# Patient Record
Sex: Male | Born: 1945
Health system: Southern US, Community
[De-identification: ages and names within clinical notes are randomized; demographics above are authoritative.]

## PROBLEM LIST (undated history)

## (undated) DIAGNOSIS — I739 Peripheral vascular disease, unspecified: Secondary | ICD-10-CM

## (undated) DIAGNOSIS — J189 Pneumonia, unspecified organism: Secondary | ICD-10-CM

## (undated) DIAGNOSIS — N400 Enlarged prostate without lower urinary tract symptoms: Secondary | ICD-10-CM

## (undated) DIAGNOSIS — R911 Solitary pulmonary nodule: Secondary | ICD-10-CM

## (undated) DIAGNOSIS — I714 Abdominal aortic aneurysm, without rupture, unspecified: Secondary | ICD-10-CM

## (undated) DIAGNOSIS — Z8719 Personal history of other diseases of the digestive system: Secondary | ICD-10-CM

## (undated) DIAGNOSIS — R51 Headache: Secondary | ICD-10-CM

## (undated) DIAGNOSIS — K219 Gastro-esophageal reflux disease without esophagitis: Secondary | ICD-10-CM

## (undated) DIAGNOSIS — Z972 Presence of dental prosthetic device (complete) (partial): Secondary | ICD-10-CM

## (undated) DIAGNOSIS — G458 Other transient cerebral ischemic attacks and related syndromes: Secondary | ICD-10-CM

## (undated) DIAGNOSIS — C801 Malignant (primary) neoplasm, unspecified: Secondary | ICD-10-CM

## (undated) DIAGNOSIS — F419 Anxiety disorder, unspecified: Secondary | ICD-10-CM

## (undated) DIAGNOSIS — J439 Emphysema, unspecified: Secondary | ICD-10-CM

## (undated) DIAGNOSIS — Z8669 Personal history of other diseases of the nervous system and sense organs: Secondary | ICD-10-CM

## (undated) DIAGNOSIS — H269 Unspecified cataract: Secondary | ICD-10-CM

## (undated) DIAGNOSIS — M549 Dorsalgia, unspecified: Secondary | ICD-10-CM

## (undated) DIAGNOSIS — M109 Gout, unspecified: Secondary | ICD-10-CM

## (undated) DIAGNOSIS — R011 Cardiac murmur, unspecified: Secondary | ICD-10-CM

## (undated) DIAGNOSIS — R590 Localized enlarged lymph nodes: Secondary | ICD-10-CM

## (undated) DIAGNOSIS — J309 Allergic rhinitis, unspecified: Secondary | ICD-10-CM

## (undated) DIAGNOSIS — R519 Headache, unspecified: Secondary | ICD-10-CM

## (undated) DIAGNOSIS — I10 Essential (primary) hypertension: Secondary | ICD-10-CM

## (undated) DIAGNOSIS — J302 Other seasonal allergic rhinitis: Secondary | ICD-10-CM

## (undated) DIAGNOSIS — I2510 Atherosclerotic heart disease of native coronary artery without angina pectoris: Secondary | ICD-10-CM

## (undated) DIAGNOSIS — E785 Hyperlipidemia, unspecified: Secondary | ICD-10-CM

## (undated) HISTORY — PX: ILIAC ARTERY STENT: SHX1786

## (undated) HISTORY — DX: Allergic rhinitis, unspecified: J30.9

## (undated) HISTORY — DX: Atherosclerotic heart disease of native coronary artery without angina pectoris: I25.10

## (undated) HISTORY — PX: MULTIPLE TOOTH EXTRACTIONS: SHX2053

## (undated) HISTORY — DX: Personal history of other diseases of the nervous system and sense organs: Z86.69

## (undated) HISTORY — DX: Hyperlipidemia, unspecified: E78.5

## (undated) HISTORY — DX: Gout, unspecified: M10.9

## (undated) HISTORY — DX: Anxiety disorder, unspecified: F41.9

## (undated) HISTORY — DX: Benign prostatic hyperplasia without lower urinary tract symptoms: N40.0

## (undated) HISTORY — DX: Peripheral vascular disease, unspecified: I73.9

## (undated) HISTORY — DX: Other seasonal allergic rhinitis: J30.2

## (undated) HISTORY — PX: CARDIAC CATHETERIZATION: SHX172

## (undated) HISTORY — DX: Essential (primary) hypertension: I10

## (undated) HISTORY — DX: Abdominal aortic aneurysm, without rupture: I71.4

## (undated) HISTORY — PX: COLONOSCOPY W/ BIOPSIES AND POLYPECTOMY: SHX1376

## (undated) HISTORY — DX: Abdominal aortic aneurysm, without rupture, unspecified: I71.40

## (undated) HISTORY — DX: Dorsalgia, unspecified: M54.9

---

## 2010-10-06 LAB — LIPID PANEL
Cholesterol: 218 mg/dL — AB (ref 0–200)
HDL: 6 mg/dL — AB (ref 35–70)
LDL Cholesterol: 156 mg/dL
LDl/HDL Ratio: 5.7
Triglycerides: 119 mg/dL (ref 40–160)

## 2010-10-17 ENCOUNTER — Other Ambulatory Visit: Payer: Self-pay | Admitting: Internal Medicine

## 2010-10-17 DIAGNOSIS — R0989 Other specified symptoms and signs involving the circulatory and respiratory systems: Secondary | ICD-10-CM

## 2010-10-24 ENCOUNTER — Ambulatory Visit
Admission: RE | Admit: 2010-10-24 | Discharge: 2010-10-24 | Disposition: A | Discharge status: Home / Self Care | Payer: Medicare Other | Source: Ambulatory Visit | Attending: Internal Medicine | Admitting: Internal Medicine

## 2010-10-24 DIAGNOSIS — R0989 Other specified symptoms and signs involving the circulatory and respiratory systems: Secondary | ICD-10-CM

## 2011-02-02 ENCOUNTER — Ambulatory Visit (INDEPENDENT_AMBULATORY_CARE_PROVIDER_SITE_OTHER): Payer: Medicare Other

## 2011-02-02 DIAGNOSIS — I10 Essential (primary) hypertension: Secondary | ICD-10-CM

## 2011-02-02 DIAGNOSIS — K141 Geographic tongue: Secondary | ICD-10-CM

## 2011-02-03 LAB — BASIC METABOLIC PANEL: Sodium: 130 mmol/L — AB (ref 137–147)

## 2011-04-04 ENCOUNTER — Ambulatory Visit (INDEPENDENT_AMBULATORY_CARE_PROVIDER_SITE_OTHER): Payer: Medicare Other | Admitting: Family Medicine

## 2011-04-04 DIAGNOSIS — E871 Hypo-osmolality and hyponatremia: Secondary | ICD-10-CM

## 2011-04-04 DIAGNOSIS — R319 Hematuria, unspecified: Secondary | ICD-10-CM

## 2011-04-04 DIAGNOSIS — I10 Essential (primary) hypertension: Secondary | ICD-10-CM | POA: Diagnosis not present

## 2011-04-04 DIAGNOSIS — R350 Frequency of micturition: Secondary | ICD-10-CM

## 2011-04-04 LAB — POCT URINALYSIS DIPSTICK
Ketones, UA: NEGATIVE
Leukocytes, UA: NEGATIVE
Nitrite, UA: NEGATIVE
Protein, UA: NEGATIVE
Urobilinogen, UA: 1
pH, UA: 7.5

## 2011-04-04 MED ORDER — CHLORTHALIDONE 25 MG PO TABS: 25.0000 mg | ORAL_TABLET | Freq: Every day | ORAL | Status: DC

## 2011-04-04 MED ORDER — METOPROLOL TARTRATE 25 MG PO TABS: 25.0000 mg | ORAL_TABLET | Freq: Two times a day (BID) | ORAL | Status: DC

## 2011-04-04 NOTE — Progress Notes (Signed)
  Subjective:    Patient ID: Seth [name], male    DOB: Jun 26, 1945, 66 y.o.   MRN: [id number]  HPI Seth [name] is a 66 y.o. male Hx HTN - last OV 171/100, 178/98 on recheck. Prior on chlorthalidone 25mg  QD, added metoprolol 25mg  bid. Plan to recheck in 2-4 weeks. On last dose of meds today.  No missed doses.  Outside bp's 170-190's, then 140's/80's at times. 6-8 mugs coffee per day. Drinks 8-10 glasses water per day. Felt better with new medicine, increased coffee intake since feeling better.  No chest pain,   Hyponatremia - Na 130 on 02/02/11.  Episodic migraine headaches with excess cheese intake.  No changes recently.  Review of Systems  Respiratory: Negative for chest tightness and shortness of breath.   Cardiovascular: Negative for chest pain, palpitations and leg swelling.  Genitourinary: Positive for frequency and difficulty urinating. Negative for dysuria, urgency, hematuria, flank pain, decreased urine volume, penile pain and testicular pain.       Frequency for 1-2 weeks, but drinking more liquid and caffeine.  Trouble starting urine stream for few weeks - only at times.  Neurological: Negative for syncope, speech difficulty, weakness and light-headedness.       Objective:   Physical Exam  Constitutional: He is oriented to person, place, and time. He appears well-developed and well-nourished.  HENT:  Head: Normocephalic and atraumatic.  Cardiovascular: Normal rate, regular rhythm, normal heart sounds and intact distal pulses.   No murmur heard.      No bruit   Pulmonary/Chest: Effort normal and breath sounds normal.  Abdominal: Soft. Bowel sounds are normal. He exhibits no distension. There is no tenderness.  Neurological: He is alert and oriented to person, place, and time.  Skin: Skin is warm and dry.  Psychiatric: He has a normal mood and affect. His behavior is normal. Thought content normal.       Results for orders placed in visit on 04/04/11  POCT URINALYSIS DIPSTICK        Component Value Range   Color, UA yellow     Clarity, UA clear     Glucose, UA neg     Bilirubin, UA neg     Ketones, UA neg     Spec Grav, UA 1.010     Blood, UA trace-intact     pH, UA 7.5     Protein, UA neg     Urobilinogen, UA 1.0     Nitrite, UA neg     Leukocytes, UA Negative      Assessment & Plan:  Seth [name] is a 66 y.o. male 1. HTN (hypertension)  Basic metabolic panel  2. Urinary frequency  PSA    Suspect uncontrolled HTN in part due to excessive caffeine intake with up to 8 mugs of coffee per day, as when on lower amounts of coffee, BP in 130-140 systolics. Continue same dose meds for now, cut back coffee to no more than 2 cups per day, and continue to record home BP readings.Recheck in next 1 month.  Urinary frequency - likely in part due to caffeine as well.  However, trace blood on U/A.  Check PSA, and plan on follow up at next OV.  Hyponatremia - recheck BMP today.

## 2011-04-04 NOTE — Patient Instructions (Signed)
Cut back caffeine to no more than 2 cups per day.  Keep a record of your blood pressures out of office.  Your lab tests, including the prostate and electrolyte tests should be back in the next 10 days. Return to the clinic or go to the nearest emergency room if any of your symptoms worsen or new symptoms occur.

## 2011-04-05 LAB — BASIC METABOLIC PANEL
BUN: 6 mg/dL (ref 6–23)
Calcium: 9.8 mg/dL (ref 8.4–10.5)
Creat: 0.96 mg/dL (ref 0.50–1.35)
Glucose, Bld: 99 mg/dL (ref 70–99)
Potassium: 3.7 mEq/L (ref 3.5–5.3)

## 2011-04-26 ENCOUNTER — Encounter: Payer: Self-pay | Admitting: *Deleted

## 2011-04-26 DIAGNOSIS — J302 Other seasonal allergic rhinitis: Secondary | ICD-10-CM

## 2011-04-26 DIAGNOSIS — I10 Essential (primary) hypertension: Secondary | ICD-10-CM

## 2011-04-26 DIAGNOSIS — Z8669 Personal history of other diseases of the nervous system and sense organs: Secondary | ICD-10-CM

## 2011-04-27 ENCOUNTER — Encounter: Payer: Self-pay | Admitting: Family Medicine

## 2011-04-27 ENCOUNTER — Ambulatory Visit (INDEPENDENT_AMBULATORY_CARE_PROVIDER_SITE_OTHER): Payer: Medicare Other | Admitting: Family Medicine

## 2011-04-27 VITALS — BP 162/95 | HR 85 | Temp 96.6°F | Resp 16 | Ht 72.0 in | Wt 181.0 lb

## 2011-04-27 DIAGNOSIS — R350 Frequency of micturition: Secondary | ICD-10-CM | POA: Diagnosis not present

## 2011-04-27 DIAGNOSIS — E871 Hypo-osmolality and hyponatremia: Secondary | ICD-10-CM

## 2011-04-27 DIAGNOSIS — I10 Essential (primary) hypertension: Secondary | ICD-10-CM | POA: Diagnosis not present

## 2011-04-27 DIAGNOSIS — R319 Hematuria, unspecified: Secondary | ICD-10-CM

## 2011-04-27 MED ORDER — METOPROLOL TARTRATE 50 MG PO TABS: 50.0000 mg | ORAL_TABLET | Freq: Two times a day (BID) | ORAL | Status: DC

## 2011-04-27 NOTE — Patient Instructions (Signed)
Change metoprolol to 50 mg twice per day.  Recheck in 3 weeks.  If any increased dizziness,, return to clinic right away.

## 2011-04-27 NOTE — Progress Notes (Signed)
  Subjective:    Patient ID: Seth [name], male    DOB: 01-05-1946, 66 y.o.   MRN: [id number]  HPI  Hx HTN - 2 prior OV 171/100, 178/98 on recheck. Prior on chlorthalidone 25mg  QD, added metoprolol 25mg  bid. Outside bp's 170-190's, then 140's/80's at times.Prior  6-8 mugs coffee per day. Drinks 8-10 glasses water per day. Felt better with new medicine, increased coffee intake since feeling better. Last OV suspected uncontrolled HTN in part due to excessive caffeine intake with up to 8 mugs of coffee per day, as when on lower amounts of coffee, BP in 130-140 systolics. Continued same dose meds, and to cut back coffee to no more than 2 cups per day, and continue to record home BP readings.  Since last OV - BP's: outside numbers: 180-190/100.  Drinking 3-4 cups coffee per day now, and down to 15cigs form 2ppd smoking.  Urinary frequency - likely in part due to caffeine as well.  However, trace blood on U/A.  Check PSA, and plan on follow up at next OV.  Hyponatremia - Na 130 on 02/02/11.  Na 133 on 04/04/11.   No new symptoms.  Review of Systems  Respiratory: Negative for cough, chest tightness and shortness of breath.   Cardiovascular: Negative for chest pain and palpitations.  Genitourinary: Positive for frequency.       Less frequency than last ov.  Uop 9 times per day, but less than prior.    Neurological: Negative for dizziness and light-headedness.       One episode of dizziness with getting up quick - no increase.        Objective:   Physical Exam  Constitutional: He is oriented to person, place, and time. He appears well-developed and well-nourished.  HENT:  Head: Normocephalic and atraumatic.  Neck: No JVD present. No thyromegaly present.  Cardiovascular: Normal rate, regular rhythm, normal heart sounds and intact distal pulses.   No murmur heard. Pulmonary/Chest: Effort normal and breath sounds normal. He has no wheezes. He has no rales.  Neurological: He is alert and oriented to  person, place, and time.  Skin: Skin is warm and dry.  Psychiatric: He has a normal mood and affect.          Assessment & Plan:  Seth [name] is a 66 y.o. male 1. HTN (hypertension)  Basic metabolic panel  2. Hyponatremia  Basic metabolic panel  3. Urinary frequency  Basic metabolic panel, PSA  4. Hematuria  PSA   HTN - still uncontrolled.  Increase metoprolol to 50 mg BID - orthostatic precautions reviewed. Recheck BMP.  Urinary frequency - will check PSA with BMP today.  On diuretic for HTN, may be contributor to frequency..  Less frequent than last ov, with less caffeine intake. Recheck next few weeks, with U/a to decide further workup.  Hyponatremia - improved last ov.  Recheck BMP as above, and U/a in few weeks.  Trace hematuria - prior ov, no new urinary symptoms. Check psa, repeat urine next ov in few weeks.

## 2011-04-28 LAB — BASIC METABOLIC PANEL
BUN: 11 mg/dL (ref 6–23)
CO2: 29 mEq/L (ref 19–32)
Calcium: 9.6 mg/dL (ref 8.4–10.5)
Chloride: 96 mEq/L (ref 96–112)
Creat: 0.95 mg/dL (ref 0.50–1.35)

## 2011-05-04 ENCOUNTER — Other Ambulatory Visit: Payer: Self-pay | Admitting: Family Medicine

## 2011-05-18 ENCOUNTER — Encounter: Payer: Self-pay | Admitting: Family Medicine

## 2011-05-18 ENCOUNTER — Ambulatory Visit (INDEPENDENT_AMBULATORY_CARE_PROVIDER_SITE_OTHER): Payer: Medicare Other | Admitting: Family Medicine

## 2011-05-18 DIAGNOSIS — R350 Frequency of micturition: Secondary | ICD-10-CM

## 2011-05-18 DIAGNOSIS — R319 Hematuria, unspecified: Secondary | ICD-10-CM | POA: Diagnosis not present

## 2011-05-18 DIAGNOSIS — I10 Essential (primary) hypertension: Secondary | ICD-10-CM

## 2011-05-18 LAB — POCT UA - MICROSCOPIC ONLY
Casts, Ur, LPF, POC: NEGATIVE
Crystals, Ur, HPF, POC: NEGATIVE
Mucus, UA: NEGATIVE
Yeast, UA: NEGATIVE

## 2011-05-18 LAB — POCT URINALYSIS DIPSTICK
Ketones, UA: NEGATIVE
Protein, UA: NEGATIVE
Spec Grav, UA: 1.015
pH, UA: 7.5

## 2011-05-18 NOTE — Progress Notes (Signed)
Subjective:    Patient ID: Seth [name], male    DOB: [birth year], 66 y.o.   MRN: [id number]  HPI Seth [name] is a 66 y.o. male   HTN - still uncontrolled at last ov.  No recent outside BP's, but feels better,  Increased metoprolol to 50 mg BID at last.  No new problems with side effects with this med.  Had sinus congestion once and dizzy, but not since.  BMP wnl on 3/22 - no further hyponatremia.  Urinary frequency - PSA 2.93 at 04/27/11.  UOP 9-12 times per day.  3-4 coffee cups per day, prior increased use.. On diuretic for HTN.  LNo change in frequency since last OV.  Trace hematuria on prior U/a.  Last UTI 10 years ago.   Review of Systems  Constitutional: Negative for fatigue and unexpected weight change.  Eyes: Negative for visual disturbance.  Respiratory: Negative for cough, chest tightness and shortness of breath.   Cardiovascular: Negative for chest pain, palpitations and leg swelling.  Gastrointestinal: Negative for abdominal pain and blood in stool.  Genitourinary: Positive for frequency. Negative for hematuria and difficulty urinating.  Neurological: Negative for dizziness, light-headedness and headaches.       Objective:   Physical Exam  Constitutional: He is oriented to person, place, and time. He appears well-developed and well-nourished.  HENT:  Head: Normocephalic and atraumatic.  Eyes: EOM are normal. Pupils are equal, round, and reactive to light.  Neck: No JVD present. Carotid bruit is not present.  Cardiovascular: Normal rate, regular rhythm and normal heart sounds.   No murmur heard. Pulmonary/Chest: Effort normal and breath sounds normal. He has no rales.  Abdominal: Soft. There is no tenderness.  Musculoskeletal: He exhibits no edema.  Neurological: He is alert and oriented to person, place, and time.  Skin: Skin is warm and dry.  Psychiatric: He has a normal mood and affect.   Results for orders placed in visit on 05/18/11  POCT URINALYSIS DIPSTICK   Component Value Range   Color, UA yellow     Clarity, UA clear     Glucose, UA neg     Bilirubin, UA neg     Ketones, UA neg     Spec Grav, UA 1.015     Blood, UA trace     pH, UA 7.5     Protein, UA neg     Urobilinogen, UA 0.2     Nitrite, UA neg     Leukocytes, UA Negative    POCT UA - MICROSCOPIC ONLY      Component Value Range   WBC, Ur, HPF, POC neg     RBC, urine, microscopic 0-3     Bacteria, U Microscopic neg     Mucus, UA neg     Epithelial cells, urine per micros 0-1     Crystals, Ur, HPF, POC neg     Casts, Ur, LPF, POC neg     Yeast, UA neg            Assessment & Plan:  Seth [name] is a 66 y.o. male 1. Hematuria  POCT urinalysis dipstick, POCT UA - Microscopic Only  2. Urinary frequency  POCT urinalysis dipstick, POCT UA - Microscopic Only  3. HTN (hypertension)     HTN - improved, but still high here - check outside BP's and bring record to ov in next 3-4 weeks.  Continue same doses for now.  Urinary frequency with trace hematuria, but no apparent infection.  PSA  in normal range - suspect component of caffeine and diuretic with frequency.  Persistent trace hematuria - refer to urology - no new meds for now.  Recheck 3-4 weeks, sooner if any worsening of above.

## 2011-05-18 NOTE — Patient Instructions (Signed)
We are referring you to a urologist to evaluate the frequency and trace blood in your urine.  Return to the clinic or go to the nearest emergency room if any of your symptoms worsen or new symptoms occur.  Check your blood pressures outside of office and return with a record of these in the next 1 month. Continue same doses of meds for now.

## 2011-06-06 ENCOUNTER — Other Ambulatory Visit: Payer: Self-pay | Admitting: Physician Assistant

## 2011-06-07 DIAGNOSIS — R3129 Other microscopic hematuria: Secondary | ICD-10-CM | POA: Diagnosis not present

## 2011-06-07 DIAGNOSIS — R350 Frequency of micturition: Secondary | ICD-10-CM | POA: Diagnosis not present

## 2011-06-14 DIAGNOSIS — R3129 Other microscopic hematuria: Secondary | ICD-10-CM | POA: Diagnosis not present

## 2011-06-14 DIAGNOSIS — N289 Disorder of kidney and ureter, unspecified: Secondary | ICD-10-CM | POA: Diagnosis not present

## 2011-06-14 DIAGNOSIS — I714 Abdominal aortic aneurysm, without rupture: Secondary | ICD-10-CM | POA: Diagnosis not present

## 2011-06-18 DIAGNOSIS — R3129 Other microscopic hematuria: Secondary | ICD-10-CM | POA: Diagnosis not present

## 2011-06-22 ENCOUNTER — Encounter: Payer: Self-pay | Admitting: Family Medicine

## 2011-06-22 ENCOUNTER — Ambulatory Visit (INDEPENDENT_AMBULATORY_CARE_PROVIDER_SITE_OTHER): Payer: Medicare Other | Admitting: Family Medicine

## 2011-06-22 VITALS — BP 160/90 | HR 80 | Temp 97.8°F | Resp 16 | Ht 72.0 in | Wt 184.4 lb

## 2011-06-22 DIAGNOSIS — I10 Essential (primary) hypertension: Secondary | ICD-10-CM

## 2011-06-22 DIAGNOSIS — D509 Iron deficiency anemia, unspecified: Secondary | ICD-10-CM | POA: Diagnosis not present

## 2011-06-22 DIAGNOSIS — F17200 Nicotine dependence, unspecified, uncomplicated: Secondary | ICD-10-CM

## 2011-06-22 DIAGNOSIS — Z720 Tobacco use: Secondary | ICD-10-CM

## 2011-06-22 DIAGNOSIS — R350 Frequency of micturition: Secondary | ICD-10-CM

## 2011-06-22 MED ORDER — LISINOPRIL 5 MG PO TABS: 5.0000 mg | ORAL_TABLET | Freq: Every day | ORAL | Status: DC

## 2011-06-22 MED ORDER — CHLORTHALIDONE 25 MG PO TABS: 25.0000 mg | ORAL_TABLET | Freq: Every day | ORAL | Status: DC

## 2011-06-22 MED ORDER — METOPROLOL TARTRATE 50 MG PO TABS: 50.0000 mg | ORAL_TABLET | Freq: Two times a day (BID) | ORAL | Status: DC

## 2011-06-22 NOTE — Patient Instructions (Signed)
Start lisinopril 1 per day.  Keep record of home blood pressures.  Recheck in 6 weeks.

## 2011-06-22 NOTE — Progress Notes (Signed)
  Subjective:    Patient ID: Seth [name], male    DOB: 1945/10/25, 66 y.o.   MRN: [id number]  HPI Seth [name] is a 66 y.o. male  HTN - outside BP's 150's over 80's.  One time was 125/80.  Usually in 150's.    Hematuria with urinary frequency - referred to urology - told that the kidneys and bladder were fine, but told had aneurysm, but not concerning.  No new meds or follow up needed.   Records pending, but had ultrasound of abdomen in 9/12:  IMPRESSION:  1. Borderline/mild mid abdominal aortic dilatation/aneurysm.  2. Otherwise, normal abdominal ultrasound for age.  Is cutting back on cigarettes.     Review of Systems  Constitutional: Negative for fatigue and unexpected weight change.  Eyes: Negative for visual disturbance.  Respiratory: Negative for cough, chest tightness and shortness of breath.   Cardiovascular: Negative for chest pain, palpitations and leg swelling.  Gastrointestinal: Negative for abdominal pain and blood in stool.  Neurological: Negative for dizziness, light-headedness and headaches.  Psychiatric/Behavioral:       Hard to relax at times.  Used to do yoga.  Denies depression/SI.         Objective:   Physical Exam  Constitutional: He is oriented to person, place, and time. He appears well-developed and well-nourished.  HENT:  Head: Normocephalic and atraumatic.  Eyes: EOM are normal. Pupils are equal, round, and reactive to light.  Neck: No JVD present. Carotid bruit is not present.  Cardiovascular: Normal rate, regular rhythm and normal heart sounds.   No murmur heard. Pulmonary/Chest: Effort normal and breath sounds normal. He has no rales.  Abdominal: Normal appearance. He exhibits pulsatile midline mass. There is no tenderness. There is no CVA tenderness.       Able to palpate abdominal pulse but no lateral deviation.  Musculoskeletal: He exhibits no edema.  Neurological: He is alert and oriented to person, place, and time.  Skin: Skin is warm and dry.    Psychiatric: He has a normal mood and affect.       Assessment & Plan:  Seth [name] is a 66 y.o. male HTN - uncontrolled with tobacco abuse and recent diagnosis of abdominal aneurysm, prior mild.  Will obtain records to decide if further workup/imaging needed.  Reinforced need for strict BP control and cessation of tobacco.  Continue metoprolol 50mg  BID, chlorthalidone 25mg  QD, add lisinopril 5mg  QD - may need higher dose. If new cough - call or rtc.  SED.  orthostatic precautions.  Anxiety/stress.  Stress mgt and relaxation techniques.discussed.  Can restart yoga, can read book "Anxiety for Dummies".  Discuss in further detail at next ov.  Urinary frequency - likely med related per urology.  No new workup needed at this point.  Tobacco abuse - continue to cut back.  If needs help with cessation, consider Chantix.  REcheck next 6 weeks.  Sooner if any new se's or new sx's.  Understanding expressed.

## 2011-08-03 ENCOUNTER — Encounter: Payer: Self-pay | Admitting: Family Medicine

## 2011-08-03 ENCOUNTER — Ambulatory Visit (INDEPENDENT_AMBULATORY_CARE_PROVIDER_SITE_OTHER): Payer: Medicare Other | Admitting: Family Medicine

## 2011-08-03 VITALS — BP 154/83 | HR 87 | Temp 96.4°F | Resp 16 | Ht 72.0 in | Wt 185.2 lb

## 2011-08-03 DIAGNOSIS — F411 Generalized anxiety disorder: Secondary | ICD-10-CM

## 2011-08-03 DIAGNOSIS — I10 Essential (primary) hypertension: Secondary | ICD-10-CM | POA: Diagnosis not present

## 2011-08-03 DIAGNOSIS — F419 Anxiety disorder, unspecified: Secondary | ICD-10-CM

## 2011-08-03 LAB — BASIC METABOLIC PANEL
BUN: 11 mg/dL (ref 6–23)
Chloride: 90 mEq/L — ABNORMAL LOW (ref 96–112)
Potassium: 3.9 mEq/L (ref 3.5–5.3)
Sodium: 128 mEq/L — ABNORMAL LOW (ref 135–145)

## 2011-08-03 MED ORDER — LISINOPRIL 10 MG PO TABS: 10.0000 mg | ORAL_TABLET | Freq: Every day | ORAL | Status: DC

## 2011-08-03 MED ORDER — METOPROLOL TARTRATE 50 MG PO TABS: 50.0000 mg | ORAL_TABLET | Freq: Two times a day (BID) | ORAL | Status: DC

## 2011-08-03 MED ORDER — CHLORTHALIDONE 25 MG PO TABS: 25.0000 mg | ORAL_TABLET | Freq: Every day | ORAL | Status: DC

## 2011-08-03 NOTE — Progress Notes (Signed)
Subjective:    Patient ID: Seth [name], male    DOB: 09/21/1945, 66 y.o.   MRN: [id number]  HPI Seth [name] is a 66 y.o. male See prior ov's - 06/22/11 Continued metoprolol 50mg  BID, chlorthalidone 25mg  QD, added lisinopril 5mg  QD - Outside blood pressures -141-153/80-86. Feels better.  No cough or other new side effects of meds.  Dx with possible small aortic aneurysm at urology - but records not available at present.   Stress/anxiety - see last ov.  Trying breathing and yoga techniques - seems to be helping some.  Has not picked up Anxiety for Dummies book. Denies depression/SI  Review of Systems  Constitutional: Negative for fatigue and unexpected weight change.  Eyes: Negative for visual disturbance.  Respiratory: Negative for cough, chest tightness and shortness of breath.   Cardiovascular: Negative for chest pain, palpitations and leg swelling.  Gastrointestinal: Negative for abdominal pain and blood in stool.  Neurological: Positive for dizziness. Negative for light-headedness and headaches.       "pre-dizziness - thinks may be sinuses"  Psychiatric/Behavioral: Positive for disturbed wake/sleep cycle. Negative for suicidal ideas. The patient is nervous/anxious.        Sleeps 6 hours per night. Usual amount for him. Sometimes more if tired.  Trouble staying asleep at times, not anxious or nervous - just awake.          Objective:   Physical Exam  Constitutional: He is oriented to person, place, and time. He appears well-developed and well-nourished.  HENT:  Head: Normocephalic and atraumatic.  Eyes: EOM are normal. Pupils are equal, round, and reactive to light.  Neck: No JVD present. Carotid bruit is not present.  Cardiovascular: Normal rate, regular rhythm and normal heart sounds.   No murmur heard. Pulmonary/Chest: Effort normal and breath sounds normal. He has no rales.  Abdominal: Soft. Normal appearance. He exhibits no abdominal bruit, no pulsatile midline mass and no mass.  There is no tenderness. There is no CVA tenderness.  Musculoskeletal: He exhibits no edema.  Neurological: He is alert and oriented to person, place, and time.  Skin: Skin is warm and dry.  Psychiatric: He has a normal mood and affect.     Abdominal Ultrasound report - 10/17/10: excerpt on aneurysm:  Abdominal aorta: Atherosclerotic irregularity within. Mild  aneurysmal dilatation of the mid abdominal aorta. This measures  maximally 3.0 cm on image 70 longitudinal. 3.2 cm transverse on  image 9. Iliacs appear grossly normal. The distal abdominal aorta  is normal in caliber, at 2.1 cm.       Assessment & Plan:   Seth [name] is a 66 y.o. male  HTN - improving control, but not yet at goal. Increase lisinopril to 10mg  qd.  Cont other meds at current doses and check home bp's.   Abdominal aortic aneurysm - small, asymptomatic.  No indication for repair at this size (less than 5.5cm), but will plant to recheck ultrasound in 3 months to watch for expansion.  If greater than 10mm per year, would then consider repair.  Will need to quit smoking as primary intervention, and for cardiovascular risk reduction - plan to start asa qd, as well as possible statin as LDL elevated last year - due for another lipid panel.  This can be at next office visit in next 6 weeks. As far as exercise - light to moderate is ok, but will need to avoid any heavy lifting causing valsalva and subsequent rise in pressure.  Anxiety - stable.  Declined other meds at this point,  Has Klonopin prn if needed.  No recent use needed.  Will look into book Anxiety for Dummies. Exercise recommended.- low intensity for now - walking, or in pool  (hx of back problems).

## 2011-08-03 NOTE — Patient Instructions (Addendum)
Increase lisinopril to 10mg  each day (take 2 of 5mg  pills until out, then fill new 10mg  prescription).  If dizziness increases - return to previous dose and return to clinic. Low intensity exercise only until we determine if further imaging or follow up necessary for the aneurysm.  Return to the clinic or go to the nearest emergency room if any of your symptoms worsen or new symptoms occur. Can try over the counter claritin for allergies if needed.  No decongestants.  If anxiety is worse - return to clinic.

## 2011-08-20 ENCOUNTER — Other Ambulatory Visit: Payer: Self-pay | Admitting: Family Medicine

## 2011-08-20 ENCOUNTER — Other Ambulatory Visit: Payer: Medicare Other

## 2011-08-20 DIAGNOSIS — I10 Essential (primary) hypertension: Secondary | ICD-10-CM

## 2011-08-20 LAB — BASIC METABOLIC PANEL
BUN: 11 mg/dL (ref 6–23)
CO2: 26 mEq/L (ref 19–32)
Glucose, Bld: 96 mg/dL (ref 70–99)
Potassium: 4.1 mEq/L (ref 3.5–5.3)
Sodium: 130 mEq/L — ABNORMAL LOW (ref 135–145)

## 2011-09-14 ENCOUNTER — Encounter: Payer: Self-pay | Admitting: Family Medicine

## 2011-09-14 ENCOUNTER — Ambulatory Visit (INDEPENDENT_AMBULATORY_CARE_PROVIDER_SITE_OTHER): Payer: Medicare Other | Admitting: Family Medicine

## 2011-09-14 VITALS — BP 138/74 | HR 74 | Temp 97.8°F | Resp 16 | Ht 72.0 in | Wt 188.0 lb

## 2011-09-14 DIAGNOSIS — E871 Hypo-osmolality and hyponatremia: Secondary | ICD-10-CM | POA: Diagnosis not present

## 2011-09-14 DIAGNOSIS — I10 Essential (primary) hypertension: Secondary | ICD-10-CM

## 2011-09-14 DIAGNOSIS — I739 Peripheral vascular disease, unspecified: Secondary | ICD-10-CM

## 2011-09-14 DIAGNOSIS — G629 Polyneuropathy, unspecified: Secondary | ICD-10-CM

## 2011-09-14 DIAGNOSIS — M79669 Pain in unspecified lower leg: Secondary | ICD-10-CM

## 2011-09-14 DIAGNOSIS — F411 Generalized anxiety disorder: Secondary | ICD-10-CM | POA: Diagnosis not present

## 2011-09-14 DIAGNOSIS — F419 Anxiety disorder, unspecified: Secondary | ICD-10-CM

## 2011-09-14 LAB — BASIC METABOLIC PANEL
BUN: 14 mg/dL (ref 6–23)
CO2: 27 mEq/L (ref 19–32)
Calcium: 9.9 mg/dL (ref 8.4–10.5)
Creat: 1.1 mg/dL (ref 0.50–1.35)
Glucose, Bld: 97 mg/dL (ref 70–99)
Sodium: 131 mEq/L — ABNORMAL LOW (ref 135–145)

## 2011-09-14 NOTE — Patient Instructions (Signed)
Keep a record of your blood pressures outside of the office and bring them to the next office visit. Your should receive a call or letter about your lab results within the next week to 10 days.   Recheck in next few weeks.  Return to the clinic or go to the nearest emergency room if any of your symptoms worsen or new symptoms occur.

## 2011-09-14 NOTE — Progress Notes (Signed)
Subjective:    Patient ID: Seth [name], male    DOB: 25-May-1945, 66 y.o.   MRN: [id number]  HPI Seth [name] is a 66 y.o. male HTN - improving control, but not yet at goal at last ov. Increased lisinopril to 10mg  qd at 08/03/11 ov.  Continued  other meds at current doses and check home bp's. Sodium 130, creat 0.99 on last labs 08/20/11. Here for follow up. Has not checked outside blood pressures. Occasional feeling of "predizziness" - feels like may become dizzy, but no actual chest pain.  2 episodes past 6 weeks. Sometimes fatigue after dinner at times. Only 3-4 cups coffee each day.  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  Seen by neurosurgeon in [place], in 1996?Marland Kitchen  No recent eval. Hx of congenital back problem? Seen by urgent care in [place].   -  Feeling more tightness in calves with walking distances. There for years, but worsening over the years.  Cutting back on smoking.    Abdominal aortic aneurysm  -Abdominal Ultrasound report - 10/17/10: excerpt on aneurysm:  Abdominal aorta: Atherosclerotic irregularity within. Mild aneurysmal dilatation of the mid abdominal aorta. This measures maximally 3.0 cm on image 70 longitudinal. 3.2 cm transverse on  image 9. Iliacs appear grossly normal. The distal abdominal aorta is normal in caliber, at 2.1 cm.   see last ov. - small, asymptomatic.  No indication for repair at this size (less than 5.5cm), but will plant to recheck ultrasound in 3 months to watch for expansion.  If greater than 10mm per year, would then consider repair.  Will need to quit smoking as primary intervention, and for cardiovascular risk reduction - plan to start asa qd, as well as possible statin as LDL elevated last year - due for another lipid panel.  As far as exercise - light to moderate is ok, but will need to avoid any heavy lifting causing valsalva and subsequent rise in pressure.  Anxiety -  Has Klonopin prn if needed.  No recent use needed.  recommended book  Anxiety for Dummies. Downloaded it, but has not read it. Exercise recommended.- low intensity for now - walking, or in pool  (hx of back problems). Not bad past 6 weeks.  Not having to use klonopin.  Overall feels pretty good.    Review of Systems  Constitutional: Negative for fatigue (sleepy at night at times. ) and unexpected weight change.  Eyes: Negative for visual disturbance.  Respiratory: Negative for cough, chest tightness and shortness of breath.   Cardiovascular: Negative for chest pain, palpitations and leg swelling.  Gastrointestinal: Negative for abdominal pain and blood in stool.  Musculoskeletal: Positive for myalgias (calves - see hpi.).  Neurological: Negative for dizziness (see hpi. ), syncope, light-headedness and headaches.       Objective:   Physical Exam  Constitutional: He is oriented to person, place, and time. He appears well-developed and well-nourished.  HENT:  Head: Normocephalic and atraumatic.  Eyes: EOM are normal. Pupils are equal, round, and reactive to light.  Neck: No JVD present. Carotid bruit is not present.  Cardiovascular: Normal rate, regular rhythm and normal heart sounds.   No murmur heard. Pulmonary/Chest: Effort normal and breath sounds normal. He has no rales.  Abdominal: He exhibits pulsatile midline mass (palpable aortic pulse, but no lateral deviation. ).  Musculoskeletal: He exhibits no edema.  Neurological: He is alert and oriented to person, place, and time.  Skin: Skin is warm and dry.  Psychiatric: He has a normal mood and affect.        Assessment & Plan:  Seth [name] is a 66 y.o. male  Calf pain with walking, ? claudication vs underlying spinal stenosis? Need to obtain old records as not evaluated here. Strongly encourage tobacco cessation - and applauded on efforts cutting back, as this may be contributing if claudication.  Recheck at ov in few weeks to discuss prior workup/tx and possible abi's as progressive symptoms.    HTN  - improved control, but "pre dizziness".  Check outside BP's, especially during these times, and may need to adjust meds.  Orthostatic precautions. Plan on repeat ultrasound in next 1-2 months.   Hyponatremia - possibly med related - recheck today, and if persists, may need to adjust type of antihypertensive to see if this is the cause.   Hx small abdominal aortic aneurysm.  Asymptomatic.  Precautions again reviewed.  Anxiety - stable. No new changes.

## 2011-09-18 ENCOUNTER — Telehealth: Payer: Self-pay | Admitting: *Deleted

## 2011-09-18 NOTE — Telephone Encounter (Signed)
Called at 1155 spoke to patient, advised him Dr Channing Mutters does not have any records of his office visit. Patient will check to see if he can locate any information of seeing Dr Channing Mutters, but he said he saw this provider located in [place] in 1996 or 1997.

## 2011-10-05 ENCOUNTER — Ambulatory Visit: Payer: Medicare Other

## 2011-10-05 ENCOUNTER — Encounter: Payer: Self-pay | Admitting: Family Medicine

## 2011-10-05 ENCOUNTER — Ambulatory Visit (INDEPENDENT_AMBULATORY_CARE_PROVIDER_SITE_OTHER): Payer: Medicare Other | Admitting: Family Medicine

## 2011-10-05 VITALS — BP 130/80 | HR 88 | Temp 97.7°F | Resp 16 | Ht 72.0 in | Wt 188.0 lb

## 2011-10-05 DIAGNOSIS — R209 Unspecified disturbances of skin sensation: Secondary | ICD-10-CM | POA: Diagnosis not present

## 2011-10-05 DIAGNOSIS — I10 Essential (primary) hypertension: Secondary | ICD-10-CM

## 2011-10-05 DIAGNOSIS — M4317 Spondylolisthesis, lumbosacral region: Secondary | ICD-10-CM

## 2011-10-05 DIAGNOSIS — E870 Hyperosmolality and hypernatremia: Secondary | ICD-10-CM

## 2011-10-05 DIAGNOSIS — M545 Low back pain: Secondary | ICD-10-CM

## 2011-10-05 DIAGNOSIS — I739 Peripheral vascular disease, unspecified: Secondary | ICD-10-CM | POA: Diagnosis not present

## 2011-10-05 DIAGNOSIS — R202 Paresthesia of skin: Secondary | ICD-10-CM

## 2011-10-05 DIAGNOSIS — F17200 Nicotine dependence, unspecified, uncomplicated: Secondary | ICD-10-CM

## 2011-10-05 MED ORDER — METOPROLOL TARTRATE 50 MG PO TABS: ORAL_TABLET | ORAL | Status: DC

## 2011-10-05 NOTE — Progress Notes (Signed)
Subjective:    Patient ID: Seth [name], male    DOB: 12-26-1945, 66 y.o.   MRN: [id number]  HPI Seth [name] is a 66 y.o. male  Last ov 09/14/11.  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  Seen by neurosurgeon in [place], in 1996?Marland Kitchen  No recent eval. Hx of congenital back problem? Seen by urgent care in [place] in 1990's. .  At last ov  -  feeling more tightness in calves with walking distances. There for years, but worsening over the years.  Cutting back on smoking. Possible claudication vs underlying spinal stenosis discussed at last ov. Prior eval in [place], but unable to obtain records. Discussed continued cutting back on smoking for possible claudication component. Occasional soreness in back, not interfering with normal activity, but notes pain and numbness in calves both sides after walking 1000 feet.    HTN - improved control - see prior ov's but persistent hyponatremia.  possibly med related - recheck on 09/14/11 - 130 to 131.   Plan on repeat ultrasound in next 1 month for AAA - small abdominal aortic aneurysm. Asymptomatic.  Home blood pressure readings on his machine much higher than here.  195/94, 170/72. Only occasional "pre dizzy spell" - but no true dizziness, No loc.  Has not checked bp at those times. Did have an illness with diarrhea few days ago  -  Feels ok now, no further diarrhea.   Smoking 13-15 cigarettes per day.  No bowel or bladder incontinence, no saddle anesthesia, no lower extremity weakness, except as above.     Review of Systems  Constitutional: Negative for fatigue and unexpected weight change.  Eyes: Negative for visual disturbance.  Respiratory: Negative for cough, chest tightness and shortness of breath.   Cardiovascular: Negative for chest pain, palpitations and leg swelling.  Gastrointestinal: Positive for diarrhea (see hpi.). Negative for abdominal pain and blood in stool.  Neurological: Negative for dizziness, light-headedness and  headaches.       Objective:   Physical Exam  Constitutional: He is oriented to person, place, and time.  Neurological: He is alert and oriented to person, place, and time.  Skin:         [facility] reading (PRIMARY) by  Dr. Neva Seat: LS spine 2 view.  Spondylolisthesis of L5 on S1 with posterior collapse or narrowing of L5. DDD.   Official reading: IMPRESSION: No acute osseous abnormality. Grade II spondylolisthesis of L5 on S1 approximating 16 mm, due to bilateral L5 pars defects as noted on prior CT. Severe degenerative disc disease at L5-S1 and mild degenerative disc disease at L4-5.     Assessment & Plan:  Seth [name] is a 65 y.o. male  HTN - suspect machine is giving falsely elevated readings - bring machine to next office visit.  Change in meds as below.   Hyponatremia - trial off chlorthalidone. Increase metoprolol to 1 and 1/2 pills twice per day.  Orthostatic precautions discussed - if this occurs - check BP and return to prior dose of 50mg  BID. Recheck levels in approx 1 month.  If Na still low, consider ACE-I as cause.   Calf pain, numbness - claudication from PVD vs neuronal impingement/spinal stenosis at LS spine, with underlying listhesis on LS xray. May need MRI for further eval of possible spinal stenosis and ortho eval.   Chronic areas of decreased sensation in lower legs, but increase in activity related symptoms in calf. Decreased hair distribution suggestive of peripheral vascular dz.  Will checlk ABI's and discuss further at next OV.   Plan on recheck in 1 month.  Hx of AAA - can schedule repeat ultrasound then.

## 2011-10-05 NOTE — Patient Instructions (Signed)
Stop chlorthalidone.  Increase metoprolol 50mg  to 1 and 1/2 pills twice per day.  Watch for any lightheadedness or dizziness with this change.  Bring your blood pressure machine to the next office visit in 1 month.  We will call you to schedule the study of the arteries, and can talk more about the numbness and calf pain at next office visit, but continue to decrease smoking as this will likely help.   Return to the clinic or go to the nearest emergency room if any of your symptoms worsen or new symptoms occur.

## 2011-10-12 ENCOUNTER — Ambulatory Visit: Payer: Medicare Other | Admitting: Family Medicine

## 2011-10-23 ENCOUNTER — Telehealth: Payer: Self-pay

## 2011-10-23 ENCOUNTER — Ambulatory Visit (HOSPITAL_COMMUNITY)
Admission: RE | Admit: 2011-10-23 | Discharge: 2011-10-23 | Disposition: A | Discharge status: Home / Self Care | Payer: Medicare Other | Source: Ambulatory Visit | Attending: Family Medicine | Admitting: Family Medicine

## 2011-10-23 DIAGNOSIS — I743 Embolism and thrombosis of arteries of the lower extremities: Secondary | ICD-10-CM | POA: Insufficient documentation

## 2011-10-23 DIAGNOSIS — I739 Peripheral vascular disease, unspecified: Secondary | ICD-10-CM | POA: Diagnosis not present

## 2011-10-23 DIAGNOSIS — I10 Essential (primary) hypertension: Secondary | ICD-10-CM | POA: Insufficient documentation

## 2011-10-23 DIAGNOSIS — F17200 Nicotine dependence, unspecified, uncomplicated: Secondary | ICD-10-CM | POA: Insufficient documentation

## 2011-10-23 DIAGNOSIS — R252 Cramp and spasm: Secondary | ICD-10-CM | POA: Insufficient documentation

## 2011-10-23 NOTE — Progress Notes (Signed)
*  PRELIMINARY RESULTS* Vascular Ultrasound Lower extremity arterial duplex and ABI have been completed.    Right= Monophasic common femoral artery, suggestive of Aortoiliac disease. Occluded femoral artery with recanalized flow seen at the level of distal popliteal artery and dampened monophasic flow distally. Left= Monophasic common femoral artery, suggestive of Aortoiliac disease. Occluded femoral artery with recanalized flow seen at the level of distal femoral artery and dampened monophasic flow distally.   Left brachial pressure is dramatically lower than the right brachial pressure. Consistent with proximal left stenosis, possibly left subclavian.   RIGHT    LEFT    PRESSURE WAVEFORM  PRESSURE WAVEFORM  BRACHIAL 188 Tri  BRACHIAL 130 Mono  DP   DP    AT 72 Damp mono AT 70 Damp mono  PT 73 Damp mono PT 60 Damp mono  PER   PER    GREAT TOE  NA GREAT TOE  NA    RIGHT LEFT  ABI 0.39 0.37     Farrel Demark, RDMS, RVT  10/23/2011, 3:28 PM

## 2011-10-23 NOTE — Telephone Encounter (Signed)
Seth [name] from [facility] called to report bilateral ABI results were "severe for arterial disease". She asked whether Dr Neva Seat wants to do a Duplex US to check level/location of disease. Since Dr Neva Seat is not in office, checked w/Heather who OK'd order for this test w/results to Dr Neva Seat. Sent order in to [facility] and called her back to verify she received.

## 2011-11-02 ENCOUNTER — Encounter: Payer: Self-pay | Admitting: Family Medicine

## 2011-11-02 ENCOUNTER — Ambulatory Visit (INDEPENDENT_AMBULATORY_CARE_PROVIDER_SITE_OTHER): Payer: Medicare Other | Admitting: Family Medicine

## 2011-11-02 VITALS — BP 142/75 | HR 84 | Temp 98.0°F | Resp 16 | Ht 72.0 in | Wt 184.0 lb

## 2011-11-02 DIAGNOSIS — E871 Hypo-osmolality and hyponatremia: Secondary | ICD-10-CM

## 2011-11-02 DIAGNOSIS — I739 Peripheral vascular disease, unspecified: Secondary | ICD-10-CM

## 2011-11-02 DIAGNOSIS — I10 Essential (primary) hypertension: Secondary | ICD-10-CM | POA: Diagnosis not present

## 2011-11-02 DIAGNOSIS — M79609 Pain in unspecified limb: Secondary | ICD-10-CM | POA: Diagnosis not present

## 2011-11-02 DIAGNOSIS — I714 Abdominal aortic aneurysm, without rupture: Secondary | ICD-10-CM

## 2011-11-02 NOTE — Progress Notes (Signed)
Subjective:    Patient ID: Seth [name], male    DOB: 1945/05/05, 66 y.o.   MRN: [id number]  HPI Seth [name] is a 66 y.o. male  HTN - suspected machine was giving falsely elevated readings.  Has new machine, but did not bring to office visit.  Blood pressure lower in L arm when had vascular study. (130 vs 188).  Feels better off chlorthalidone. No low blood pressures. Home BP 150 -180 in R arm at home.  Checked in r arm today in office.  Now on metoprolol 50mg  1 and 1/2 BID. No new side effects. Feels less lethargic.   Hyponatremia - as above.   Calf pain, numbness - claudication from PVD vs neuronal impingement/spinal stenosis at LS spine, with underlying listhesis on LS xray. Lower extremity arterial duplex and ABI have been completed: Right= Monophasic common femoral artery, suggestive of Aortoiliac disease. Occluded femoral artery with recanalized flow seen at the level of distal popliteal artery and dampened monophasic flow distally. Left= Monophasic common femoral artery, suggestive of Aortoiliac disease. Occluded femoral artery with recanalized flow seen at the level of distal femoral artery and dampened monophasic flow distally. Left brachial pressure is dramatically lower than the right brachial pressure. Consistent with proximal left stenosis, possibly left subclavian. Continues to cut back on tobacco.  12- 13 cigarettes per day.    Hx of AAA - can schedule repeat ultrasound at this point. 3.0 by 3.2cm in 9/12. No new abd pain.   Review of Systems  Constitutional: Negative for fatigue and unexpected weight change.  Eyes: Negative for visual disturbance.  Respiratory: Positive for cough (slight with sinus drainage.  ). Negative for chest tightness and shortness of breath.   Cardiovascular: Negative for chest pain, palpitations and leg swelling.  Gastrointestinal: Negative for abdominal pain and blood in stool.  Neurological: Negative for dizziness, light-headedness and headaches.      Objective:   Physical Exam  Constitutional: He is oriented to person, place, and time. He appears well-developed and well-nourished.  HENT:  Head: Normocephalic and atraumatic.  Eyes: EOM are normal. Pupils are equal, round, and reactive to light.  Neck: No JVD present. Carotid bruit is not present.  Cardiovascular: Normal rate, regular rhythm and normal heart sounds.   No murmur heard. Pulmonary/Chest: Effort normal and breath sounds normal. He has no rales.  Abdominal: Soft. Normal appearance. He exhibits no pulsatile midline mass.  Musculoskeletal: He exhibits no edema.  Neurological: He is alert and oriented to person, place, and time.  Skin: Skin is warm and dry.  Psychiatric: He has a normal mood and affect.       Assessment & Plan:  Seth [name] is a 65 y.o. male 1. HTN (hypertension)  Basic metabolic panel  2. Hyponatremia  Basic metabolic panel  3. Peripheral vascular disease  Ambulatory referral to Vascular Surgery, [place] Art/Ven Flow Abd Pelv Doppler  4. Claudication  Ambulatory referral to Vascular Surgery  5. Abdominal aortic aneurysm  [place] Art/Ven Flow Abd Pelv Doppler    PVD - as above.  Needs to see vascular surgery in [place] due to insurance reasons. Will refer to vascular surgeon.   Hyponatremia - Rechecking BMP If Na still low, consider ACE-I as cause. Feels better with coming off chlorthalidone  HTN - borderline - encouraged to bring meter to next ov. Cont  Current meds.  Abdominal aortic aneurysm - scheduling repeat ultrasound. If greater than 10mm increase in size per year, or greater than 5.5 cm would  then consider repair.  Continuing to work on tobacco cessation.  Exercise precautions discussed prior.     Patient Instructions  We will call you to schedule the abdominal ultrasound and referral to vascular surgeon.  Recheck with your blood pressure meter in the next 4-6 weeks.  Your should receive a call or letter about your lab results within the next  week to 10 days.

## 2011-11-02 NOTE — Patient Instructions (Signed)
We will call you to schedule the abdominal ultrasound and referral to vascular surgeon.  Recheck with your blood pressure meter in the next 4-6 weeks.  Your should receive a call or letter about your lab results within the next week to 10 days.

## 2011-11-03 LAB — BASIC METABOLIC PANEL
CO2: 22 mEq/L (ref 19–32)
Chloride: 102 mEq/L (ref 96–112)
Creat: 1.01 mg/dL (ref 0.50–1.35)
Potassium: 4.2 mEq/L (ref 3.5–5.3)

## 2011-11-05 ENCOUNTER — Telehealth: Payer: Self-pay | Admitting: Radiology

## 2011-11-05 DIAGNOSIS — I714 Abdominal aortic aneurysm, without rupture: Secondary | ICD-10-CM

## 2011-11-05 NOTE — Telephone Encounter (Signed)
Corrected order to aorta US per referral dept/

## 2011-11-08 ENCOUNTER — Ambulatory Visit
Admission: RE | Admit: 2011-11-08 | Discharge: 2011-11-08 | Disposition: A | Discharge status: Home / Self Care | Payer: Medicare Other | Source: Ambulatory Visit | Attending: Family Medicine | Admitting: Family Medicine

## 2011-11-08 DIAGNOSIS — I714 Abdominal aortic aneurysm, without rupture: Secondary | ICD-10-CM

## 2011-11-26 DIAGNOSIS — I70209 Unspecified atherosclerosis of native arteries of extremities, unspecified extremity: Secondary | ICD-10-CM | POA: Diagnosis not present

## 2011-11-26 DIAGNOSIS — I714 Abdominal aortic aneurysm, without rupture: Secondary | ICD-10-CM | POA: Diagnosis not present

## 2011-11-26 DIAGNOSIS — I10 Essential (primary) hypertension: Secondary | ICD-10-CM | POA: Diagnosis not present

## 2011-11-26 DIAGNOSIS — R0989 Other specified symptoms and signs involving the circulatory and respiratory systems: Secondary | ICD-10-CM | POA: Diagnosis not present

## 2011-11-26 DIAGNOSIS — F17200 Nicotine dependence, unspecified, uncomplicated: Secondary | ICD-10-CM | POA: Diagnosis not present

## 2011-12-10 ENCOUNTER — Ambulatory Visit (INDEPENDENT_AMBULATORY_CARE_PROVIDER_SITE_OTHER): Payer: Medicare Other | Admitting: Family Medicine

## 2011-12-10 ENCOUNTER — Encounter: Payer: Self-pay | Admitting: Family Medicine

## 2011-12-10 VITALS — BP 130/80 | HR 70 | Temp 98.0°F | Resp 16 | Ht 72.0 in | Wt 182.5 lb

## 2011-12-10 DIAGNOSIS — Z720 Tobacco use: Secondary | ICD-10-CM

## 2011-12-10 DIAGNOSIS — F17200 Nicotine dependence, unspecified, uncomplicated: Secondary | ICD-10-CM | POA: Diagnosis not present

## 2011-12-10 DIAGNOSIS — I10 Essential (primary) hypertension: Secondary | ICD-10-CM | POA: Diagnosis not present

## 2011-12-10 DIAGNOSIS — I714 Abdominal aortic aneurysm, without rupture: Secondary | ICD-10-CM

## 2011-12-10 DIAGNOSIS — Z8249 Family history of ischemic heart disease and other diseases of the circulatory system: Secondary | ICD-10-CM

## 2011-12-10 DIAGNOSIS — I739 Peripheral vascular disease, unspecified: Secondary | ICD-10-CM | POA: Diagnosis not present

## 2011-12-10 NOTE — Patient Instructions (Signed)
We will refer you to a heart doctor to discuss stress testing or other possible evaluation given your peripheral vascular disease and brother with bypass grafting. If you have any chest pain go to an emergency room or call 911.  Return to the clinic or go to the nearest emergency room if any of your symptoms worsen or new symptoms occur.  Look up info on Chantix and smoking cessation here:To look up more info on your condition, go to the website [email or web address], then on patient resources - select UPTODATE. Under patient resources, select tobacco cessation.  If you would like to start this medicine - I can call this in for you.   Your should receive a call or letter about your lab results within the next week to 10 days. Follow up with me in the next 6 weeks.   Follow up with your vascular surgeon as planned. Continue your medicines as previously prescribed.

## 2011-12-10 NOTE — Progress Notes (Signed)
Subjective:    Patient ID: Seth [name], male    DOB: 1945-04-02, 66 y.o.   MRN: [id number]  HPI Seth [name] is a 66 y.o. male See last ov - 9/27.  Here for follow up.  PVD - referred to vascular surgeon in [facility]. Dr. Sondra Come.  OV few weeks ago. Planning on repeating some tests in December.  Thinking will need surgery.  May be stents or other surgery.   Hyponatremia - resolved. Feels better.  Results for orders placed in visit on 11/02/11  BASIC METABOLIC PANEL      Component Value Range   Sodium 136  135 - 145 mEq/L   Potassium 4.2  3.5 - 5.3 mEq/L   Chloride 102  96 - 112 mEq/L   CO2 22  19 - 32 mEq/L   Glucose, Bld 94  70 - 99 mg/dL   BUN 8  6 - 23 mg/dL   Creat 4.40  1.02 - 7.25 mg/dL   Calcium 9.4  8.4 - 36.6 mg/dL   HTN - borderline prior. No recent outside BP's.  No new side effects with meds.   Abdominal aortic aneurysm --per reviewed recommendations -  If greater than 10mm increase in size per year, or greater than 5.5 cm would then consider repair. Also plan to continue to work on tobacco cessation.  Exercise precautions discussed prior.  9/13 ultrasound: Comparison: CT of the abdomen and pelvis dated 06/14/2011 at  [facility] and prior abdominal ultrasound dated 10/24/2010.  Abdominal Aorta: Fusiform infrarenal abdominal aortic aneurysm  again identified. Morphology is similar to the prior CT with mural  thrombus and atherosclerotic plaque again identified.  Maximum AP diameter: 3.8 - 3.9 cm.  Maximum TRV diameter: 3.5 - 3.6 cm.  IMPRESSION:  Infrarenal abdominal aortic aneurysm with similar dimensions to the  prior CT in May.  Discussed continue monitoring q year.  Smoking 13-16 cigarettes per day. Wants to stop smoking.  Thinking about electronic cigarettes   Younger brother with CABG recently.     Review of Systems  Constitutional: Negative for fatigue and unexpected weight change.  Eyes: Negative for visual disturbance.  Respiratory: Negative for  cough, chest tightness and shortness of breath.   Cardiovascular: Negative for chest pain, palpitations and leg swelling.  Gastrointestinal: Negative for abdominal pain and blood in stool.  Neurological: Negative for dizziness, light-headedness and headaches.       Objective:   Physical Exam  Vitals reviewed. Constitutional: He is oriented to person, place, and time. He appears well-developed and well-nourished.  HENT:  Head: Normocephalic and atraumatic.  Eyes: EOM are normal. Pupils are equal, round, and reactive to light.  Neck: No JVD present. Carotid bruit is not present.  Cardiovascular: Normal rate, regular rhythm and normal heart sounds.   No murmur heard. Pulmonary/Chest: Effort normal and breath sounds normal. He has no rales.  Abdominal: He exhibits pulsatile midline mass (palpable abd aorta, but no lateral deviation. ). There is no tenderness.  Musculoskeletal: He exhibits no edema.  Neurological: He is alert and oriented to person, place, and time.  Skin: Skin is warm and dry.  Psychiatric: He has a normal mood and affect. His behavior is normal.      Assessment & Plan:  Seth [name] is a 66 y.o. male 1. HTN (hypertension)  Lipid panel, Comprehensive metabolic panel, Ambulatory referral to Cardiology  2. AAA (abdominal aortic aneurysm)  Lipid panel, Ambulatory referral to Cardiology  3. PVD (peripheral vascular disease)  Lipid panel,  Comprehensive metabolic panel, Ambulatory referral to Cardiology  4. Tobacco abuse  Comprehensive metabolic panel, Ambulatory referral to Cardiology  5. FH: CAD (coronary artery disease)  Lipid panel, Comprehensive metabolic panel, Ambulatory referral to Cardiology    Tobacco abuse - discussed combined risks including PVD, HTN, and risks with aneurysm.   Discussed Chantix.  Uptodate link given. Not ready to start this yet, but can call me if he would like to start this and we can call in rx.  Mood and psychiatric precautions discussed.    Abdominal aortic aneurysm- discussed overall stability, but again importance of tobacco cessation. Continue to monitor in next 1 year.  This was apparently discussed with his vascular surgeon at last office visit.   HTN - stable.  No change in meds for now  PVD - follow up with surgeon planned. New hx of younger brother with CABG. Will refer to cardiology for eval for stress testing. Lipids pending for further risk stratification.   Recheck in 6 weeks, rtc precautions.

## 2011-12-11 LAB — LIPID PANEL: HDL: 36 mg/dL — ABNORMAL LOW (ref >39)

## 2011-12-11 LAB — COMPREHENSIVE METABOLIC PANEL
CO2: 27 mEq/L (ref 19–32)
Calcium: 9.5 mg/dL (ref 8.4–10.5)
Glucose, Bld: 93 mg/dL (ref 70–99)
Potassium: 4.6 mEq/L (ref 3.5–5.3)
Sodium: 135 mEq/L (ref 135–145)
Total Bilirubin: 0.4 mg/dL (ref 0.3–1.2)
Total Protein: 7.5 g/dL (ref 6.0–8.3)

## 2011-12-11 NOTE — Progress Notes (Signed)
Six week f-up appt made with Dr. Neva Seat for 12/16. Seth [name]

## 2011-12-14 ENCOUNTER — Other Ambulatory Visit: Payer: Self-pay | Admitting: Family Medicine

## 2011-12-14 DIAGNOSIS — E785 Hyperlipidemia, unspecified: Secondary | ICD-10-CM

## 2011-12-14 MED ORDER — ROSUVASTATIN CALCIUM 10 MG PO TABS: 10.0000 mg | ORAL_TABLET | Freq: Every day | ORAL | Status: DC

## 2012-01-10 DIAGNOSIS — M79609 Pain in unspecified limb: Secondary | ICD-10-CM | POA: Diagnosis not present

## 2012-01-10 DIAGNOSIS — I714 Abdominal aortic aneurysm, without rupture: Secondary | ICD-10-CM | POA: Diagnosis not present

## 2012-01-10 DIAGNOSIS — R0989 Other specified symptoms and signs involving the circulatory and respiratory systems: Secondary | ICD-10-CM | POA: Diagnosis not present

## 2012-01-10 DIAGNOSIS — I70299 Other atherosclerosis of native arteries of extremities, unspecified extremity: Secondary | ICD-10-CM | POA: Diagnosis not present

## 2012-01-10 DIAGNOSIS — I70209 Unspecified atherosclerosis of native arteries of extremities, unspecified extremity: Secondary | ICD-10-CM | POA: Diagnosis not present

## 2012-01-10 DIAGNOSIS — I6529 Occlusion and stenosis of unspecified carotid artery: Secondary | ICD-10-CM | POA: Diagnosis not present

## 2012-01-21 ENCOUNTER — Encounter: Payer: Self-pay | Admitting: Family Medicine

## 2012-01-21 ENCOUNTER — Ambulatory Visit (INDEPENDENT_AMBULATORY_CARE_PROVIDER_SITE_OTHER): Payer: Medicare Other | Admitting: Family Medicine

## 2012-01-21 VITALS — BP 128/88 | HR 68 | Temp 98.2°F | Resp 16 | Ht 72.0 in | Wt 183.0 lb

## 2012-01-21 DIAGNOSIS — Z720 Tobacco use: Secondary | ICD-10-CM

## 2012-01-21 DIAGNOSIS — Z8249 Family history of ischemic heart disease and other diseases of the circulatory system: Secondary | ICD-10-CM

## 2012-01-21 DIAGNOSIS — F17200 Nicotine dependence, unspecified, uncomplicated: Secondary | ICD-10-CM

## 2012-01-21 DIAGNOSIS — I10 Essential (primary) hypertension: Secondary | ICD-10-CM

## 2012-01-21 DIAGNOSIS — E785 Hyperlipidemia, unspecified: Secondary | ICD-10-CM

## 2012-01-21 DIAGNOSIS — I739 Peripheral vascular disease, unspecified: Secondary | ICD-10-CM

## 2012-01-21 MED ORDER — METOPROLOL TARTRATE 50 MG PO TABS: ORAL_TABLET | ORAL | Status: DC

## 2012-01-21 MED ORDER — LISINOPRIL 10 MG PO TABS: 10.0000 mg | ORAL_TABLET | Freq: Every day | ORAL | Status: DC

## 2012-01-21 MED ORDER — ROSUVASTATIN CALCIUM 10 MG PO TABS: 10.0000 mg | ORAL_TABLET | Freq: Every day | ORAL | Status: DC

## 2012-01-21 NOTE — Progress Notes (Signed)
Subjective:    Patient ID: Seth [name], male    DOB: 06-29-1945, 66 y.o.   MRN: [id number]  HPI Seth [name] is a 66 y.o. male Hx of AAA, PVD, tobacco abuse, HTN.  Here for follow up.   Tobacco abuse - see last ov.  discussed combined risks including PVD, HTN, and risks with aneurysm.   Discussed Chantix.  Uptodate link given. Not ready for cessation at that OV.   Abdominal aortic aneurysm- overall stable, plan on routine follow up, and tob abuse discussed prior.  HTN - stable.  No change in meds for now  PVD -  New hx of younger brother with CABG at last office visit. Planned on referral to cardiology for eval for stress testing - pt to call and let us know which provider his brother saw.  Lipids last office visit.   S/p visit with vascular surgeon. No significant carotid stenosis. 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 Planning on stents January 13th for the pelvic arteries in office.  May still need to have bypass in the legs, but pelvic arteries intial concern.   Results for orders placed in visit on 12/10/11  LIPID PANEL      Component Value Range   Cholesterol 218 (*) 0 - 200 mg/dL   Triglycerides 119  <147 mg/dL   HDL 36 (*) >82 mg/dL   Total CHOL/HDL Ratio 6.1     VLDL 21  0 - 40 mg/dL   LDL Cholesterol 956 (*) 0 - 99 mg/dL  COMPREHENSIVE METABOLIC PANEL      Component Value Range   Sodium 135  135 - 145 mEq/L   Potassium 4.6  3.5 - 5.3 mEq/L   Chloride 101  96 - 112 mEq/L   CO2 27  19 - 32 mEq/L   Glucose, Bld 93  70 - 99 mg/dL   BUN 8  6 - 23 mg/dL   Creat 2.13  0.86 - 5.78 mg/dL   Total Bilirubin 0.4  0.3 - 1.2 mg/dL   Alkaline Phosphatase 108  39 - 117 U/L   AST 13  0 - 37 U/L   ALT 9  0 - 53 U/L   Total Protein 7.5  6.0 - 8.3 g/dL   Albumin 4.5  3.5 - 5.2 g/dL   Calcium 9.5  8.4 - 46.9 mg/dL   On Crestor for past month- taking at night, no new side effects. Tolerating this without difficulty.    Review of Systems  Constitutional: Negative for  fatigue and unexpected weight change.  Eyes: Negative for visual disturbance.  Respiratory: Negative for cough, chest tightness and shortness of breath.   Cardiovascular: Negative for chest pain, palpitations and leg swelling.  Gastrointestinal: Negative for abdominal pain and blood in stool.  Neurological: Negative for dizziness, light-headedness and headaches.       Objective:   Physical Exam  Vitals reviewed. Constitutional: He is oriented to person, place, and time. He appears well-developed and well-nourished.  HENT:  Head: Normocephalic and atraumatic.  Eyes: EOM are normal. Pupils are equal, round, and reactive to light.  Neck: No JVD present. Carotid bruit is not present.  Cardiovascular: Normal rate, regular rhythm and normal heart sounds.   No murmur heard. Pulmonary/Chest: Effort normal and breath sounds normal. He has no rales.  Abdominal: He exhibits no mass. There is no tenderness.  Musculoskeletal: He exhibits no edema.  Neurological: He is alert and oriented to person, place, and time.  Skin: Skin  is warm and dry.  Psychiatric: He has a normal mood and affect. His behavior is normal.        Assessment & Plan:  [name] is a 66 y.o. male 1. HTN (hypertension)  lisinopril (PRINIVIL,ZESTRIL) 10 MG tablet, metoprolol (LOPRESSOR) 50 MG tablet  2. Hyperlipidemia  Comprehensive metabolic panel, Lipid panel, rosuvastatin (CRESTOR) 10 MG tablet  3. PVD (peripheral vascular disease)  Comprehensive metabolic panel, Lipid panel  4. Family history of cardiac disorder    5. Tobacco abuse     PVD and FH of CAD/CABG in brother- had not called in yet with name of cardiologist where brother seen, but will do this in next few days.  Referral status is pending.   HTN- recheck BP on left is ok.  Likely component of PVD. No changes in meds for now.    Tobacco abuse - has cut back to 10-12 cigarettes each day.  Encouraged on continued efforts to decrease.   Hyperlipidemia -  tolerating statin.  Lab only orders in next 2-3 weeks. meds refilled.   Recheck in 3 months.  Patient Instructions  Call our office and advise [place] about the name of your cardiologist you would like to see.  Continue same meds for now, but come into the office for lab visit only after January 1st. Your should receive a call or letter about your lab results within the next week to 10 days after drawn.  Continue to cut back on cigarettes and let me know if you would like a chantix prescription to help with this.

## 2012-01-21 NOTE — Patient Instructions (Signed)
Call our office and advise [place] about the name of your cardiologist you would like to see.  Continue same meds for now, but come into the office for lab visit only after January 1st. Your should receive a call or letter about your lab results within the next week to 10 days after drawn.  Continue to cut back on cigarettes and let me know if you would like a chantix prescription to help with this.

## 2012-02-13 DIAGNOSIS — I10 Essential (primary) hypertension: Secondary | ICD-10-CM | POA: Diagnosis not present

## 2012-02-13 DIAGNOSIS — I70209 Unspecified atherosclerosis of native arteries of extremities, unspecified extremity: Secondary | ICD-10-CM | POA: Diagnosis not present

## 2012-02-18 DIAGNOSIS — I70219 Atherosclerosis of native arteries of extremities with intermittent claudication, unspecified extremity: Secondary | ICD-10-CM | POA: Diagnosis not present

## 2012-02-20 DIAGNOSIS — Z0389 Encounter for observation for other suspected diseases and conditions ruled out: Secondary | ICD-10-CM | POA: Diagnosis not present

## 2012-02-20 DIAGNOSIS — I739 Peripheral vascular disease, unspecified: Secondary | ICD-10-CM | POA: Diagnosis not present

## 2012-02-20 DIAGNOSIS — I714 Abdominal aortic aneurysm, without rupture: Secondary | ICD-10-CM | POA: Diagnosis not present

## 2012-02-20 DIAGNOSIS — F17200 Nicotine dependence, unspecified, uncomplicated: Secondary | ICD-10-CM | POA: Diagnosis not present

## 2012-02-20 DIAGNOSIS — Z8249 Family history of ischemic heart disease and other diseases of the circulatory system: Secondary | ICD-10-CM | POA: Diagnosis not present

## 2012-02-20 DIAGNOSIS — E782 Mixed hyperlipidemia: Secondary | ICD-10-CM | POA: Diagnosis not present

## 2012-03-06 DIAGNOSIS — R0602 Shortness of breath: Secondary | ICD-10-CM | POA: Diagnosis not present

## 2012-03-06 DIAGNOSIS — F17200 Nicotine dependence, unspecified, uncomplicated: Secondary | ICD-10-CM | POA: Diagnosis not present

## 2012-03-06 DIAGNOSIS — I714 Abdominal aortic aneurysm, without rupture: Secondary | ICD-10-CM | POA: Diagnosis not present

## 2012-03-06 DIAGNOSIS — Z8249 Family history of ischemic heart disease and other diseases of the circulatory system: Secondary | ICD-10-CM | POA: Diagnosis not present

## 2012-03-06 DIAGNOSIS — E782 Mixed hyperlipidemia: Secondary | ICD-10-CM | POA: Diagnosis not present

## 2012-03-06 DIAGNOSIS — Z0389 Encounter for observation for other suspected diseases and conditions ruled out: Secondary | ICD-10-CM | POA: Diagnosis not present

## 2012-03-06 DIAGNOSIS — M79609 Pain in unspecified limb: Secondary | ICD-10-CM | POA: Diagnosis not present

## 2012-03-06 DIAGNOSIS — I739 Peripheral vascular disease, unspecified: Secondary | ICD-10-CM | POA: Diagnosis not present

## 2012-03-08 ENCOUNTER — Ambulatory Visit (INDEPENDENT_AMBULATORY_CARE_PROVIDER_SITE_OTHER): Payer: Medicare Other | Admitting: Family Medicine

## 2012-03-08 VITALS — BP 158/98 | HR 70 | Temp 97.9°F | Resp 20 | Ht 72.5 in | Wt 179.6 lb

## 2012-03-08 DIAGNOSIS — I10 Essential (primary) hypertension: Secondary | ICD-10-CM | POA: Diagnosis not present

## 2012-03-08 DIAGNOSIS — L509 Urticaria, unspecified: Secondary | ICD-10-CM | POA: Diagnosis not present

## 2012-03-08 NOTE — Patient Instructions (Signed)
Take Zyrtec one daily Take Zantac 75 milligrams one twice daily  If rash gets worse please return, or go to the emergency room if abruptly worse  Find out the name of the agent that was given to you at the scan.  Hives Hives are itchy, red, swollen areas of the skin. They can vary in size and location on your body. Hives can come and go for hours or several days (acute hives) or for several weeks (chronic hives). Hives do not spread from person to person (noncontagious). They may get worse with scratching, exercise, and emotional stress. CAUSES   Allergic reaction to food, additives, or drugs.  Infections, including the common cold.  Illness, such as vasculitis, lupus, or thyroid disease.  Exposure to sunlight, heat, or cold.  Exercise.  Stress.  Contact with chemicals. SYMPTOMS   Red or white swollen patches on the skin. The patches may change size, shape, and location quickly and repeatedly.  Itching.  Swelling of the hands, feet, and face. This may occur if hives develop deeper in the skin. DIAGNOSIS  Your caregiver can usually tell what is wrong by performing a physical exam. Skin or blood tests may also be done to determine the cause of your hives. In some cases, the cause cannot be determined. TREATMENT  Mild cases usually get better with medicines such as antihistamines. Severe cases may require an emergency epinephrine injection. If the cause of your hives is known, treatment includes avoiding that trigger.  HOME CARE INSTRUCTIONS   Avoid causes that trigger your hives.  Take antihistamines as directed by your caregiver to reduce the severity of your hives. Non-sedating or low-sedating antihistamines are usually recommended. Do not drive while taking an antihistamine.  Take any other medicines prescribed for itching as directed by your caregiver.  Wear loose-fitting clothing.  Keep all follow-up appointments as directed by your caregiver. SEEK MEDICAL CARE IF:     You have persistent or severe itching that is not relieved with medicine.  You have painful or swollen joints. SEEK IMMEDIATE MEDICAL CARE IF:   You have a fever.  Your tongue or lips are swollen.  You have trouble breathing or swallowing.  You feel tightness in the throat or chest.  You have abdominal pain. These problems may be the first sign of a life-threatening allergic reaction. Call your local emergency services (911 in [place] MAKE SURE YOU:   Understand these instructions.  Will watch your condition.  Will get help right away if you are not doing well or get worse. Document Released: 01/22/2005 Document Revised: 07/24/2011 Document Reviewed: 04/17/2011 [facility] Patient Information 2013 [place], [place].

## 2012-03-08 NOTE — Progress Notes (Signed)
Subjective: Patient is here with history of having had a nuclear medicine arterial scan done in [place] on Thursday. He was given a injected contrast dye. On Friday he noticed a rash splotchy on his trunk. It had both itching and hurting. Did not take anything else or change any medications.  Objective: He says his blood pressure was fine at the doctor's office a few days ago. I did note that he was a little high today.  He has a splotchy erythematous rash on his trunk, primarily on his upper chest a couple placed on his back.  There are no other lesions. Chest is clear to auscultation. Neck supple without nodes.  Assessment: Urticaria Hypertension Probable contrast dye allergy  Plan: He needs to find out what was given him for the scan to document it.  Treat with antihistamines. He has a problem with taking steroids can breaks out in a rash. Therefore I did not attempt to steroids on him.

## 2012-03-12 DIAGNOSIS — E782 Mixed hyperlipidemia: Secondary | ICD-10-CM | POA: Diagnosis not present

## 2012-03-12 DIAGNOSIS — R9439 Abnormal result of other cardiovascular function study: Secondary | ICD-10-CM | POA: Diagnosis not present

## 2012-03-12 DIAGNOSIS — Z8249 Family history of ischemic heart disease and other diseases of the circulatory system: Secondary | ICD-10-CM | POA: Diagnosis not present

## 2012-03-12 DIAGNOSIS — I714 Abdominal aortic aneurysm, without rupture: Secondary | ICD-10-CM | POA: Diagnosis not present

## 2012-03-12 DIAGNOSIS — I739 Peripheral vascular disease, unspecified: Secondary | ICD-10-CM | POA: Diagnosis not present

## 2012-03-12 DIAGNOSIS — F17200 Nicotine dependence, unspecified, uncomplicated: Secondary | ICD-10-CM | POA: Diagnosis not present

## 2012-03-13 ENCOUNTER — Ambulatory Visit (INDEPENDENT_AMBULATORY_CARE_PROVIDER_SITE_OTHER): Payer: Medicare Other | Admitting: Emergency Medicine

## 2012-03-13 VITALS — BP 170/98 | HR 102 | Temp 100.3°F | Resp 20 | Ht 74.0 in | Wt 183.0 lb

## 2012-03-13 DIAGNOSIS — J111 Influenza due to unidentified influenza virus with other respiratory manifestations: Secondary | ICD-10-CM

## 2012-03-13 MED ORDER — HYDROCOD POLST-CHLORPHEN POLST 10-8 MG/5ML PO LQCR: 5.0000 mL | Freq: Two times a day (BID) | ORAL | Status: DC | PRN

## 2012-03-13 MED ORDER — OSELTAMIVIR PHOSPHATE 75 MG PO CAPS: 75.0000 mg | ORAL_CAPSULE | Freq: Two times a day (BID) | ORAL | Status: DC

## 2012-03-13 NOTE — Patient Instructions (Addendum)

## 2012-03-13 NOTE — Progress Notes (Signed)
[facility] [address], [place] [address] [phone] [phone]  Date:  03/13/2012   Name:  Seth [name]   DOB:  February 12, 1945   MRN:  [id number]  PCP:  Provider Not In System    Chief Complaint: Cough, sneezing, Fever and Chills   History of Present Illness:  Seth [name] is a 67 y.o. very pleasant male patient who presents with the following:  Ill with sudden onset yesterday of a cough that is rather severe and persistent and nonproductive. Has fatigue, malaise, myalgias and fever and chills.  Has mucoid nasal drainage and post nasal drip.  No wheezing or shortness of breath.  So ill that he cannot smoke.  No nausea or vomiting. No stool change or rash.  No improvement with OTC medication.  Wife ill at home.  Patient Active Problem List  Diagnosis  . HTN (hypertension)  . History of sciatica  . Essential hypertension, benign  . Seasonal allergies  . Abdominal aortic aneurysm    Past Medical History  Diagnosis Date  . History of sciatica   . Essential hypertension, benign   . Seasonal allergies   . Back pain     No past surgical history on file.  History  Substance Use Topics  . Smoking status: Current Every Day Smoker -- 0.5 packs/day for 50 years    Types: Cigarettes  . Smokeless tobacco: Not on file  . Alcohol Use: Yes     Comment: sometimes mixed drinks or beers    Family History  Problem Relation Age of Onset  . Hypertension Mother   . Gout Mother     Allergies  Allergen Reactions  . Biaxin (Clarithromycin) Shortness Of Breath  . Clarithromycin Shortness Of Breath  . Cortisone Other (See Comments)    Turns red from breast up  . Iodine Hives    Dye from nuclear medicine test  . Prednisone     Medication list has been reviewed and updated.  Current Outpatient Prescriptions on File Prior to Visit  Medication Sig Dispense Refill  . aspirin EC 81 MG tablet Take 81 mg by mouth daily.      . clopidogrel (PLAVIX) 75 MG tablet Take 75 mg by  mouth daily.      Marland Kitchen lisinopril (PRINIVIL,ZESTRIL) 10 MG tablet Take 1 tablet (10 mg total) by mouth daily.  90 tablet  1  . metoprolol (LOPRESSOR) 50 MG tablet Take 1 and 1/2 tablets by mouth twice per day.  180 tablet  1  . rosuvastatin (CRESTOR) 10 MG tablet Take 1 tablet (10 mg total) by mouth daily.  30 tablet  1    Review of Systems:  As per HPI, otherwise negative.    Physical Examination: Filed Vitals:   03/13/12 1751  BP: 170/98  Pulse: 102  Temp: 100.3 F (37.9 C)  Resp: 20   Filed Vitals:   03/13/12 1751  Height: 6\' 2"  (1.88 m)  Weight: 183 lb (83.008 kg)   Body mass index is 23.50 kg/(m^2). Ideal Body Weight: Weight in (lb) to have BMI = 25: 194.3   GEN: WDWN, NAD, Non-toxic, A & O x 3 HEENT: Atraumatic, Normocephalic. Neck supple. No masses, No LAD. Ears and Nose: No external deformity. CV: RRR, No M/G/R. No JVD. No thrill. No extra heart sounds. PULM: CTA B, no wheezes, crackles, rhonchi. No retractions. No resp. distress. No accessory muscle use. ABD: S, NT, ND, +BS. No rebound. No HSM. EXTR: No c/c/e NEURO Normal gait.  PSYCH: Normally interactive. Conversant. Not depressed or anxious appearing.  Calm demeanor.    Assessment and Plan: Influenza tussionex tamiflu   Carmelina Dane, MD

## 2012-03-17 DIAGNOSIS — I739 Peripheral vascular disease, unspecified: Secondary | ICD-10-CM | POA: Diagnosis not present

## 2012-03-17 DIAGNOSIS — I714 Abdominal aortic aneurysm, without rupture: Secondary | ICD-10-CM | POA: Diagnosis not present

## 2012-03-17 DIAGNOSIS — I70209 Unspecified atherosclerosis of native arteries of extremities, unspecified extremity: Secondary | ICD-10-CM | POA: Diagnosis not present

## 2012-03-24 ENCOUNTER — Ambulatory Visit (INDEPENDENT_AMBULATORY_CARE_PROVIDER_SITE_OTHER): Payer: Medicare Other | Admitting: Family Medicine

## 2012-03-24 ENCOUNTER — Encounter: Payer: Self-pay | Admitting: Family Medicine

## 2012-03-24 VITALS — BP 132/80 | HR 98 | Temp 97.9°F | Resp 16 | Ht 72.0 in | Wt 166.8 lb

## 2012-03-24 DIAGNOSIS — I10 Essential (primary) hypertension: Secondary | ICD-10-CM

## 2012-03-24 DIAGNOSIS — R9439 Abnormal result of other cardiovascular function study: Secondary | ICD-10-CM

## 2012-03-24 DIAGNOSIS — Z720 Tobacco use: Secondary | ICD-10-CM

## 2012-03-24 DIAGNOSIS — R05 Cough: Secondary | ICD-10-CM

## 2012-03-24 DIAGNOSIS — I739 Peripheral vascular disease, unspecified: Secondary | ICD-10-CM

## 2012-03-24 DIAGNOSIS — F17200 Nicotine dependence, unspecified, uncomplicated: Secondary | ICD-10-CM

## 2012-03-24 DIAGNOSIS — E785 Hyperlipidemia, unspecified: Secondary | ICD-10-CM

## 2012-03-24 DIAGNOSIS — J09X2 Influenza due to identified novel influenza A virus with other respiratory manifestations: Secondary | ICD-10-CM

## 2012-03-24 MED ORDER — HYDROCOD POLST-CHLORPHEN POLST 10-8 MG/5ML PO LQCR: 5.0000 mL | Freq: Two times a day (BID) | ORAL | Status: DC | PRN

## 2012-03-24 MED ORDER — ROSUVASTATIN CALCIUM 10 MG PO TABS: 10.0000 mg | ORAL_TABLET | Freq: Every day | ORAL | Status: DC

## 2012-03-24 MED ORDER — BENZONATATE 100 MG PO CAPS: 100.0000 mg | ORAL_CAPSULE | Freq: Three times a day (TID) | ORAL | Status: DC | PRN

## 2012-03-24 MED ORDER — METOPROLOL TARTRATE 50 MG PO TABS: ORAL_TABLET | ORAL | Status: DC

## 2012-03-24 MED ORDER — LISINOPRIL 10 MG PO TABS: 10.0000 mg | ORAL_TABLET | Freq: Every day | ORAL | Status: DC

## 2012-03-24 NOTE — Progress Notes (Signed)
Subjective:    Patient ID: Seth [name], male    DOB: 09-10-1945, 67 y.o.   MRN: [id number]  HPI Seth [name] is a 67 y.o. male multiple concerns today.   Recently seen 03/13/12 with influenza. Had not taken flu vaccine. Treated with tamiflu - finished. Felt bad or about 8 days.  Feeling a little better past few days. Less appetite. No recent fever. Occasional cough attacks still few times per day. Taking tussionex at night. Helps with sleep. No meds for cough during the day.   HTN - did see Dr Dione Housekeeper with [facility], had a stress test last month - abnormal stress test - discussed in office with cardiologist, and planning on heart cath in next month. FH of CABG in younger brother. Taking meds without difficulty, taking plavix and asa.   Hyperlipidemia.  Results for orders placed in visit on 12/10/11  LIPID PANEL      Result Value Range   Cholesterol 218 (*) 0 - 200 mg/dL   Triglycerides 409  <811 mg/dL   HDL 36 (*) >91 mg/dL   Total CHOL/HDL Ratio 6.1     VLDL 21  0 - 40 mg/dL   LDL Cholesterol 478 (*) 0 - 99 mg/dL  COMPREHENSIVE METABOLIC PANEL      Result Value Range   Sodium 135  135 - 145 mEq/L   Potassium 4.6  3.5 - 5.3 mEq/L   Chloride 101  96 - 112 mEq/L   CO2 27  19 - 32 mEq/L   Glucose, Bld 93  70 - 99 mg/dL   BUN 8  6 - 23 mg/dL   Creat 2.95  6.21 - 3.08 mg/dL   Total Bilirubin 0.4  0.3 - 1.2 mg/dL   Alkaline Phosphatase 108  39 - 117 U/L   AST 13  0 - 37 U/L   ALT 9  0 - 53 U/L   Total Protein 7.5  6.0 - 8.3 g/dL   Albumin 4.5  3.5 - 5.2 g/dL   Calcium 9.5  8.4 - 65.7 mg/dL   Started on Crestor 10mg  in November.  Unable to get fasting labs due to mixup, but plans on coming in this week.  Not fasting in office.   PVD - see prior visit. S/p visit with vascular surgeon. but pelvic arteries 90% blocked. Stents January 13th for the pelvic arteries in office. Had follow up last week with PA - had doppler - told had better blood flow. Plans on follow up in 6  months.  tobacco abuse - cut back with recent illness - 5- 6 cigs per day. Plans on quitting on birthday.  04/25/12. Plans to try without meds.    Review of Systems  Constitutional: Negative for fever, chills, fatigue and unexpected weight change.  Eyes: Negative for visual disturbance.  Respiratory: Positive for cough. Negative for chest tightness and shortness of breath.   Cardiovascular: Negative for chest pain, palpitations and leg swelling.  Gastrointestinal: Negative for abdominal pain and blood in stool.  Neurological: Negative for dizziness, light-headedness and headaches.        Objective:   Physical Exam  Vitals reviewed. Constitutional: He is oriented to person, place, and time. He appears well-developed and well-nourished.  HENT:  Head: Normocephalic and atraumatic.  Eyes: EOM are normal. Pupils are equal, round, and reactive to light.  Neck: No JVD present. Carotid bruit is not present.  Cardiovascular: Normal rate, regular rhythm and normal heart sounds.   No murmur heard. Pulmonary/Chest:  Effort normal and breath sounds normal. No respiratory distress. He has no wheezes. He has no rales.  Musculoskeletal: He exhibits no edema.  Neurological: He is alert and oriented to person, place, and time.  Skin: Skin is warm and dry.  Psychiatric: He has a normal mood and affect. His behavior is normal.      Assessment & Plan:  Seth [name] is a 67 y.o. male HTN (hypertension) - stable. Plan: lisinopril (PRINIVIL,ZESTRIL) 10 MG tablet, metoprolol (LOPRESSOR) 50 MG tablet refilled. Follow up for cath with cardiology as planned. Rtc/er cp precautions.   Hyperlipidemia - Plan: rosuvastatin (CRESTOR) 10 MG tablet  Influenza due to identified novel influenza A virus with other respiratory manifestations  Cough -  Post influenza. Plan: benzonatate (TESSALON) 100 MG capsule rx or otc mucinex prn,  chlorpheniramine-HYDROcodone (TUSSIONEX PENNKINETIC ER) 10-8 MG/5ML LQCR - at night  only if needed, and taper off as able. rtc if any increased cough or fever noted.   PVD (peripheral vascular disease). Stable, improved after stenting.  Cont plavix/asa as instructed by vascular surgeon.  Abnormal stress test - as above - planning on cardiac cath.   Tobacco abuse - improved with less smoking recently and plan of quit date.  Discussed chantix, but declined at present.   Meds ordered this encounter  Medications  . lisinopril (PRINIVIL,ZESTRIL) 10 MG tablet    Sig: Take 1 tablet (10 mg total) by mouth daily.    Dispense:  90 tablet    Refill:  1  . metoprolol (LOPRESSOR) 50 MG tablet    Sig: Take 1 and 1/2 tablets by mouth twice per day.    Dispense:  180 tablet    Refill:  1  . rosuvastatin (CRESTOR) 10 MG tablet    Sig: Take 1 tablet (10 mg total) by mouth daily.    Dispense:  30 tablet    Refill:  1  . benzonatate (TESSALON) 100 MG capsule    Sig: Take 1 capsule (100 mg total) by mouth 3 (three) times daily as needed for cough.    Dispense:  20 capsule    Refill:  0  . chlorpheniramine-HYDROcodone (TUSSIONEX PENNKINETIC ER) 10-8 MG/5ML LQCR    Sig: Take 5 mLs by mouth every 12 (twelve) hours as needed (cough).    Dispense:  60 mL    Refill:  0   Patient Instructions  Tessalon during day if needed, OR Mucinex or Mucinex DM if needed for cough - these are over the counter.  If needed can take the cough syrup at night. Return to the clinic or go to the nearest emergency room if any of your symptoms worsen or new symptoms occur. Come in for fasting labs next few days. Your should receive a call or letter about your lab results within the next week to 10 days.  Recheck in 2 months.

## 2012-03-24 NOTE — Patient Instructions (Addendum)
Tessalon during day if needed, OR Mucinex or Mucinex DM if needed for cough - these are over the counter.  If needed can take the cough syrup at night. Return to the clinic or go to the nearest emergency room if any of your symptoms worsen or new symptoms occur. Come in for fasting labs next few days. Your should receive a call or letter about your lab results within the next week to 10 days.  Recheck in 2 months.

## 2012-04-07 ENCOUNTER — Other Ambulatory Visit: Payer: Self-pay | Admitting: Cardiology

## 2012-04-08 NOTE — H&P (Signed)
03/12/2012 Office Visit: Seth Schultz, MD      Reason for Appointment  1. MS/stress test f/u  History of Present Illness  General:  67 year old here at the request of Dr. Neva Seat to evaluate hypertension, abdominal aortic aneurysm, PVD. He is seen in vascular surgeon in [facility], Dr. Sondra Come. Creatinine 1.01 maximum diameter of abdominal aortic aneurysm 3.9 cm, infrarenal. Younger brother had bypass surgery. Tobacco use noted. He is here today to discuss possible stress testing or other possible evaluation given the peripheral vascular disease that he has an brother with bypass grafting. Dr. Sondra Come recently placed two stents in ?iliacs?. ?patient states that they were placed in pelvic artery.  In December brother had bypass surgery. Rare palpitations. Denies CP or SOB with exertion. Years ago hurt, chest pain down to left hand, severe, thought he was going to have to go in to ER.  He underwent nuclear stress test which demonstrated transient ischemic dilatation of 1.54. This may be representative of triple-vessel disease as his brother had. Today he is here for discussion of cardiac catheterization..   Current Medications  Lisinopril 10 MG Tablet 1 tablet Once a day  Crestor 10 MG Tablet 1 tablet Once a day  Metoprolol Tartrate 50 MG Tablet 1 AND 1/2 TABS Twice a day  Plavix 75 MG Tablet 1 tablet Once a day  Aspirin 81 MG Tablet Chewable 1 tablet Once a day  Medication List reviewed and reconciled with the patient    Family History  Brother 1: Coronary artery disease/CABG   Social History  General:  History of smoking  cigarettes: Current smoker Frequency: 1/2 PPD no Alcohol.  no Recreational drug use.  Occupation: Educational psychologist.   Allergies  BIACTIN: HARD TO BREATHE  Cortisone: RASH  lexiscan: rash on upper torso  Review of Systems  No bleeding, no syncope, no strokelike symptoms.  Vital Signs  Wt 184, Wt change 3 lb, Ht 71, BMI 25.66, Pulse sitting 72, BP sitting 114/90  L arm, Repeat BP 162/90 R arm.  Examination  General Examination: GENERAL APPEARANCE alert, oriented, NAD, pleasant.  SKIN: normal, no rash.  HEENT: normal.  HEAD: [place]/AT.  EYES: EOMI, Conjunctiva clear.  NECK: supple, FROM, without evidence of thyromegaly, adenopathy, or bruits, no jugular venous distention (JVD).  LUNGS: clear to auscultation bilaterally, no wheezes, rhonchi, rales, regular breathing rate and effort.  HEART: regular rate and rhythm, no S3, S4, murmur or rub, point of maximul impulse (PMI) normal, I do not appreciate any bruits over the subclavian region.  ABDOMEN: soft, non-tender/non-distended, bowel sounds present, no masses palpated, no bruit.  EXTREMITIES: no clubbing, no edema, diminished pulses bilaterally, DP.  NEUROLOGIC EXAM: non-focal exam, alert and oriented x 3.  PERIPHERAL PULSES: normal (2+) bilaterally.  LYMPH NODES: no cervical adenopathy.  PSYCH affect normal.  Exam above was completed during previous encounter.   Assessments  1. Other nonspecific abnormal cardiovascular system function study - 794.39 (Primary)  2. AAA - 441.4  3. Tobacco dependence - 305.1  4. Hypercholesterolemia, Mixed - 272.2  5. Family history of ischemic heart disease - V17.3  6. Peripheral Vascular Disease - 443.9  Treatment  1. Other nonspecific abnormal cardiovascular system function study  With his transient ischemic dilatation, strong family history, peripheral vascular disease, I recommend that we proceed with heart catheterization via the radial artery approach. After discussion with him he states that his insurance company, [employer], does not allow him to have procedures at [facility]. I  strongly recommend cardiac catheterization for there is a chance that he has triple-vessel disease. If he is unable to undergo procedure at our health system, I will be happy to set records elsewhere so that he is able to be taken care of.     Follow Up  post cath

## 2012-04-10 ENCOUNTER — Inpatient Hospital Stay (HOSPITAL_BASED_OUTPATIENT_CLINIC_OR_DEPARTMENT_OTHER)
Admission: RE | Admit: 2012-04-10 | Discharge: 2012-04-10 | Disposition: A | Discharge status: Home / Self Care | Payer: Medicare Other | Source: Ambulatory Visit | Attending: Cardiology | Admitting: Cardiology

## 2012-04-10 ENCOUNTER — Encounter (HOSPITAL_BASED_OUTPATIENT_CLINIC_OR_DEPARTMENT_OTHER)
Admission: RE | Disposition: A | Discharge status: Home / Self Care | Payer: Self-pay | Source: Ambulatory Visit | Attending: Cardiology

## 2012-04-10 DIAGNOSIS — I10 Essential (primary) hypertension: Secondary | ICD-10-CM

## 2012-04-10 DIAGNOSIS — F17200 Nicotine dependence, unspecified, uncomplicated: Secondary | ICD-10-CM | POA: Diagnosis not present

## 2012-04-10 DIAGNOSIS — I714 Abdominal aortic aneurysm, without rupture, unspecified: Secondary | ICD-10-CM

## 2012-04-10 DIAGNOSIS — I2510 Atherosclerotic heart disease of native coronary artery without angina pectoris: Secondary | ICD-10-CM | POA: Insufficient documentation

## 2012-04-10 DIAGNOSIS — I739 Peripheral vascular disease, unspecified: Secondary | ICD-10-CM | POA: Insufficient documentation

## 2012-04-10 DIAGNOSIS — R9439 Abnormal result of other cardiovascular function study: Secondary | ICD-10-CM

## 2012-04-10 DIAGNOSIS — Z8249 Family history of ischemic heart disease and other diseases of the circulatory system: Secondary | ICD-10-CM | POA: Insufficient documentation

## 2012-04-10 SURGERY — JV LEFT HEART CATHETERIZATION WITH CORONARY ANGIOGRAM

## 2012-04-10 MED ORDER — ONDANSETRON HCL 4 MG/2ML IJ SOLN: 4.0000 mg | Freq: Four times a day (QID) | INTRAMUSCULAR | Status: DC | PRN

## 2012-04-10 MED ORDER — ASPIRIN 81 MG PO CHEW
324.0000 mg | CHEWABLE_TABLET | ORAL | Status: AC
  Administered 2012-04-10: 324 mg via ORAL

## 2012-04-10 MED ORDER — ACETAMINOPHEN 325 MG PO TABS: 650.0000 mg | ORAL_TABLET | ORAL | Status: DC | PRN

## 2012-04-10 MED ORDER — DIAZEPAM 5 MG PO TABS
5.0000 mg | ORAL_TABLET | ORAL | Status: AC
  Administered 2012-04-10: 5 mg via ORAL

## 2012-04-10 MED ORDER — SODIUM CHLORIDE 0.9 % IV SOLN: 250.0000 mL | INTRAVENOUS | Status: DC | PRN

## 2012-04-10 MED ORDER — SODIUM CHLORIDE 0.9 % IJ SOLN: 3.0000 mL | Freq: Two times a day (BID) | INTRAMUSCULAR | Status: DC

## 2012-04-10 MED ORDER — SODIUM CHLORIDE 0.9 % IJ SOLN: 3.0000 mL | INTRAMUSCULAR | Status: DC | PRN

## 2012-04-10 MED ORDER — SODIUM CHLORIDE 0.9 % IV SOLN: INTRAVENOUS | Status: DC

## 2012-04-10 MED ORDER — SODIUM CHLORIDE 0.9 % IV SOLN: 1.0000 mL/kg/h | INTRAVENOUS | Status: AC

## 2012-04-10 NOTE — Interval H&P Note (Signed)
History and Physical Interval Note:  04/10/2012 9:55 AM  Seth [name]  has presented today for surgery, with the diagnosis of cp  The various methods of treatment have been discussed with the patient and family. After consideration of risks, benefits and other options for treatment, the patient has consented to  Procedure(s): JV LEFT HEART CATHETERIZATION WITH CORONARY ANGIOGRAM (N/A) as a surgical intervention .  The patient's history has been reviewed, patient examined, no change in status, stable for surgery.  I have reviewed the patient's chart and labs.  Questions were answered to the patient's satisfaction.     SKAINS, MARK

## 2012-04-10 NOTE — OR Nursing (Signed)
Dr Skains at bedside to discuss results and treatment plan with pt and family 

## 2012-04-10 NOTE — CV Procedure (Signed)
CARDIAC CATHETERIZATION  PROCEDURE:  Left heart catheterization with selective coronary angiography, left ventriculogram via the radial artery approach.  INDICATIONS:  67 year  old male with abnormal nuclear stress test showing transient ischemic dilatation strong family history of coronary artery disease, prior abdominal aortic aneurysm, tobacco use.  The risks, benefits, and details of the procedure were explained to the patient, including possibilities of stroke, heart attack, death, renal impairment, arterial damage, bleeding.  The patient verbalized understanding and wanted to proceed.  Informed written consent was obtained.  PROCEDURE TECHNIQUE:  Allen's test was performed pre-and post procedure and was normal. The right radial artery site was prepped and draped in a sterile fashion. One percent lidocaine was used for local anesthesia. Using the modified Seldinger technique a 5 French hydrophilic sheath was inserted into the radial artery without difficulty. 3 mg of verapamil was administered via the sheath. A Judkins right #4 catheter with the guidance of a Versicore wire was placed in the right coronary cusp and selectively cannulated the right coronary artery. After traversing the aortic arch, 4000 units of heparin IV was administered. A Judkins left #3.5 catheter was used to selectively cannulate the left main artery. Multiple views with hand injection of Omnipaque were obtained. Catheter a pigtail catheter was used to cross into the left ventricle, hemodynamics were obtained, and a left ventriculogram was performed in the RAO position with power injection. Following the procedure, sheath was removed, patient was hemodynamically stable, hemostasis was maintained with a Terumo T band.   CONTRAST:  Total of 75 ml.    FLOUROSCOPY TIME: 2.1 min.  COMPLICATIONS:  None.    HEMODYNAMICS:  Aortic pressure was 127/62 mmHg; LV systolic pressure was 127 mmHg; LVEDP 15 mmHg.  There was no gradient  between the left ventricle and aorta.    ANGIOGRAPHIC DATA:    Left main: Patent, gives rise to LAD and circumflex artery no calcification.  Left anterior descending (LAD): Mild diffuse disease with one significant diagonal branch. Mid LAD portion just after first diagonal branch exhibits 40% stenosis, eccentric. Best seen on straight cranial view.  Circumflex artery (CIRC): 2 significant obtuse marginal branches. Mild luminal irregularities. Small distal caliber vessels.  Right coronary artery (RCA): Moderate diffuse disease, dominant vessel giving rise to the posterior descending artery. Proximal portion of RCA has 30-40% diffuse irregularities. The distal portion of this vessel is relatively small in caliber. There is no flow-limiting coronary disease present.  LEFT VENTRICULOGRAM:  Left ventricular angiogram was done in the 30 RAO projection and revealed normal left ventricular wall motion and systolic function with an estimated ejection fraction of 65 %.   IMPRESSIONS:  Minor to moderate diffuse coronary irregularities but no flow-limiting CAD present. Normal left ventricular systolic function.  LVEDP 15 mmHg.  Ejection fraction 65 %.  RECOMMENDATION:  Reassuring, continue with medical management.

## 2012-04-10 NOTE — OR Nursing (Signed)
Discharge instructions reviewed and signed, pt stated understanding, ambulated in hall without difficulty, site level 0, transported to wife's car via wheelchair 

## 2012-04-10 NOTE — OR Nursing (Signed)
Meal served 

## 2012-04-10 NOTE — H&P (View-Only) (Signed)
03/12/2012 Office Visit: Seth Skains, MD      Reason for Appointment  1. MS/stress test f/u  History of Present Illness  General:  67-year-old here at the request of Dr. Greene to evaluate hypertension, abdominal aortic aneurysm, PVD. He is seen in vascular surgeon in [place], Dr. Cruz. Creatinine 1.01 maximum diameter of abdominal aortic aneurysm 3.9 cm, infrarenal. Younger brother had bypass surgery. Tobacco use noted. He is here today to discuss possible stress testing or other possible evaluation given the peripheral vascular disease that he has an brother with bypass grafting. Dr. Cruz recently placed two stents in ?iliacs?. ?patient states that they were placed in pelvic artery.  In December brother had bypass surgery. Rare palpitations. Denies CP or SOB with exertion. Years ago hurt, chest pain down to left hand, severe, thought he was going to have to go in to ER.  He underwent nuclear stress test which demonstrated transient ischemic dilatation of 1.54. This may be representative of triple-vessel disease as his brother had. Today he is here for discussion of cardiac catheterization..   Current Medications  Lisinopril 10 MG Tablet 1 tablet Once a day  Crestor 10 MG Tablet 1 tablet Once a day  Metoprolol Tartrate 50 MG Tablet 1 AND 1/2 TABS Twice a day  Plavix 75 MG Tablet 1 tablet Once a day  Aspirin 81 MG Tablet Chewable 1 tablet Once a day  Medication List reviewed and reconciled with the patient    Family History  Brother 1: Coronary artery disease/CABG   Social History  General:  History of smoking  cigarettes: Current smoker Frequency: 1/2 PPD no Alcohol.  no Recreational drug use.  Occupation: Mental heath coordinator.   Allergies  BIACTIN: HARD TO BREATHE  Cortisone: RASH  lexiscan: rash on upper torso  Review of Systems  No bleeding, no syncope, no strokelike symptoms.  Vital Signs  Wt 184, Wt change 3 lb, Ht 71, BMI 25.66, Pulse sitting 72, BP sitting 114/90  L arm, Repeat BP 162/90 R arm.  Examination  General Examination: GENERAL APPEARANCE alert, oriented, NAD, pleasant.  SKIN: normal, no rash.  HEENT: normal.  HEAD: [place]/AT.  EYES: EOMI, Conjunctiva clear.  NECK: supple, FROM, without evidence of thyromegaly, adenopathy, or bruits, no jugular venous distention (JVD).  LUNGS: clear to auscultation bilaterally, no wheezes, rhonchi, rales, regular breathing rate and effort.  HEART: regular rate and rhythm, no S3, S4, murmur or rub, point of maximul impulse (PMI) normal, I do not appreciate any bruits over the subclavian region.  ABDOMEN: soft, non-tender/non-distended, bowel sounds present, no masses palpated, no bruit.  EXTREMITIES: no clubbing, no edema, diminished pulses bilaterally, DP.  NEUROLOGIC EXAM: non-focal exam, alert and oriented x 3.  PERIPHERAL PULSES: normal (2+) bilaterally.  LYMPH NODES: no cervical adenopathy.  PSYCH affect normal.  Exam above was completed during previous encounter.   Assessments  1. Other nonspecific abnormal cardiovascular system function study - 794.39 (Primary)  2. AAA - 441.4  3. Tobacco dependence - 305.1  4. Hypercholesterolemia, Mixed - 272.2  5. Family history of ischemic heart disease - V17.3  6. Peripheral Vascular Disease - 443.9  Treatment  1. Other nonspecific abnormal cardiovascular system function study  With his transient ischemic dilatation, strong family history, peripheral vascular disease, I recommend that we proceed with heart catheterization via the radial artery approach. After discussion with him he states that his insurance company, [employer], does not allow him to have procedures at [facility]. I   strongly recommend cardiac catheterization for there is a chance that he has triple-vessel disease. If he is unable to undergo procedure at our health system, I will be happy to set records elsewhere so that he is able to be taken care of.     Follow Up  post cath     

## 2012-04-17 DIAGNOSIS — I714 Abdominal aortic aneurysm, without rupture: Secondary | ICD-10-CM | POA: Diagnosis not present

## 2012-04-17 DIAGNOSIS — I2510 Atherosclerotic heart disease of native coronary artery without angina pectoris: Secondary | ICD-10-CM | POA: Diagnosis not present

## 2012-05-26 ENCOUNTER — Encounter: Payer: Self-pay | Admitting: Family Medicine

## 2012-05-26 ENCOUNTER — Ambulatory Visit (INDEPENDENT_AMBULATORY_CARE_PROVIDER_SITE_OTHER): Payer: Medicare Other | Admitting: Family Medicine

## 2012-05-26 ENCOUNTER — Encounter: Payer: Self-pay | Admitting: *Deleted

## 2012-05-26 VITALS — BP 118/76 | HR 85 | Temp 98.1°F | Resp 16 | Ht 72.0 in | Wt 174.0 lb

## 2012-05-26 DIAGNOSIS — I10 Essential (primary) hypertension: Secondary | ICD-10-CM | POA: Diagnosis not present

## 2012-05-26 DIAGNOSIS — F17200 Nicotine dependence, unspecified, uncomplicated: Secondary | ICD-10-CM

## 2012-05-26 DIAGNOSIS — Z8679 Personal history of other diseases of the circulatory system: Secondary | ICD-10-CM

## 2012-05-26 DIAGNOSIS — I739 Peripheral vascular disease, unspecified: Secondary | ICD-10-CM

## 2012-05-26 DIAGNOSIS — I714 Abdominal aortic aneurysm, without rupture: Secondary | ICD-10-CM

## 2012-05-26 DIAGNOSIS — E785 Hyperlipidemia, unspecified: Secondary | ICD-10-CM

## 2012-05-26 DIAGNOSIS — L500 Allergic urticaria: Secondary | ICD-10-CM

## 2012-05-26 DIAGNOSIS — Z720 Tobacco use: Secondary | ICD-10-CM

## 2012-05-26 MED ORDER — ROSUVASTATIN CALCIUM 10 MG PO TABS: 10.0000 mg | ORAL_TABLET | Freq: Every day | ORAL | Status: DC

## 2012-05-26 NOTE — Progress Notes (Signed)
Subjective:    Patient ID: Seth [name], male    DOB: [birth year], 67 y.o.   MRN: [id number]  HPI Seth [name] is a 67 y.o. male Last ov 03/24/12.   HTN - no new side effects with meds. Outside bp's: 176/92, 142/84.  Was anxious at time of heart cath - high readings then as well. No recent chest pains.   Hives last week - noticed after eating pizza.  Noticed hives and red rash on front of chest 30 minutes later. Itching on face and scalp, but no rash and no other rash. No trouble breathing or swallowing difficulty.  Improved some today after using zyrtec and zantac past few days. No dyspnea or trouble swallowing. Urticaria in February possibly with contrast dye with nuclear med scan. No reaction with contrast used for heart cath last month.  No known food allergy.   Hyperlipidemia -on Crestor 10 MG tablet - taking every night. No new myalgias. Forgot to get bloodwork done after last ov.  Not fasting today.   Results for orders placed in visit on 12/10/11  LIPID PANEL      Result Value Range   Cholesterol 218 (*) 0 - 200 mg/dL   Triglycerides 409  <811 mg/dL   HDL 36 (*) >91 mg/dL   Total CHOL/HDL Ratio 6.1     VLDL 21  0 - 40 mg/dL   LDL Cholesterol 478 (*) 0 - 99 mg/dL  COMPREHENSIVE METABOLIC PANEL      Result Value Range   Sodium 135  135 - 145 mEq/L   Potassium 4.6  3.5 - 5.3 mEq/L   Chloride 101  96 - 112 mEq/L   CO2 27  19 - 32 mEq/L   Glucose, Bld 93  70 - 99 mg/dL   BUN 8  6 - 23 mg/dL   Creat 2.95  6.21 - 3.08 mg/dL   Total Bilirubin 0.4  0.3 - 1.2 mg/dL   Alkaline Phosphatase 108  39 - 117 U/L   AST 13  0 - 37 U/L   ALT 9  0 - 53 U/L   Total Protein 7.5  6.0 - 8.3 g/dL   Albumin 4.5  3.5 - 5.2 g/dL   Calcium 9.5  8.4 - 65.7 mg/dL   PVD (peripheral vascular disease). Stable, improved after pelvic artery stenting.  Still on plavix.  Has been off aspirin, but plans to restart. Follow up in August. Hx of AAA.    Abnormal stress test , s/p cath - no significant obstructive  CAD, medical mgt recommended: 04/10/12:IMPRESSIONS:  1. Minor to moderate diffuse coronary irregularities but no flow-limiting CAD present. 2. Normal left ventricular systolic function. LVEDP 15 mmHg. Ejection fraction 65 %. RECOMMENDATION: Reassuring, continue with medical management.   Tobacco abuse - down to 4 cigarettes per day.   Pnd, slight cough with allergies., taking zyrtec. Used nasal spray in past - irritated nasal passsage - declined rx today.    Review of Systems  Constitutional: Negative for fatigue and unexpected weight change.  HENT: Positive for postnasal drip. Negative for trouble swallowing.   Eyes: Negative for visual disturbance.  Respiratory: Positive for cough (min with pnd). Negative for chest tightness, shortness of breath, wheezing and stridor.   Cardiovascular: Negative for chest pain, palpitations and leg swelling.  Gastrointestinal: Negative for abdominal pain and blood in stool.  Skin: Positive for rash.  Neurological: Negative for dizziness, light-headedness and headaches.       Objective:   Physical Exam  Vitals reviewed. Constitutional: He is oriented to person, place, and time. He appears well-developed and well-nourished.  HENT:  Head: Normocephalic and atraumatic.  Eyes: EOM are normal. Pupils are equal, round, and reactive to light.  Neck: No JVD present. Carotid bruit is not present.  Cardiovascular: Normal rate, regular rhythm and normal heart sounds.   No murmur heard. Pulmonary/Chest: Effort normal and breath sounds normal. No respiratory distress. He has no wheezes. He has no rales.  Musculoskeletal: He exhibits no edema.  Neurological: He is alert and oriented to person, place, and time.  Skin: Skin is warm and dry. No rash noted.  Psychiatric: He has a normal mood and affect. His behavior is normal.       Assessment & Plan:   Seth [name] is a 67 y.o. male Hyperlipidemia - Plan: rosuvastatin (CRESTOR) 10 MG tablet refilled. Plan on  labs as below.   HTN (hypertension)  controlled today in office.  Suspect anxiety component on outside BP's. No change in regimen today.   Allergic urticaria - Plan: Ambulatory referral to Allergy. Improving, continue zyrtec and zantac for next few days then zyrtec qd. Rtc/er precautions if sx's recur.   Tobacco abuse - commended on continued attempts at cessation. Continue to cut back.   PVD (peripheral vascular disease) - continue plavix, asa as recommended, and follow up as scheduled.  Can discuss follow up of AAA then as well.   Hx of abdominal aortic aneurysm -slight increase last September, but under 10mm per year and under size cutoff for operative intervention. Follow up with vascular surgeon.   Meds ordered this encounter  Medications  .       .       . rosuvastatin (CRESTOR) 10 MG tablet    Sig: Take 1 tablet (10 mg total) by mouth daily.    Dispense:  30 tablet    Refill:  1    . Patient Instructions  Continue zyrtec each day. We will refer you to an allergist. If hives return - recheck or go to an emergency room. No change in meds, fasting bloodwork tomorrow morning. Your should receive a call or letter about your lab results within the next week to 10 days. Continue to decrease your cigarette use. Keep your follow up with vascular doctor including for monitoring the abdominal aneurysm.      Addendum - inadvertantly let patient leave prior to obtaining lab work. Will place lab only order for next few weeks for CMP, lipid panel.  Phone call placed - left message on VM of this information.

## 2012-05-26 NOTE — Patient Instructions (Addendum)
Continue zyrtec each day. We will refer you to an allergist. If hives return - recheck or go to an emergency room. No change in meds, fasting bloodwork tomorrow morning. Your should receive a call or letter about your lab results within the next week to 10 days. Continue to decrease your cigarette use. Keep your follow up with vascular doctor including for monitoring the abdominal aneurysm.

## 2012-05-27 ENCOUNTER — Telehealth: Payer: Self-pay | Admitting: Family Medicine

## 2012-05-27 ENCOUNTER — Other Ambulatory Visit (INDEPENDENT_AMBULATORY_CARE_PROVIDER_SITE_OTHER): Payer: Medicare Other

## 2012-05-27 DIAGNOSIS — I714 Abdominal aortic aneurysm, without rupture: Secondary | ICD-10-CM

## 2012-05-27 DIAGNOSIS — E785 Hyperlipidemia, unspecified: Secondary | ICD-10-CM

## 2012-05-27 DIAGNOSIS — I739 Peripheral vascular disease, unspecified: Secondary | ICD-10-CM | POA: Diagnosis not present

## 2012-05-27 DIAGNOSIS — I10 Essential (primary) hypertension: Secondary | ICD-10-CM

## 2012-05-27 LAB — LIPID PANEL
Cholesterol: 152 mg/dL (ref 0–200)
Total CHOL/HDL Ratio: 3.8 Ratio

## 2012-05-27 LAB — COMPREHENSIVE METABOLIC PANEL
ALT: <8 U/L (ref 0–53)
AST: 10 U/L (ref 0–37)
Albumin: 4.1 g/dL (ref 3.5–5.2)
CO2: 27 mEq/L (ref 19–32)
Calcium: 9.3 mg/dL (ref 8.4–10.5)
Chloride: 104 mEq/L (ref 96–112)
Creat: 1.16 mg/dL (ref 0.50–1.35)
Potassium: 4.6 mEq/L (ref 3.5–5.3)

## 2012-05-27 NOTE — Telephone Encounter (Signed)
Left message and apologized for not obtaining labs at OV yesterday. Inadvertantly did not check lipids or CMP.  Order in system for lab only visit for CMP, lipids - fasting. Left this info on his VM, but if we can try to reach him in person and verify he received the message that would be appreciated. Thanks. -JG

## 2012-05-27 NOTE — Telephone Encounter (Signed)
Called patient to advise. He did come in this morning for the labs, he was fasting. Advised him we will call him with the results. To you FYI

## 2012-06-09 DIAGNOSIS — J309 Allergic rhinitis, unspecified: Secondary | ICD-10-CM | POA: Diagnosis not present

## 2012-06-09 DIAGNOSIS — L509 Urticaria, unspecified: Secondary | ICD-10-CM | POA: Diagnosis not present

## 2012-06-26 ENCOUNTER — Encounter: Payer: Self-pay | Admitting: Family Medicine

## 2012-08-10 ENCOUNTER — Other Ambulatory Visit: Payer: Self-pay | Admitting: Family Medicine

## 2012-08-11 ENCOUNTER — Other Ambulatory Visit: Payer: Self-pay | Admitting: Family Medicine

## 2012-08-25 ENCOUNTER — Ambulatory Visit (INDEPENDENT_AMBULATORY_CARE_PROVIDER_SITE_OTHER): Payer: Medicare Other | Admitting: Family Medicine

## 2012-08-25 ENCOUNTER — Encounter: Payer: Self-pay | Admitting: Family Medicine

## 2012-08-25 VITALS — BP 158/80 | HR 68 | Temp 98.5°F | Resp 16 | Ht 72.0 in | Wt 186.0 lb

## 2012-08-25 DIAGNOSIS — I10 Essential (primary) hypertension: Secondary | ICD-10-CM

## 2012-08-25 DIAGNOSIS — J301 Allergic rhinitis due to pollen: Secondary | ICD-10-CM | POA: Diagnosis not present

## 2012-08-25 DIAGNOSIS — E785 Hyperlipidemia, unspecified: Secondary | ICD-10-CM

## 2012-08-25 DIAGNOSIS — I70209 Unspecified atherosclerosis of native arteries of extremities, unspecified extremity: Secondary | ICD-10-CM

## 2012-08-25 DIAGNOSIS — E782 Mixed hyperlipidemia: Secondary | ICD-10-CM | POA: Diagnosis not present

## 2012-08-25 DIAGNOSIS — F17200 Nicotine dependence, unspecified, uncomplicated: Secondary | ICD-10-CM | POA: Diagnosis not present

## 2012-08-25 DIAGNOSIS — J309 Allergic rhinitis, unspecified: Secondary | ICD-10-CM

## 2012-08-25 DIAGNOSIS — Z720 Tobacco use: Secondary | ICD-10-CM

## 2012-08-25 MED ORDER — METOPROLOL TARTRATE 50 MG PO TABS: ORAL_TABLET | ORAL | Status: DC

## 2012-08-25 MED ORDER — ROSUVASTATIN CALCIUM 10 MG PO TABS: ORAL_TABLET | ORAL | Status: DC

## 2012-08-25 MED ORDER — LISINOPRIL 10 MG PO TABS: 10.0000 mg | ORAL_TABLET | Freq: Every day | ORAL | Status: DC

## 2012-08-25 NOTE — Progress Notes (Signed)
Subjective:    Patient ID: Seth [name], male    DOB: 1945/05/26, 67 y.o.   MRN: [id number]  HPI Seth [name] is a 67 y.o. male  Hyperlipidemia - takes Crestor 10 MG tablet QD. Recent labs below. Still taking once per day. No new side effects, no new myalgias.  Results for orders placed in visit on 05/27/12  COMPREHENSIVE METABOLIC PANEL      Result Value Range   Sodium 137  135 - 145 mEq/L   Potassium 4.6  3.5 - 5.3 mEq/L   Chloride 104  96 - 112 mEq/L   CO2 27  19 - 32 mEq/L   Glucose, Bld 110 (*) 70 - 99 mg/dL   BUN 15  6 - 23 mg/dL   Creat 0.86  5.78 - 4.69 mg/dL   Total Bilirubin 0.4  0.3 - 1.2 mg/dL   Alkaline Phosphatase 88  39 - 117 U/L   AST 10  0 - 37 U/L   ALT <8  0 - 53 U/L   Total Protein 6.9  6.0 - 8.3 g/dL   Albumin 4.1  3.5 - 5.2 g/dL   Calcium 9.3  8.4 - 62.9 mg/dL  LIPID PANEL      Result Value Range   Cholesterol 152  0 - 200 mg/dL   Triglycerides 93  <528 mg/dL   HDL 40  >41 mg/dL   Total CHOL/HDL Ratio 3.8     VLDL 19  0 - 40 mg/dL   LDL Cholesterol 93  0 - 99 mg/dL     HTN (hypertension)  controlled last ov.  Suspected some anxiety component on outside BP's. Hx abnormal stress test , s/p cath March 2014 - no significant obstructive CAD, medical mgt recommended by cardiology.  Minor to moderate diffuse coronary irregularities, but no flow-limiting CAD present, EF 65%.  Outside bp's a few times.  Usually lower at other offices, and in 120's/ 60's.   Ambulatory referral to Allergy last ov for allergic urticaria. No known cause on allergy testing, but treated allergies with Zyrtec and zantac. Could have been dye used in stress test? Still with constant drainage down nose. Recommended saline nasal spray.  Reddened face with steroid nasal sprays, had been recommended to see ENT for other options. Wants to try saline solution first.   Tobacco abuse - cutting back last ov to 4 cigarettes per day, now about 10 cigarettes per day due to stress - not back at levels  smoked prior. No other meds - does not want to take anything.   PVD (peripheral vascular disease) - followed by vascular surgeon. . S/p pelvic artery stenting.  Still on plavix, occasional ASA. appt next month with vascular surgeon.   Hx of abdominal aortic aneurysm -slight increase last September, but under 10mm per year and under size cutoff for operative intervention. Follow up with vascular surgeon scheduled in 1 month.  Plans to discuss repeat scan of AAA at that ov.   Review of Systems  Constitutional: Negative for fatigue and unexpected weight change.  Eyes: Negative for visual disturbance.  Respiratory: Negative for cough, chest tightness and shortness of breath.   Cardiovascular: Negative for chest pain, palpitations and leg swelling.  Gastrointestinal: Negative for abdominal pain and blood in stool.  Neurological: Positive for dizziness (rare- thinks from sinuses, does not appear to be from medicine. only 1 time every few months. ). Negative for light-headedness and headaches.       Objective:   Physical Exam  Vitals reviewed. Constitutional: He is oriented to person, place, and time. He appears well-developed and well-nourished.  HENT:  Head: Normocephalic and atraumatic.  Eyes: EOM are normal. Pupils are equal, round, and reactive to light.  Neck: No JVD present. Carotid bruit is not present.  Cardiovascular: Normal rate, regular rhythm and normal heart sounds.   No murmur heard. Pulmonary/Chest: Effort normal and breath sounds normal. He has no rales.  Musculoskeletal: He exhibits no edema.  Neurological: He is alert and oriented to person, place, and time.  Skin: Skin is warm and dry.  Psychiatric: He has a normal mood and affect.          Assessment & Plan:  [name] is a 67 y.o. male HTN (hypertension) - stable on outside readings. Continue same doses of: lisinopril (PRINIVIL,ZESTRIL) 10 MG tablet, metoprolol (LOPRESSOR) 50 MG tablet. Plan on labs at next ov  in 3 months.   Other and unspecified hyperlipidemia - controlled prior. Continue Crestor.   Atherosclerotic peripheral vascular disease - continue follow up with vascular surgery as scheduled. Due for repeat scan of AAA in September.  - they can order, or if needed, can call me to order. Asx.    Allergic rhinitis - trial of saline ns as intolerance/allergy with flushing of face with steroid ns in past. Cont zyrtec/zantac as rx by allergist, and discussed ENT eval - declined currently.   Tobacco abuse - has increased use with stress but still would like to quit, but without Rx meds.   Phone number for smoking cessation clinic given.   Meds ordered this encounter  Medications  . lisinopril (PRINIVIL,ZESTRIL) 10 MG tablet    Sig: Take 1 tablet (10 mg total) by mouth daily.    Dispense:  90 tablet    Refill:  1  . metoprolol (LOPRESSOR) 50 MG tablet    Sig: Take 1 and 1/2 tablets by mouth twice per day.    Dispense:  180 tablet    Refill:  1  . rosuvastatin (CRESTOR) 10 MG tablet    Sig: TAKE 1 TABLET (10 MG TOTAL) BY MOUTH DAILY.    Dispense:  90 tablet    Refill:  1   Patient Instructions  [place] offers smoking cessation clinics. Registration is required. To register call [phone] or register online at [email or web address]. Discuss repeat scan of abdominal aneurysm with your vascular surgeon.  Continue other medicines at same doses, add saline nasal spray, and let me know if you would like to be referred to ENT.  Return to the clinic or go to the nearest emergency room if any of your symptoms worsen or new symptoms occur.

## 2012-08-25 NOTE — Patient Instructions (Addendum)
[place] offers smoking cessation clinics. Registration is required. To register call [phone] or register online at [email or web address]. Discuss repeat scan of abdominal aneurysm with your vascular surgeon.  Continue other medicines at same doses, add saline nasal spray, and let me know if you would like to be referred to ENT.  Return to the clinic or go to the nearest emergency room if any of your symptoms worsen or new symptoms occur.

## 2012-09-15 DIAGNOSIS — I739 Peripheral vascular disease, unspecified: Secondary | ICD-10-CM | POA: Diagnosis not present

## 2012-09-15 DIAGNOSIS — I658 Occlusion and stenosis of other precerebral arteries: Secondary | ICD-10-CM | POA: Diagnosis not present

## 2012-09-15 DIAGNOSIS — I714 Abdominal aortic aneurysm, without rupture: Secondary | ICD-10-CM | POA: Diagnosis not present

## 2012-09-27 DIAGNOSIS — J309 Allergic rhinitis, unspecified: Secondary | ICD-10-CM | POA: Diagnosis not present

## 2012-09-27 DIAGNOSIS — H65199 Other acute nonsuppurative otitis media, unspecified ear: Secondary | ICD-10-CM | POA: Diagnosis not present

## 2012-10-16 DIAGNOSIS — Z8249 Family history of ischemic heart disease and other diseases of the circulatory system: Secondary | ICD-10-CM | POA: Diagnosis not present

## 2012-10-16 DIAGNOSIS — E782 Mixed hyperlipidemia: Secondary | ICD-10-CM | POA: Diagnosis not present

## 2012-10-16 DIAGNOSIS — I714 Abdominal aortic aneurysm, without rupture: Secondary | ICD-10-CM | POA: Diagnosis not present

## 2012-10-16 DIAGNOSIS — F17200 Nicotine dependence, unspecified, uncomplicated: Secondary | ICD-10-CM | POA: Diagnosis not present

## 2012-10-16 DIAGNOSIS — I739 Peripheral vascular disease, unspecified: Secondary | ICD-10-CM | POA: Diagnosis not present

## 2012-10-16 DIAGNOSIS — I259 Chronic ischemic heart disease, unspecified: Secondary | ICD-10-CM | POA: Diagnosis not present

## 2012-10-17 ENCOUNTER — Encounter: Payer: Self-pay | Admitting: Radiology

## 2012-10-17 DIAGNOSIS — F17200 Nicotine dependence, unspecified, uncomplicated: Secondary | ICD-10-CM

## 2012-10-17 DIAGNOSIS — I739 Peripheral vascular disease, unspecified: Secondary | ICD-10-CM

## 2012-10-17 DIAGNOSIS — I7025 Atherosclerosis of native arteries of other extremities with ulceration: Secondary | ICD-10-CM | POA: Insufficient documentation

## 2012-10-17 DIAGNOSIS — R0989 Other specified symptoms and signs involving the circulatory and respiratory systems: Secondary | ICD-10-CM

## 2012-10-20 DIAGNOSIS — I6529 Occlusion and stenosis of unspecified carotid artery: Secondary | ICD-10-CM | POA: Diagnosis not present

## 2012-10-20 DIAGNOSIS — I70209 Unspecified atherosclerosis of native arteries of extremities, unspecified extremity: Secondary | ICD-10-CM | POA: Diagnosis not present

## 2012-10-20 DIAGNOSIS — I714 Abdominal aortic aneurysm, without rupture: Secondary | ICD-10-CM | POA: Diagnosis not present

## 2012-10-22 ENCOUNTER — Encounter: Payer: Self-pay | Admitting: Radiology

## 2012-10-22 DIAGNOSIS — R0989 Other specified symptoms and signs involving the circulatory and respiratory systems: Secondary | ICD-10-CM

## 2012-10-22 DIAGNOSIS — I739 Peripheral vascular disease, unspecified: Secondary | ICD-10-CM

## 2012-10-22 DIAGNOSIS — I714 Abdominal aortic aneurysm, without rupture: Secondary | ICD-10-CM

## 2012-12-01 ENCOUNTER — Ambulatory Visit (INDEPENDENT_AMBULATORY_CARE_PROVIDER_SITE_OTHER): Payer: Medicare Other | Admitting: Family Medicine

## 2012-12-01 ENCOUNTER — Encounter: Payer: Self-pay | Admitting: Family Medicine

## 2012-12-01 VITALS — BP 154/85 | HR 66 | Temp 97.3°F | Resp 16 | Ht 73.0 in | Wt 190.0 lb

## 2012-12-01 DIAGNOSIS — I739 Peripheral vascular disease, unspecified: Secondary | ICD-10-CM | POA: Diagnosis not present

## 2012-12-01 DIAGNOSIS — J301 Allergic rhinitis due to pollen: Secondary | ICD-10-CM

## 2012-12-01 DIAGNOSIS — J309 Allergic rhinitis, unspecified: Secondary | ICD-10-CM

## 2012-12-01 DIAGNOSIS — I714 Abdominal aortic aneurysm, without rupture: Secondary | ICD-10-CM

## 2012-12-01 DIAGNOSIS — I10 Essential (primary) hypertension: Secondary | ICD-10-CM | POA: Diagnosis not present

## 2012-12-01 DIAGNOSIS — Z720 Tobacco use: Secondary | ICD-10-CM

## 2012-12-01 DIAGNOSIS — F17200 Nicotine dependence, unspecified, uncomplicated: Secondary | ICD-10-CM

## 2012-12-01 DIAGNOSIS — E785 Hyperlipidemia, unspecified: Secondary | ICD-10-CM

## 2012-12-01 MED ORDER — IPRATROPIUM BROMIDE 0.06 % NA SOLN: 2.0000 | Freq: Four times a day (QID) | NASAL | Status: DC

## 2012-12-01 MED ORDER — LISINOPRIL 5 MG PO TABS: 5.0000 mg | ORAL_TABLET | Freq: Every day | ORAL | Status: DC

## 2012-12-01 NOTE — Patient Instructions (Addendum)
Take additional 5mg  lisinopril each day. Keep a record of your blood pressures outside of the office and bring them to the next office visit - also call with a summary in a few weeks on this new dose.  Return to the clinic or go to the nearest emergency room if any of your symptoms worsen or new symptoms occur. Work on smoking cessation as discussed.  You can try the new nasal spray for allergies, and continue Zyrtec, but if this does not help your symptoms, may need to see ENT.

## 2012-12-01 NOTE — Progress Notes (Signed)
Subjective:    Patient ID: Seth [name], male    DOB: 06/12/1945, 67 y.o.   MRN: [id number]  HPI Seth [name] is a 67 y.o. male Last ov 08/25/12  Hyperlipidemia - takes Crestor 10 MG tablet QD. Most recent lipid panel in April as below. No new side effects, no new myalgias.   Results for orders placed in visit on 05/27/12  COMPREHENSIVE METABOLIC PANEL      Result Value Range   Sodium 137  135 - 145 mEq/L   Potassium 4.6  3.5 - 5.3 mEq/L   Chloride 104  96 - 112 mEq/L   CO2 27  19 - 32 mEq/L   Glucose, Bld 110 (*) 70 - 99 mg/dL   BUN 15  6 - 23 mg/dL   Creat 4.09  8.11 - 9.14 mg/dL   Total Bilirubin 0.4  0.3 - 1.2 mg/dL   Alkaline Phosphatase 88  39 - 117 U/L   AST 10  0 - 37 U/L   ALT <8  0 - 53 U/L   Total Protein 6.9  6.0 - 8.3 g/dL   Albumin 4.1  3.5 - 5.2 g/dL   Calcium 9.3  8.4 - 78.2 mg/dL  LIPID PANEL      Result Value Range   Cholesterol 152  0 - 200 mg/dL   Triglycerides 93  <956 mg/dL   HDL 40  >21 mg/dL   Total CHOL/HDL Ratio 3.8     VLDL 19  0 - 40 mg/dL   LDL Cholesterol 93  0 - 99 mg/dL   HTN (hypertension)  Controlled prior. Outside BP's prior 120/60's.  Recently: 140-150/ 78-85 at home and doctor's offices. Lopressor 75mg  BID, lisinopril 64m qd. Sedation noted after evening meds. No daytime somnolence.    Hx abnormal stress test , s/p cath March 2014 - no significant obstructive CAD, medical mgt recommended by cardiology.  Minor to moderate diffuse coronary irregularities, but no flow-limiting CAD present, EF 65%.  Cards follow up (Dr. Burgess Amor) since last ov - no new changes, plans on recheck in a year.   Tobacco abuse - prior given resources for cessation as did not want to try meds. Still smoking 10-14 per day. Did not call cessation resources, feels like stress brings on smoking.   PVD (peripheral vascular disease) - followed by vascular surgeon. . S/p pelvic artery stenting.  Still on plavix, occasional ASA.  Had carotid dopplers about a month ago at vasc.  Surgeon.  No news on lower extremity - told was slightly increased, but plans to watch this as well - about every 6 months?   Hx of abdominal aortic aneurysm -slight increase September 2013, but under 10mm per year and under size cutoff for operative intervention. Follow up with vascular surgeon about a month ago - told was not significantly increased from 3.9 to 4.1 - plans on repeat ultrasound scan in 6 months.   Runny nose - has seen allergist.    Tried non steroid nasal spray in past - unknown name as prescribed by [facility].    Patient Active Problem List   Diagnosis Date Noted  . AAA (abdominal aortic aneurysm) without rupture 10/22/2012  . Nicotine addiction 10/17/2012  . Atherosclerosis of native arteries of the extremities with ulceration(440.23) 10/17/2012  . Carotid bruit 10/17/2012  . Other nonspecific abnormal cardiovascular system function study 04/10/2012  . History of sciatica   . Essential hypertension, benign   . Seasonal allergies   . HTN (hypertension) 04/04/2011  .  Abdominal aortic aneurysm 11/03/2010   Past Medical History  Diagnosis Date  . History of sciatica   . Essential hypertension, benign   . Seasonal allergies   . Back pain   . Hyperlipidemia    No past surgical history on file. Allergies  Allergen Reactions  . Biaxin [Clarithromycin] Shortness Of Breath  . Clarithromycin Shortness Of Breath  . Iodinated Diagnostic Agents Hives  . Cortisone Other (See Comments)    Turns red from breast up  . Iodine Hives    Dye from nuclear medicine test  . Prednisone    Prior to Admission medications   Medication Sig Start Date End Date Taking? Authorizing Provider  aspirin EC 81 MG tablet Take 81 mg by mouth daily.   Yes Historical Provider, MD  cetirizine (ZYRTEC) 10 MG tablet Take 10 mg by mouth daily.   Yes Historical Provider, MD  clopidogrel (PLAVIX) 75 MG tablet Take 75 mg by mouth daily.   Yes Historical Provider, MD  lisinopril  (PRINIVIL,ZESTRIL) 10 MG tablet Take 1 tablet (10 mg total) by mouth daily. 08/25/12 08/25/13 Yes Shade Flood, MD  metoprolol (LOPRESSOR) 50 MG tablet Take 1 and 1/2 tablets by mouth twice per day. 08/25/12  Yes Shade Flood, MD  ranitidine (ZANTAC) 75 MG tablet Take 75 mg by mouth 2 (two) times daily.   Yes Historical Provider, MD  rosuvastatin (CRESTOR) 10 MG tablet TAKE 1 TABLET (10 MG TOTAL) BY MOUTH DAILY. 08/25/12  Yes Shade Flood, MD  benzonatate (TESSALON) 100 MG capsule Take 1 capsule (100 mg total) by mouth 3 (three) times daily as needed for cough. 03/24/12   Shade Flood, MD  chlorpheniramine-HYDROcodone [facility] PENNKINETIC ER) 10-8 MG/5ML LQCR Take 5 mLs by mouth every 12 (twelve) hours as needed (cough). 03/24/12   Shade Flood, MD  oseltamivir (TAMIFLU) 75 MG capsule Take 1 capsule (75 mg total) by mouth 2 (two) times daily. 03/13/12   Phillips Odor, MD   History   Social History  . Marital Status: Married    Spouse Name: N/A    Number of Children: N/A  . Years of Education: N/A   Occupational History  . Not on file.   Social History Main Topics  . Smoking status: Current Every Day Smoker -- 0.50 packs/day for 50 years    Types: Cigarettes  . Smokeless tobacco: Not on file  . Alcohol Use: Yes     Comment: sometimes mixed drinks or beers  . Drug Use: Not on file  . Sexual Activity: Not on file   Other Topics Concern  . Not on file   Social History Narrative  . No narrative on file       Review of Systems  Constitutional: Negative for fatigue and unexpected weight change.  Eyes: Negative for visual disturbance.  Respiratory: Negative for cough, chest tightness and shortness of breath.   Cardiovascular: Negative for chest pain, palpitations and leg swelling.  Gastrointestinal: Negative for abdominal pain and blood in stool.  Neurological: Negative for dizziness, light-headedness and headaches.       Objective:   Physical Exam  Vitals  reviewed. Constitutional: He is oriented to person, place, and time. He appears well-developed and well-nourished.  HENT:  Head: Normocephalic and atraumatic.  Right Ear: External ear normal.  Left Ear: External ear normal.  Nose: Mucosal edema present.  Mouth/Throat: Oropharynx is clear and moist. No oropharyngeal exudate.  Eyes: EOM are normal. Pupils are equal, round, and reactive to  light.  Neck: No JVD present. Carotid bruit is not present.  Cardiovascular: Normal rate, regular rhythm and normal heart sounds.   No murmur heard. Pulmonary/Chest: Effort normal and breath sounds normal. He has no rales.  Abdominal: Normal appearance. He exhibits no pulsatile midline mass. There is no tenderness.  Musculoskeletal: He exhibits no edema.  Lymphadenopathy:    He has no cervical adenopathy.  Neurological: He is alert and oriented to person, place, and time.  Skin: Skin is warm and dry.  Psychiatric: He has a normal mood and affect.        Assessment & Plan:   Seth [name] is a 67 y.o. male HTN (hypertension) - Plan: lisinopril (PRINIVIL,ZESTRIL) 5 MG tablet added to regimen as borderline high here, but also elevated out of office and goal of tight blood control with vascular disease and AAA. Recheck in few weeks.   Other and unspecified hyperlipidemia - Plan: Comprehensive metabolic panel, Lipid panel ordered. Continue Crestor.   AAA (abdominal aortic aneurysm) without rupture - stable by report with plan of continued periodic monitoring.   PVD (peripheral vascular disease) - stable by report - cont follow up with vascular surgeon, and control of HTN, hyperlipidemia.  Tobacco abuse - cessation again recommended and discussed, especially in light of above problems.  Allergic rhinitis - Plan: ipratropium (ATROVENT) 0.06 % nasal spray trial as unable to take steroid nasal sprays and not controlled with Zyrtec. If unable to provide relief, may need ENT eval.    Meds ordered this  encounter  Medications  . lisinopril (PRINIVIL,ZESTRIL) 5 MG tablet    Sig: Take 1 tablet (5 mg total) by mouth daily.    Dispense:  90 tablet    Refill:  3  . ipratropium (ATROVENT) 0.06 % nasal spray    Sig: Place 2 sprays into the nose 4 (four) times daily.    Dispense:  15 mL    Refill:  1   Patient Instructions  Take additional 5mg  lisinopril each day. Keep a record of your blood pressures outside of the office and bring them to the next office visit - also call with a summary in a few weeks on this new dose.  Return to the clinic or go to the nearest emergency room if any of your symptoms worsen or new symptoms occur. Work on smoking cessation as discussed.  You can try the new nasal spray for allergies, and continue Zyrtec, but if this does not help your symptoms, may need to see ENT.

## 2012-12-22 DIAGNOSIS — L509 Urticaria, unspecified: Secondary | ICD-10-CM | POA: Diagnosis not present

## 2012-12-22 DIAGNOSIS — J309 Allergic rhinitis, unspecified: Secondary | ICD-10-CM | POA: Diagnosis not present

## 2013-02-03 ENCOUNTER — Other Ambulatory Visit: Payer: Self-pay | Admitting: Family Medicine

## 2013-03-02 ENCOUNTER — Encounter: Payer: Self-pay | Admitting: Family Medicine

## 2013-03-02 ENCOUNTER — Ambulatory Visit (INDEPENDENT_AMBULATORY_CARE_PROVIDER_SITE_OTHER): Payer: Medicare Other | Admitting: Family Medicine

## 2013-03-02 VITALS — BP 162/80 | HR 71 | Temp 98.0°F | Resp 16 | Ht 73.0 in | Wt 192.2 lb

## 2013-03-02 DIAGNOSIS — E785 Hyperlipidemia, unspecified: Secondary | ICD-10-CM

## 2013-03-02 DIAGNOSIS — I739 Peripheral vascular disease, unspecified: Secondary | ICD-10-CM | POA: Diagnosis not present

## 2013-03-02 DIAGNOSIS — I10 Essential (primary) hypertension: Secondary | ICD-10-CM

## 2013-03-02 DIAGNOSIS — I25810 Atherosclerosis of coronary artery bypass graft(s) without angina pectoris: Secondary | ICD-10-CM

## 2013-03-02 DIAGNOSIS — J309 Allergic rhinitis, unspecified: Secondary | ICD-10-CM

## 2013-03-02 DIAGNOSIS — I714 Abdominal aortic aneurysm, without rupture, unspecified: Secondary | ICD-10-CM | POA: Diagnosis not present

## 2013-03-02 DIAGNOSIS — F17200 Nicotine dependence, unspecified, uncomplicated: Secondary | ICD-10-CM

## 2013-03-02 DIAGNOSIS — Z720 Tobacco use: Secondary | ICD-10-CM

## 2013-03-02 DIAGNOSIS — J329 Chronic sinusitis, unspecified: Secondary | ICD-10-CM

## 2013-03-02 MED ORDER — IPRATROPIUM BROMIDE 0.06 % NA SOLN: 2.0000 | Freq: Two times a day (BID) | NASAL | Status: DC

## 2013-03-02 MED ORDER — ROSUVASTATIN CALCIUM 10 MG PO TABS: ORAL_TABLET | ORAL | Status: DC

## 2013-03-02 MED ORDER — METOPROLOL TARTRATE 50 MG PO TABS: ORAL_TABLET | ORAL | Status: DC

## 2013-03-02 NOTE — Progress Notes (Signed)
Subjective:    Patient ID: Seth [name], male    DOB: 1945-07-09, 68 y.o.   MRN: [id number]  HPI Seth [name] is a 68 y.o. male  Here for follow up. Last ov 12/01/12.   HTN -  lisinopril 5 MG tablet added to regimen as borderline high here last ov, but also elevated out of office and goal of tight blood control with vascular disease and AAA. Plan to recheck in few weeks.  Lopressor taking total 75mg  BID, lisinopril total 15mg  qd. Rare home BP's: 135-142/70's, at similar readings at other doctor's offices. Home BP this am - 138/78. No chest pains. Feels like urinating more at times with additional 5mg  lisinopril.   CAD, Hyperlipidemia- Continued Crestor last ov. Hx abnormal stress test , s/p cath March 2014 - no significant obstructive CAD, medical mgt recommended by cardiology.  Minor to moderate diffuse coronary irregularities, but no flow-limiting CAD present, EF 65%.  Dr. Carolyne Littles - cardiologist, plans on recheck in a year. Not fasting today.   Lab Results  Component Value Date   CHOL 152 05/27/2012   HDL 40 05/27/2012   LDLCALC 93 05/27/2012   TRIG 93 05/27/2012   CHOLHDL 3.8 05/27/2012   AAA (abdominal aortic aneurysm) without rupture, hx of PVD, stable by report prior (told was not significantly increased from 3.9 to 4.1) - plans on repeat ultrasound scan in 6 mo with plan of continued periodic monitoring, and PVD followed by vascular surgeon,  S/p pelvic artery stenting.  Still on plavix, occasional ASA.  Scheduled for repeat scan on 03/16/13, with vascular follow up 3 days later. Dr. Octaviano Batty.   Tobacco abuse - cessation discussed prior, especially in light of above problems. Smoking 10 cigarettes per day, but plans on electronic cigarettes. Smoked less when used as a trial this past weekend. Did not meet with smoking cessation clinic d/t scheduling conflict.  Allergic rhinitis - (ATROVENT) 0.06 % nasal spray trial last ov as unable to take steroid nasal sprays and not controlled with Zyrtec.  Made him feel weird - for about an hour after using the spray. Had been using 2 sprays, 4 times per day. Started having more soreness in throat/raw feeling. Cut back to twice per day - less sore now. Pressure/pain in sinuses worse with 4 times per day., less with 2 times per day, no discolored nasal d/c, pnd and drainage constantly.  Has seen allergist - still taking zyrtec 10mg  qd, zantac 75mg  BID.  Allergist also recommended ENT eval. No f/c.    Patient Active Problem List   Diagnosis Date Noted  . AAA (abdominal aortic aneurysm) without rupture 10/22/2012  . Nicotine addiction 10/17/2012  . Atherosclerosis of native arteries of the extremities with ulceration(440.23) 10/17/2012  . Carotid bruit 10/17/2012  . Other nonspecific abnormal cardiovascular system function study 04/10/2012  . History of sciatica   . Essential hypertension, benign   . Seasonal allergies   . HTN (hypertension) 04/04/2011  . Abdominal aortic aneurysm 11/03/2010   Past Medical History  Diagnosis Date  . History of sciatica   . Essential hypertension, benign   . Seasonal allergies   . Back pain   . Hyperlipidemia    No past surgical history on file. Allergies  Allergen Reactions  . Biaxin [Clarithromycin] Shortness Of Breath  . Clarithromycin Shortness Of Breath  . Iodinated Diagnostic Agents Hives  . Cortisone Other (See Comments)    Turns red from breast up  . Iodine Hives    Dye from  nuclear medicine test  . Prednisone    Prior to Admission medications   Medication Sig Start Date End Date Taking? Authorizing Provider  aspirin EC 81 MG tablet Take 81 mg by mouth daily.   Yes Historical Provider, MD  cetirizine (ZYRTEC) 10 MG tablet Take 10 mg by mouth daily.   Yes Historical Provider, MD  clopidogrel (PLAVIX) 75 MG tablet Take 75 mg by mouth daily.   Yes Historical Provider, MD  ipratropium (ATROVENT) 0.06 % nasal spray Place 2 sprays into the nose 4 (four) times daily. 12/01/12  Yes [name], MD  lisinopril (PRINIVIL,ZESTRIL) 10 MG tablet Take 1 tablet (10 mg total) by mouth daily. 08/25/12 08/25/13 Yes [name], MD  lisinopril (PRINIVIL,ZESTRIL) 5 MG tablet Take 1 tablet (5 mg total) by mouth daily. 12/01/12  Yes [name], MD  metoprolol (LOPRESSOR) 50 MG tablet Take 1 1/2 tablets by mouth twice daily. PATIENT NEEDS OFFICE VISIT FOR ADDITIONAL REFILLS 02/04/13  Yes [name], MD  ranitidine (ZANTAC) 75 MG tablet Take 75 mg by mouth 2 (two) times daily.   Yes Historical Provider, MD  rosuvastatin (CRESTOR) 10 MG tablet TAKE 1 TABLET (10 MG TOTAL) BY MOUTH DAILY. 08/25/12  Yes [name], MD  benzonatate (TESSALON) 100 MG capsule Take 1 capsule (100 mg total) by mouth 3 (three) times daily as needed for cough. 03/24/12   [name], MD  chlorpheniramine-HYDROcodone [facility] PENNKINETIC ER) 10-8 MG/5ML LQCR Take 5 mLs by mouth every 12 (twelve) hours as needed (cough). 03/24/12   [name], MD  oseltamivir (TAMIFLU) 75 MG capsule Take 1 capsule (75 mg total) by mouth 2 (two) times daily. 03/13/12   Ellison Carwin, MD   History   Social History  . Marital Status: Married    Spouse Name: N/A    Number of Children: N/A  . Years of Education: N/A   Occupational History  . Not on file.   Social History Main Topics  . Smoking status: Current Every Day Smoker -- 0.50 packs/day for 50 years    Types: Cigarettes  . Smokeless tobacco: Not on file  . Alcohol Use: Yes     Comment: sometimes mixed drinks or beers  . Drug Use: Not on file  . Sexual Activity: Not on file   Other Topics Concern  . Not on file   Social History Narrative  . No narrative on file    Review of Systems  Constitutional: Negative for fatigue and unexpected weight change.  HENT: Positive for congestion, postnasal drip and sinus pressure.   Eyes: Negative for visual disturbance.  Respiratory: Negative for cough, chest tightness and shortness of breath.     Cardiovascular: Negative for chest pain, palpitations and leg swelling.  Gastrointestinal: Negative for abdominal pain and blood in stool.  Neurological: Negative for dizziness, weakness, light-headedness and headaches.       Objective:   Physical Exam  Vitals reviewed. Constitutional: He is oriented to person, place, and time. He appears well-developed and well-nourished.  HENT:  Head: Normocephalic and atraumatic.  Nose: Mucosal edema present. Right sinus exhibits no maxillary sinus tenderness and no frontal sinus tenderness. Left sinus exhibits no maxillary sinus tenderness and no frontal sinus tenderness.  Eyes: EOM are normal. Pupils are equal, round, and reactive to light.  Neck: No JVD present. Carotid bruit is not present.  Cardiovascular: Normal rate, regular rhythm and normal heart sounds.   No murmur heard. Pulmonary/Chest: Effort normal  and breath sounds normal. He has no rales.  Abdominal: He exhibits no pulsatile midline mass.  Musculoskeletal: He exhibits no edema.  Neurological: He is alert and oriented to person, place, and time.  Skin: Skin is warm and dry.  Psychiatric: He has a normal mood and affect. His behavior is normal.   Filed Vitals:   03/02/13 1340  BP: 162/80  Pulse: 71  Temp: 98 F (36.7 C)  TempSrc: Oral  Resp: 16  Height: 6\' 1"  (1.854 m)  Weight: 192 lb 3.2 oz (87.181 kg)  SpO2: 99%       Assessment & Plan:   Seth [name] is a 68 y.o. male HTN (hypertension) - Plan: Lipid panel, COMPLETE METABOLIC PANEL WITH GFR, metoprolol (LOPRESSOR) 50 MG tablet. Elevated here, but may be component of white coat htn. Check home readings and if remains elevated, call to discuss changes in regimen.   AAA (abdominal aortic aneurysm) without rupture, Peripheral vascular disease - Plan: Lipid panel, COMPLETE METABOLIC PANEL WITH GFR, rosuvastatin (CRESTOR) 10 MG tablet.  Up with vascular surgery as planned.   CAD (coronary artery disease) of artery bypass  graft - Plan: Lipid panel, COMPLETE METABOLIC PANEL WITH GFR, rosuvastatin (CRESTOR) 10 MG tablet. Asx. Follow up with cardiologist as planned.   Tobacco abuse - again strongly encouraged contineud attempts at cessation, especially with PVD, AAA, HTN, CAD. Can discuss e-cigs with vascualr and cardiologist, as this is likely better than cigarettes, but still nicotine.   Allergic rhinitis,  Sinusitis, chronic - Plan: Ambulatory referral to ENT for tx options. Can continue BID dosing of atrovent NS for now.   Other and unspecified hyperlipidemia - Plan: rosuvastatin (CRESTOR) 10 MG tablet refilled. Fasting lab visit only order placed.   Plan on CPE in next 6 months to discuss other health maintenance.     Meds ordered this encounter  Medications  . ipratropium (ATROVENT) 0.06 % nasal spray    Sig: Place 2 sprays into the nose 2 (two) times daily.    Dispense:  15 mL    Refill:  1  . metoprolol (LOPRESSOR) 50 MG tablet    Sig: Take 1 1/2 tablets by mouth twice daily.    Dispense:  145 tablet    Refill:  1  . rosuvastatin (CRESTOR) 10 MG tablet    Sig: TAKE 1 TABLET (10 MG TOTAL) BY MOUTH DAILY.    Dispense:  90 tablet    Refill:  1   Patient Instructions  Keep a record of your blood pressures outside of the office and bring them to the next office visit with your vascular surgeon and me. If remaining over 140/90 at home, will need to change medicines again. Return to the clinic or go to the nearest emergency room if any of your symptoms worsen or new symptoms occur.  Fasting lab visit in next week.   Continue to cut back on cigarettes. Can discuss the electronic cigarettes with cardiologist and vascular surgeon, but would continue to work to no nicotine.   Recheck in next 6 months, sooner if needed.

## 2013-03-02 NOTE — Patient Instructions (Addendum)
Keep a record of your blood pressures outside of the office and bring them to the next office visit with your vascular surgeon and me. If remaining over 140/90 at home, will need to change medicines again. Return to the clinic or go to the nearest emergency room if any of your symptoms worsen or new symptoms occur.  Fasting lab visit in next week.   Continue to cut back on cigarettes. Can discuss the electronic cigarettes with cardiologist and vascular surgeon, but would continue to work to no nicotine.   Recheck in next 6 months, sooner if needed.

## 2013-03-05 DIAGNOSIS — J320 Chronic maxillary sinusitis: Secondary | ICD-10-CM | POA: Diagnosis not present

## 2013-03-05 DIAGNOSIS — J309 Allergic rhinitis, unspecified: Secondary | ICD-10-CM | POA: Diagnosis not present

## 2013-03-16 DIAGNOSIS — I739 Peripheral vascular disease, unspecified: Secondary | ICD-10-CM | POA: Diagnosis not present

## 2013-03-16 DIAGNOSIS — I70299 Other atherosclerosis of native arteries of extremities, unspecified extremity: Secondary | ICD-10-CM | POA: Diagnosis not present

## 2013-03-16 DIAGNOSIS — I658 Occlusion and stenosis of other precerebral arteries: Secondary | ICD-10-CM | POA: Diagnosis not present

## 2013-03-19 DIAGNOSIS — I714 Abdominal aortic aneurysm, without rupture, unspecified: Secondary | ICD-10-CM | POA: Diagnosis not present

## 2013-03-19 DIAGNOSIS — I6529 Occlusion and stenosis of unspecified carotid artery: Secondary | ICD-10-CM | POA: Diagnosis not present

## 2013-03-19 DIAGNOSIS — I70209 Unspecified atherosclerosis of native arteries of extremities, unspecified extremity: Secondary | ICD-10-CM | POA: Diagnosis not present

## 2013-03-19 DIAGNOSIS — L608 Other nail disorders: Secondary | ICD-10-CM | POA: Diagnosis not present

## 2013-03-27 DIAGNOSIS — J329 Chronic sinusitis, unspecified: Secondary | ICD-10-CM | POA: Diagnosis not present

## 2013-03-27 DIAGNOSIS — J310 Chronic rhinitis: Secondary | ICD-10-CM | POA: Diagnosis not present

## 2013-05-18 ENCOUNTER — Other Ambulatory Visit: Payer: Self-pay | Admitting: Family Medicine

## 2013-05-18 NOTE — Telephone Encounter (Signed)
Refilled.  Follow up as planned

## 2013-05-18 NOTE — Telephone Encounter (Signed)
Ok to refill until appt in August?

## 2013-05-20 ENCOUNTER — Telehealth: Payer: Self-pay

## 2013-05-20 NOTE — Telephone Encounter (Signed)
Patient states he received a call today and the phone stopped ringing before he could answer. Please return call and advise. Thank you.

## 2013-06-05 DIAGNOSIS — M25579 Pain in unspecified ankle and joints of unspecified foot: Secondary | ICD-10-CM | POA: Diagnosis not present

## 2013-06-12 ENCOUNTER — Other Ambulatory Visit: Payer: Self-pay | Admitting: Family Medicine

## 2013-09-07 ENCOUNTER — Encounter: Payer: Self-pay | Admitting: Family Medicine

## 2013-09-07 ENCOUNTER — Ambulatory Visit (INDEPENDENT_AMBULATORY_CARE_PROVIDER_SITE_OTHER): Payer: Medicare Other | Admitting: Family Medicine

## 2013-09-07 VITALS — BP 140/80 | HR 61 | Temp 97.8°F | Resp 16 | Ht 72.5 in | Wt 193.6 lb

## 2013-09-07 DIAGNOSIS — F17200 Nicotine dependence, unspecified, uncomplicated: Secondary | ICD-10-CM | POA: Diagnosis not present

## 2013-09-07 DIAGNOSIS — I714 Abdominal aortic aneurysm, without rupture, unspecified: Secondary | ICD-10-CM

## 2013-09-07 DIAGNOSIS — I25810 Atherosclerosis of coronary artery bypass graft(s) without angina pectoris: Secondary | ICD-10-CM | POA: Diagnosis not present

## 2013-09-07 DIAGNOSIS — E785 Hyperlipidemia, unspecified: Secondary | ICD-10-CM

## 2013-09-07 DIAGNOSIS — Z125 Encounter for screening for malignant neoplasm of prostate: Secondary | ICD-10-CM

## 2013-09-07 DIAGNOSIS — Z720 Tobacco use: Secondary | ICD-10-CM

## 2013-09-07 DIAGNOSIS — I10 Essential (primary) hypertension: Secondary | ICD-10-CM

## 2013-09-07 DIAGNOSIS — Z0000 Encounter for general adult medical examination without abnormal findings: Secondary | ICD-10-CM

## 2013-09-07 DIAGNOSIS — Z1211 Encounter for screening for malignant neoplasm of colon: Secondary | ICD-10-CM | POA: Diagnosis not present

## 2013-09-07 DIAGNOSIS — R350 Frequency of micturition: Secondary | ICD-10-CM | POA: Diagnosis not present

## 2013-09-07 DIAGNOSIS — H539 Unspecified visual disturbance: Secondary | ICD-10-CM

## 2013-09-07 DIAGNOSIS — I739 Peripheral vascular disease, unspecified: Secondary | ICD-10-CM

## 2013-09-07 LAB — POCT UA - MICROSCOPIC ONLY
CASTS, UR, LPF, POC: NEGATIVE
CRYSTALS, UR, HPF, POC: NEGATIVE
Epithelial cells, urine per micros: NEGATIVE
Mucus, UA: NEGATIVE
WBC, Ur, HPF, POC: NEGATIVE
YEAST UA: NEGATIVE

## 2013-09-07 LAB — POCT URINALYSIS DIPSTICK
Bilirubin, UA: NEGATIVE
Blood, UA: NEGATIVE
Glucose, UA: NEGATIVE
Ketones, UA: NEGATIVE
LEUKOCYTES UA: NEGATIVE
Nitrite, UA: NEGATIVE
PROTEIN UA: NEGATIVE
Spec Grav, UA: 1.01
UROBILINOGEN UA: 0.2
pH, UA: 7

## 2013-09-07 MED ORDER — LISINOPRIL 5 MG PO TABS
5.0000 mg | ORAL_TABLET | Freq: Every day | ORAL | Status: DC
Start: 2013-09-07 — End: 2014-03-15

## 2013-09-07 MED ORDER — LISINOPRIL 10 MG PO TABS: ORAL_TABLET | ORAL | Status: DC

## 2013-09-07 MED ORDER — ROSUVASTATIN CALCIUM 10 MG PO TABS: ORAL_TABLET | ORAL | Status: DC

## 2013-09-07 NOTE — Progress Notes (Addendum)
This chart was scribed for [name], MD by Einar Pheasant, ED Scribe. This patient was seen in room 25 and the patient's care was started at 2:47 PM.  Subjective:    Patient ID: Seth [name], male    DOB: 1946-01-22, 68 y.o.   MRN: [id number]  Chief Complaint  Patient presents with  . Annual Exam    medication refills - LISINOPRIL 5 MG AND  LISINOPRIL 10 MG, CRESTOR    HPI Seth [name] is a 68 y.o. male  Pt is here for a Complete physical exam . He is also requesting a refill of his Lisinopril 5 mg and Lisinopril 10 mg, Crestor.   Last seen in January he has a history of HTN, CAD, hyperlipidemia, AAA.  Health maintenance:  Screening for falls and depression: were completed without positive findings Colonoscopy: pt does not recall last date of colonoscopy. He denies any personal of family history of colon cancer. Will give pt a referral to local specialist. Pt advised and he agrees with the proposed treatment plan. Immunizations: pt refuses to get a pneumonia vaccine at the time. He states that following immunizations he has adverse effects, especially with the flu vaccine. Pt was given more information on the vaccine. He also wants to hold off on the Shingles vaccination as well. Prostate cancer screening: last screening of in March 2013 and PSA was 2.93. Pt was advised to get a screening today.   **Seth [name] reports increased urinary frequency which he states has been about the same as his past reports. He denies any hematuria.  Advanced directives: he and his wife have a will but not sure if this is a living will that discusses directives. He is full code but would not want prolonged ventilator support if no reasonable chance of recovery. Dental: pt states that he does not need to see one because he has all false teeth. Vision: pt has not seen a eye specialist recently, he states that it has been about 20 years since his last appointment. He has noticed that he has been wearing  his glasses more than he used to. Seth [name] will be given a referral to a  Local specialist.  Pt agrees with the course of treatment.  CAD, AAA: he is followed by DR. Skains. Cardiac cath march without any obstructive CAD and EF 65 percent.  in regards to his AAA size of 3.9-4.1 on prior ultrasound most recently, with plan of repeat scan February. He takes Plavix and occasional Asprin. He is followed by Dr. Maryjean Morn for PVD and his is status post pelvic artery stenting.   **Pt states that he has been doing some genealogical research and he discovered that there is a family history of heart disease on both sides of his family.  Pt states that his cardiologist wants him to see him once a year and Dr. Maryjean Morn wants him to be seen every 6 months by him. Seth [name] states that the last time he was seen by his vein specialist all test came back normal and he was told that his blood flow was good.   HTN: Pt states that he has been keeping up with his BP. He states that his blood pressures have been in the range of 130-140, with the exception of a reading of 175 when he had a Gout flare up. Pt has been religiously taking his medication and he denies any medicinal side effects.   Tobacco abuse: was tapering down use with plan of  electronic cigarette but had not met with the smoking cessation clinic at that time. Currently pt states that he is still smoking 10-15 cigarettes a day. He states that he has not been able to follow up with the smoking cessation clinic. Pt will be given the clinic's contact at the end of this visit. He states that he talked to his cardiologist about using electronic cigarettes and he was told that they were better than the cigarettes but did not make a recommendation.   Hyperlipidimia: most recent labs in our system from April 2014 which were normal. He takes Crestor qd followed by cardiologist as above. Pt is fasting today.   Sinusitis: Last visit pt was referred to ENT. He was told to use a  salt water nasal spray. Pt states that the specialist found an infection the last time he was seen and was given a 6 month follow up.   PCP: [name], MD  Patient Active Problem List   Diagnosis Date Noted  . AAA (abdominal aortic aneurysm) without rupture 10/22/2012  . Nicotine addiction 10/17/2012  . Atherosclerosis of native arteries of the extremities with ulceration(440.23) 10/17/2012  . Carotid bruit 10/17/2012  . Other nonspecific abnormal cardiovascular system function study 04/10/2012  . History of sciatica   . Essential hypertension, benign   . Seasonal allergies   . HTN (hypertension) 04/04/2011  . Abdominal aortic aneurysm 11/03/2010   Past Medical History  Diagnosis Date  . History of sciatica   . Essential hypertension, benign   . Seasonal allergies   . Back pain   . Hyperlipidemia    No past surgical history on file. Allergies  Allergen Reactions  . Biaxin [Clarithromycin] Shortness Of Breath  . Clarithromycin Shortness Of Breath  . Iodinated Diagnostic Agents Hives  . Cortisone Other (See Comments)    Turns red from breast up  . Iodine Hives    Dye from nuclear medicine test  . Prednisone    Prior to Admission medications   Medication Sig Start Date End Date Taking? Authorizing Provider  aspirin EC 81 MG tablet Take 81 mg by mouth daily.   Yes Historical Provider, MD  cetirizine (ZYRTEC) 10 MG tablet Take 10 mg by mouth daily.   Yes Historical Provider, MD  clopidogrel (PLAVIX) 75 MG tablet Take 75 mg by mouth daily.   Yes Historical Provider, MD  lisinopril (PRINIVIL,ZESTRIL) 10 MG tablet TAKE 1 TABLET BY MOUTH DAILY. 05/18/13  Yes Eleanore E Egan, PA-C  lisinopril (PRINIVIL,ZESTRIL) 5 MG tablet Take 1 tablet (5 mg total) by mouth daily. 12/01/12  Yes [name], MD  metoprolol (LOPRESSOR) 50 MG tablet TAKE 1 AND 1/2 TABLET BY MOUTH TWICE A DAY 06/12/13  Yes [name], MD  ranitidine (ZANTAC) 75 MG tablet Take 75 mg by mouth 2 (two)  times daily.   Yes Historical Provider, MD  rosuvastatin (CRESTOR) 10 MG tablet TAKE 1 TABLET (10 MG TOTAL) BY MOUTH DAILY. 03/02/13  Yes [name], MD  SALINE NA Place into the nose as needed.   Yes Historical Provider, MD  ipratropium (ATROVENT) 0.06 % nasal spray Place 2 sprays into the nose 2 (two) times daily. 03/02/13   [name], MD  oseltamivir (TAMIFLU) 75 MG capsule Take 1 capsule (75 mg total) by mouth 2 (two) times daily. 03/13/12   Ellison Carwin, MD   History   Social History  . Marital Status: Married    Spouse Name: N/A    Number of  Children: N/A  . Years of Education: N/A   Occupational History  . Not on file.   Social History Main Topics  . Smoking status: Current Every Day Smoker -- 0.50 packs/day for 50 years    Types: Cigarettes  . Smokeless tobacco: Not on file  . Alcohol Use: Yes     Comment: sometimes mixed drinks or beers  . Drug Use: Not on file  . Sexual Activity: Not on file   Other Topics Concern  . Not on file   Social History Narrative  . No narrative on file   Review of Systems  Constitutional: Negative for fatigue and unexpected weight change.  Eyes: Negative for visual disturbance.  Respiratory: Negative for cough, chest tightness and shortness of breath.   Cardiovascular: Negative for chest pain, palpitations and leg swelling.  Gastrointestinal: Negative for abdominal pain and blood in stool.  Neurological: Negative for dizziness, light-headedness and headaches.  13 point ROS reviewed on pt's form    Objective:   Physical Exam  Vitals reviewed. Constitutional: He is oriented to person, place, and time. He appears well-developed and well-nourished.  HENT:  Head: Normocephalic and atraumatic.  Right Ear: External ear normal.  Left Ear: External ear normal.  Mouth/Throat: Oropharynx is clear and moist.  Eyes: Conjunctivae and EOM are normal. Pupils are equal, round, and reactive to light.  Neck: Normal range of motion.  Neck supple. No thyromegaly present.  Cardiovascular: Normal rate, regular rhythm, normal heart sounds and intact distal pulses.   Pulmonary/Chest: Effort normal and breath sounds normal. No respiratory distress. He has no wheezes.  Abdominal: Soft. Bowel sounds are normal. He exhibits no distension, no pulsatile midline mass and no mass. There is no tenderness. No hernia. Hernia confirmed negative in the right inguinal area and confirmed negative in the left inguinal area.  Genitourinary: Prostate normal.  No hernia appreciated. On DRE lower prostate palpated which appeared enlarged with no apperent nodule.  Musculoskeletal: Normal range of motion. He exhibits no edema and no tenderness.  Lymphadenopathy:    He has no cervical adenopathy.  Neurological: He is alert and oriented to person, place, and time. He has normal reflexes.  Skin: Skin is warm and dry.  Psychiatric: He has a normal mood and affect. His behavior is normal.    Filed Vitals:   09/07/13 1413  BP: 140/80  Pulse: 61  Temp: 97.8 F (36.6 C)  TempSrc: Oral  Resp: 16  Height: 6' 0.5" (1.842 m)  Weight: 193 lb 9.6 oz (87.816 kg)  SpO2: 94%    Visual Acuity Screening   Right eye Left eye Both eyes  Without correction:     With correction: 20/40 20/40 20/40   Results for orders placed in visit on 09/07/13  POCT UA - MICROSCOPIC ONLY      Result Value Ref Range   WBC, Ur, HPF, POC neg     RBC, urine, microscopic 3-5     Bacteria, U Microscopic trace     Mucus, UA neg     Epithelial cells, urine per micros neg     Crystals, Ur, HPF, POC neg     Casts, Ur, LPF, POC neg     Yeast, UA neg    POCT URINALYSIS DIPSTICK      Result Value Ref Range   Color, UA yellow     Clarity, UA clear     Glucose, UA neg     Bilirubin, UA neg     Ketones, UA  neg     Spec Grav, UA 1.010     Blood, UA neg     pH, UA 7.0     Protein, UA neg     Urobilinogen, UA 0.2     Nitrite, UA neg     Leukocytes, UA Negative       Assessment & Plan:   Seth [name] is a 68 y.o. male Annual physical exam - Plan: Ambulatory referral to Ophthalmology  -anticipatory guidance as below in AVS, screening labs above. Health maintenance items as above in HPI discussed/recommended as applicable.   Essential hypertension - Plan: lisinopril (PRINIVIL,ZESTRIL) 10 MG tablet, lisinopril (PRINIVIL,ZESTRIL) 5 MG tablet  -overall stable, no change in regimen for now (54m qd of lisinopril).  Peripheral vascular disease - Plan: rosuvastatin (CRESTOR) 10 MG tablet, Lipid panel, COMPLETE METABOLIC PANEL WITH GFR  -stable. Cont plavix, ASA, and follow up with vascular specialist.   Other and unspecified hyperlipidemia - Plan: rosuvastatin (CRESTOR) 10 MG tablet, CANCELED: Comprehensive metabolic panel, CANCELED: Lipid panel  -prior controlled, continued on Crestor 136mqd. Lipid panel pending.   Tobacco abuse  -counseled on importance of cessation, classes recommended, and unable to recommend e-cigarettes as definitive cessation.  spent approx 4-5 minutes in this discussion.   Screening for colon cancer - Plan: Ambulatory referral to Gastroenterology to schedule.   Screening for prostate cancer - Plan: PSA, Medicare  -We discussed pros and cons of prostate cancer screening, and after this discussion, he chose to have screening done. PSA obtained.  Possible enlarged prostate on DRE. PSA pending, but may need to follow back up with urology.    Difficulty reading due to visual problem - Plan: Ambulatory referral to Ophthalmology  -referred to optho for eval.    Urinary frequency - Plan: POCT UA - Microscopic Only, POCT urinalysis dipstick  -as above - PSA pending, may need urology eval again.   CAD (coronary artery disease)   -medical mgt for now. Continue follow up with cardiologist.   AAA (abdominal aortic aneurysm) without rupture  -stable by his report of most recent U/S.  Cont vascular follow up.   Meds ordered this  encounter  Medications  . SALINE NA    Sig: Place into the nose as needed.  . Marland Kitchenisinopril (PRINIVIL,ZESTRIL) 10 MG tablet    Sig: TAKE 1 TABLET BY MOUTH DAILY.    Dispense:  90 tablet    Refill:  1  . lisinopril (PRINIVIL,ZESTRIL) 5 MG tablet    Sig: Take 1 tablet (5 mg total) by mouth daily.    Dispense:  90 tablet    Refill:  3  . rosuvastatin (CRESTOR) 10 MG tablet    Sig: TAKE 1 TABLET (10 MG TOTAL) BY MOUTH DAILY.    Dispense:  90 tablet    Refill:  1   Patient Instructions  [place] offers smoking cessation clinics. Registration is required. To register call [phone] register online at [email or web address] should receive a call or letter about your lab results within the next week to 10 days.  Keep follow up with your vascular specialist, and cardiologist.  We will refer you to eye care provider, and gastroenterologist to schedule colonoscopy.  We may be referring you back to urology, but PSA level is pending. Only a few red blood cells on urine today, but again - may need to see them in follow up.   Plan on recheck in 6 months.  Return to the clinic or go to the nearest  emergency room if any of your symptoms worsen or new symptoms occur.   Keeping you healthy  Get these tests  Blood pressure- Have your blood pressure checked once a year by your healthcare provider.  Normal blood pressure is 120/80  Weight- Have your body mass index (BMI) calculated to screen for obesity.  BMI is a measure of body fat based on height and weight. You can also calculate your own BMI at [email or web address].  Cholesterol- Have your cholesterol checked every year.  Diabetes- Have your blood sugar checked regularly if you have high blood pressure, high cholesterol, have a family history of diabetes or if you are overweight.  Screening for Colon Cancer- Colonoscopy starting at age 42.  Screening may begin sooner depending on your family history and other health conditions. Follow up  colonoscopy as directed by your Gastroenterologist.  Screening for Prostate Cancer- Both blood work (PSA) and a rectal exam help screen for Prostate Cancer.  Screening begins at age 8 with African-American men and at age 24 with Caucasian men.  Screening may begin sooner depending on your family history.  Take these medicines  Aspirin- One aspirin daily can help prevent Heart disease and Stroke.  Flu shot- Every fall.  Tetanus- Every 10 years.  Zostavax- Once after the age of 50 to prevent Shingles.If you would like to have this - let me know.   Pneumonia shot- Once after the age of 64; if you are younger than 62, ask your healthcare provider if you need a Pneumonia shot. (TWO VACCINES - PRVNAR AND PNEUMOVAX - LOOK AT CDC.GOV FOR MORE INFO ON THESE, AND LET ME KNOW IF YOU WOULD LIKE TO HAVE THESE.   Take these steps  Don't smoke- If you do smoke, talk to your doctor about quitting.  For tips on how to quit, go to www.smokefree.gov or call 1-800-QUIT-NOW.  Be physically active- Exercise 5 days a week for at least 30 minutes.  If you are not already physically active start slow and gradually work up to 30 minutes of moderate physical activity.  Examples of moderate activity include walking briskly, mowing the yard, dancing, swimming, bicycling, etc.  Eat a healthy diet- Eat a variety of healthy food such as fruits, vegetables, low fat milk, low fat cheese, yogurt, lean meant, poultry, fish, beans, tofu, etc. For more information go to www.thenutritionsource.org  Drink alcohol in moderation- Limit alcohol intake to less than two drinks a day. Never drink and drive.  Dentist- Brush and floss twice daily; visit your dentist twice a year.  Depression- Your emotional health is as important as your physical health. If you're feeling down, or losing interest in things you would normally enjoy please talk to your healthcare provider.  Eye exam- Visit your eye doctor every year.  Safe sex- If  you may be exposed to a sexually transmitted infection, use a condom.  Seat belts- Seat belts can save your life; always wear one.  Smoke/Carbon Monoxide detectors- These detectors need to be installed on the appropriate level of your home.  Replace batteries at least once a year.  Skin cancer- When out in the sun, cover up and use sunscreen 15 SPF or higher.  Violence- If anyone is threatening you, please tell your healthcare provider.  Living Will/ Health care power of attorney- Speak with your healthcare provider and family.      I personally performed the services described in this documentation, which was scribed in my presence. The recorded information has been reviewed  and considered, and addended by me as needed.

## 2013-09-07 NOTE — Patient Instructions (Addendum)
[place] offers smoking cessation clinics. Registration is required. To register call [phone] or register online at [email or web address] You should receive a call or letter about your lab results within the next week to 10 days.  Keep follow up with your vascular specialist, and cardiologist.  We will refer you to eye care provider, and gastroenterologist to schedule colonoscopy.  We may be referring you back to urology, but PSA level is pending. Only a few red blood cells on urine today, but again - may need to see them in follow up.   Plan on recheck in 6 months.  Return to the clinic or go to the nearest emergency room if any of your symptoms worsen or new symptoms occur.   Keeping you healthy  Get these tests  Blood pressure- Have your blood pressure checked once a year by your healthcare provider.  Normal blood pressure is 120/80  Weight- Have your body mass index (BMI) calculated to screen for obesity.  BMI is a measure of body fat based on height and weight. You can also calculate your own BMI at [email or web address].  Cholesterol- Have your cholesterol checked every year.  Diabetes- Have your blood sugar checked regularly if you have high blood pressure, high cholesterol, have a family history of diabetes or if you are overweight.  Screening for Colon Cancer- Colonoscopy starting at age 76.  Screening may begin sooner depending on your family history and other health conditions. Follow up colonoscopy as directed by your Gastroenterologist.  Screening for Prostate Cancer- Both blood work (PSA) and a rectal exam help screen for Prostate Cancer.  Screening begins at age 42 with African-American men and at age 41 with Caucasian men.  Screening may begin sooner depending on your family history.  Take these medicines  Aspirin- One aspirin daily can help prevent Heart disease and Stroke.  Flu shot- Every fall.  Tetanus- Every 10 years.  Zostavax- Once after the age of 75 to  prevent Shingles.If you would like to have this - let me know.   Pneumonia shot- Once after the age of 86; if you are younger than 75, ask your healthcare provider if you need a Pneumonia shot. (TWO VACCINES - PRVNAR AND PNEUMOVAX - LOOK AT CDC.GOV FOR MORE INFO ON THESE, AND LET ME KNOW IF YOU WOULD LIKE TO HAVE THESE.   Take these steps  Don't smoke- If you do smoke, talk to your doctor about quitting.  For tips on how to quit, go to www.smokefree.gov or call 1-800-QUIT-NOW.  Be physically active- Exercise 5 days a week for at least 30 minutes.  If you are not already physically active start slow and gradually work up to 30 minutes of moderate physical activity.  Examples of moderate activity include walking briskly, mowing the yard, dancing, swimming, bicycling, etc.  Eat a healthy diet- Eat a variety of healthy food such as fruits, vegetables, low fat milk, low fat cheese, yogurt, lean meant, poultry, fish, beans, tofu, etc. For more information go to www.thenutritionsource.org  Drink alcohol in moderation- Limit alcohol intake to less than two drinks a day. Never drink and drive.  Dentist- Brush and floss twice daily; visit your dentist twice a year.  Depression- Your emotional health is as important as your physical health. If you're feeling down, or losing interest in things you would normally enjoy please talk to your healthcare provider.  Eye exam- Visit your eye doctor every year.  Safe sex- If you may be exposed to a sexually transmitted  infection, use a condom.  Seat belts- Seat belts can save your life; always wear one.  Smoke/Carbon Monoxide detectors- These detectors need to be installed on the appropriate level of your home.  Replace batteries at least once a year.  Skin cancer- When out in the sun, cover up and use sunscreen 15 SPF or higher.  Violence- If anyone is threatening you, please tell your healthcare provider.  Living Will/ Health care power of attorney- Speak  with your healthcare provider and family.

## 2013-09-08 LAB — COMPLETE METABOLIC PANEL WITH GFR
ALT: 11 U/L (ref 0–53)
AST: 15 U/L (ref 0–37)
Albumin: 4.7 g/dL (ref 3.5–5.2)
Alkaline Phosphatase: 87 U/L (ref 39–117)
BUN: 8 mg/dL (ref 6–23)
CHLORIDE: 100 meq/L (ref 96–112)
CO2: 24 mEq/L (ref 19–32)
Calcium: 9.6 mg/dL (ref 8.4–10.5)
Creat: 1.06 mg/dL (ref 0.50–1.35)
GFR, EST AFRICAN AMERICAN: 83 mL/min
GFR, EST NON AFRICAN AMERICAN: 72 mL/min
GLUCOSE: 81 mg/dL (ref 70–99)
POTASSIUM: 4.5 meq/L (ref 3.5–5.3)
Sodium: 136 mEq/L (ref 135–145)
Total Bilirubin: 0.7 mg/dL (ref 0.2–1.2)
Total Protein: 7.6 g/dL (ref 6.0–8.3)

## 2013-09-08 LAB — LIPID PANEL
CHOLESTEROL: 156 mg/dL (ref 0–200)
HDL: 37 mg/dL — ABNORMAL LOW (ref >39)
LDL Cholesterol: 94 mg/dL (ref 0–99)
Total CHOL/HDL Ratio: 4.2 Ratio
Triglycerides: 124 mg/dL (ref <150)
VLDL: 25 mg/dL (ref 0–40)

## 2013-09-08 LAB — PSA, MEDICARE: PSA: 2.36 ng/mL (ref <=4.00)

## 2013-09-09 ENCOUNTER — Encounter: Payer: Self-pay | Admitting: Nurse Practitioner

## 2013-09-11 ENCOUNTER — Other Ambulatory Visit: Payer: Self-pay | Admitting: Family Medicine

## 2013-09-11 DIAGNOSIS — H2510 Age-related nuclear cataract, unspecified eye: Secondary | ICD-10-CM | POA: Diagnosis not present

## 2013-09-15 DIAGNOSIS — I658 Occlusion and stenosis of other precerebral arteries: Secondary | ICD-10-CM | POA: Diagnosis not present

## 2013-09-15 DIAGNOSIS — I739 Peripheral vascular disease, unspecified: Secondary | ICD-10-CM | POA: Diagnosis not present

## 2013-09-18 ENCOUNTER — Encounter: Payer: Self-pay | Admitting: *Deleted

## 2013-09-24 ENCOUNTER — Encounter: Payer: Self-pay | Admitting: Nurse Practitioner

## 2013-09-24 ENCOUNTER — Ambulatory Visit (INDEPENDENT_AMBULATORY_CARE_PROVIDER_SITE_OTHER): Payer: Medicare Other | Admitting: Nurse Practitioner

## 2013-09-24 VITALS — BP 140/86 | HR 76 | Ht 72.0 in | Wt 193.1 lb

## 2013-09-24 DIAGNOSIS — Z1211 Encounter for screening for malignant neoplasm of colon: Secondary | ICD-10-CM | POA: Diagnosis not present

## 2013-09-24 DIAGNOSIS — D689 Coagulation defect, unspecified: Secondary | ICD-10-CM | POA: Diagnosis not present

## 2013-09-24 DIAGNOSIS — I25810 Atherosclerosis of coronary artery bypass graft(s) without angina pectoris: Secondary | ICD-10-CM | POA: Diagnosis not present

## 2013-09-24 NOTE — Progress Notes (Signed)
HPI :   Patient is a 68 year old male, new to this practice, referred for evaluation of colon cancer screening. He has a history of hypertension, coronary artery disease/stents, abdominal aortic aneurysm, and tobacco abuse. Patient has never had a colonoscopy, he has no family history colon cancer. No gastrointestinal complaints. Specifically he has no bowel changes, rectal bleeding, weight loss or abdominal pain.  Past Medical History  Diagnosis Date  . History of sciatica   . Essential hypertension, benign   . Seasonal allergies   . Back pain   . Hyperlipidemia   . Gout   . CAD (coronary artery disease)   . AAA (abdominal aortic aneurysm)   . PVD (peripheral vascular disease)   . Anxiety   . Allergic rhinitis     Family History  Problem Relation Age of Onset  . Hypertension Mother   . Heart disease Paternal Grandmother   . Heart disease Mother   . Heart disease Maternal Uncle     x 2  . Cancer Maternal Grandmother     type unknown, ? lung  . Leukemia Maternal Uncle    History  Substance Use Topics  . Smoking status: Current Every Day Smoker -- 0.50 packs/day for 50 years    Types: Cigarettes  . Smokeless tobacco: Never Used  . Alcohol Use: Yes     Comment: sometimes mixed drinks or beers   Current Outpatient Prescriptions  Medication Sig Dispense Refill  . aspirin EC 81 MG tablet Take 81 mg by mouth daily.      . cetirizine (ZYRTEC) 10 MG tablet Take 10 mg by mouth daily.      . clopidogrel (PLAVIX) 75 MG tablet Take 75 mg by mouth daily.      Marland Kitchen lisinopril (PRINIVIL,ZESTRIL) 10 MG tablet TAKE 1 TABLET BY MOUTH DAILY.  90 tablet  1  . lisinopril (PRINIVIL,ZESTRIL) 5 MG tablet Take 1 tablet (5 mg total) by mouth daily.  90 tablet  3  . metoprolol (LOPRESSOR) 50 MG tablet TAKE 1 AND 1/2 TABLET BY MOUTH TWICE A DAY  270 tablet  0  . ranitidine (ZANTAC) 75 MG tablet Take 75 mg by mouth 2 (two) times daily.      . rosuvastatin (CRESTOR) 10 MG tablet TAKE 1 TABLET (10  MG TOTAL) BY MOUTH DAILY.  90 tablet  1  . SALINE NA Place into the nose as needed.       No current facility-administered medications for this visit.   Allergies  Allergen Reactions  . Biaxin [Clarithromycin] Shortness Of Breath  . Clarithromycin Shortness Of Breath  . Iodinated Diagnostic Agents Hives  . Cortisone Other (See Comments)    Turns red from breast up  . Iodine Hives    Dye from nuclear medicine test  . Prednisone      Review of Systems: Positive for allergy/sinus trouble, anxiety and fatigue. All other systems reviewed and negative except where noted in HPI.    Physical Exam: BP 140/86  Pulse 76  Ht 6' (1.829 m)  Wt 193 lb 2 oz (87.601 kg)  BMI 26.19 kg/m2 Constitutional: Pleasant,well-developed, white male in no acute distress. HEENT: Normocephalic and atraumatic. Conjunctivae are normal. No scleral icterus. Neck supple.  Cardiovascular: Normal rate, regular rhythm.  Pulmonary/chest: Effort normal and breath sounds normal. No wheezing, rales or rhonchi. Abdominal: Soft, nondistended, nontender. Bowel sounds active throughout. There are no masses palpable. No hepatomegaly. Extremities: no edema Lymphadenopathy: No cervical adenopathy noted. Neurological: Alert and oriented  to person place and time. Skin: Skin is warm and dry. No rashes noted. Psychiatric: Normal mood and affect. Behavior is normal.   ASSESSMENT AND PLAN: 64. 68 year old male for colon cancer screening. The risks, benefits, and alternatives to colonoscopy with possible biopsy and possible polypectomy were discussed with the patient and he consents to proceed.   2. CAD / PCI (? 2013).. Cath March 2014 -no significant obstructive coronary artery disease, medical management recommended. Ejection fraction 65%.  Patient is on Plavix, will contact prescribing provider about holding medication prior to colonoscopy  3. Abdominal aortic aneurysm, less than 4 cm by last ultrasound September  2013

## 2013-09-24 NOTE — Patient Instructions (Addendum)
You have been given a separate informational sheet regarding your tobacco use, the importance of quitting and local resources to help you quit. You have been scheduled for a colonoscopy. Please follow written instructions given to you at your visit today.   If you use inhalers (even only as needed), please bring them with you on the day of your procedure. Your physician has requested that you go to www.startemmi.com and enter the access code given to you at your visit today. This web site gives a general overview about your procedure. However, you should still follow specific instructions given to you by our office regarding your preparation for the procedure.

## 2013-09-25 ENCOUNTER — Encounter: Payer: Self-pay | Admitting: Nurse Practitioner

## 2013-09-25 DIAGNOSIS — Z1211 Encounter for screening for malignant neoplasm of colon: Secondary | ICD-10-CM | POA: Insufficient documentation

## 2013-09-25 NOTE — Progress Notes (Signed)
Agree with assessment and plans 

## 2013-09-29 ENCOUNTER — Encounter: Payer: Self-pay | Admitting: *Deleted

## 2013-09-29 DIAGNOSIS — H269 Unspecified cataract: Secondary | ICD-10-CM

## 2013-10-05 DIAGNOSIS — I714 Abdominal aortic aneurysm, without rupture, unspecified: Secondary | ICD-10-CM | POA: Diagnosis not present

## 2013-10-05 DIAGNOSIS — I10 Essential (primary) hypertension: Secondary | ICD-10-CM | POA: Diagnosis not present

## 2013-10-05 DIAGNOSIS — I6529 Occlusion and stenosis of unspecified carotid artery: Secondary | ICD-10-CM | POA: Diagnosis not present

## 2013-10-05 DIAGNOSIS — I70209 Unspecified atherosclerosis of native arteries of extremities, unspecified extremity: Secondary | ICD-10-CM | POA: Diagnosis not present

## 2013-10-07 ENCOUNTER — Encounter: Payer: Self-pay | Admitting: Family Medicine

## 2013-10-07 ENCOUNTER — Telehealth: Payer: Self-pay | Admitting: *Deleted

## 2013-10-07 NOTE — Telephone Encounter (Signed)
I called [facility], Dr. Milinda Hirschfeld Cruz's office to advise I faxed a plavix anticoagulation letter to them on 09-24-2013 and I have not heard anything from them.  The woman that answered the phone asked me what fax number I used and she told me to fax it to [phone]. I noted on my fax this was a second request.

## 2013-10-08 NOTE — Telephone Encounter (Signed)
I received my response from Dr. Mariea Stable and called the patient to advise.  I told him to stop the Plavix on 10-13-2013 and he can resume it again on 10-21-2013.( the day after the procedure date of 9-15). Patient wrote this down and verbalized understanding the instructions.

## 2013-10-14 ENCOUNTER — Telehealth: Payer: Self-pay | Admitting: Nurse Practitioner

## 2013-10-14 NOTE — Telephone Encounter (Signed)
Clarified with patient on the day of procedure he can have clear liquids until 5 AM not 12 noon as on instructions.

## 2013-10-15 ENCOUNTER — Encounter: Payer: Self-pay | Admitting: Cardiology

## 2013-10-15 ENCOUNTER — Ambulatory Visit (INDEPENDENT_AMBULATORY_CARE_PROVIDER_SITE_OTHER): Payer: Medicare Other | Admitting: Cardiology

## 2013-10-15 VITALS — BP 160/98 | HR 64 | Ht 72.0 in | Wt 195.0 lb

## 2013-10-15 DIAGNOSIS — E7800 Pure hypercholesterolemia, unspecified: Secondary | ICD-10-CM

## 2013-10-15 DIAGNOSIS — I25810 Atherosclerosis of coronary artery bypass graft(s) without angina pectoris: Secondary | ICD-10-CM

## 2013-10-15 DIAGNOSIS — I2510 Atherosclerotic heart disease of native coronary artery without angina pectoris: Secondary | ICD-10-CM

## 2013-10-15 DIAGNOSIS — F17200 Nicotine dependence, unspecified, uncomplicated: Secondary | ICD-10-CM

## 2013-10-15 DIAGNOSIS — I714 Abdominal aortic aneurysm, without rupture, unspecified: Secondary | ICD-10-CM

## 2013-10-15 DIAGNOSIS — I739 Peripheral vascular disease, unspecified: Secondary | ICD-10-CM | POA: Diagnosis not present

## 2013-10-15 DIAGNOSIS — I10 Essential (primary) hypertension: Secondary | ICD-10-CM | POA: Diagnosis not present

## 2013-10-15 NOTE — Patient Instructions (Signed)
The current medical regimen is effective;  continue present plan and medications.  Follow up in 1 year with Dr Skains.  You will receive a letter in the mail 2 months before you are due.  Please call us when you receive this letter to schedule your follow up appointment.  

## 2013-10-15 NOTE — Progress Notes (Signed)
[facility]. [address], [address], [place]  [address] Phone: [phone] Fax:  [phone]  Date:  10/15/2013   ID:  Seth [name], DOB 1945-05-12, MRN [id number]  PCP:  [name], MD   History of Present Illness: Seth [name] is a 68 y.o. male  PCP-- Dr. Carlota Raspberry with a hx of hypertension, abdominal aortic aneurysm 3.9cm, nonobstructive CAD, and PVD. He is also followed by Dr. Maryjean Morn, vascular surgeron. Creatinine 1.01 maximum diameter of abdominal aortic aneurysm 3.9 cm, infrarenal. Cardiac catheterization 04/10/12. He had cardiac cath with noted vessel irregularities but no flow limiting disease EF 65%, LV function stable. He states he is doing well post cath without any chest pain.  Had 6 month check up with vascular. Doppler noted. ? surgery.  Dr. Nyoka Cowden has been following his sodium level very well. It is corrected after discontinuation of diuretic. His cholesterol also has been excellent. He does state that he has whitecoat hypertension, his blood pressure is elevated today but usually in Dr. Hervey Ard office is normal.    Wt Readings from Last 3 Encounters:  10/15/13 195 lb (88.451 kg)  09/24/13 193 lb 2 oz (87.601 kg)  09/07/13 193 lb 9.6 oz (87.816 kg)     Past Medical History  Diagnosis Date  . History of sciatica   . Essential hypertension, benign   . Seasonal allergies   . Back pain   . Hyperlipidemia   . Gout   . CAD (coronary artery disease)   . AAA (abdominal aortic aneurysm)   . PVD (peripheral vascular disease)   . Anxiety   . Allergic rhinitis     Past Surgical History  Procedure Laterality Date  . Cardiac catheterization      stent placement    Current Outpatient Prescriptions  Medication Sig Dispense Refill  . aspirin EC 81 MG tablet Take 81 mg by mouth daily.      . cetirizine (ZYRTEC) 10 MG tablet Take 10 mg by mouth daily.      . clopidogrel (PLAVIX) 75 MG tablet Take 75 mg by mouth daily.      Marland Kitchen lisinopril (PRINIVIL,ZESTRIL) 10 MG tablet  TAKE 1 TABLET BY MOUTH DAILY.  90 tablet  1  . lisinopril (PRINIVIL,ZESTRIL) 5 MG tablet Take 1 tablet (5 mg total) by mouth daily.  90 tablet  3  . metoprolol (LOPRESSOR) 50 MG tablet TAKE 1 AND 1/2 TABLET BY MOUTH TWICE A DAY  270 tablet  0  . ranitidine (ZANTAC) 75 MG tablet Take 75 mg by mouth 2 (two) times daily.      . rosuvastatin (CRESTOR) 10 MG tablet TAKE 1 TABLET (10 MG TOTAL) BY MOUTH DAILY.  90 tablet  1  . SALINE NA Place into the nose as needed.       No current facility-administered medications for this visit.    Allergies:    Allergies  Allergen Reactions  . Biaxin [Clarithromycin] Shortness Of Breath  . Clarithromycin Shortness Of Breath  . Iodinated Diagnostic Agents Hives  . Cortisone Other (See Comments)    Turns red from breast up  . Iodine Hives    Dye from nuclear medicine test  . Prednisone     Social History:  The patient  reports that he has been smoking Cigarettes.  He has a 25 pack-year smoking history. He has never used smokeless tobacco. He reports that he drinks alcohol. He reports that he does not use illicit drugs.   Family  History  Problem Relation Age of Onset  . Hypertension Mother   . Heart disease Paternal Grandmother   . Heart disease Mother   . Heart disease Maternal Uncle     x 2  . Cancer Maternal Grandmother     type unknown, ? lung  . Leukemia Maternal Uncle     ROS:  Please see the history of present illness.   Denies any significant claudication, no change in shortness of breath, no strokelike symptoms, no chest pain, no syncope   All other systems reviewed and negative.   PHYSICAL EXAM: VS:  BP 160/98  Pulse 64  Ht 6' (1.829 m)  Wt 195 lb (88.451 kg)  BMI 26.44 kg/m2 Well nourished, well developed, in no acute distress HEENT: normal, /AT, EOMI Neck: no JVD, normal carotid upstroke, no bruit Cardiac:  normal S1, S2; RRR; no murmur Lungs:  clear to auscultation bilaterally, no wheezing, rhonchi or rales Abd: soft,  nontender, no hepatomegaly, no bruits Ext: no edema, 2+ distal pulses Skin: warm and dry GU: deferred Neuro: no focal abnormalities noted, AAO x 3  EKG:  10/15/13-sinus rhythm, 64 with no other abnormalities  ASSESSMENT AND PLAN:  1. Nonobstructive coronary artery disease-continue with aggressive secondary prevention. Aspirin. Plavix is not unreasonable with his peripheral vascular disease. He is now holding his Plavix because of upcoming colonoscopy. Statin. 2. Tobacco use-encourage cessation. He is thinking about using the vapor cigarettes. I discussed with him that often, quitting cold [place] is the best strategy. He had done this first 14 months in the past. He states that his blood pressure was quite high after he stopped his cigarettes. 3. Essential hypertension-he may need increase in his medications however he states that his blood pressure is usually normal at Dr. Hervey Ard office. He did not wish to change. 4. Hyperlipidemia-continue with Crestor. This is being monitored by Dr. Nyoka Cowden. 5. Peripheral vascular disease-Dr. Maryjean Morn, vascular surgeon has been monitoring, Dopplers. He states that it is stable. 6. Abdominal aortic aneurysm-3.9-4 cm. Stable. 7. One year follow up.  Signed, Candee Furbish, MD [facility]  10/15/2013 10:01 AM

## 2013-10-20 ENCOUNTER — Encounter: Payer: Self-pay | Admitting: Internal Medicine

## 2013-10-20 ENCOUNTER — Ambulatory Visit (AMBULATORY_SURGERY_CENTER): Payer: Medicare Other | Admitting: Internal Medicine

## 2013-10-20 VITALS — BP 124/76 | HR 62 | Temp 98.1°F | Resp 27 | Ht 72.0 in | Wt 193.0 lb

## 2013-10-20 DIAGNOSIS — D124 Benign neoplasm of descending colon: Secondary | ICD-10-CM

## 2013-10-20 DIAGNOSIS — Z1211 Encounter for screening for malignant neoplasm of colon: Secondary | ICD-10-CM | POA: Diagnosis not present

## 2013-10-20 DIAGNOSIS — I739 Peripheral vascular disease, unspecified: Secondary | ICD-10-CM | POA: Diagnosis not present

## 2013-10-20 DIAGNOSIS — D126 Benign neoplasm of colon, unspecified: Secondary | ICD-10-CM

## 2013-10-20 DIAGNOSIS — D125 Benign neoplasm of sigmoid colon: Secondary | ICD-10-CM

## 2013-10-20 DIAGNOSIS — I10 Essential (primary) hypertension: Secondary | ICD-10-CM | POA: Diagnosis not present

## 2013-10-20 DIAGNOSIS — I2510 Atherosclerotic heart disease of native coronary artery without angina pectoris: Secondary | ICD-10-CM | POA: Diagnosis not present

## 2013-10-20 DIAGNOSIS — D689 Coagulation defect, unspecified: Secondary | ICD-10-CM | POA: Diagnosis not present

## 2013-10-20 MED ORDER — SODIUM CHLORIDE 0.9 % IV SOLN: 500.0000 mL | INTRAVENOUS | Status: DC

## 2013-10-20 NOTE — Op Note (Signed)
[facility]  [address]. [place], [address]   COLONOSCOPY PROCEDURE REPORT  PATIENT: Seth [name], Seth [name]  MR#: [id number] BIRTHDATE: 1945/07/31 , 68  yrs. old GENDER: Male ENDOSCOPIST: Eustace Quail, MD REFERRED DB:ZMCEYEM Greene, M.D. PROCEDURE DATE:  10/20/2013 PROCEDURE:   Colonoscopy with snare polypectomy x 3 First Screening Colonoscopy - Avg.  risk and is 50 yrs.  old or older Yes.  Prior Negative Screening - Now for repeat screening. N/A  History of Adenoma - Now for follow-up colonoscopy & has been > or = to 3 yrs.  N/A  Polyps Removed Today? Yes. ASA CLASS:   Class II INDICATIONS:average risk screening. MEDICATIONS: MAC sedation, administered by CRNA and propofol (Diprivan) 250mg  IV  DESCRIPTION OF PROCEDURE:   After the risks benefits and alternatives of the procedure were thoroughly explained, informed consent was obtained.  A digital rectal exam revealed no abnormalities of the rectum.   The LB [id number]  endoscope was introduced through the anus and advanced to the cecum, which was identified by both the appendix and ileocecal valve. No adverse events experienced.   The quality of the prep was excellent, using MoviPrep  The instrument was then slowly withdrawn as the colon was fully examined.  COLON FINDINGS: Three polyps were found in the descending colon (22mm), sigmoid colon (32mm), and rectum (86mm).  A polypectomy was performed with a cold snare.  The resection was complete and the polyp tissue was completely retrieved. The rectal polypectomy base was lightly cauterizrd for minor oozing.   The colon mucosa was otherwise normal.  Retroflexed views revealed no abnormalities. The time to cecum=4 minutes 05 seconds.  Withdrawal time=12 minutes 22 seconds.  The scope was withdrawn and the procedure completed. COMPLICATIONS: There were no complications.  ENDOSCOPIC IMPRESSION: 1.   Three polyps were found in the colon; polypectomy was  performed with a cold snare 2.   The colon mucosa was otherwise normal  RECOMMENDATIONS: 1.  Repeat Colonoscopy in 3 years if largest polyp adenomatous or all polyps adenomas. 2.  Resume Plavix today   eSigned:  Eustace Quail, MD 10/20/2013 8:32 AM   cc: [name], MD and The Patient

## 2013-10-20 NOTE — Patient Instructions (Signed)
YOU HAD AN ENDOSCOPIC PROCEDURE TODAY AT THE [facility]: Refer to the procedure report that was given to you for any specific questions about what was found during the examination.  If the procedure report does not answer your questions, please call your gastroenterologist to clarify.  If you requested that your care partner not be given the details of your procedure findings, then the procedure report has been included in a sealed envelope for you to review at your convenience later.  YOU SHOULD EXPECT: Some feelings of bloating in the abdomen. Passage of more gas than usual.  Walking can help get rid of the air that was put into your GI tract during the procedure and reduce the bloating. If you had a lower endoscopy (such as a colonoscopy or flexible sigmoidoscopy) you may notice spotting of blood in your stool or on the toilet paper. If you underwent a bowel prep for your procedure, then you may not have a normal bowel movement for a few days.  DIET: Your first meal following the procedure should be a light meal and then it is ok to progress to your normal diet.  A half-sandwich or bowl of soup is an example of a good first meal.  Heavy or fried foods are harder to digest and may make you feel nauseous or bloated.  Likewise meals heavy in dairy and vegetables can cause extra gas to form and this can also increase the bloating.  Drink plenty of fluids but you should avoid alcoholic beverages for 24 hours.  ACTIVITY: Your care partner should take you home directly after the procedure.  You should plan to take it easy, moving slowly for the rest of the day.  You can resume normal activity the day after the procedure however you should NOT DRIVE or use heavy machinery for 24 hours (because of the sedation medicines used during the test).    SYMPTOMS TO REPORT IMMEDIATELY: A gastroenterologist can be reached at any hour.  During normal business hours, 8:30 AM to 5:00 PM Monday through Friday,  call [phone].  After hours and on weekends, please call the GI answering service at [phone] who will take a message and have the physician on call contact you.   Following lower endoscopy (colonoscopy or flexible sigmoidoscopy):  Excessive amounts of blood in the stool  Significant tenderness or worsening of abdominal pains  Swelling of the abdomen that is new, acute  Fever of 100F or higher    FOLLOW UP: If any biopsies were taken you will be contacted by phone or by letter within the next 1-3 weeks.  Call your gastroenterologist if you have not heard about the biopsies in 3 weeks.  Our staff will call the home number listed on your records the next business day following your procedure to check on you and address any questions or concerns that you may have at that time regarding the information given to you following your procedure. This is a courtesy call and so if there is no answer at the home number and we have not heard from you through the emergency physician on call, we will assume that you have returned to your regular daily activities without incident.  SIGNATURES/CONFIDENTIALITY: You and/or your care partner have signed paperwork which will be entered into your electronic medical record.  These signatures attest to the fact that that the information above on your After Visit Summary has been reviewed and is understood.  Full responsibility of the confidentiality   of this discharge information lies with you and/or your care-partner.  Polyp information given.  Resume Plavix today.  Dr. Henrene Pastor will advise you about next colonoscopy after pathology reports are reviewed.

## 2013-10-20 NOTE — Progress Notes (Signed)
Report to PACU, RN, vss, BBS= Clear.  

## 2013-10-20 NOTE — Progress Notes (Signed)
Called to room to assist during endoscopic procedure.  Patient ID and intended procedure confirmed with present staff. Received instructions for my participation in the procedure from the performing physician.  

## 2013-10-21 ENCOUNTER — Telehealth: Payer: Self-pay | Admitting: *Deleted

## 2013-10-21 NOTE — Telephone Encounter (Signed)
  Follow up Call-  Call back number 10/20/2013  Post procedure Call Back phone  # [phone]     Patient questions:  Do you have a fever, pain , or abdominal swelling? No. Pain Score  0 *  Have you tolerated food without any problems? Yes.    Have you been able to return to your normal activities? Yes.    Do you have any questions about your discharge instructions: Diet   No. Medications  No. Follow up visit  No.  Do you have questions or concerns about your Care? No.  Actions: * If pain score is 4 or above: No action needed, pain <4.

## 2013-10-27 ENCOUNTER — Encounter: Payer: Self-pay | Admitting: Internal Medicine

## 2013-10-29 ENCOUNTER — Encounter: Payer: Self-pay | Admitting: Cardiology

## 2013-12-17 ENCOUNTER — Telehealth: Payer: Self-pay | Admitting: Physician Assistant

## 2013-12-17 NOTE — Telephone Encounter (Signed)
Patient went to the pharmacy to pick up his metoprolol (LOPRESSOR) 50 MG tablet.  Pharmacy advised patient to contact us as it is expired.   Patient received an e-mail from the pharmacy a week ago, did not respond.   Completely out of medication.   [phone]

## 2013-12-23 DIAGNOSIS — L509 Urticaria, unspecified: Secondary | ICD-10-CM | POA: Diagnosis not present

## 2013-12-23 DIAGNOSIS — F17210 Nicotine dependence, cigarettes, uncomplicated: Secondary | ICD-10-CM | POA: Diagnosis not present

## 2013-12-23 DIAGNOSIS — J300 Vasomotor rhinitis: Secondary | ICD-10-CM | POA: Diagnosis not present

## 2014-01-21 ENCOUNTER — Telehealth: Payer: Self-pay

## 2014-01-21 NOTE — Telephone Encounter (Signed)
LMVM reminding patient to get his flu shot.

## 2014-03-15 ENCOUNTER — Encounter: Payer: Self-pay | Admitting: Family Medicine

## 2014-03-15 ENCOUNTER — Ambulatory Visit (INDEPENDENT_AMBULATORY_CARE_PROVIDER_SITE_OTHER): Payer: Medicare Other | Admitting: Family Medicine

## 2014-03-15 VITALS — BP 145/89 | HR 70 | Temp 98.0°F | Resp 16 | Ht 72.25 in | Wt 191.0 lb

## 2014-03-15 DIAGNOSIS — I739 Peripheral vascular disease, unspecified: Secondary | ICD-10-CM | POA: Diagnosis not present

## 2014-03-15 DIAGNOSIS — E7800 Pure hypercholesterolemia, unspecified: Secondary | ICD-10-CM

## 2014-03-15 DIAGNOSIS — I714 Abdominal aortic aneurysm, without rupture, unspecified: Secondary | ICD-10-CM

## 2014-03-15 DIAGNOSIS — I10 Essential (primary) hypertension: Secondary | ICD-10-CM

## 2014-03-15 DIAGNOSIS — Z720 Tobacco use: Secondary | ICD-10-CM

## 2014-03-15 DIAGNOSIS — J329 Chronic sinusitis, unspecified: Secondary | ICD-10-CM | POA: Diagnosis not present

## 2014-03-15 MED ORDER — METOPROLOL TARTRATE 50 MG PO TABS: ORAL_TABLET | ORAL | Status: DC

## 2014-03-15 MED ORDER — LISINOPRIL 10 MG PO TABS: ORAL_TABLET | ORAL | Status: DC

## 2014-03-15 MED ORDER — LISINOPRIL 5 MG PO TABS: 5.0000 mg | ORAL_TABLET | Freq: Every day | ORAL | Status: DC

## 2014-03-15 MED ORDER — ROSUVASTATIN CALCIUM 10 MG PO TABS: ORAL_TABLET | ORAL | Status: DC

## 2014-03-15 NOTE — Patient Instructions (Addendum)
Call ENT if more frequent saline spray not helping your sinus symptoms. Continue same doses of blood pressure medicines, but if running over 140/90 persistently we need to increase medicine.  Continue Crestor once per day. I again recommend quitting smoking as we have discussed as it affects multiple aspects of your health. Let me know if I can help with this.  Call Dr. Maryjean Morn about repeat ultrasound.  You should receive a call or letter about your lab results within the next week to 10 days - return in next few days for fasting labs.  Plan on follow up in next 6 months.

## 2014-03-15 NOTE — Progress Notes (Signed)
Subjective:  This chart was scribed for [name], MD by Donato Schultz, Medical Scribe. This patient was seen in Room 24 and the patient's care was started at 1:27 PM.   Patient ID: Seth [name], male    DOB: 03-08-1945, 69 y.o.   MRN: [id number]  HPI  HPI Comments: Seth [name] is a 69 y.o. male who presents to the [facility] for follow-up.   1. Hypertension - Saw cardiology in September 2015.  History of AAA 3.9cm.  Non-obstructive CAD and PVD.  He is also followed by Dr. Maryjean Morn vascular surgeon.  Had a cardiac cath March 2014 with EF 65%.  He has a follow-up u/s in the next month.  History of white coat hypertension.  Blood pressure 160/98 at cardiology visit.  No change in meds.  He is taking Metoprolol 75mg  BID and Lisinopril 15mg  QD.  He is also taking Plavix 75mg  QD for above problems.  He checks his blood pressure at home once a month.  His highest top number was 150 and his pressure usually runs between 135-145/79-90.  He denies chest pain, abdominal pain, SOB, chest tightness, headaches, lightheadedness, and dizziness as associated symptoms.    2. Hyperlipidemia - He takes Crestor 10mg  QD.  Last lipid panel August 2015 overall controlled.  He is concerned about using Zantac with Crestor.  He is not fasting today. Lab Results  Component Value Date   CHOL 156 09/07/2013   HDL 37* 09/07/2013   LDLCALC 94 09/07/2013   TRIG 124 09/07/2013   CHOLHDL 4.2 09/07/2013   3. Chronic sinusitis with allergic rhinitis - unable to tolerate steroid nasal sprays.  Tried Atrovent nasal spray in the past that made him feel "weird" but was able to use twice per day with some improvement of symptoms.  He has seen an allergist and takes Zyrtec and Zantac.  Was referred to ENT.  He states that his sinusitis has worsened over the last 6 months.  His symptoms are worsened when he eats or drinks something hot.  HHe saw an ENT a few months ago and was recommended to use saline nasal spray when  his symptoms are not controlled with the Zyrtec and Zantac.  He has a follow-up appointment with his ENT in the next 6 months.    4. Health maintenance - Colonoscopy October 20, 2013.  3 polyps noted.  Plan on repeat in 3 years.  Will ask about pneumonia and flu vaccine today.  Pneumonia vaccination was dicussed on August 3 and was declined at that time.  He does not want to receive the vaccine today.  He does not take flu vaccines due to a previous hospitalization with the flu.  He does not want to receive the flu vaccine today.  5. Tobacco Use - Discussed last office visit.  Recommended cessation and resources given.  He had discussed E-Cigarettes in the past but as I recommended, I could not determine if these were safe and cessation would be ideal treatment.  He smokes 10-14 cigarettes daily.  He still has the information he was provided at his last visit but has not seriously considered quitting smoking.     Patient Active Problem List   Diagnosis Date Noted  . PVD (peripheral vascular disease) 10/15/2013  . Tobacco use disorder 10/15/2013  . Pure hypercholesterolemia 10/15/2013  . AAA (abdominal aortic aneurysm) 10/15/2013  . Coronary artery disease involving native coronary artery of native heart without angina pectoris 10/15/2013  . Cataract  09/29/2013  . Colon cancer screening 09/25/2013  . AAA (abdominal aortic aneurysm) without rupture 10/22/2012  . Nicotine addiction 10/17/2012  . Atherosclerosis of native arteries of the extremities with ulceration(440.23) 10/17/2012  . Carotid bruit 10/17/2012  . Other nonspecific abnormal cardiovascular system function study 04/10/2012  . History of sciatica   . Essential hypertension, benign   . Seasonal allergies   . HTN (hypertension) 04/04/2011  . Abdominal aortic aneurysm 11/03/2010   Past Medical History  Diagnosis Date  . History of sciatica   . Essential hypertension, benign   . Seasonal allergies   . Back pain   .  Hyperlipidemia   . Gout   . CAD (coronary artery disease)   . AAA (abdominal aortic aneurysm)   . PVD (peripheral vascular disease)   . Anxiety   . Allergic rhinitis    Past Surgical History  Procedure Laterality Date  . Cardiac catheterization      stent placement   Allergies  Allergen Reactions  . Biaxin [Clarithromycin] Shortness Of Breath  . Clarithromycin Shortness Of Breath  . Iodinated Diagnostic Agents Hives  . Cortisone Other (See Comments)    Turns red from breast up  . Iodine Hives    Dye from nuclear medicine test  . Prednisone    Prior to Admission medications   Medication Sig Start Date End Date Taking? Authorizing Provider  aspirin EC 81 MG tablet Take 81 mg by mouth daily.   Yes Historical Provider, MD  cetirizine (ZYRTEC) 10 MG tablet Take 10 mg by mouth daily.   Yes Historical Provider, MD  clopidogrel (PLAVIX) 75 MG tablet Take 75 mg by mouth daily.   Yes Historical Provider, MD  lisinopril (PRINIVIL,ZESTRIL) 10 MG tablet TAKE 1 TABLET BY MOUTH DAILY. 09/07/13  Yes [name], MD  lisinopril (PRINIVIL,ZESTRIL) 5 MG tablet Take 1 tablet (5 mg total) by mouth daily. 09/07/13  Yes [name], MD  metoprolol (LOPRESSOR) 50 MG tablet TAKE 1 AND 1/2 TABLET BY MOUTH TWICE A DAY 12/18/13  Yes Chelle S Jeffery, PA-C  ranitidine (ZANTAC) 75 MG tablet Take 75 mg by mouth 2 (two) times daily.   Yes Historical Provider, MD  rosuvastatin (CRESTOR) 10 MG tablet TAKE 1 TABLET (10 MG TOTAL) BY MOUTH DAILY. 09/07/13  Yes [name], MD  SALINE NA Place into the nose as needed.   Yes Historical Provider, MD   History   Social History  . Marital Status: Married    Spouse Name: N/A    Number of Children: 1  . Years of Education: N/A   Occupational History  . retired    Social History Main Topics  . Smoking status: Current Every Day Smoker -- 0.50 packs/day for 50 years    Types: Cigarettes  . Smokeless tobacco: Never Used  . Alcohol Use: Yes     Comment:  sometimes mixed drinks or beers  . Drug Use: No  . Sexual Activity: Not on file   Other Topics Concern  . Not on file   Social History Narrative    Review of Systems  Constitutional: Negative for fatigue and unexpected weight change.  Eyes: Negative for visual disturbance.  Respiratory: Negative for cough, chest tightness and shortness of breath.   Cardiovascular: Negative for chest pain, palpitations and leg swelling.  Gastrointestinal: Negative for abdominal pain and blood in stool.  Neurological: Negative for dizziness, light-headedness and headaches.     Objective:  Physical Exam  Constitutional: He is oriented  to person, place, and time. He appears well-developed and well-nourished.  HENT:  Head: Normocephalic and atraumatic.  Right Ear: External ear normal.  Left Ear: External ear normal.  Nose: Nose normal. Right sinus exhibits no maxillary sinus tenderness and no frontal sinus tenderness. Left sinus exhibits no maxillary sinus tenderness and no frontal sinus tenderness.  Mouth/Throat: Oropharynx is clear and moist. No oropharyngeal exudate.  Edematous turbinates without active drainage or discharge.  Eyes: EOM are normal. Pupils are equal, round, and reactive to light.  Neck: No JVD present. Carotid bruit is not present.  Cardiovascular: Normal rate, regular rhythm and normal heart sounds.  Exam reveals no gallop and no friction rub.   No murmur heard. Pulmonary/Chest: Effort normal and breath sounds normal. No respiratory distress. He has no wheezes. He has no rales.  Abdominal: Soft. Bowel sounds are normal. He exhibits no distension. There is no tenderness. There is no rebound and no guarding.  Palpable abdominal pulse without apparent lateral deviation.    Musculoskeletal: He exhibits no edema.  Neurological: He is alert and oriented to person, place, and time.  Skin: Skin is warm and dry.  Psychiatric: He has a normal mood and affect.  Vitals  reviewed.    Filed Vitals:   03/15/14 1256  BP: 145/89  Pulse: 70  Temp: 98 F (36.7 C)  TempSrc: Oral  Resp: 16  Height: 6' 0.25" (1.835 m)  Weight: 191 lb (86.637 kg)  SpO2: 97%   Assessment & Plan:   Seth [name] is a 69 y.o. male Essential hypertension - Plan: lisinopril (PRINIVIL,ZESTRIL) 10 MG tablet, lisinopril (PRINIVIL,ZESTRIL) 5 MG tablet, metoprolol (LOPRESSOR) 50 MG tablet, COMPLETE METABOLIC PANEL WITH GFR, Lipid panel  - borderline here, but white coat hypertension in past.   - continue same doses of meds, but if home readings remain elevated, can increase lisinopril to 20mg  qd.  Pure hypercholesterolemia - Plan: rosuvastatin (CRESTOR) 10 MG tablet, COMPLETE METABOLIC PANEL WITH GFR, Lipid panel  -tolerating Crestor, prior LDL controlled, low HDL.  Lipid panel, CMP pending.   Peripheral vascular disease, AAA (abdominal aortic aneurysm) without rupture  - Plan: rosuvastatin (CRESTOR) 10 MG tablet  - discussed smoking cessation again. Continue plavix, crestor, asa 81mg  qd, and follow up with specialist for AAA monitoring.   Tobacco abuse  - cessation again recommended, and risks discussed with smoking and AAA, and PVD.   Chronic sinusitis, unspecified location  -increase saline NS use, and follow up with ENT to discuss further options as chronic nature.   recheck in 6 months. Ok to refill other meds if needed for that time.    Meds ordered this encounter  Medications  . lisinopril (PRINIVIL,ZESTRIL) 10 MG tablet    Sig: TAKE 1 TABLET BY MOUTH DAILY.    Dispense:  90 tablet    Refill:  1  . lisinopril (PRINIVIL,ZESTRIL) 5 MG tablet    Sig: Take 1 tablet (5 mg total) by mouth daily.    Dispense:  90 tablet    Refill:  1  . metoprolol (LOPRESSOR) 50 MG tablet    Sig: TAKE 1 AND 1/2 TABLET BY MOUTH TWICE A DAY    Dispense:  270 tablet    Refill:  1  . rosuvastatin (CRESTOR) 10 MG tablet    Sig: TAKE 1 TABLET (10 MG TOTAL) BY MOUTH DAILY.    Dispense:  90  tablet    Refill:  1   Patient Instructions  Call ENT if more frequent saline spray  not helping your sinus symptoms. Continue same doses of blood pressure medicines, but if running over 140/90 persistently we need to increase medicine.  Continue Crestor once per day. I again recommend quitting smoking as we have discussed as it affects multiple aspects of your health. Let me know if I can help with this.  Call Dr. Maryjean Morn about repeat ultrasound.  You should receive a call or letter about your lab results within the next week to 10 days - return in next few days for fasting labs.  Plan on follow up in next 6 months.     I personally performed the services described in this documentation, which was scribed in my presence. The recorded information has been reviewed and considered, and addended by me as needed.

## 2014-03-16 DIAGNOSIS — H2513 Age-related nuclear cataract, bilateral: Secondary | ICD-10-CM | POA: Diagnosis not present

## 2014-04-14 DIAGNOSIS — I714 Abdominal aortic aneurysm, without rupture: Secondary | ICD-10-CM | POA: Diagnosis not present

## 2014-04-14 DIAGNOSIS — I723 Aneurysm of iliac artery: Secondary | ICD-10-CM | POA: Diagnosis not present

## 2014-04-14 DIAGNOSIS — I70203 Unspecified atherosclerosis of native arteries of extremities, bilateral legs: Secondary | ICD-10-CM | POA: Diagnosis not present

## 2014-04-14 DIAGNOSIS — Z95820 Peripheral vascular angioplasty status with implants and grafts: Secondary | ICD-10-CM | POA: Diagnosis not present

## 2014-04-14 DIAGNOSIS — I6523 Occlusion and stenosis of bilateral carotid arteries: Secondary | ICD-10-CM | POA: Diagnosis not present

## 2014-05-06 ENCOUNTER — Other Ambulatory Visit: Payer: Self-pay | Admitting: Family Medicine

## 2014-09-04 ENCOUNTER — Other Ambulatory Visit: Payer: Self-pay | Admitting: Family Medicine

## 2014-09-13 ENCOUNTER — Encounter: Payer: Self-pay | Admitting: Family Medicine

## 2014-09-13 ENCOUNTER — Ambulatory Visit (INDEPENDENT_AMBULATORY_CARE_PROVIDER_SITE_OTHER): Payer: Medicare Other | Admitting: Family Medicine

## 2014-09-13 VITALS — BP 150/84 | HR 72 | Temp 97.0°F | Resp 16 | Ht 72.5 in | Wt 199.2 lb

## 2014-09-13 DIAGNOSIS — J329 Chronic sinusitis, unspecified: Secondary | ICD-10-CM

## 2014-09-13 DIAGNOSIS — E785 Hyperlipidemia, unspecified: Secondary | ICD-10-CM | POA: Diagnosis not present

## 2014-09-13 DIAGNOSIS — I714 Abdominal aortic aneurysm, without rupture, unspecified: Secondary | ICD-10-CM

## 2014-09-13 DIAGNOSIS — I739 Peripheral vascular disease, unspecified: Secondary | ICD-10-CM | POA: Diagnosis not present

## 2014-09-13 DIAGNOSIS — Z720 Tobacco use: Secondary | ICD-10-CM | POA: Diagnosis not present

## 2014-09-13 DIAGNOSIS — I10 Essential (primary) hypertension: Secondary | ICD-10-CM | POA: Diagnosis not present

## 2014-09-13 MED ORDER — METOPROLOL TARTRATE 50 MG PO TABS: 75.0000 mg | ORAL_TABLET | Freq: Two times a day (BID) | ORAL | Status: DC

## 2014-09-13 NOTE — Progress Notes (Signed)
Subjective:  This chart was scribed for Seth Ray, MD by Thea Alken, ED Scribe. This patient was seen in room 24 and the patient's care was started at 1:09 PM.  Patient ID: Seth [name], male    DOB: February 24, 1945, 69 y.o.   MRN: [id number]  HPI  HPI Comments: Seth [name] is a 69 y.o. male who presents to the [facility] for a follow up.   Hypertension Hx of slight elevation with white coat component of HTN. Home readings have been better than at office at times. He takes lisinopril 15 mg total per day and metoprolol and 75 mg total twice per day. Since last visit he states he's check BP about 4 times ranging from 135-150. He states he has been tolerating medication.   Lab Results  Component Value Date   CREATININE 1.06 09/07/2013   Hyperlipidemia Futures labs ordered last visit have not been done, controlled prior. He is on crestor 10 mg qd.   PVD Hx of 3.9 cm AAA and non obstructive CAD. Cardiac cath March 2014. Followed by vascular surgeon Dr. Maryjean Morn. Pt saw Dr. Maryjean Morn in April and had re-scanned AAA but no new changed were made.   Tobacco abuse Smokes electronic cigarettes. Cessation resource discussed last visit. Pt quit smoking about 2 days ago and has been using nicotine gum. He has found since quitting he's had improvement with sinusitis, congestion and drainage. He states while taking metoprolol he does not feel as anxious about quitting.  Health maintenance Declined pneumonia and flu vaccine last visit. Last PSA normal 09/2013. Due fore CPE. Pt want to wait to have prostate screening at next visit. He does not want flu or pneumonia vaccine.   Pt is not fasting and ate breakfast at noon.   Patient Active Problem List   Diagnosis Date Noted  . PVD (peripheral vascular disease) 10/15/2013  . Tobacco use disorder 10/15/2013  . Pure hypercholesterolemia 10/15/2013  . AAA (abdominal aortic aneurysm) 10/15/2013  . Coronary artery disease involving native  coronary artery of native heart without angina pectoris 10/15/2013  . Cataract 09/29/2013  . Colon cancer screening 09/25/2013  . AAA (abdominal aortic aneurysm) without rupture 10/22/2012  . Nicotine addiction 10/17/2012  . Atherosclerosis of native arteries of the extremities with ulceration(440.23) 10/17/2012  . Carotid bruit 10/17/2012  . Other nonspecific abnormal cardiovascular system function study 04/10/2012  . History of sciatica   . Essential hypertension, benign   . Seasonal allergies   . HTN (hypertension) 04/04/2011  . Abdominal aortic aneurysm 11/03/2010   Past Medical History  Diagnosis Date  . History of sciatica   . Essential hypertension, benign   . Seasonal allergies   . Back pain   . Hyperlipidemia   . Gout   . CAD (coronary artery disease)   . AAA (abdominal aortic aneurysm)   . PVD (peripheral vascular disease)   . Anxiety   . Allergic rhinitis    Past Surgical History  Procedure Laterality Date  . Cardiac catheterization      stent placement   Allergies  Allergen Reactions  . Biaxin [Clarithromycin] Shortness Of Breath  . Clarithromycin Shortness Of Breath  . Iodinated Diagnostic Agents Hives  . Cortisone Other (See Comments)    Turns red from breast up  . Iodine Hives    Dye from nuclear medicine test  . Prednisone    Prior to Admission medications   Medication Sig Start Date End Date Taking? Authorizing Provider  aspirin EC  81 MG tablet Take 81 mg by mouth daily.   Yes Historical Provider, MD  cetirizine (ZYRTEC) 10 MG tablet Take 10 mg by mouth daily.   Yes Historical Provider, MD  lisinopril (PRINIVIL,ZESTRIL) 10 MG tablet TAKE 1 TABLET BY MOUTH DAILY. 03/15/14  Yes [name], MD  lisinopril (PRINIVIL,ZESTRIL) 5 MG tablet Take 1 tablet (5 mg total) by mouth daily. 03/15/14  Yes [name], MD  metoprolol (LOPRESSOR) 50 MG tablet Take 1.5 tablets (75 mg total) by mouth 2 (two) times daily. PATIENT NEEDS OFFICE VISIT FOR ADDITIONAL  REFILLS 09/04/14  Yes Bennett Scrape V, PA-C  ranitidine (ZANTAC) 75 MG tablet Take 75 mg by mouth 2 (two) times daily.   Yes Historical Provider, MD  rosuvastatin (CRESTOR) 10 MG tablet TAKE 1 TABLET (10 MG TOTAL) BY MOUTH DAILY. 03/15/14  Yes [name], MD  SALINE NA Place into the nose as needed.   Yes Historical Provider, MD  clopidogrel (PLAVIX) 75 MG tablet Take 75 mg by mouth daily.    Historical Provider, MD   History   Social History  . Marital Status: Married    Spouse Name: N/A  . Number of Children: 1  . Years of Education: N/A   Occupational History  . retired    Social History Main Topics  . Smoking status: Current Every Day Smoker -- 0.50 packs/day for 50 years    Types: Cigarettes  . Smokeless tobacco: Never Used  . Alcohol Use: Yes     Comment: sometimes mixed drinks or beers  . Drug Use: No  . Sexual Activity: Not on file   Other Topics Concern  . Not on file   Social History Narrative   Review of Systems  Constitutional: Negative for fatigue and unexpected weight change.  Eyes: Negative for visual disturbance.  Respiratory: Negative for cough, chest tightness and shortness of breath.   Cardiovascular: Negative for chest pain, palpitations and leg swelling.  Gastrointestinal: Negative for abdominal pain and blood in stool.  Neurological: Negative for dizziness, light-headedness and headaches.   Objective:   Physical Exam  Constitutional: He is oriented to person, place, and time. He appears well-developed and well-nourished.  HENT:  Head: Normocephalic and atraumatic.  Eyes: EOM are normal. Pupils are equal, round, and reactive to light.  Neck: No JVD present. Carotid bruit is not present.  Cardiovascular: Normal rate, regular rhythm and normal heart sounds.   No murmur heard. Pulmonary/Chest: Effort normal and breath sounds normal. He has no rales.  Musculoskeletal: He exhibits no edema.  Neurological: He is alert and oriented to person, place,  and time.  Skin: Skin is warm and dry.  Psychiatric: He has a normal mood and affect.  Vitals reviewed.  Filed Vitals:   09/13/14 1305  BP: 150/84  Pulse: 72  Temp: 97 F (36.1 C)  TempSrc: Oral  Resp: 16  Height: 6' 0.5" (1.842 m)  Weight: 199 lb 3.2 oz (90.357 kg)     Assessment & Plan:   Seth [name] is a 69 y.o. male Essential hypertension - Plan: metoprolol (LOPRESSOR) 50 MG tablet   - borderline here and in other offices. May improve off smoking. Check home BP's next few weeks and to call if remains over 140/90 at home. Continue same dose of lisinopril total dose '15mg'$  BID and metoprolol '75mg'$  BID for now.   PVD (peripheral vascular disease)  -stable by recent report, followed by vascular surgeon, no new symptoms and tolerating meds overall.  AAA (abdominal aortic aneurysm) without rupture  - stable by recent report. Continue routine follow up with vascular surgeon, and commended on current tobacco use cessation.   Tobacco abuse  - on nicotine gum.  Commended on efforts and if difficulty with continuing cessation - arrange visit with [facility] for resources.   Chronic sinusitis, unspecified location  -improved with quitting smoking. Continue saline spray, then if recurrence of sx's - call his ENT for appt. RTC sooner if acute worsening.   Hyperlipidemia  -lipids ordered. Not fasting today.  future labs ordered. Continue crestor.   Meds ordered this encounter  Medications  . DISCONTD: metoprolol (LOPRESSOR) 50 MG tablet    Sig: Take 1.5 tablets (75 mg total) by mouth 2 (two) times daily. PATIENT NEEDS OFFICE VISIT FOR ADDITIONAL REFILLS    Dispense:  90 tablet    Refill:  0  . OVER THE COUNTER MEDICATION    Sig:   . metoprolol (LOPRESSOR) 50 MG tablet    Sig: Take 1.5 tablets (75 mg total) by mouth 2 (two) times daily.    Dispense:  270 tablet    Refill:  1   Patient Instructions  Keep a record of your blood pressures outside of the office and  if remaining over 140/90 in next 2-3 weeks - call me and we can discuss how to change meds.   Return in next few days for fasting labs only in next few days. You should receive a call or letter about your lab results within the next week to 10 days once labs drawn.  Keep up the good work on quitting smoking - HANG IN THERE! If you are having difficulty with quitting smoking - [facility] offers smoking cessation clinics. Registration is required. To register call [phone] or register online at [email or web address]  Ok to continue saline nasal spray for sinuses. Follow up with ENT if symptoms persist.  Return to the clinic or go to the nearest emergency room if any of your symptoms worsen or new symptoms occur.   Follow up in next 6 months for physical as planned.     I personally performed the services described in this documentation, which was scribed in my presence. The recorded information has been reviewed and considered, and addended by me as needed.

## 2014-09-13 NOTE — Patient Instructions (Signed)
Keep a record of your blood pressures outside of the office and if remaining over 140/90 in next 2-3 weeks - call me and we can discuss how to change meds.   Return in next few days for fasting labs only in next few days. You should receive a call or letter about your lab results within the next week to 10 days once labs drawn.  Keep up the good work on quitting smoking - HANG IN THERE! If you are having difficulty with quitting smoking - [place] offers smoking cessation clinics. Registration is required. To register call [phone] or register online at [email or web address]  Ok to continue saline nasal spray for sinuses. Follow up with ENT if symptoms persist.  Return to the clinic or go to the nearest emergency room if any of your symptoms worsen or new symptoms occur.   Follow up in next 6 months for physical as planned.

## 2014-10-21 DIAGNOSIS — I70203 Unspecified atherosclerosis of native arteries of extremities, bilateral legs: Secondary | ICD-10-CM | POA: Diagnosis not present

## 2014-10-21 DIAGNOSIS — I714 Abdominal aortic aneurysm, without rupture: Secondary | ICD-10-CM | POA: Diagnosis not present

## 2014-10-21 DIAGNOSIS — I6523 Occlusion and stenosis of bilateral carotid arteries: Secondary | ICD-10-CM | POA: Diagnosis not present

## 2014-10-26 DIAGNOSIS — I714 Abdominal aortic aneurysm, without rupture: Secondary | ICD-10-CM | POA: Diagnosis not present

## 2014-10-26 DIAGNOSIS — I779 Disorder of arteries and arterioles, unspecified: Secondary | ICD-10-CM | POA: Diagnosis not present

## 2014-10-26 DIAGNOSIS — I70203 Unspecified atherosclerosis of native arteries of extremities, bilateral legs: Secondary | ICD-10-CM | POA: Diagnosis not present

## 2014-10-26 DIAGNOSIS — Z720 Tobacco use: Secondary | ICD-10-CM | POA: Diagnosis not present

## 2014-11-02 ENCOUNTER — Other Ambulatory Visit: Payer: Self-pay | Admitting: Family Medicine

## 2014-12-03 ENCOUNTER — Other Ambulatory Visit: Payer: Self-pay | Admitting: Physician Assistant

## 2014-12-07 ENCOUNTER — Other Ambulatory Visit: Payer: Self-pay | Admitting: Physician Assistant

## 2014-12-15 DIAGNOSIS — F17210 Nicotine dependence, cigarettes, uncomplicated: Secondary | ICD-10-CM | POA: Diagnosis not present

## 2014-12-15 DIAGNOSIS — J300 Vasomotor rhinitis: Secondary | ICD-10-CM | POA: Diagnosis not present

## 2014-12-15 DIAGNOSIS — L509 Urticaria, unspecified: Secondary | ICD-10-CM | POA: Diagnosis not present

## 2015-01-18 ENCOUNTER — Other Ambulatory Visit: Payer: Self-pay | Admitting: Physician Assistant

## 2015-03-02 ENCOUNTER — Other Ambulatory Visit: Payer: Self-pay | Admitting: Family Medicine

## 2015-03-04 NOTE — Telephone Encounter (Signed)
Last refill

## 2015-03-18 DIAGNOSIS — H2513 Age-related nuclear cataract, bilateral: Secondary | ICD-10-CM | POA: Diagnosis not present

## 2015-03-21 ENCOUNTER — Encounter: Payer: Medicare Other | Admitting: Family Medicine

## 2015-03-23 ENCOUNTER — Encounter: Payer: Self-pay | Admitting: Family Medicine

## 2015-03-23 ENCOUNTER — Ambulatory Visit (INDEPENDENT_AMBULATORY_CARE_PROVIDER_SITE_OTHER): Payer: Medicare Other | Admitting: Family Medicine

## 2015-03-23 VITALS — BP 120/82 | HR 70 | Temp 97.4°F | Resp 16 | Ht 72.75 in | Wt 197.4 lb

## 2015-03-23 DIAGNOSIS — E785 Hyperlipidemia, unspecified: Secondary | ICD-10-CM

## 2015-03-23 DIAGNOSIS — I10 Essential (primary) hypertension: Secondary | ICD-10-CM | POA: Diagnosis not present

## 2015-03-23 DIAGNOSIS — Z0000 Encounter for general adult medical examination without abnormal findings: Secondary | ICD-10-CM

## 2015-03-23 DIAGNOSIS — Z125 Encounter for screening for malignant neoplasm of prostate: Secondary | ICD-10-CM | POA: Diagnosis not present

## 2015-03-23 DIAGNOSIS — R6889 Other general symptoms and signs: Secondary | ICD-10-CM

## 2015-03-23 LAB — COMPLETE METABOLIC PANEL WITH GFR
ALBUMIN: 4.3 g/dL (ref 3.6–5.1)
ALK PHOS: 85 U/L (ref 40–115)
ALT: 10 U/L (ref 9–46)
AST: 15 U/L (ref 10–35)
BUN: 11 mg/dL (ref 7–25)
CALCIUM: 9.2 mg/dL (ref 8.6–10.3)
CHLORIDE: 103 mmol/L (ref 98–110)
CO2: 21 mmol/L (ref 20–31)
CREATININE: 1.09 mg/dL (ref 0.70–1.25)
GFR, EST AFRICAN AMERICAN: 80 mL/min (ref >=60)
GFR, Est Non African American: 69 mL/min (ref >=60)
Glucose, Bld: 104 mg/dL — ABNORMAL HIGH (ref 65–99)
Potassium: 4.5 mmol/L (ref 3.5–5.3)
Sodium: 137 mmol/L (ref 135–146)
Total Bilirubin: 0.6 mg/dL (ref 0.2–1.2)
Total Protein: 7.1 g/dL (ref 6.1–8.1)

## 2015-03-23 LAB — TSH: TSH: 1.65 m[IU]/L (ref 0.40–4.50)

## 2015-03-23 LAB — LIPID PANEL
CHOL/HDL RATIO: 3.9 ratio (ref <=5.0)
CHOLESTEROL: 136 mg/dL (ref 125–200)
HDL: 35 mg/dL — ABNORMAL LOW (ref >=40)
LDL Cholesterol: 81 mg/dL (ref <130)
TRIGLYCERIDES: 102 mg/dL (ref <150)
VLDL: 20 mg/dL (ref <30)

## 2015-03-23 MED ORDER — LISINOPRIL 5 MG PO TABS: 5.0000 mg | ORAL_TABLET | Freq: Every day | ORAL | Status: DC

## 2015-03-23 MED ORDER — LISINOPRIL 10 MG PO TABS: ORAL_TABLET | ORAL | Status: DC

## 2015-03-23 MED ORDER — ROSUVASTATIN CALCIUM 10 MG PO TABS: 10.0000 mg | ORAL_TABLET | Freq: Every day | ORAL | Status: DC

## 2015-03-23 MED ORDER — METOPROLOL TARTRATE 50 MG PO TABS: 75.0000 mg | ORAL_TABLET | Freq: Two times a day (BID) | ORAL | Status: DC

## 2015-03-23 NOTE — Patient Instructions (Signed)
You should receive a call or letter about your lab results within the next week to 10 days.  If trouble initiating urine increases or other new urinary symptoms - return to discuss further.  Recommend audiologist or hearing test if continued difficulty with hearing to make sure you do not require hearing aids.  See info on advanced directives and bring a copy for [place] to place on file.  Keep follow up with other specialists including ENT for your sinuses. If they do not think your cough is due to allergies, can look into possibly changing lisinopril.  Return to the clinic or go to the nearest emergency room if any of your symptoms worsen or new symptoms occur.  Keeping you healthy  Get these tests  Blood pressure- Have your blood pressure checked once a year by your healthcare provider.  Normal blood pressure is 120/80  Weight- Have your body mass index (BMI) calculated to screen for obesity.  BMI is a measure of body fat based on height and weight. You can also calculate your own BMI at [email or web address].  Cholesterol- Have your cholesterol checked every year.  Diabetes- Have your blood sugar checked regularly if you have high blood pressure, high cholesterol, have a family history of diabetes or if you are overweight.  Screening for Colon Cancer- Colonoscopy starting at age 41.  Screening may begin sooner depending on your family history and other health conditions. Follow up colonoscopy as directed by your Gastroenterologist.  Screening for Prostate Cancer- Both blood work (PSA) and a rectal exam help screen for Prostate Cancer.  Screening begins at age 15 with African-American men and at age 73 with Caucasian men.  Screening may begin sooner depending on your family history.  Take these medicines  Aspirin- One aspirin daily can help prevent Heart disease and Stroke.  Flu shot- Every fall.  Tetanus- Every 10 years.  Zostavax- Once after the age of 47 to prevent  Shingles.  Pneumonia shot- Once after the age of 43; if you are younger than 66, ask your healthcare provider if you need a Pneumonia shot.  Take these steps  Don't smoke- If you do smoke, talk to your doctor about quitting.  For tips on how to quit, go to www.smokefree.gov or call 1-800-QUIT-NOW.  Be physically active- Exercise 5 days a week for at least 30 minutes.  If you are not already physically active start slow and gradually work up to 30 minutes of moderate physical activity.  Examples of moderate activity include walking briskly, mowing the yard, dancing, swimming, bicycling, etc.  Eat a healthy diet- Eat a variety of healthy food such as fruits, vegetables, low fat milk, low fat cheese, yogurt, lean meant, poultry, fish, beans, tofu, etc. For more information go to www.thenutritionsource.org  Drink alcohol in moderation- Limit alcohol intake to less than two drinks a day. Never drink and drive.  Dentist- Brush and floss twice daily; visit your dentist twice a year.  Depression- Your emotional health is as important as your physical health. If you're feeling down, or losing interest in things you would normally enjoy please talk to your healthcare provider.  Eye exam- Visit your eye doctor every year.  Safe sex- If you may be exposed to a sexually transmitted infection, use a condom.  Seat belts- Seat belts can save your life; always wear one.  Smoke/Carbon Monoxide detectors- These detectors need to be installed on the appropriate level of your home.  Replace batteries at least once a year.  Skin cancer- When out in the sun, cover up and use sunscreen 15 SPF or higher.  Violence- If anyone is threatening you, please tell your healthcare provider.  Living Will/ Health care power of attorney- Speak with your healthcare provider and family.

## 2015-03-23 NOTE — Progress Notes (Signed)
   Subjective:    Patient ID: Seth [name], male    DOB: 07/22/1945, 70 y.o.   MRN: [id number]  HPI    Review of Systems  Constitutional: Negative.   HENT: Positive for rhinorrhea and sneezing.   Eyes: Negative.   Respiratory: Negative.   Cardiovascular: Negative.   Gastrointestinal: Negative.   Endocrine: Positive for cold intolerance.  Genitourinary: Positive for difficulty urinating.  Musculoskeletal: Positive for arthralgias.  Skin: Negative.   Allergic/Immunologic: Negative.   Neurological: Negative.   Hematological: Negative.   Psychiatric/Behavioral: Negative.        Objective:   Physical Exam        Assessment & Plan:

## 2015-03-23 NOTE — Progress Notes (Signed)
Subjective:    Patient ID: Seth [name], male    DOB: 01/23/1946, 70 y.o.   MRN: [id number] By signing my name below, I, Zola Button, attest that this documentation has been prepared under the direction and in the presence of [name], MD.  Electronically Signed: Zola Button, Medical Scribe. 03/23/2015. 9:26 AM.  HPI HPI Comments: Seth [name] is a 70 y.o. male with a history of hypertension, PVD, AAA without rupture, chronic sinusitis, hyperlipidemia, and tobacco abuse who presents to the [facility] for an annual wellness exam. Patient is fasting today.  Cancer screening:  Colon cancer screening - Colonoscopy September 2015. Recommended repeat in 3 years due to polyps removed. Prostate cancer screening - Decreased from a 2.93 from March 2013. He has had difficulty starting the urine stream about 3-4 times a month for the past 10-12 years. This has not changed. Patient denies fever and dysuria. Lab Results  Component Value Date   PSA 2.36 09/07/2013   PSA 2.93 04/27/2011    Immunizations: He had refused pneumonia vaccine previously as well as flu vaccine and shingles vaccine. Immunization History  Administered Date(s) Administered  . Hepatitis B 02/06/2000  . Td 02/05/2005    Depression screening:  Depression screen [facility] 2/9 09/13/2014 09/07/2013  Decreased Interest 0 0  Down, Depressed, Hopeless 0 0  PHQ - 2 Score 0 0   Functional status screening: Positive for difficulty hearing (intermittent), otherwise no positive responses. Patient notes that he has had difficulty hearing certain frequencies in both ears for a long time. This has not changed significantly.  Vision screening: He was referred to a local eye care provider at his August 2015 physical.  Visual Acuity Screening   Right eye Left eye Both eyes  Without correction:     With correction: '20/13 20/20 20/15 '$    Dentist: He is edentulous. Wears false teeth.  Advanced directives: Discussed in 2015.  Full code but would not want prolonged life support without reasonable chance for recovery.  Peripheral vascular disease and AAA: He is on Plavix and aspirin. Followed by Dr. Maryjean Morn. S/p pelvic artery stenting. He had a cardiac cath March 2015 without obstructive CAD. AAA monitored approximately every 6 months with overall stable AAA. He has his next appointment with Dr. Maryjean Morn in about a month.  Tobacco abuse: At last visit, was smoking electronic cigarettes. Has tried quitting at previous visits with nicotine gum. Patient is still trying to quit, but he has still been smoking intermittently. He uses the nicotine gum when not smoking.  Hypertension: History of white coat hypertension. Improved readings at home. Suspected this would improve off of tobacco. Continued metoprolol 75 mg BID and lisinopril 15 mg qd. He does have a cough which he thinks may be due to the lisinopril. Patient denies chest pain and SOB. Lab Results  Component Value Date   CREATININE 1.06 09/07/2013    Hyperlipidemia: He has a future order for a lipid panel that has not been done. He takes Crestor 10 mg qd. Lab Results  Component Value Date   CHOL 156 09/07/2013   HDL 37* 09/07/2013   LDLCALC 94 09/07/2013   TRIG 124 09/07/2013   CHOLHDL 4.2 09/07/2013    Chronic sinusitis: Patient will be following up with ENT in the next 1-2 months.  Patient Active Problem List   Diagnosis Date Noted  . PVD (peripheral vascular disease) ([place]) 10/15/2013  . Tobacco use disorder 10/15/2013  . Pure hypercholesterolemia 10/15/2013  .  AAA (abdominal aortic aneurysm) ([place]) 10/15/2013  . Coronary artery disease involving native coronary artery of native heart without angina pectoris 10/15/2013  . Cataract 09/29/2013  . Colon cancer screening 09/25/2013  . AAA (abdominal aortic aneurysm) without rupture ([place]) 10/22/2012  . Nicotine addiction 10/17/2012  . Atherosclerosis of native arteries of the extremities with ulceration(440.23)  10/17/2012  . Carotid bruit 10/17/2012  . Other nonspecific abnormal cardiovascular system function study 04/10/2012  . History of sciatica   . Essential hypertension, benign   . Seasonal allergies   . HTN (hypertension) 04/04/2011  . Abdominal aortic aneurysm ([place]) 11/03/2010   Past Medical History  Diagnosis Date  . History of sciatica   . Essential hypertension, benign   . Seasonal allergies   . Back pain   . Hyperlipidemia   . Gout   . CAD (coronary artery disease)   . AAA (abdominal aortic aneurysm) ([place])   . PVD (peripheral vascular disease) ([place])   . Anxiety   . Allergic rhinitis    Past Surgical History  Procedure Laterality Date  . Cardiac catheterization      stent placement   Allergies  Allergen Reactions  . Biaxin [Clarithromycin] Shortness Of Breath  . Clarithromycin Shortness Of Breath  . Iodinated Diagnostic Agents Hives  . Cortisone Other (See Comments)    Turns red from breast up  . Iodine Hives    Dye from nuclear medicine test  . Prednisone    Prior to Admission medications   Medication Sig Start Date End Date Taking? Authorizing Provider  aspirin EC 81 MG tablet Take 81 mg by mouth daily.    Historical Provider, MD  cetirizine (ZYRTEC) 10 MG tablet Take 10 mg by mouth daily.    Historical Provider, MD  clopidogrel (PLAVIX) 75 MG tablet Take 75 mg by mouth daily.    Historical Provider, MD  CRESTOR 10 MG tablet TAKE 1 TABLET BY MOUTH ONCE DAILY "OFFICE VISIT/LABS NEEDED FOR REFILLS 03/04/15   [name], MD  lisinopril (PRINIVIL,ZESTRIL) 10 MG tablet TAKE ONE TABLET BY MOUTH DAILY (NEED OFFICE VISIT FOR ADDITIONAL REFILLS) 12/03/14   Mancel Bale, PA-C  metoprolol (LOPRESSOR) 50 MG tablet Take 1.5 tablets (75 mg total) by mouth 2 (two) times daily. 09/13/14   [name], MD  OVER THE COUNTER MEDICATION     Historical Provider, MD  ranitidine (ZANTAC) 75 MG tablet Take 75 mg by mouth 2 (two) times daily.    Historical Provider, MD  SALINE  NA Place into the nose as needed.    Historical Provider, MD   Social History   Social History  . Marital Status: Married    Spouse Name: N/A  . Number of Children: 1  . Years of Education: N/A   Occupational History  . retired    Social History Main Topics  . Smoking status: Current Every Day Smoker -- 0.50 packs/day for 50 years    Types: Cigarettes  . Smokeless tobacco: Never Used  . Alcohol Use: No     Comment: sometimes mixed drinks or beers  . Drug Use: No  . Sexual Activity: Not on file   Other Topics Concern  . Not on file   Social History Narrative   Married   Exercise: No   Education: GED     Review of Systems  HENT: Positive for rhinorrhea and sneezing.   Endocrine: Positive for cold intolerance.  Genitourinary: Positive for difficulty urinating.  Musculoskeletal: Positive for arthralgias.  All  other systems reviewed and are negative. 13 point ROS reviewed on patient health survey. Negative other than listed above or in nursing note. See nursing note.      Objective:   Physical Exam  Constitutional: He is oriented to person, place, and time. He appears well-developed and well-nourished.  HENT:  Head: Normocephalic and atraumatic.  Right Ear: External ear normal.  Left Ear: External ear normal.  Mouth/Throat: Oropharynx is clear and moist.  Eyes: Conjunctivae and EOM are normal. Pupils are equal, round, and reactive to light.  Neck: Normal range of motion. Neck supple. No thyromegaly present.  Cardiovascular: Normal rate, regular rhythm, normal heart sounds and intact distal pulses.   Decreased cap refill diffusely, lower extremities.  Pulmonary/Chest: Effort normal and breath sounds normal. No respiratory distress. He has no wheezes.  Abdominal: Soft. He exhibits no distension. There is no tenderness. Hernia confirmed negative in the right inguinal area and confirmed negative in the left inguinal area.  Genitourinary:  Prostate slightly enlarged, no  nodules, non-tender. No hernia.  Musculoskeletal: Normal range of motion. He exhibits no edema or tenderness.  No lower extremity edema.  Lymphadenopathy:    He has no cervical adenopathy.  Neurological: He is alert and oriented to person, place, and time. He has normal reflexes.  Skin: Skin is warm and dry.  Psychiatric: He has a normal mood and affect. His behavior is normal.  Vitals reviewed.   Filed Vitals:   03/23/15 0842  BP: 120/82  Pulse: 70  Temp: 97.4 F (36.3 C)  TempSrc: Oral  Resp: 16  Height: 6' 0.75" (1.848 m)  Weight: 197 lb 6.4 oz (89.54 kg)  SpO2: 98%         Assessment & Plan:   Dixie Jafri is a 70 y.o. male Medicare annual wellness visit, subsequent - Plan: metoprolol (LOPRESSOR) 50 MG tablet  --anticipatory guidance as below in AVS, screening labs above. Health maintenance items as above in HPI discussed/recommended as applicable.   -He declined immunizations as above. Advised to let me know if he changes his mind.  Essential hypertension - Plan: TSH, lisinopril (PRINIVIL,ZESTRIL) 10 MG tablet, lisinopril (PRINIVIL,ZESTRIL) 5 MG tablet, metoprolol (LOPRESSOR) 50 MG tablet  -Stable. No medication changes for now. Continue follow-up with vascular specialist with history of PVD, and AAA which reportedly has been stable.  Hyperlipidemia - Plan: Lipid panel, COMPLETE METABOLIC PANEL WITH GFR, rosuvastatin (CRESTOR) 10 MG tablet, metoprolol (LOPRESSOR) 50 MG tablet  -With underlying PVD. Continue Crestor 10 mg daily. CMP, lipid panel pending  Encounter for screening for malignant neoplasm of prostate - Plan: PSA, Medicare, metoprolol (LOPRESSOR) 50 MG tablet  -We discussed pros and cons of prostate cancer screening, and after this discussion, he chose to have screening done. PSA obtained, and no concerning findings on DRE.   Cold feeling - Plan: TSH  Screening for hypothyroidism with TSH. RTC to discuss further if worsening symptoms.  Meds ordered this  encounter  Medications  . DISCONTD: lisinopril (PRINIVIL,ZESTRIL) 5 MG tablet    Sig: Take 5 mg by mouth daily.  . rosuvastatin (CRESTOR) 10 MG tablet    Sig: Take 1 tablet (10 mg total) by mouth daily.    Dispense:  90 tablet    Refill:  1  . lisinopril (PRINIVIL,ZESTRIL) 10 MG tablet    Sig: TAKE ONE TABLET BY MOUTH DAILY    Dispense:  90 tablet    Refill:  1  . lisinopril (PRINIVIL,ZESTRIL) 5 MG tablet    Sig:  Take 1 tablet (5 mg total) by mouth daily.    Dispense:  90 tablet    Refill:  1  . metoprolol (LOPRESSOR) 50 MG tablet    Sig: Take 1.5 tablets (75 mg total) by mouth 2 (two) times daily.    Dispense:  270 tablet    Refill:  1   Patient Instructions  You should receive a call or letter about your lab results within the next week to 10 days.  If trouble initiating urine increases or other new urinary symptoms - return to discuss further.  Recommend audiologist or hearing test if continued difficulty with hearing to make sure you do not require hearing aids.  See info on advanced directives and bring a copy for [place] to place on file.  Keep follow up with other specialists including ENT for your sinuses. If they do not think your cough is due to allergies, can look into possibly changing lisinopril.  Return to the clinic or go to the nearest emergency room if any of your symptoms worsen or new symptoms occur.  Keeping you healthy  Get these tests  Blood pressure- Have your blood pressure checked once a year by your healthcare provider.  Normal blood pressure is 120/80  Weight- Have your body mass index (BMI) calculated to screen for obesity.  BMI is a measure of body fat based on height and weight. You can also calculate your own BMI at [email or web address].  Cholesterol- Have your cholesterol checked every year.  Diabetes- Have your blood sugar checked regularly if you have high blood pressure, high cholesterol, have a family history of diabetes or if you are  overweight.  Screening for Colon Cancer- Colonoscopy starting at age 21.  Screening may begin sooner depending on your family history and other health conditions. Follow up colonoscopy as directed by your Gastroenterologist.  Screening for Prostate Cancer- Both blood work (PSA) and a rectal exam help screen for Prostate Cancer.  Screening begins at age 52 with African-American men and at age 61 with Caucasian men.  Screening may begin sooner depending on your family history.  Take these medicines  Aspirin- One aspirin daily can help prevent Heart disease and Stroke.  Flu shot- Every fall.  Tetanus- Every 10 years.  Zostavax- Once after the age of 49 to prevent Shingles.  Pneumonia shot- Once after the age of 61; if you are younger than 65, ask your healthcare provider if you need a Pneumonia shot.  Take these steps  Don't smoke- If you do smoke, talk to your doctor about quitting.  For tips on how to quit, go to www.smokefree.gov or call 1-800-QUIT-NOW.  Be physically active- Exercise 5 days a week for at least 30 minutes.  If you are not already physically active start slow and gradually work up to 30 minutes of moderate physical activity.  Examples of moderate activity include walking briskly, mowing the yard, dancing, swimming, bicycling, etc.  Eat a healthy diet- Eat a variety of healthy food such as fruits, vegetables, low fat milk, low fat cheese, yogurt, lean meant, poultry, fish, beans, tofu, etc. For more information go to www.thenutritionsource.org  Drink alcohol in moderation- Limit alcohol intake to less than two drinks a day. Never drink and drive.  Dentist- Brush and floss twice daily; visit your dentist twice a year.  Depression- Your emotional health is as important as your physical health. If you're feeling down, or losing interest in things you would normally enjoy please talk to your healthcare provider.  Eye exam- Visit your eye doctor every year.  Safe sex- If  you may be exposed to a sexually transmitted infection, use a condom.  Seat belts- Seat belts can save your life; always wear one.  Smoke/Carbon Monoxide detectors- These detectors need to be installed on the appropriate level of your home.  Replace batteries at least once a year.  Skin cancer- When out in the sun, cover up and use sunscreen 15 SPF or higher.  Violence- If anyone is threatening you, please tell your healthcare provider.  Living Will/ Health care power of attorney- Speak with your healthcare provider and family.    I personally performed the services described in this documentation, which was scribed in my presence. The recorded information has been reviewed and considered, and addended by me as needed.

## 2015-03-24 LAB — PSA, MEDICARE: PSA: 2.69 ng/mL (ref <=4.00)

## 2015-04-03 ENCOUNTER — Other Ambulatory Visit: Payer: Self-pay | Admitting: Family Medicine

## 2015-04-09 ENCOUNTER — Other Ambulatory Visit: Payer: Self-pay | Admitting: Family Medicine

## 2015-04-09 ENCOUNTER — Encounter: Payer: Self-pay | Admitting: *Deleted

## 2015-05-18 DIAGNOSIS — Z95820 Peripheral vascular angioplasty status with implants and grafts: Secondary | ICD-10-CM | POA: Diagnosis not present

## 2015-05-18 DIAGNOSIS — I6523 Occlusion and stenosis of bilateral carotid arteries: Secondary | ICD-10-CM | POA: Diagnosis not present

## 2015-05-18 DIAGNOSIS — I70203 Unspecified atherosclerosis of native arteries of extremities, bilateral legs: Secondary | ICD-10-CM | POA: Diagnosis not present

## 2015-05-18 DIAGNOSIS — I714 Abdominal aortic aneurysm, without rupture: Secondary | ICD-10-CM | POA: Diagnosis not present

## 2015-05-19 ENCOUNTER — Other Ambulatory Visit: Payer: Self-pay | Admitting: Family Medicine

## 2015-06-01 DIAGNOSIS — I714 Abdominal aortic aneurysm, without rupture: Secondary | ICD-10-CM | POA: Diagnosis not present

## 2015-06-01 DIAGNOSIS — B372 Candidiasis of skin and nail: Secondary | ICD-10-CM | POA: Diagnosis not present

## 2015-06-01 DIAGNOSIS — F17210 Nicotine dependence, cigarettes, uncomplicated: Secondary | ICD-10-CM | POA: Diagnosis not present

## 2015-06-01 DIAGNOSIS — I70203 Unspecified atherosclerosis of native arteries of extremities, bilateral legs: Secondary | ICD-10-CM | POA: Diagnosis not present

## 2015-06-01 DIAGNOSIS — I10 Essential (primary) hypertension: Secondary | ICD-10-CM | POA: Diagnosis not present

## 2015-06-01 DIAGNOSIS — G458 Other transient cerebral ischemic attacks and related syndromes: Secondary | ICD-10-CM | POA: Diagnosis not present

## 2015-06-01 DIAGNOSIS — E7800 Pure hypercholesterolemia, unspecified: Secondary | ICD-10-CM | POA: Diagnosis not present

## 2015-06-01 DIAGNOSIS — Z7901 Long term (current) use of anticoagulants: Secondary | ICD-10-CM | POA: Diagnosis not present

## 2015-06-01 DIAGNOSIS — I6523 Occlusion and stenosis of bilateral carotid arteries: Secondary | ICD-10-CM | POA: Diagnosis not present

## 2015-06-01 DIAGNOSIS — I723 Aneurysm of iliac artery: Secondary | ICD-10-CM | POA: Diagnosis not present

## 2015-09-21 ENCOUNTER — Ambulatory Visit: Payer: Self-pay | Admitting: Family Medicine

## 2015-09-22 ENCOUNTER — Ambulatory Visit (INDEPENDENT_AMBULATORY_CARE_PROVIDER_SITE_OTHER): Payer: Medicare Other | Admitting: Family Medicine

## 2015-09-22 ENCOUNTER — Encounter: Payer: Self-pay | Admitting: Family Medicine

## 2015-09-22 ENCOUNTER — Other Ambulatory Visit: Payer: Self-pay | Admitting: Family Medicine

## 2015-09-22 VITALS — BP 132/86 | HR 76 | Temp 97.9°F | Resp 18 | Ht 72.75 in | Wt 198.0 lb

## 2015-09-22 DIAGNOSIS — E785 Hyperlipidemia, unspecified: Secondary | ICD-10-CM | POA: Diagnosis not present

## 2015-09-22 DIAGNOSIS — I10 Essential (primary) hypertension: Secondary | ICD-10-CM

## 2015-09-22 DIAGNOSIS — I739 Peripheral vascular disease, unspecified: Secondary | ICD-10-CM | POA: Diagnosis not present

## 2015-09-22 DIAGNOSIS — Z720 Tobacco use: Secondary | ICD-10-CM | POA: Diagnosis not present

## 2015-09-22 DIAGNOSIS — Z125 Encounter for screening for malignant neoplasm of prostate: Secondary | ICD-10-CM

## 2015-09-22 DIAGNOSIS — Z0000 Encounter for general adult medical examination without abnormal findings: Secondary | ICD-10-CM

## 2015-09-22 DIAGNOSIS — R739 Hyperglycemia, unspecified: Secondary | ICD-10-CM | POA: Diagnosis not present

## 2015-09-22 MED ORDER — LISINOPRIL 10 MG PO TABS: 10.0000 mg | ORAL_TABLET | Freq: Every day | ORAL | 1 refills | Status: DC

## 2015-09-22 MED ORDER — LISINOPRIL 5 MG PO TABS: ORAL_TABLET | ORAL | 1 refills | Status: DC

## 2015-09-22 MED ORDER — ROSUVASTATIN CALCIUM 10 MG PO TABS: 10.0000 mg | ORAL_TABLET | Freq: Every day | ORAL | 1 refills | Status: DC

## 2015-09-22 MED ORDER — METOPROLOL TARTRATE 50 MG PO TABS: 75.0000 mg | ORAL_TABLET | Freq: Two times a day (BID) | ORAL | 1 refills | Status: DC

## 2015-09-22 NOTE — Patient Instructions (Addendum)
Continue the good work on quitting smoking. If you are interested in Chantix - let me know.  [facility] offers smoking cessation clinics. Registration is required. To register call [phone] or register online at [email or web address]  No change in medications for now. Return within the next 1 week for fasting blood work. I will let you know once I receive those results, but please sign up for my chart today as it is the quickest way to receive your results.  Smoking Cessation, Tips for Success If you are ready to quit smoking, congratulations! You have chosen to help yourself be healthier. Cigarettes bring nicotine, tar, carbon monoxide, and other irritants into your body. Your lungs, heart, and blood vessels will be able to work better without these poisons. There are many different ways to quit smoking. Nicotine gum, nicotine patches, a nicotine inhaler, or nicotine nasal spray can help with physical craving. Hypnosis, support groups, and medicines help break the habit of smoking. WHAT THINGS CAN I DO TO MAKE QUITTING EASIER?  Here are some tips to help you quit for good:  Pick a date when you will quit smoking completely. Tell all of your friends and family about your plan to quit on that date.  Do not try to slowly cut down on the number of cigarettes you are smoking. Pick a quit date and quit smoking completely starting on that day.  Throw away all cigarettes.   Clean and remove all ashtrays from your home, work, and car.  On a card, write down your reasons for quitting. Carry the card with you and read it when you get the urge to smoke.  Cleanse your body of nicotine. Drink enough water and fluids to keep your urine clear or pale yellow. Do this after quitting to flush the nicotine from your body.  Learn to predict your moods. Do not let a bad situation be your excuse to have a cigarette. Some situations in your life might tempt you into wanting a cigarette.  Never have "just one"  cigarette. It leads to wanting another and another. Remind yourself of your decision to quit.  Change habits associated with smoking. If you smoked while driving or when feeling stressed, try other activities to replace smoking. Stand up when drinking your coffee. Brush your teeth after eating. Sit in a different chair when you read the paper. Avoid alcohol while trying to quit, and try to drink fewer caffeinated beverages. Alcohol and caffeine may urge you to smoke.  Avoid foods and drinks that can trigger a desire to smoke, such as sugary or spicy foods and alcohol.  Ask people who smoke not to smoke around you.  Have something planned to do right after eating or having a cup of coffee. For example, plan to take a walk or exercise.  Try a relaxation exercise to calm you down and decrease your stress. Remember, you may be tense and nervous for the first 2 weeks after you quit, but this will pass.  Find new activities to keep your hands busy. Play with a pen, coin, or rubber band. Doodle or draw things on paper.  Brush your teeth right after eating. This will help cut down on the craving for the taste of tobacco after meals. You can also try mouthwash.   Use oral substitutes in place of cigarettes. Try using lemon drops, carrots, cinnamon sticks, or chewing gum. Keep them handy so they are available when you have the urge to smoke.  When you have the  urge to smoke, try deep breathing.  Designate your home as a nonsmoking area.  If you are a heavy smoker, ask your health care provider about a prescription for nicotine chewing gum. It can ease your withdrawal from nicotine.  Reward yourself. Set aside the cigarette money you save and buy yourself something nice.  Look for support from others. Join a support group or smoking cessation program. Ask someone at home or at work to help you with your plan to quit smoking.  Always ask yourself, "Do I need this cigarette or is this just a reflex?"  Tell yourself, "Today, I choose not to smoke," or "I do not want to smoke." You are reminding yourself of your decision to quit.  Do not replace cigarette smoking with electronic cigarettes (commonly called e-cigarettes). The safety of e-cigarettes is unknown, and some may contain harmful chemicals.  If you relapse, do not give up! Plan ahead and think about what you will do the next time you get the urge to smoke. HOW WILL I FEEL WHEN I QUIT SMOKING? You may have symptoms of withdrawal because your body is used to nicotine (the addictive substance in cigarettes). You may crave cigarettes, be irritable, feel very hungry, cough often, get headaches, or have difficulty concentrating. The withdrawal symptoms are only temporary. They are strongest when you first quit but will go away within 10-14 days. When withdrawal symptoms occur, stay in control. Think about your reasons for quitting. Remind yourself that these are signs that your body is healing and getting used to being without cigarettes. Remember that withdrawal symptoms are easier to treat than the major diseases that smoking can cause.  Even after the withdrawal is over, expect periodic urges to smoke. However, these cravings are generally short lived and will go away whether you smoke or not. Do not smoke! WHAT RESOURCES ARE AVAILABLE TO HELP ME QUIT SMOKING? Your health care provider can direct you to community resources or hospitals for support, which may include:  Group support.  Education.  Hypnosis.  Therapy.   This information is not intended to replace advice given to you by your health care provider. Make sure you discuss any questions you have with your health care provider.   Document Released: 10/21/2003 Document Revised: 02/12/2014 Document Reviewed: 07/10/2012 Elsevier Interactive Patient Education 2016 [employer].    IF you received an x-ray today, you will receive an invoice from [facility]. Please contact  [facility] at [phone] with questions or concerns regarding your invoice.   IF you received labwork today, you will receive an invoice from [employer]. Please contact [facility] at [phone] with questions or concerns regarding your invoice.   Our billing staff will not be able to assist you with questions regarding bills from these companies.  You will be contacted with the lab results as soon as they are available. The fastest way to get your results is to activate your My Chart account. Instructions are located on the last page of this paperwork. If you have not heard from [place] regarding the results in 2 weeks, please contact this office.

## 2015-09-22 NOTE — Progress Notes (Signed)
By signing my name below, I, Seth [name], attest that this documentation has been prepared under the direction and in the presence of [name], MD.  Electronically Signed: [name], Medical Scribe. 09/22/15. 10:28 AM.  Subjective:    Patient ID: Seth [name], male    DOB: Jun 14, 1945, 70 y.o.   MRN: [id number]  HPI Chief Complaint  Patient presents with  . Follow-up    for BP and cholestrol     HPI Comments: Seth [name] is a 70 y.o. male  with a PMHx of PVD, AAA, HTN, HLD, and tobacco abuse who presents to the [facility] for HTN and HLD follow-up. Pt had 3 cups of coffee with a lot of sugar in them this morning.  HTN: He was stable last visit and continued on same medications as well as follow-up with his vascular specialist for PVD and AAA with has been stable to this time.  Pt hasn't checked his bp at home but every office visit when his blood pressure was checked it's never been over 134, and has been as low as 124. Pt mentions his blood pressure medication makes him sleepy when he takes them at night, but is fine when he takes them in the morning. Pt occasionally feels some dizziness when he stands up from being on the computer, but suspects this is not correlated to his medication. Pt denies experiencing any side affects while on his bp medication- no chest pain, SOB, chest tightness, muscle aches, melena, and any other symptoms. Lab Results  Component Value Date   CREATININE 1.09 03/23/2015   Tobacco Abuse: It's been 7-8 days since he's smoked anything and he's struggling to continue to stop smoking and he feels a little dyspnea. Pt mentions his body feels tight, and he's a little anxious. Pt denies going to [facility] cessation clinic, but he's read the website. Pt denies chest pain, chest tightness, and SOB.  HLD: Takes crestor 10 mg QD  Lab Results  Component Value Date   CHOL 136 03/23/2015   HDL 35 (L) 03/23/2015   LDLCALC 81 03/23/2015   TRIG 102 03/23/2015   CHOLHDL 3.9 03/23/2015   Lab Results  Component Value Date   ALT 10 03/23/2015   AST 15 03/23/2015   ALKPHOS 85 03/23/2015   BILITOT 0.6 03/23/2015   Hyperglycemia: Borderline glucose of 104 at last visit.  Patient Active Problem List   Diagnosis Date Noted  . PVD (peripheral vascular disease) ([place]) 10/15/2013  . Tobacco use disorder 10/15/2013  . Pure hypercholesterolemia 10/15/2013  . AAA (abdominal aortic aneurysm) ([place]) 10/15/2013  . Coronary artery disease involving native coronary artery of native heart without angina pectoris 10/15/2013  . Cataract 09/29/2013  . Colon cancer screening 09/25/2013  . AAA (abdominal aortic aneurysm) without rupture ([place]) 10/22/2012  . Nicotine addiction 10/17/2012  . Atherosclerosis of native arteries of the extremities with ulceration(440.23) 10/17/2012  . Carotid bruit 10/17/2012  . Other nonspecific abnormal cardiovascular system function study 04/10/2012  . History of sciatica   . Essential hypertension, benign   . Seasonal allergies   . HTN (hypertension) 04/04/2011  . Abdominal aortic aneurysm ([place]) 11/03/2010   Past Medical History:  Diagnosis Date  . AAA (abdominal aortic aneurysm) ([place])   . Allergic rhinitis   . Anxiety   . Back pain   . CAD (coronary artery disease)   . Essential hypertension, benign   . Gout   . History of sciatica   . Hyperlipidemia   .  PVD (peripheral vascular disease) ([place])   . Seasonal allergies    Past Surgical History:  Procedure Laterality Date  . CARDIAC CATHETERIZATION     stent placement   Allergies  Allergen Reactions  . Biaxin [Clarithromycin] Shortness Of Breath  . Clarithromycin Shortness Of Breath  . Iodinated Diagnostic Agents Hives  . Cortisone Other (See Comments)    Turns red from breast up  . Iodine Hives    Dye from nuclear medicine test  . Prednisone    Prior to Admission medications   Medication Sig Start Date End Date Taking? Authorizing  Provider  aspirin EC 81 MG tablet Take 81 mg by mouth daily.   Yes Historical Provider, MD  cetirizine (ZYRTEC) 10 MG tablet Take 10 mg by mouth daily.   Yes Historical Provider, MD  clopidogrel (PLAVIX) 75 MG tablet Take 75 mg by mouth daily.   Yes Historical Provider, MD  lisinopril (PRINIVIL,ZESTRIL) 10 MG tablet Take 10 mg by mouth daily.   Yes Historical Provider, MD  lisinopril (PRINIVIL,ZESTRIL) 5 MG tablet TAKE 1 TABLET (5 MG TOTAL) BY MOUTH DAILY. 05/19/15  Yes [name], MD  metoprolol (LOPRESSOR) 50 MG tablet Take 1.5 tablets (75 mg total) by mouth 2 (two) times daily. 03/23/15  Yes [name], MD  OVER THE COUNTER MEDICATION    Yes Historical Provider, MD  ranitidine (ZANTAC) 75 MG tablet Take 75 mg by mouth 2 (two) times daily.   Yes Historical Provider, MD  rosuvastatin (CRESTOR) 10 MG tablet Take 1 tablet (10 mg total) by mouth daily. 03/23/15  Yes [name], MD  SALINE NA Place into the nose as needed.   Yes Historical Provider, MD   Social History   Social History  . Marital status: Married    Spouse name: N/A  . Number of children: 1  . Years of education: N/A   Occupational History  . retired    Social History Main Topics  . Smoking status: Current Every Day Smoker    Packs/day: 0.50    Years: 50.00    Types: Cigarettes  . Smokeless tobacco: Never Used  . Alcohol use No     Comment: sometimes mixed drinks or beers  . Drug use: No  . Sexual activity: Not on file   Other Topics Concern  . Not on file   Social History Narrative   Married   Exercise: No   Education: GED   Depression screen [facility] 2/9 09/22/2015 03/23/2015 09/13/2014 09/07/2013  Decreased Interest 0 0 0 0  Down, Depressed, Hopeless 0 0 0 0  PHQ - 2 Score 0 0 0 0   Review of Systems  Constitutional: Negative for fatigue and unexpected weight change.  Eyes: Negative for visual disturbance.  Respiratory: Negative for cough, chest tightness and shortness of breath.     Cardiovascular: Negative for chest pain, palpitations and leg swelling.  Gastrointestinal: Negative for abdominal pain and blood in stool.  Musculoskeletal: Negative for myalgias.  Neurological: Negative for dizziness, light-headedness and headaches.  Psychiatric/Behavioral: The patient is nervous/anxious.    Objective:  Physical Exam  Constitutional: He is oriented to person, place, and time. He appears well-developed and well-nourished.  HENT:  Head: Normocephalic and atraumatic.  Eyes: EOM are normal. Pupils are equal, round, and reactive to light.  Neck: No JVD present. Carotid bruit is not present.  Cardiovascular: Normal rate, regular rhythm and normal heart sounds.   No murmur heard. Pulmonary/Chest: Effort normal and breath sounds normal.  He has no rales.  Musculoskeletal: He exhibits no edema.  Neurological: He is alert and oriented to person, place, and time.  Skin: Skin is warm and dry.  Psychiatric: He has a normal mood and affect.  Vitals reviewed.  BP 132/86   Pulse 76   Temp 97.9 F (36.6 C) (Oral)   Resp 18   Ht 6' 0.75" (1.848 m)   Wt 198 lb (89.8 kg)   SpO2 98%   BMI 26.30 kg/m  Assessment & Plan:   Seth [name] is a 70 y.o. male Essential hypertension - Plan: Comprehensive metabolic panel, lisinopril (PRINIVIL,ZESTRIL) 10 MG tablet, lisinopril (PRINIVIL,ZESTRIL) 5 MG tablet, metoprolol (LOPRESSOR) 50 MG tablet  -Stable. No med changes.  PVD (peripheral vascular disease) ([place])  -Stable. Continue follow-up with vascular surgeon.  Hyperlipidemia - Plan: Comprehensive metabolic panel, Lipid panel, metoprolol (LOPRESSOR) 50 MG tablet, rosuvastatin (CRESTOR) 10 MG tablet  -Tolerating Crestor. Labs pending. No change in regimen.  Tobacco abuse  -Commended on his efforts in quitting. Advised let me know if I can help, but handout given on tips for success as well as [place] smoking cessation clinic number.   Hyperglycemia - Plan: Hemoglobin A1C  -Prior  elevated glucose. Check A1c.  Meds ordered this encounter  Medications  . DISCONTD: lisinopril (PRINIVIL,ZESTRIL) 10 MG tablet    Sig: Take 10 mg by mouth daily.  Marland Kitchen lisinopril (PRINIVIL,ZESTRIL) 10 MG tablet    Sig: Take 1 tablet (10 mg total) by mouth daily.    Dispense:  90 tablet    Refill:  1  . lisinopril (PRINIVIL,ZESTRIL) 5 MG tablet    Sig: TAKE 1 TABLET (5 MG TOTAL) BY MOUTH DAILY.    Dispense:  90 tablet    Refill:  1  . metoprolol (LOPRESSOR) 50 MG tablet    Sig: Take 1.5 tablets (75 mg total) by mouth 2 (two) times daily.    Dispense:  270 tablet    Refill:  1  . rosuvastatin (CRESTOR) 10 MG tablet    Sig: Take 1 tablet (10 mg total) by mouth daily.    Dispense:  90 tablet    Refill:  1   Patient Instructions    Continue the good work on quitting smoking. If you are interested in [facility] - let me know.  [place] offers smoking cessation clinics. Registration is required. To register call [phone] or register online at [email or web address]  No change in medications for now. Return within the next 1 week for fasting blood work. I will let you know once I receive those results, but please sign up for my chart today as it is the quickest way to receive your results.  Smoking Cessation, Tips for Success If you are ready to quit smoking, congratulations! You have chosen to help yourself be healthier. Cigarettes bring nicotine, tar, carbon monoxide, and other irritants into your body. Your lungs, heart, and blood vessels will be able to work better without these poisons. There are many different ways to quit smoking. Nicotine gum, nicotine patches, a nicotine inhaler, or nicotine nasal spray can help with physical craving. Hypnosis, support groups, and medicines help break the habit of smoking. WHAT THINGS CAN I DO TO MAKE QUITTING EASIER?  Here are some tips to help you quit for good:  Pick a date when you will quit smoking completely. Tell all of your friends and family  about your plan to quit on that date.  Do not try to slowly cut down on the  number of cigarettes you are smoking. Pick a quit date and quit smoking completely starting on that day.  Throw away all cigarettes.   Clean and remove all ashtrays from your home, work, and car.  On a card, write down your reasons for quitting. Carry the card with you and read it when you get the urge to smoke.  Cleanse your body of nicotine. Drink enough water and fluids to keep your urine clear or pale yellow. Do this after quitting to flush the nicotine from your body.  Learn to predict your moods. Do not let a bad situation be your excuse to have a cigarette. Some situations in your life might tempt you into wanting a cigarette.  Never have "just one" cigarette. It leads to wanting another and another. Remind yourself of your decision to quit.  Change habits associated with smoking. If you smoked while driving or when feeling stressed, try other activities to replace smoking. Stand up when drinking your coffee. Brush your teeth after eating. Sit in a different chair when you read the paper. Avoid alcohol while trying to quit, and try to drink fewer caffeinated beverages. Alcohol and caffeine may urge you to smoke.  Avoid foods and drinks that can trigger a desire to smoke, such as sugary or spicy foods and alcohol.  Ask people who smoke not to smoke around you.  Have something planned to do right after eating or having a cup of coffee. For example, plan to take a walk or exercise.  Try a relaxation exercise to calm you down and decrease your stress. Remember, you may be tense and nervous for the first 2 weeks after you quit, but this will pass.  Find new activities to keep your hands busy. Play with a pen, coin, or rubber band. Doodle or draw things on paper.  Brush your teeth right after eating. This will help cut down on the craving for the taste of tobacco after meals. You can also try mouthwash.   Use  oral substitutes in place of cigarettes. Try using lemon drops, carrots, cinnamon sticks, or chewing gum. Keep them handy so they are available when you have the urge to smoke.  When you have the urge to smoke, try deep breathing.  Designate your home as a nonsmoking area.  If you are a heavy smoker, ask your health care provider about a prescription for nicotine chewing gum. It can ease your withdrawal from nicotine.  Reward yourself. Set aside the cigarette money you save and buy yourself something nice.  Look for support from others. Join a support group or smoking cessation program. Ask someone at home or at work to help you with your plan to quit smoking.  Always ask yourself, "Do I need this cigarette or is this just a reflex?" Tell yourself, "Today, I choose not to smoke," or "I do not want to smoke." You are reminding yourself of your decision to quit.  Do not replace cigarette smoking with electronic cigarettes (commonly called e-cigarettes). The safety of e-cigarettes is unknown, and some may contain harmful chemicals.  If you relapse, do not give up! Plan ahead and think about what you will do the next time you get the urge to smoke. HOW WILL I FEEL WHEN I QUIT SMOKING? You may have symptoms of withdrawal because your body is used to nicotine (the addictive substance in cigarettes). You may crave cigarettes, be irritable, feel very hungry, cough often, get headaches, or have difficulty concentrating. The withdrawal symptoms  are only temporary. They are strongest when you first quit but will go away within 10-14 days. When withdrawal symptoms occur, stay in control. Think about your reasons for quitting. Remind yourself that these are signs that your body is healing and getting used to being without cigarettes. Remember that withdrawal symptoms are easier to treat than the major diseases that smoking can cause.  Even after the withdrawal is over, expect periodic urges to smoke. However,  these cravings are generally short lived and will go away whether you smoke or not. Do not smoke! WHAT RESOURCES ARE AVAILABLE TO HELP ME QUIT SMOKING? Your health care provider can direct you to community resources or hospitals for support, which may include:  Group support.  Education.  Hypnosis.  Therapy.   This information is not intended to replace advice given to you by your health care provider. Make sure you discuss any questions you have with your health care provider.   Document Released: 10/21/2003 Document Revised: 02/12/2014 Document Reviewed: 07/10/2012 Elsevier Interactive Patient Education 2016 [employer].    IF you received an x-ray today, you will receive an invoice from [facility]. Please contact [facility] at [phone] with questions or concerns regarding your invoice.   IF you received labwork today, you will receive an invoice from [employer]. Please contact [facility] at [phone] with questions or concerns regarding your invoice.   Our billing staff will not be able to assist you with questions regarding bills from these companies.  You will be contacted with the lab results as soon as they are available. The fastest way to get your results is to activate your My Chart account. Instructions are located on the last page of this paperwork. If you have not heard from [place] regarding the results in 2 weeks, please contact this office.        I personally performed the services described in this documentation, which was scribed in my presence. The recorded information has been reviewed and considered, and addended by me as needed.   Signed,   [name], MD [facility].  09/23/15 1:54 PM

## 2015-12-01 DIAGNOSIS — I714 Abdominal aortic aneurysm, without rupture: Secondary | ICD-10-CM | POA: Diagnosis not present

## 2015-12-01 DIAGNOSIS — I70203 Unspecified atherosclerosis of native arteries of extremities, bilateral legs: Secondary | ICD-10-CM | POA: Diagnosis not present

## 2015-12-01 DIAGNOSIS — Z95820 Peripheral vascular angioplasty status with implants and grafts: Secondary | ICD-10-CM | POA: Diagnosis not present

## 2015-12-01 DIAGNOSIS — I6523 Occlusion and stenosis of bilateral carotid arteries: Secondary | ICD-10-CM | POA: Diagnosis not present

## 2015-12-07 DIAGNOSIS — J300 Vasomotor rhinitis: Secondary | ICD-10-CM | POA: Diagnosis not present

## 2015-12-07 DIAGNOSIS — F17210 Nicotine dependence, cigarettes, uncomplicated: Secondary | ICD-10-CM | POA: Diagnosis not present

## 2015-12-07 DIAGNOSIS — L509 Urticaria, unspecified: Secondary | ICD-10-CM | POA: Diagnosis not present

## 2015-12-13 DIAGNOSIS — F17210 Nicotine dependence, cigarettes, uncomplicated: Secondary | ICD-10-CM | POA: Diagnosis not present

## 2015-12-13 DIAGNOSIS — I723 Aneurysm of iliac artery: Secondary | ICD-10-CM | POA: Diagnosis not present

## 2015-12-13 DIAGNOSIS — I739 Peripheral vascular disease, unspecified: Secondary | ICD-10-CM | POA: Diagnosis not present

## 2015-12-13 DIAGNOSIS — I714 Abdominal aortic aneurysm, without rupture: Secondary | ICD-10-CM | POA: Diagnosis not present

## 2015-12-13 DIAGNOSIS — I6523 Occlusion and stenosis of bilateral carotid arteries: Secondary | ICD-10-CM | POA: Diagnosis not present

## 2016-02-28 ENCOUNTER — Telehealth: Payer: Self-pay

## 2016-02-28 NOTE — Telephone Encounter (Signed)
Pt calling because insurance is no longer going to cover his crestor and he is needing to see if dr. Carlota Raspberry could prescribe him something in place of that so his insurance will cover insurance is suggesting a generic crestor     Please advise  [phone]

## 2016-02-29 MED ORDER — ATORVASTATIN CALCIUM 20 MG PO TABS: 20.0000 mg | ORAL_TABLET | Freq: Every day | ORAL | 1 refills | Status: DC

## 2016-02-29 NOTE — Telephone Encounter (Signed)
Change to Lipitor 20 mg daily. If any new side effects, return to discuss. Otherwise plan on rechecking lipids in the next 6-8 weeks as medication changed.

## 2016-02-29 NOTE — Telephone Encounter (Signed)
Can we change to Lipitor or Simvastatin? Please advise

## 2016-03-01 NOTE — Telephone Encounter (Signed)
Pt advised.

## 2016-03-19 DIAGNOSIS — H5213 Myopia, bilateral: Secondary | ICD-10-CM | POA: Diagnosis not present

## 2016-03-19 DIAGNOSIS — H2513 Age-related nuclear cataract, bilateral: Secondary | ICD-10-CM | POA: Diagnosis not present

## 2016-03-23 ENCOUNTER — Other Ambulatory Visit: Payer: Self-pay | Admitting: Family Medicine

## 2016-03-23 DIAGNOSIS — I10 Essential (primary) hypertension: Secondary | ICD-10-CM

## 2016-03-23 DIAGNOSIS — E785 Hyperlipidemia, unspecified: Secondary | ICD-10-CM

## 2016-03-29 ENCOUNTER — Ambulatory Visit: Payer: Self-pay | Admitting: Family Medicine

## 2016-04-12 ENCOUNTER — Encounter: Payer: Self-pay | Admitting: Family Medicine

## 2016-04-12 ENCOUNTER — Ambulatory Visit (INDEPENDENT_AMBULATORY_CARE_PROVIDER_SITE_OTHER): Payer: Medicare Other | Admitting: Family Medicine

## 2016-04-12 VITALS — BP 129/71 | HR 60 | Temp 97.7°F | Resp 16 | Ht 72.9 in | Wt 192.4 lb

## 2016-04-12 DIAGNOSIS — Z0000 Encounter for general adult medical examination without abnormal findings: Secondary | ICD-10-CM | POA: Diagnosis not present

## 2016-04-12 DIAGNOSIS — Z1159 Encounter for screening for other viral diseases: Secondary | ICD-10-CM

## 2016-04-12 DIAGNOSIS — Z720 Tobacco use: Secondary | ICD-10-CM

## 2016-04-12 DIAGNOSIS — I714 Abdominal aortic aneurysm, without rupture, unspecified: Secondary | ICD-10-CM

## 2016-04-12 DIAGNOSIS — I10 Essential (primary) hypertension: Secondary | ICD-10-CM

## 2016-04-12 DIAGNOSIS — E785 Hyperlipidemia, unspecified: Secondary | ICD-10-CM | POA: Diagnosis not present

## 2016-04-12 DIAGNOSIS — R400 Somnolence: Secondary | ICD-10-CM | POA: Diagnosis not present

## 2016-04-12 DIAGNOSIS — Z125 Encounter for screening for malignant neoplasm of prostate: Secondary | ICD-10-CM | POA: Diagnosis not present

## 2016-04-12 DIAGNOSIS — I739 Peripheral vascular disease, unspecified: Secondary | ICD-10-CM | POA: Diagnosis not present

## 2016-04-12 DIAGNOSIS — R739 Hyperglycemia, unspecified: Secondary | ICD-10-CM

## 2016-04-12 MED ORDER — METOPROLOL TARTRATE 50 MG PO TABS: 75.0000 mg | ORAL_TABLET | Freq: Two times a day (BID) | ORAL | 1 refills | Status: DC

## 2016-04-12 MED ORDER — LISINOPRIL 5 MG PO TABS
ORAL_TABLET | ORAL | 1 refills | Status: DC
Start: 2016-04-12 — End: 2016-10-22

## 2016-04-12 MED ORDER — ATORVASTATIN CALCIUM 20 MG PO TABS: 20.0000 mg | ORAL_TABLET | Freq: Every day | ORAL | 1 refills | Status: DC

## 2016-04-12 MED ORDER — LISINOPRIL 10 MG PO TABS: 10.0000 mg | ORAL_TABLET | Freq: Every day | ORAL | 1 refills | Status: DC

## 2016-04-12 NOTE — Patient Instructions (Addendum)
No change in medications for now. If you continue to feel sleepy during the day in the next few weeks, I would recommend evaluation with a sleep specialist as obstructive sleep apnea can sometimes present with those symptoms. Keep up the good work with quitting smoking, there are some helpful hints below. Let me know if I can help further. Follow-up in 6 months.   Steps to Quit Smoking Smoking tobacco can be harmful to your health and can affect almost every organ in your body. Smoking puts you, and those around you, at risk for developing many serious chronic diseases. Quitting smoking is difficult, but it is one of the best things that you can do for your health. It is never too late to quit. What are the benefits of quitting smoking? When you quit smoking, you lower your risk of developing serious diseases and conditions, such as:  Lung cancer or lung disease, such as COPD.  Heart disease.  Stroke.  Heart attack.  Infertility.  Osteoporosis and bone fractures. Additionally, symptoms such as coughing, wheezing, and shortness of breath may get better when you quit. You may also find that you get sick less often because your body is stronger at fighting off colds and infections. If you are pregnant, quitting smoking can help to reduce your chances of having a baby of low birth weight. How do I get ready to quit? When you decide to quit smoking, create a plan to make sure that you are successful. Before you quit:  Pick a date to quit. Set a date within the next two weeks to give you time to prepare.  Write down the reasons why you are quitting. Keep this list in places where you will see it often, such as on your bathroom mirror or in your car or wallet.  Identify the people, places, things, and activities that make you want to smoke (triggers) and avoid them. Make sure to take these actions:  Throw away all cigarettes at home, at work, and in your car.  Throw away smoking accessories,  such as Scientist, research (medical).  Clean your car and make sure to empty the ashtray.  Clean your home, including curtains and carpets.  Tell your family, friends, and coworkers that you are quitting. Support from your loved ones can make quitting easier.  Talk with your health care provider about your options for quitting smoking.  Find out what treatment options are covered by your health insurance. What strategies can I use to quit smoking? Talk with your healthcare provider about different strategies to quit smoking. Some strategies include:  Quitting smoking altogether instead of gradually lessening how much you smoke over a period of time. Research shows that quitting "cold [place]" is more successful than gradually quitting.  Attending in-person counseling to help you build problem-solving skills. You are more likely to have success in quitting if you attend several counseling sessions. Even short sessions of 10 minutes can be effective.  Finding resources and support systems that can help you to quit smoking and remain smoke-free after you quit. These resources are most helpful when you use them often. They can include:  Online chats with a Social worker.  Telephone quitlines.  Printed Furniture conservator/restorer.  Support groups or group counseling.  Text messaging programs.  Mobile phone applications.  Taking medicines to help you quit smoking. (If you are pregnant or breastfeeding, talk with your health care provider first.) Some medicines contain nicotine and some do not. Both types of medicines help  with cravings, but the medicines that include nicotine help to relieve withdrawal symptoms. Your health care provider may recommend:  Nicotine patches, gum, or lozenges.  Nicotine inhalers or sprays.  Non-nicotine medicine that is taken by mouth. Talk with your health care provider about combining strategies, such as taking medicines while you are also receiving in-person counseling.  Using these two strategies together makes you more likely to succeed in quitting than if you used either strategy on its own. If you are pregnant or breastfeeding, talk with your health care provider about finding counseling or other support strategies to quit smoking. Do not take medicine to help you quit smoking unless told to do so by your health care provider. What things can I do to make it easier to quit? Quitting smoking might feel overwhelming at first, but there is a lot that you can do to make it easier. Take these important actions:  Reach out to your family and friends and ask that they support and encourage you during this time. Call telephone quitlines, reach out to support groups, or work with a counselor for support.  Ask people who smoke to avoid smoking around you.  Avoid places that trigger you to smoke, such as bars, parties, or smoke-break areas at work.  Spend time around people who do not smoke.  Lessen stress in your life, because stress can be a smoking trigger for some people. To lessen stress, try:  Exercising regularly.  Deep-breathing exercises.  Yoga.  Meditating.  Performing a body scan. This involves closing your eyes, scanning your body from head to toe, and noticing which parts of your body are particularly tense. Purposefully relax the muscles in those areas.  Download or purchase mobile phone or tablet apps (applications) that can help you stick to your quit plan by providing reminders, tips, and encouragement. There are many free apps, such as QuitGuide from the [employer] Office manager for Disease Control and Prevention). You can find other support for quitting smoking (smoking cessation) through smokefree.gov and other websites. How will I feel when I quit smoking? Within the first 24 hours of quitting smoking, you may start to feel some withdrawal symptoms. These symptoms are usually most noticeable 2-3 days after quitting, but they usually do not last beyond  2-3 weeks. Changes or symptoms that you might experience include:  Mood swings.  Restlessness, anxiety, or irritation.  Difficulty concentrating.  Dizziness.  Strong cravings for sugary foods in addition to nicotine.  Mild weight gain.  Constipation.  Nausea.  Coughing or a sore throat.  Changes in how your medicines work in your body.  A depressed mood.  Difficulty sleeping (insomnia). After the first 2-3 weeks of quitting, you may start to notice more positive results, such as:  Improved sense of smell and taste.  Decreased coughing and sore throat.  Slower heart rate.  Lower blood pressure.  Clearer skin.  The ability to breathe more easily.  Fewer sick days. Quitting smoking is very challenging for most people. Do not get discouraged if you are not successful the first time. Some people need to make many attempts to quit before they achieve long-term success. Do your best to stick to your quit plan, and talk with your health care provider if you have any questions or concerns. This information is not intended to replace advice given to you by your health care provider. Make sure you discuss any questions you have with your health care provider. Document Released: 01/16/2001 Document Revised: 09/20/2015 Document  Reviewed: 06/08/2014 Elsevier Interactive Patient Education  2017 [employer].   IF you received an x-ray today, you will receive an invoice from [facility]. Please contact [facility] at [phone] with questions or concerns regarding your invoice.   IF you received labwork today, you will receive an invoice from [facility] [facility]. Please contact [facility] at [phone] with questions or concerns regarding your invoice.   Our billing staff will not be able to assist you with questions regarding bills from these companies.  You will be contacted with the lab results as soon as they are available. The fastest way to get your results is to  activate your My Chart account. Instructions are located on the last page of this paperwork. If you have not heard from [place] regarding the results in 2 weeks, please contact this office.

## 2016-04-12 NOTE — Progress Notes (Signed)
By signing my name below, I, Seth [name], attest that this documentation has been prepared under the direction and in the presence of Seth Ray, MD.  Electronically Signed: Verlee [name], Medical Scribe. 04/12/16. 2:09 PM.  Subjective:    Patient ID: Seth [name], male    DOB: 22-Jan-1946, 71 y.o.   MRN: [id number]  HPI Chief Complaint  Patient presents with  . Annual Exam    HPI Comments: Seth [name] is a 71 y.o. male with a PMHx of HTN, peripheral vascular disease, AAA, tobacco abuse, and HLD who presents to the [facility] for his annual complete physical.  HTN: He is currently taking lisinopril 15 mg QD, metoprolol 75 mg BID, and ASA 81 mg QD. He is also on plavix with history of PVD as well as some non obstructive CAD. Pt is compliant with his medication. The highest his bp has ran is 133/88. Denies experiencing any negative side effects.  Abdominal Aortic Aneurism: Followed by vascular surgeon with [facility]. Last seen there Nov 2017. Had bilateral carotid artery stenosis. Planned for recheck in 1 year. Planned for recheck AOI and ABI in 1 year for his AAA and iliac artery aneurism, no indication for repair of those areas at this time. He was not having rest pain or claudication with his PVD. Planned for recheck AVI in 1 year. Pt now has a yearly appts with his cardiologist since there hasn't been any changes in the past 4-5 years.  Tobacco abuse: He has tried quiting in the past and have given his resources through [employer]. Pt hasn't smoked in the past 2-3 weeks and has been chewing smaller amounts of recommended nicotine gum for relief.  Somnolence: Pt notices he's nodding off more now that he's quit smoking, reporting day time somnolence when sitting in front of the computer. He's had similar sxs when he cut back on his caffeine intake in the past but improved a month later. He denies new snoring, or PMHx of OSA.  HLD: On crestor 10 mg QD. He was  advised to return for fasting labs after our last visit, but has not had those performed yet. Pt is compliant with his medication. Denies experiencing any negative side effects. Lab Results  Component Value Date   CHOL 136 03/23/2015   HDL 35 (L) 03/23/2015   LDLCALC 81 03/23/2015   TRIG 102 03/23/2015   CHOLHDL 3.9 03/23/2015   Lab Results  Component Value Date   ALT 10 03/23/2015   AST 15 03/23/2015   ALKPHOS 85 03/23/2015   BILITOT 0.6 03/23/2015   Cancer Screening: Prostate CA: Pt agrees to CA screening with DRE and blood work.  Lab Results  Component Value Date   PSA 2.69 03/23/2015   PSA 2.36 09/07/2013   PSA 2.93 04/27/2011  Colon CA: Colonoscopy Sept 2015. Planned for repeat in 3 years, as previous polyps where tubular adenomas. Pt reports he was told to come back in 2 years due to the adenomas found and he hasn't had a follow-up appt yet.  Immunizations: He has refused PNA, flu and shingle vaccine in the past. Pt deffered all the vaccines mentioned today. Immunization History  Administered Date(s) Administered  . Hepatitis B 02/06/2000  . Td 02/05/2005   Vision: Pt last saw his ophthalmologist last week. He has cataracts but not severe enough to get treatment,  Visual Acuity Screening   Right eye Left eye Both eyes  Without correction:     With correction: 20/20 20/20  45/20   Dentist: Pt wears full dentures.  Exercise: Pt does not exercise. Reports leg soreness that's similar to being out of shape and different from the time he had stents placed. Denies myalgias, chest pain, chest tightness.  Hep C Screening: Has not had done in the past. Pt agrees to screening.  Depression Screening: Depression screen [facility] 2/9 04/12/2016 09/22/2015 03/23/2015 09/13/2014 09/07/2013  Decreased Interest 0 0 0 0 0  Down, Depressed, Hopeless 0 0 0 0 0  PHQ - 2 Score 0 0 0 0 0   Advance Directives: He does not currently have advance directives. Pt suspects he has a living will  Fall  Screening: Fall Risk  04/12/2016 09/22/2015 03/23/2015 09/13/2014 09/07/2013  Falls in the past year? No No No No No   Functional Status Survey: Is the patient deaf or have difficulty hearing?: No Does the patient have difficulty seeing, even when wearing glasses/contacts?: No Does the patient have difficulty concentrating, remembering, or making decisions?: No Does the patient have difficulty walking or climbing stairs?: No Does the patient have difficulty dressing or bathing?: No Does the patient have difficulty doing errands alone such as visiting a doctor's office or shopping?: No  Patient Active Problem List   Diagnosis Date Noted  . PVD (peripheral vascular disease) ([place]) 10/15/2013  . Tobacco use disorder 10/15/2013  . Pure hypercholesterolemia 10/15/2013  . AAA (abdominal aortic aneurysm) ([place]) 10/15/2013  . Coronary artery disease involving native coronary artery of native heart without angina pectoris 10/15/2013  . Cataract 09/29/2013  . Colon cancer screening 09/25/2013  . AAA (abdominal aortic aneurysm) without rupture ([place]) 10/22/2012  . Nicotine addiction 10/17/2012  . Atherosclerosis of native arteries of the extremities with ulceration(440.23) 10/17/2012  . Carotid bruit 10/17/2012  . Other nonspecific abnormal cardiovascular system function study 04/10/2012  . History of sciatica   . Essential hypertension, benign   . Seasonal allergies   . HTN (hypertension) 04/04/2011  . Abdominal aortic aneurysm ([place]) 11/03/2010   Past Medical History:  Diagnosis Date  . AAA (abdominal aortic aneurysm) ([place])   . Allergic rhinitis   . Anxiety   . Back pain   . CAD (coronary artery disease)   . Essential hypertension, benign   . Gout   . History of sciatica   . Hyperlipidemia   . PVD (peripheral vascular disease) ([place])   . Seasonal allergies    Past Surgical History:  Procedure Laterality Date  . CARDIAC CATHETERIZATION     stent placement   Allergies  Allergen Reactions    . Biaxin [Clarithromycin] Shortness Of Breath  . Clarithromycin Shortness Of Breath  . Iodinated Diagnostic Agents Hives  . Cortisone Other (See Comments)    Turns red from breast up  . Iodine Hives    Dye from nuclear medicine test  . Prednisone    Prior to Admission medications   Medication Sig Start Date End Date Taking? Authorizing Provider  aspirin EC 81 MG tablet Take 81 mg by mouth daily.    Historical Provider, MD  atorvastatin (LIPITOR) 20 MG tablet Take 1 tablet (20 mg total) by mouth daily at 6 PM. 02/29/16   [name], MD  cetirizine (ZYRTEC) 10 MG tablet Take 10 mg by mouth daily.    Historical Provider, MD  clopidogrel (PLAVIX) 75 MG tablet Take 75 mg by mouth daily.    Historical Provider, MD  lisinopril (PRINIVIL,ZESTRIL) 10 MG tablet Take 1 tablet (10 mg total) by mouth daily. 09/22/15  [name], MD  lisinopril (PRINIVIL,ZESTRIL) 5 MG tablet TAKE 1 TABLET (5 MG TOTAL) BY MOUTH DAILY. 09/22/15   [name], MD  metoprolol (LOPRESSOR) 50 MG tablet TAKE 1.5 TABLETS (75 MG TOTAL) BY MOUTH 2 (TWO) TIMES DAILY. 03/23/16   [name], MD  OVER THE COUNTER MEDICATION     Historical Provider, MD  ranitidine (ZANTAC) 75 MG tablet Take 75 mg by mouth 2 (two) times daily.    Historical Provider, MD  SALINE NA Place into the nose as needed.    Historical Provider, MD   Social History   Social History  . Marital status: Married    Spouse name: N/A  . Number of children: 1  . Years of education: N/A   Occupational History  . retired    Social History Main Topics  . Smoking status: Current Every Day Smoker    Packs/day: 0.50    Years: 50.00    Types: Cigarettes  . Smokeless tobacco: Never Used  . Alcohol use No     Comment: sometimes mixed drinks or beers  . Drug use: No  . Sexual activity: Not on file   Other Topics Concern  . Not on file   Social History Narrative   Married   Exercise: No   Education: GED   Review of Systems  13 point  ROS was negative. Objective:  Physical Exam  Constitutional: He is oriented to person, place, and time. He appears well-developed and well-nourished.  HENT:  Head: Normocephalic and atraumatic.  Right Ear: External ear normal.  Left Ear: External ear normal.  Mouth/Throat: Oropharynx is clear and moist.  Eyes: Conjunctivae and EOM are normal. Pupils are equal, round, and reactive to light.  Neck: Normal range of motion. Neck supple. No thyromegaly present.  Cardiovascular: Normal rate, regular rhythm, normal heart sounds and intact distal pulses.   Pulmonary/Chest: Effort normal and breath sounds normal. No respiratory distress. He has no wheezes.  Abdominal: Soft. He exhibits no distension. There is no tenderness. Hernia confirmed negative in the right inguinal area and confirmed negative in the left inguinal area.  Genitourinary: Prostate normal.  Musculoskeletal: Normal range of motion. He exhibits no edema or tenderness.  Lymphadenopathy:    He has no cervical adenopathy.  Neurological: He is alert and oriented to person, place, and time. He has normal reflexes.  Skin: Skin is warm and dry.  Psychiatric: He has a normal mood and affect. His behavior is normal.  Vitals reviewed.   Vitals:   04/12/16 1343  BP: 129/71  Pulse: 60  Resp: 16  Temp: 97.7 F (36.5 C)  TempSrc: Oral  SpO2: 99%  Weight: 192 lb 6.4 oz (87.3 kg)  Height: 6' 0.9" (1.852 m)   Body mass index is 25.45 kg/m. Assessment & Plan:    Naveed Humphres is a 71 y.o. male Medicare annual wellness visit, subsequent  - anticipatory guidance as below in AVS, screening labs if needed. Health maintenance items as above in HPI discussed/recommended as applicable.   - no concerning responses on depression, fall, or functional status screening. Any positive responses noted as above. Advanced directives discussed as in CHL.   Essential hypertension - Plan: lisinopril (PRINIVIL,ZESTRIL) 10 MG tablet, lisinopril  (PRINIVIL,ZESTRIL) 5 MG tablet, metoprolol (LOPRESSOR) 50 MG tablet, CANCELED: Comprehensive metabolic panel  - Stable. No change in medications. Labs pending  Hyperlipidemia, unspecified hyperlipidemia type - Plan: atorvastatin (LIPITOR) 20 MG tablet, metoprolol (LOPRESSOR) 50 MG tablet, Comprehensive metabolic panel,  Lipid panel  - Continue Lipitor 20 mg daily, check labs.  Tobacco abuse  -Cessation discussed and encouraged on the times that he has cut back., resources provided. Smoking cessation especially important with his history of PVD, AAA   PVD (peripheral vascular disease) ([place]) AAA (abdominal aortic aneurysm) without rupture ([place])  Reportedly stable, with yearly follow-ups with his vascular specialist. Previous notes reviewed.  Need for hepatitis C screening test - Plan: Hepatitis C antibody  Screening for prostate cancer - Plan: PSA  -We discussed pros and cons of prostate cancer screening, and after this discussion, he chose to have screening done. PSA obtained, and no concerning findings on DRE.   Daytime somnolence  - Notes with cutting back on smoking. Also noticed with cutting back on caffeine in past, but was transient. Potential for obstructive sleep apnea discussed. If symptoms persist, recommended evaluation with sleep specialist.  Special screening for malignant neoplasm of prostate - Plan: PSA  - We discussed pros and cons of prostate cancer screening, and after this discussion, he chose to have screening done. PSA obtained, and no concerning findings on DRE.   Hyperglycemia - Plan: Hemoglobin A1C, CANCELED: Hemoglobin A1C  -Check A1c.  Meds ordered this encounter  Medications  . atorvastatin (LIPITOR) 20 MG tablet    Sig: Take 1 tablet (20 mg total) by mouth daily at 6 PM.    Dispense:  90 tablet    Refill:  1  . lisinopril (PRINIVIL,ZESTRIL) 10 MG tablet    Sig: Take 1 tablet (10 mg total) by mouth daily.    Dispense:  90 tablet    Refill:  1  .  lisinopril (PRINIVIL,ZESTRIL) 5 MG tablet    Sig: TAKE 1 TABLET (5 MG TOTAL) BY MOUTH DAILY.    Dispense:  90 tablet    Refill:  1  . metoprolol (LOPRESSOR) 50 MG tablet    Sig: Take 1.5 tablets (75 mg total) by mouth 2 (two) times daily.    Dispense:  270 tablet    Refill:  1   Patient Instructions   No change in medications for now. If you continue to feel sleepy during the day in the next few weeks, I would recommend evaluation with a sleep specialist as obstructive sleep apnea can sometimes present with those symptoms. Keep up the good work with quitting smoking, there are some helpful hints below. Let me know if I can help further. Follow-up in 6 months.   Steps to Quit Smoking Smoking tobacco can be harmful to your health and can affect almost every organ in your body. Smoking puts you, and those around you, at risk for developing many serious chronic diseases. Quitting smoking is difficult, but it is one of the best things that you can do for your health. It is never too late to quit. What are the benefits of quitting smoking? When you quit smoking, you lower your risk of developing serious diseases and conditions, such as:  Lung cancer or lung disease, such as COPD.  Heart disease.  Stroke.  Heart attack.  Infertility.  Osteoporosis and bone fractures. Additionally, symptoms such as coughing, wheezing, and shortness of breath may get better when you quit. You may also find that you get sick less often because your body is stronger at fighting off colds and infections. If you are pregnant, quitting smoking can help to reduce your chances of having a baby of low birth weight. How do I get ready to quit? When you decide to  quit smoking, create a plan to make sure that you are successful. Before you quit:  Pick a date to quit. Set a date within the next two weeks to give you time to prepare.  Write down the reasons why you are quitting. Keep this list in places where you will  see it often, such as on your bathroom mirror or in your car or wallet.  Identify the people, places, things, and activities that make you want to smoke (triggers) and avoid them. Make sure to take these actions:  Throw away all cigarettes at home, at work, and in your car.  Throw away smoking accessories, such as Scientist, research (medical).  Clean your car and make sure to empty the ashtray.  Clean your home, including curtains and carpets.  Tell your family, friends, and coworkers that you are quitting. Support from your loved ones can make quitting easier.  Talk with your health care provider about your options for quitting smoking.  Find out what treatment options are covered by your health insurance. What strategies can I use to quit smoking? Talk with your healthcare provider about different strategies to quit smoking. Some strategies include:  Quitting smoking altogether instead of gradually lessening how much you smoke over a period of time. Research shows that quitting "cold [place]" is more successful than gradually quitting.  Attending in-person counseling to help you build problem-solving skills. You are more likely to have success in quitting if you attend several counseling sessions. Even short sessions of 10 minutes can be effective.  Finding resources and support systems that can help you to quit smoking and remain smoke-free after you quit. These resources are most helpful when you use them often. They can include:  Online chats with a Social worker.  Telephone quitlines.  Printed Furniture conservator/restorer.  Support groups or group counseling.  Text messaging programs.  Mobile phone applications.  Taking medicines to help you quit smoking. (If you are pregnant or breastfeeding, talk with your health care provider first.) Some medicines contain nicotine and some do not. Both types of medicines help with cravings, but the medicines that include nicotine help to relieve withdrawal  symptoms. Your health care provider may recommend:  Nicotine patches, gum, or lozenges.  Nicotine inhalers or sprays.  Non-nicotine medicine that is taken by mouth. Talk with your health care provider about combining strategies, such as taking medicines while you are also receiving in-person counseling. Using these two strategies together makes you more likely to succeed in quitting than if you used either strategy on its own. If you are pregnant or breastfeeding, talk with your health care provider about finding counseling or other support strategies to quit smoking. Do not take medicine to help you quit smoking unless told to do so by your health care provider. What things can I do to make it easier to quit? Quitting smoking might feel overwhelming at first, but there is a lot that you can do to make it easier. Take these important actions:  Reach out to your family and friends and ask that they support and encourage you during this time. Call telephone quitlines, reach out to support groups, or work with a counselor for support.  Ask people who smoke to avoid smoking around you.  Avoid places that trigger you to smoke, such as bars, parties, or smoke-break areas at work.  Spend time around people who do not smoke.  Lessen stress in your life, because stress can be a smoking trigger for some people.  To lessen stress, try:  Exercising regularly.  Deep-breathing exercises.  Yoga.  Meditating.  Performing a body scan. This involves closing your eyes, scanning your body from head to toe, and noticing which parts of your body are particularly tense. Purposefully relax the muscles in those areas.  Download or purchase mobile phone or tablet apps (applications) that can help you stick to your quit plan by providing reminders, tips, and encouragement. There are many free apps, such as QuitGuide from the [employer] Office manager for Disease Control and Prevention). You can find other support for  quitting smoking (smoking cessation) through smokefree.gov and other websites. How will I feel when I quit smoking? Within the first 24 hours of quitting smoking, you may start to feel some withdrawal symptoms. These symptoms are usually most noticeable 2-3 days after quitting, but they usually do not last beyond 2-3 weeks. Changes or symptoms that you might experience include:  Mood swings.  Restlessness, anxiety, or irritation.  Difficulty concentrating.  Dizziness.  Strong cravings for sugary foods in addition to nicotine.  Mild weight gain.  Constipation.  Nausea.  Coughing or a sore throat.  Changes in how your medicines work in your body.  A depressed mood.  Difficulty sleeping (insomnia). After the first 2-3 weeks of quitting, you may start to notice more positive results, such as:  Improved sense of smell and taste.  Decreased coughing and sore throat.  Slower heart rate.  Lower blood pressure.  Clearer skin.  The ability to breathe more easily.  Fewer sick days. Quitting smoking is very challenging for most people. Do not get discouraged if you are not successful the first time. Some people need to make many attempts to quit before they achieve long-term success. Do your best to stick to your quit plan, and talk with your health care provider if you have any questions or concerns. This information is not intended to replace advice given to you by your health care provider. Make sure you discuss any questions you have with your health care provider. Document Released: 01/16/2001 Document Revised: 09/20/2015 Document Reviewed: 06/08/2014 Elsevier Interactive Patient Education  2017 [employer].   IF you received an x-[name] today, you will receive an invoice from [facility]. Please contact [facility] at [phone] with questions or concerns regarding your invoice.   IF you received labwork today, you will receive an invoice from [facility].  Please contact [facility] at [phone] with questions or concerns regarding your invoice.   Our billing staff will not be able to assist you with questions regarding bills from these companies.  You will be contacted with the lab results as soon as they are available. The fastest way to get your results is to activate your My Chart account. Instructions are located on the last page of this paperwork. If you have not heard from [place] regarding the results in 2 weeks, please contact this office.       I personally performed the services described in this documentation, which was scribed in my presence. The recorded information has been reviewed and considered for accuracy and completeness, addended by me as needed, and agree with information above.  Signed,   Seth Ray, MD [facility].  04/14/16 5:18 PM

## 2016-04-13 LAB — COMPREHENSIVE METABOLIC PANEL
A/G RATIO: 1.8 (ref 1.2–2.2)
ALK PHOS: 99 IU/L (ref 39–117)
ALT: 9 IU/L (ref 0–44)
AST: 12 IU/L (ref 0–40)
Albumin: 4.2 g/dL (ref 3.5–4.8)
BUN / CREAT RATIO: 13 (ref 10–24)
BUN: 13 mg/dL (ref 8–27)
Bilirubin Total: 0.5 mg/dL (ref 0.0–1.2)
CO2: 23 mmol/L (ref 18–29)
Calcium: 9.3 mg/dL (ref 8.6–10.2)
Chloride: 97 mmol/L (ref 96–106)
Creatinine, Ser: 1.04 mg/dL (ref 0.76–1.27)
GFR calc Af Amer: 84 mL/min/{1.73_m2} (ref >59)
GFR, EST NON AFRICAN AMERICAN: 72 mL/min/{1.73_m2} (ref >59)
GLOBULIN, TOTAL: 2.4 g/dL (ref 1.5–4.5)
Glucose: 96 mg/dL (ref 65–99)
POTASSIUM: 4.8 mmol/L (ref 3.5–5.2)
SODIUM: 139 mmol/L (ref 134–144)
Total Protein: 6.6 g/dL (ref 6.0–8.5)

## 2016-04-13 LAB — LIPID PANEL
CHOLESTEROL TOTAL: 143 mg/dL (ref 100–199)
Chol/HDL Ratio: 4.1 ratio units (ref 0.0–5.0)
HDL: 35 mg/dL — AB (ref >39)
LDL CALC: 87 mg/dL (ref 0–99)
TRIGLYCERIDES: 107 mg/dL (ref 0–149)
VLDL Cholesterol Cal: 21 mg/dL (ref 5–40)

## 2016-04-13 LAB — HEMOGLOBIN A1C
Est. average glucose Bld gHb Est-mCnc: 123 mg/dL
Hgb A1c MFr Bld: 5.9 % — ABNORMAL HIGH (ref 4.8–5.6)

## 2016-04-19 ENCOUNTER — Ambulatory Visit: Payer: Self-pay | Admitting: Family Medicine

## 2016-05-09 ENCOUNTER — Other Ambulatory Visit: Payer: Self-pay | Admitting: Family Medicine

## 2016-05-09 DIAGNOSIS — I10 Essential (primary) hypertension: Secondary | ICD-10-CM

## 2016-05-12 ENCOUNTER — Other Ambulatory Visit: Payer: Self-pay | Admitting: Family Medicine

## 2016-05-12 DIAGNOSIS — I10 Essential (primary) hypertension: Secondary | ICD-10-CM

## 2016-08-15 DIAGNOSIS — Z79899 Other long term (current) drug therapy: Secondary | ICD-10-CM | POA: Diagnosis not present

## 2016-08-15 DIAGNOSIS — B353 Tinea pedis: Secondary | ICD-10-CM | POA: Diagnosis not present

## 2016-09-17 DIAGNOSIS — Z79899 Other long term (current) drug therapy: Secondary | ICD-10-CM | POA: Diagnosis not present

## 2016-09-17 DIAGNOSIS — B353 Tinea pedis: Secondary | ICD-10-CM | POA: Diagnosis not present

## 2016-10-17 ENCOUNTER — Other Ambulatory Visit: Payer: Self-pay | Admitting: Family Medicine

## 2016-10-17 DIAGNOSIS — E785 Hyperlipidemia, unspecified: Secondary | ICD-10-CM

## 2016-10-17 DIAGNOSIS — I10 Essential (primary) hypertension: Secondary | ICD-10-CM

## 2016-10-22 ENCOUNTER — Encounter: Payer: Self-pay | Admitting: Family Medicine

## 2016-10-22 ENCOUNTER — Ambulatory Visit (INDEPENDENT_AMBULATORY_CARE_PROVIDER_SITE_OTHER): Payer: Medicare Other | Admitting: Family Medicine

## 2016-10-22 VITALS — BP 122/82 | HR 64 | Temp 97.9°F | Resp 16 | Ht 72.0 in | Wt 205.0 lb

## 2016-10-22 DIAGNOSIS — R7303 Prediabetes: Secondary | ICD-10-CM

## 2016-10-22 DIAGNOSIS — I10 Essential (primary) hypertension: Secondary | ICD-10-CM | POA: Diagnosis not present

## 2016-10-22 DIAGNOSIS — E785 Hyperlipidemia, unspecified: Secondary | ICD-10-CM | POA: Diagnosis not present

## 2016-10-22 DIAGNOSIS — Z87891 Personal history of nicotine dependence: Secondary | ICD-10-CM | POA: Diagnosis not present

## 2016-10-22 MED ORDER — LISINOPRIL 5 MG PO TABS: ORAL_TABLET | ORAL | 1 refills | Status: DC

## 2016-10-22 MED ORDER — METOPROLOL TARTRATE 50 MG PO TABS: 75.0000 mg | ORAL_TABLET | Freq: Two times a day (BID) | ORAL | 1 refills | Status: DC

## 2016-10-22 MED ORDER — ATORVASTATIN CALCIUM 20 MG PO TABS: 20.0000 mg | ORAL_TABLET | Freq: Every day | ORAL | 1 refills | Status: DC

## 2016-10-22 MED ORDER — LISINOPRIL 10 MG PO TABS: 10.0000 mg | ORAL_TABLET | Freq: Every day | ORAL | 1 refills | Status: DC

## 2016-10-22 NOTE — Progress Notes (Signed)
Subjective:  By signing my name below, I, Seth [name], attest that this documentation has been prepared under the direction and in the presence of Seth Agreste, MD Electronically Signed: Ladene Artist, ED Scribe 10/22/2016 at 8:57 AM.   Patient ID: Seth [name], male    DOB: 1945-08-26, 71 y.o.   MRN: [id number]  Chief Complaint  Patient presents with  . Medication Refill    lipitor, lisinopril, metoprolol   HPI  Seth [name] is a 71 y.o. male who presents to [place] at [facility] for medication refills. Pt is fasting at this visit.  HTN Takes Lisinopril 15 mg qd, metoprolol 75 mg bid and aspirin. Continue same dose as in March. Denies chest pain, sob, light-headedness, dizziness, HA, blood in stools, melena. Has noticed occasional fatigue in the evenings with taking his medications.  Hyperlipidemia Lab Results  Component Value Date   CHOL 143 04/12/2016   HDL 35 (L) 04/12/2016   LDLCALC 87 04/12/2016   TRIG 107 04/12/2016   CHOLHDL 4.1 04/12/2016    Lab Results  Component Value Date   ALT 9 04/12/2016   AST 12 04/12/2016   ALKPHOS 99 04/12/2016   BILITOT 0.5 04/12/2016  Takes Lipitor 20 mg qd.  Tobacco Abuse Discussed cessation. Pt states that he hasn't smoked since March. States he initially quit by chewing gum for ~2 weeks.   PVD/AAA Pt is followed by a vascular specialist. Next appointment is either in November or January.  Pre-Diabetes  Lab Results  Component Value Date   HGBA1C 5.9 (H) 04/12/2016  Diet changes included avoiding sugar containing beverages discussed in March. Today, pt states that he only occasionally has a soda.   Patient Active Problem List   Diagnosis Date Noted  . PVD (peripheral vascular disease) ([place]) 10/15/2013  . Tobacco use disorder 10/15/2013  . Pure hypercholesterolemia 10/15/2013  . AAA (abdominal aortic aneurysm) ([place]) 10/15/2013  . Coronary artery disease involving native coronary artery of native heart without angina  pectoris 10/15/2013  . Cataract 09/29/2013  . Colon cancer screening 09/25/2013  . AAA (abdominal aortic aneurysm) without rupture ([place]) 10/22/2012  . Nicotine addiction 10/17/2012  . Atherosclerosis of native arteries of the extremities with ulceration(440.23) 10/17/2012  . Carotid bruit 10/17/2012  . Other nonspecific abnormal cardiovascular system function study 04/10/2012  . History of sciatica   . Essential hypertension, benign   . Seasonal allergies   . HTN (hypertension) 04/04/2011  . Abdominal aortic aneurysm ([place]) 11/03/2010   Past Medical History:  Diagnosis Date  . AAA (abdominal aortic aneurysm) ([place])   . Allergic rhinitis   . Anxiety   . Back pain   . CAD (coronary artery disease)   . Essential hypertension, benign   . Gout   . History of sciatica   . Hyperlipidemia   . PVD (peripheral vascular disease) ([place])   . Seasonal allergies    Past Surgical History:  Procedure Laterality Date  . CARDIAC CATHETERIZATION     stent placement   Allergies  Allergen Reactions  . Biaxin [Clarithromycin] Shortness Of Breath  . Clarithromycin Shortness Of Breath  . Iodinated Diagnostic Agents Hives  . Cortisone Other (See Comments)    Turns red from breast up  . Iodine Hives    Dye from nuclear medicine test  . Prednisone    Prior to Admission medications   Medication Sig Start Date End Date Taking? Authorizing Provider  aspirin EC 81 MG tablet Take 81 mg by mouth daily.  Yes [provider]  atorvastatin (LIPITOR) 20 MG tablet Take 1 tablet (20 mg total) by mouth daily at 6 PM. 04/12/16  Yes Seth Agreste, MD  cetirizine (ZYRTEC) 10 MG tablet Take 10 mg by mouth daily.   Yes [provider]  clopidogrel (PLAVIX) 75 MG tablet Take 75 mg by mouth daily.   Yes [provider]  lisinopril (PRINIVIL,ZESTRIL) 10 MG tablet Take 1 tablet (10 mg total) by mouth daily. 04/12/16  Yes Seth Agreste, MD  lisinopril (PRINIVIL,ZESTRIL) 10 MG tablet TAKE  1 TABLET (10 MG TOTAL) BY MOUTH DAILY. 05/14/16  Yes Seth Agreste, MD  lisinopril (PRINIVIL,ZESTRIL) 5 MG tablet TAKE 1 TABLET (5 MG TOTAL) BY MOUTH DAILY. 04/12/16  Yes Seth Agreste, MD  metoprolol (LOPRESSOR) 50 MG tablet Take 1.5 tablets (75 mg total) by mouth 2 (two) times daily. 04/12/16  Yes Seth Agreste, MD  OVER THE COUNTER MEDICATION    Yes [provider]  ranitidine (ZANTAC) 75 MG tablet Take 75 mg by mouth 2 (two) times daily.   Yes [provider]  SALINE NA Place into the nose as needed.   Yes [provider]   Social History   Social History  . Marital status: Married    Spouse name: N/A  . Number of children: 1  . Years of education: N/A   Occupational History  . retired    Social History Main Topics  . Smoking status: Former Smoker    Packs/day: 3.00    Years: 50.00    Types: Cigarettes    Quit date: 04/05/2016  . Smokeless tobacco: Never Used  . Alcohol use No     Comment: sometimes mixed drinks or beers  . Drug use: No  . Sexual activity: Not on file   Other Topics Concern  . Not on file   Social History Narrative   Married   Exercise: No   Education: GED   Review of Systems  Constitutional: Positive for fatigue (occasional). Negative for unexpected weight change.  Eyes: Negative for visual disturbance.  Respiratory: Negative for cough, chest tightness and shortness of breath.   Cardiovascular: Negative for chest pain, palpitations and leg swelling.  Gastrointestinal: Negative for abdominal pain and blood in stool.  Neurological: Negative for dizziness, light-headedness and headaches.      Objective:   Physical Exam  Constitutional: He is oriented to person, place, and time. He appears well-developed and well-nourished.  HENT:  Head: Normocephalic and atraumatic.  Eyes: Pupils are equal, round, and reactive to light. EOM are normal.  Neck: No JVD present. Carotid bruit is not present.  Cardiovascular: Normal  rate, regular rhythm and normal heart sounds.   No murmur heard. Pulmonary/Chest: Effort normal and breath sounds normal. He has no rales.  Musculoskeletal: He exhibits no edema.  Neurological: He is alert and oriented to person, place, and time.  Skin: Skin is warm and dry.  Psychiatric: He has a normal mood and affect.  Vitals reviewed.  Vitals:   10/22/16 0832 10/22/16 0837  BP: (!) 158/85 122/82  Pulse: 64   Resp: 16   Temp: 97.9 F (36.6 C)   TempSrc: Oral   SpO2: 99%   Weight: 205 lb (93 kg)   Height: 6' (1.829 m)       Assessment & Plan:   Seth [name] is a 71 y.o. male  Essential hypertension - Plan: lisinopril (PRINIVIL,ZESTRIL) 10 MG tablet, lisinopril (PRINIVIL,ZESTRIL) 5 MG tablet, metoprolol tartrate (  LOPRESSOR) 50 MG tablet, Comprehensive metabolic panel  - Stable on recheck. Continue same dose of lisinopril 15 mg daily, metoprolol 75 mg twice a day.  -Episodic fatigue, may be related to beta blocker and lower readings. Advised to check blood pressure at home at various times, especially when feeling fatigued to decide if lower medication dose needed. If it does not appear to be blood pressure related, can look into other causes including possible sleep apnea testing as discussed previous visit.  History of tobacco abuse  - Commended on cessation.   Hyperlipidemia, unspecified hyperlipidemia type - Plan: atorvastatin (LIPITOR) 20 MG tablet, metoprolol tartrate (LOPRESSOR) 50 MG tablet, Comprehensive metabolic panel, Lipid panel  -Tolerating Lipitor same dose, continue Lipitor 20 mg daily, labs pending  Prediabetes - Plan: Hemoglobin A1c  -Watch diet, check A1c.   History of PVD/AAA,  -Routine follow-up with vascular specialist, asymptomatic currently.    Meds ordered this encounter  Medications  . atorvastatin (LIPITOR) 20 MG tablet    Sig: Take 1 tablet (20 mg total) by mouth daily at 6 PM.    Dispense:  90 tablet    Refill:  1  . lisinopril  (PRINIVIL,ZESTRIL) 10 MG tablet    Sig: Take 1 tablet (10 mg total) by mouth daily.    Dispense:  90 tablet    Refill:  1  . lisinopril (PRINIVIL,ZESTRIL) 5 MG tablet    Sig: TAKE 1 TABLET (5 MG TOTAL) BY MOUTH DAILY.    Dispense:  90 tablet    Refill:  1  . metoprolol tartrate (LOPRESSOR) 50 MG tablet    Sig: Take 1.5 tablets (75 mg total) by mouth 2 (two) times daily.    Dispense:  270 tablet    Refill:  1   Patient Instructions    Keep a record of your blood pressures outside of the office and can check at different times to make sure it is not dropping too low to cause fatigue.   Return to the clinic or go to the nearest emergency room if any of your symptoms worsen or new symptoms occur.  Recheck in 6 months.    IF you received an x-ray today, you will receive an invoice from [facility]. Please contact [facility] Rad[facility] at [phone] with questions or concerns regarding your invoice.   IF you received labwork today, you will receive an invoice from [place]. Please contact [facility] at [phone] with questions or concerns regarding your invoice.   Our billing staff will not be able to assist you with questions regarding bills from these companies.  You will be contacted with the lab results as soon as they are available. The fastest way to get your results is to activate your My Chart account. Instructions are located on the last page of this paperwork. If you have not heard from [place] regarding the results in 2 weeks, please contact this office.       I personally performed the services described in this documentation, which was scribed in my presence. The recorded information has been reviewed and considered for accuracy and completeness, addended by me as needed, and agree with information above.  Signed,   [name], MD [facility] at [place].  10/22/16 9:05 AM

## 2016-10-22 NOTE — Patient Instructions (Addendum)
  Keep a record of your blood pressures outside of the office and can check at different times to make sure it is not dropping too low to cause fatigue.   Return to the clinic or go to the nearest emergency room if any of your symptoms worsen or new symptoms occur.  Recheck in 6 months.    IF you received an x-ray today, you will receive an invoice from [facility]. Please contact [facility] Radiology at [phone] with questions or concerns regarding your invoice.   IF you received labwork today, you will receive an invoice from [place]. Please contact [facility] at [phone] with questions or concerns regarding your invoice.   Our billing staff will not be able to assist you with questions regarding bills from these companies.  You will be contacted with the lab results as soon as they are available. The fastest way to get your results is to activate your My Chart account. Instructions are located on the last page of this paperwork. If you have not heard from [place] regarding the results in 2 weeks, please contact this office.

## 2016-10-23 ENCOUNTER — Encounter: Payer: Self-pay | Admitting: Family Medicine

## 2016-10-23 ENCOUNTER — Ambulatory Visit (INDEPENDENT_AMBULATORY_CARE_PROVIDER_SITE_OTHER): Payer: Medicare Other | Admitting: Family Medicine

## 2016-10-23 VITALS — BP 122/84 | HR 88 | Temp 98.4°F | Resp 16 | Ht 72.0 in | Wt 205.6 lb

## 2016-10-23 DIAGNOSIS — B356 Tinea cruris: Secondary | ICD-10-CM

## 2016-10-23 LAB — LIPID PANEL
CHOL/HDL RATIO: 3.6 ratio (ref 0.0–5.0)
Cholesterol, Total: 140 mg/dL (ref 100–199)
HDL: 39 mg/dL — AB (ref >39)
LDL Calculated: 81 mg/dL (ref 0–99)
TRIGLYCERIDES: 98 mg/dL (ref 0–149)
VLDL Cholesterol Cal: 20 mg/dL (ref 5–40)

## 2016-10-23 LAB — COMPREHENSIVE METABOLIC PANEL
A/G RATIO: 1.6 (ref 1.2–2.2)
ALT: 15 IU/L (ref 0–44)
AST: 16 IU/L (ref 0–40)
Albumin: 4.2 g/dL (ref 3.5–4.8)
Alkaline Phosphatase: 91 IU/L (ref 39–117)
BUN/Creatinine Ratio: 9 — ABNORMAL LOW (ref 10–24)
BUN: 12 mg/dL (ref 8–27)
Bilirubin Total: 0.5 mg/dL (ref 0.0–1.2)
CALCIUM: 9.5 mg/dL (ref 8.6–10.2)
CHLORIDE: 100 mmol/L (ref 96–106)
CO2: 20 mmol/L (ref 20–29)
Creatinine, Ser: 1.41 mg/dL — ABNORMAL HIGH (ref 0.76–1.27)
GFR calc Af Amer: 58 mL/min/{1.73_m2} — ABNORMAL LOW (ref >59)
GFR, EST NON AFRICAN AMERICAN: 50 mL/min/{1.73_m2} — AB (ref >59)
GLOBULIN, TOTAL: 2.6 g/dL (ref 1.5–4.5)
Glucose: 101 mg/dL — ABNORMAL HIGH (ref 65–99)
POTASSIUM: 4.6 mmol/L (ref 3.5–5.2)
SODIUM: 137 mmol/L (ref 134–144)
Total Protein: 6.8 g/dL (ref 6.0–8.5)

## 2016-10-23 LAB — HEMOGLOBIN A1C
Est. average glucose Bld gHb Est-mCnc: 128 mg/dL
HEMOGLOBIN A1C: 6.1 % — AB (ref 4.8–5.6)

## 2016-10-23 MED ORDER — CLOTRIMAZOLE 1 % EX CREA: 1.0000 "application " | TOPICAL_CREAM | Freq: Two times a day (BID) | CUTANEOUS | 0 refills | Status: DC

## 2016-10-23 NOTE — Progress Notes (Signed)
Subjective:  By signing my name below, I, [name], attest that this documentation has been prepared under the direction and in the presence of [name], MD Electronically Signed: Ladene Artist, ED Scribe 10/23/2016 at 5:05 PM.   Patient ID: Seth [name], male    DOB: [birth year], 71 y.o.   MRN: [id number]  Chief Complaint  Patient presents with  . Rash    in groin are started yesterday   HPI  Seth [name] is a 71 y.o. male who presents to [facility] at [facility] complaining of a painful rash to the groin first noticed around 1 AM today. Pt applied combination antifungal steroid cream to the area with some improvement of pain. Denies dysuria, other rash, Athlete's Foot currently.   Patient Active Problem List   Diagnosis Date Noted  . PVD (peripheral vascular disease) ([place]) 10/15/2013  . Tobacco use disorder 10/15/2013  . Pure hypercholesterolemia 10/15/2013  . AAA (abdominal aortic aneurysm) ([place]) 10/15/2013  . Coronary artery disease involving native coronary artery of native heart without angina pectoris 10/15/2013  . Cataract 09/29/2013  . Colon cancer screening 09/25/2013  . AAA (abdominal aortic aneurysm) without rupture ([place]) 10/22/2012  . Nicotine addiction 10/17/2012  . Atherosclerosis of native arteries of the extremities with ulceration(440.23) 10/17/2012  . Carotid bruit 10/17/2012  . Other nonspecific abnormal cardiovascular system function study 04/10/2012  . History of sciatica   . Essential hypertension, benign   . Seasonal allergies   . HTN (hypertension) 04/04/2011  . Abdominal aortic aneurysm ([place]) 11/03/2010   Past Medical History:  Diagnosis Date  . AAA (abdominal aortic aneurysm) ([place])   . Allergic rhinitis   . Anxiety   . Back pain   . CAD (coronary artery disease)   . Essential hypertension, benign   . Gout   . History of sciatica   . Hyperlipidemia   . PVD (peripheral vascular disease) ([place])   . Seasonal allergies    Past Surgical  History:  Procedure Laterality Date  . CARDIAC CATHETERIZATION     stent placement   Allergies  Allergen Reactions  . Biaxin [Clarithromycin] Shortness Of Breath  . Clarithromycin Shortness Of Breath  . Iodinated Diagnostic Agents Hives  . Cortisone Other (See Comments)    Turns red from breast up  . Iodine Hives    Dye from nuclear medicine test  . Prednisone    Prior to Admission medications   Medication Sig Start Date End Date Taking? Authorizing Provider  aspirin EC 81 MG tablet Take 81 mg by mouth daily.    [provider]  atorvastatin (LIPITOR) 20 MG tablet Take 1 tablet (20 mg total) by mouth daily at 6 PM. 10/22/16   [name], MD  cetirizine (ZYRTEC) 10 MG tablet Take 10 mg by mouth daily.    [provider]  clopidogrel (PLAVIX) 75 MG tablet Take 75 mg by mouth daily.    [provider]  lisinopril (PRINIVIL,ZESTRIL) 10 MG tablet Take 1 tablet (10 mg total) by mouth daily. 10/22/16   [name], MD  lisinopril (PRINIVIL,ZESTRIL) 5 MG tablet TAKE 1 TABLET (5 MG TOTAL) BY MOUTH DAILY. 10/22/16   [name], MD  metoprolol tartrate (LOPRESSOR) 50 MG tablet Take 1.5 tablets (75 mg total) by mouth 2 (two) times daily. 10/22/16   [name], MD  OVER THE COUNTER MEDICATION     [provider]  ranitidine (ZANTAC) 75 MG tablet Take 75 mg by mouth 2 (two)  times daily.    [provider]  SALINE NA Place into the nose as needed.    [provider]   Social History   Social History  . Marital status: Married    Spouse name: N/A  . Number of children: 1  . Years of education: N/A   Occupational History  . retired    Social History Main Topics  . Smoking status: Former Smoker    Packs/day: 3.00    Years: 50.00    Types: Cigarettes    Quit date: 04/05/2016  . Smokeless tobacco: Never Used  . Alcohol use No     Comment: sometimes mixed drinks or beers  . Drug use: No  . Sexual activity: Not on  file   Other Topics Concern  . Not on file   Social History Narrative   Married   Exercise: No   Education: GED   Review of Systems  Genitourinary: Negative for dysuria.  Skin: Positive for rash.      Objective:   Physical Exam  Constitutional: He is oriented to person, place, and time. He appears well-developed and well-nourished. No distress.  HENT:  Head: Normocephalic and atraumatic.  Eyes: Conjunctivae and EOM are normal.  Neck: Neck supple. No tracheal deviation present.  Cardiovascular: Normal rate.   Pulmonary/Chest: Effort normal. No respiratory distress.  Genitourinary:  Genitourinary Comments: Erythema into the inguinal fold into lateral aspect of scrotum. Penis is uninvolved. No discharge. Possible few satellite lesions.  Musculoskeletal: Normal range of motion.  Neurological: He is alert and oriented to person, place, and time.  Skin: Skin is warm and dry.  Psychiatric: He has a normal mood and affect. His behavior is normal.  Nursing note and vitals reviewed.  Vitals:   10/23/16 1638  BP: 122/84  Pulse: 88  Resp: 16  Temp: 98.4 F (36.9 C)  SpO2: 98%  Weight: 205 lb 9.6 oz (93.3 kg)  Height: 6' (1.829 m)      Assessment & Plan:   Seth [name] is a 71 y.o. male Tinea cruris - Plan: clotrimazole (LOTRIMIN) 1 % cream  - Appears typical tinea cruris versus Candida. Minimal spread to lateral scrotum, but no penile involvement. He is not currently on immunosuppressive medication.  - Continue clotrimazole/betamethasone twice a day, until symptomatic improvement, then switch to clotrimazole twice a day. RTC precautions discussed.  Meds ordered this encounter  Medications  . clotrimazole (LOTRIMIN) 1 % cream    Sig: Apply 1 application topically 2 (two) times daily. To affected area on groin. Use for 2 weeks, or at least 2 days after resolution of rash    Dispense:  30 g    Refill:  0   Patient Instructions    Rash appears to be tinea cruris or jock  itch. Okay to use combination steroid/antifungal cream as long as it has clotrimazole (antifungal) twice per day until area starts to improve. Then switch to clotrimazole by itself twice per day until rash resolves and use for an additional 2 days. Allow air to circulate to that area including using boxers or leave the area open to air as much as possible. If you see any spread of redness to the penis or continued spread of rash, please return for recheck.   Return to the clinic or go to the nearest emergency room if any of your symptoms worsen or new symptoms occur.  Jock Itch Jock itch (tinea cruris) is a fungal infection of the skin in the groin  area. It is sometimes called ringworm, even though it is not caused by worms. It is caused by a fungus, which is a type of germ that thrives in dark, damp places. Jock itch causes a rash and itching in the groin and upper thigh area. It usually goes away in 2-3 weeks with treatment. What are the causes? The fungus that causes jock itch may be spread by:  Touching a fungus infection elsewhere on your body-such as athlete's foot-and then touching your groin area.  Sharing towels or clothing with an infected person.  What increases the risk? Jock itch is most common in men and adolescent boys. This condition is more likely to develop from:  Being in hot, humid climates.  Wearing tight-fitting clothing or wet bathing suits for long periods of time.  Participating in sports.  Being overweight.  Having diabetes.  What are the signs or symptoms? Symptoms of jock itch may include:  A red, pink, or brown rash in the groin area. The rash may spread to the thighs, anus, and buttocks.  Dry and scaly skin on or around the rash.  Itchiness.  How is this diagnosed? Most often, a health care provider can make the diagnosis by looking at your rash. Sometimes, a scraping of the infected skin will be taken. This sample may be tested by looking at it under  a microscope or by trying to grow the fungus from the sample (culture). How is this treated? Treatment for this condition may include:  Antifungal medicine to kill the fungus. This may be in various forms: ? Skin cream or ointment. ? Medicine taken by mouth.  Skin cream or ointment to reduce the itching.  Compresses or medicated powders to dry the infected skin.  Follow these instructions at home:  Take medicines only as directed by your health care provider. Apply skin creams or ointments exactly as directed.  Wear loose-fitting clothing. ? Men should wear cotton boxer shorts. ? Women should wear cotton underwear.  Change your underwear every day to keep your groin dry.  Avoid hot baths.  Dry your groin area well after bathing. ? Use a separate towel to dry your groin area. This will help to prevent a spreading of the infection to other areas of your body.  Do not scratch the affected area.  Do not share towels with other people. Contact a health care provider if:  Your rash does not improve or it gets worse after 2 weeks of treatment.  Your rash is spreading.  Your rash returns after treatment is finished.  You have a fever.  You have redness, swelling, or pain in the area around your rash.  You have fluid, blood, or pus coming from your rash.  Your have your rash for more than 4 weeks. This information is not intended to replace advice given to you by your health care provider. Make sure you discuss any questions you have with your health care provider. Document Released: 01/12/2002 Document Revised: 06/30/2015 Document Reviewed: 11/03/2013 Elsevier Interactive Patient Education  2018 [employer].    IF you received an x-ray today, you will receive an invoice from [facility]. Please contact [facility] Radiology at [phone] with questions or concerns regarding your invoice.   IF you received labwork today, you will receive an invoice from  [place]. Please contact [facility] at [phone] with questions or concerns regarding your invoice.   Our billing staff will not be able to assist you with questions regarding bills from these companies.  You will be contacted with the lab results as soon as they are available. The fastest way to get your results is to activate your My Chart account. Instructions are located on the last page of this paperwork. If you have not heard from [place] regarding the results in 2 weeks, please contact this office.       I personally performed the services described in this documentation, which was scribed in my presence. The recorded information has been reviewed and considered for accuracy and completeness, addended by me as needed, and agree with information above.  Signed,   [name], MD [facility] at [place].  10/23/16 6:36 PM

## 2016-10-23 NOTE — Patient Instructions (Addendum)
Rash appears to be tinea cruris or jock itch. Okay to use combination steroid/antifungal cream as long as it has clotrimazole (antifungal) twice per day until area starts to improve. Then switch to clotrimazole by itself twice per day until rash resolves and use for an additional 2 days. Allow air to circulate to that area including using boxers or leave the area open to air as much as possible. If you see any spread of redness to the penis or continued spread of rash, please return for recheck.   Return to the clinic or go to the nearest emergency room if any of your symptoms worsen or new symptoms occur.  Jock Itch Jock itch (tinea cruris) is a fungal infection of the skin in the groin area. It is sometimes called ringworm, even though it is not caused by worms. It is caused by a fungus, which is a type of germ that thrives in dark, damp places. Jock itch causes a rash and itching in the groin and upper thigh area. It usually goes away in 2-3 weeks with treatment. What are the causes? The fungus that causes jock itch may be spread by:  Touching a fungus infection elsewhere on your body-such as athlete's foot-and then touching your groin area.  Sharing towels or clothing with an infected person.  What increases the risk? Jock itch is most common in men and adolescent boys. This condition is more likely to develop from:  Being in hot, humid climates.  Wearing tight-fitting clothing or wet bathing suits for long periods of time.  Participating in sports.  Being overweight.  Having diabetes.  What are the signs or symptoms? Symptoms of jock itch may include:  A red, pink, or brown rash in the groin area. The rash may spread to the thighs, anus, and buttocks.  Dry and scaly skin on or around the rash.  Itchiness.  How is this diagnosed? Most often, a health care provider can make the diagnosis by looking at your rash. Sometimes, a scraping of the infected skin will be taken. This  sample may be tested by looking at it under a microscope or by trying to grow the fungus from the sample (culture). How is this treated? Treatment for this condition may include:  Antifungal medicine to kill the fungus. This may be in various forms: ? Skin cream or ointment. ? Medicine taken by mouth.  Skin cream or ointment to reduce the itching.  Compresses or medicated powders to dry the infected skin.  Follow these instructions at home:  Take medicines only as directed by your health care provider. Apply skin creams or ointments exactly as directed.  Wear loose-fitting clothing. ? Men should wear cotton boxer shorts. ? Women should wear cotton underwear.  Change your underwear every day to keep your groin dry.  Avoid hot baths.  Dry your groin area well after bathing. ? Use a separate towel to dry your groin area. This will help to prevent a spreading of the infection to other areas of your body.  Do not scratch the affected area.  Do not share towels with other people. Contact a health care provider if:  Your rash does not improve or it gets worse after 2 weeks of treatment.  Your rash is spreading.  Your rash returns after treatment is finished.  You have a fever.  You have redness, swelling, or pain in the area around your rash.  You have fluid, blood, or pus coming from your rash.  Your have  your rash for more than 4 weeks. This information is not intended to replace advice given to you by your health care provider. Make sure you discuss any questions you have with your health care provider. Document Released: 01/12/2002 Document Revised: 06/30/2015 Document Reviewed: 11/03/2013 Elsevier Interactive Patient Education  2018 [employer].    IF you received an x-ray today, you will receive an invoice from [facility]. Please contact [facility] at [phone] with questions or concerns regarding your invoice.   IF you received labwork  today, you will receive an invoice from [facility]. Please contact [facility] at [phone] with questions or concerns regarding your invoice.   Our billing staff will not be able to assist you with questions regarding bills from these companies.  You will be contacted with the lab results as soon as they are available. The fastest way to get your results is to activate your My Chart account. Instructions are located on the last page of this paperwork. If you have not heard from [place] regarding the results in 2 weeks, please contact this office.

## 2016-10-31 ENCOUNTER — Other Ambulatory Visit: Payer: Self-pay | Admitting: Family Medicine

## 2016-10-31 DIAGNOSIS — R7989 Other specified abnormal findings of blood chemistry: Secondary | ICD-10-CM

## 2016-10-31 NOTE — Progress Notes (Signed)
Elevated creatinine. Lab only order placed

## 2016-11-08 ENCOUNTER — Encounter: Payer: Self-pay | Admitting: *Deleted

## 2016-11-08 ENCOUNTER — Telehealth: Payer: Self-pay | Admitting: *Deleted

## 2016-11-08 NOTE — Telephone Encounter (Signed)
Pt advised of lab results and to come into the office in 1 week  to have labs done.

## 2016-11-12 ENCOUNTER — Encounter: Payer: Self-pay | Admitting: Internal Medicine

## 2016-11-15 ENCOUNTER — Ambulatory Visit (INDEPENDENT_AMBULATORY_CARE_PROVIDER_SITE_OTHER): Payer: Medicare Other | Admitting: Family Medicine

## 2016-11-15 DIAGNOSIS — R7989 Other specified abnormal findings of blood chemistry: Secondary | ICD-10-CM

## 2016-11-16 LAB — BASIC METABOLIC PANEL
BUN/Creatinine Ratio: 9 — ABNORMAL LOW (ref 10–24)
BUN: 12 mg/dL (ref 8–27)
CALCIUM: 9.2 mg/dL (ref 8.6–10.2)
CO2: 20 mmol/L (ref 20–29)
CREATININE: 1.4 mg/dL — AB (ref 0.76–1.27)
Chloride: 100 mmol/L (ref 96–106)
GFR calc Af Amer: 58 mL/min/{1.73_m2} — ABNORMAL LOW (ref >59)
GFR, EST NON AFRICAN AMERICAN: 50 mL/min/{1.73_m2} — AB (ref >59)
GLUCOSE: 96 mg/dL (ref 65–99)
Potassium: 4.5 mmol/L (ref 3.5–5.2)
SODIUM: 138 mmol/L (ref 134–144)

## 2016-11-17 NOTE — Progress Notes (Signed)
Not seen by provider

## 2016-12-04 DIAGNOSIS — J300 Vasomotor rhinitis: Secondary | ICD-10-CM | POA: Diagnosis not present

## 2016-12-04 DIAGNOSIS — L509 Urticaria, unspecified: Secondary | ICD-10-CM | POA: Diagnosis not present

## 2016-12-17 DIAGNOSIS — I739 Peripheral vascular disease, unspecified: Secondary | ICD-10-CM | POA: Diagnosis not present

## 2016-12-17 DIAGNOSIS — I714 Abdominal aortic aneurysm, without rupture: Secondary | ICD-10-CM | POA: Diagnosis not present

## 2016-12-17 DIAGNOSIS — I6523 Occlusion and stenosis of bilateral carotid arteries: Secondary | ICD-10-CM | POA: Diagnosis not present

## 2016-12-17 DIAGNOSIS — I723 Aneurysm of iliac artery: Secondary | ICD-10-CM | POA: Diagnosis not present

## 2016-12-31 DIAGNOSIS — I6523 Occlusion and stenosis of bilateral carotid arteries: Secondary | ICD-10-CM | POA: Diagnosis not present

## 2016-12-31 DIAGNOSIS — G458 Other transient cerebral ischemic attacks and related syndromes: Secondary | ICD-10-CM | POA: Diagnosis not present

## 2016-12-31 DIAGNOSIS — I723 Aneurysm of iliac artery: Secondary | ICD-10-CM | POA: Diagnosis not present

## 2016-12-31 DIAGNOSIS — I70213 Atherosclerosis of native arteries of extremities with intermittent claudication, bilateral legs: Secondary | ICD-10-CM | POA: Diagnosis not present

## 2016-12-31 DIAGNOSIS — E7800 Pure hypercholesterolemia, unspecified: Secondary | ICD-10-CM | POA: Diagnosis not present

## 2016-12-31 DIAGNOSIS — I10 Essential (primary) hypertension: Secondary | ICD-10-CM | POA: Diagnosis not present

## 2016-12-31 DIAGNOSIS — Z7902 Long term (current) use of antithrombotics/antiplatelets: Secondary | ICD-10-CM | POA: Diagnosis not present

## 2016-12-31 DIAGNOSIS — I739 Peripheral vascular disease, unspecified: Secondary | ICD-10-CM | POA: Diagnosis not present

## 2016-12-31 DIAGNOSIS — Z955 Presence of coronary angioplasty implant and graft: Secondary | ICD-10-CM | POA: Diagnosis not present

## 2016-12-31 DIAGNOSIS — I714 Abdominal aortic aneurysm, without rupture: Secondary | ICD-10-CM | POA: Diagnosis not present

## 2016-12-31 DIAGNOSIS — Z87891 Personal history of nicotine dependence: Secondary | ICD-10-CM | POA: Diagnosis not present

## 2017-02-07 ENCOUNTER — Other Ambulatory Visit: Payer: Self-pay | Admitting: Family Medicine

## 2017-02-07 DIAGNOSIS — B356 Tinea cruris: Secondary | ICD-10-CM

## 2017-02-08 NOTE — Telephone Encounter (Signed)
See attached refill for Seth [name].  Would he like this refilled?

## 2017-03-19 DIAGNOSIS — H2513 Age-related nuclear cataract, bilateral: Secondary | ICD-10-CM | POA: Diagnosis not present

## 2017-03-19 DIAGNOSIS — H52203 Unspecified astigmatism, bilateral: Secondary | ICD-10-CM | POA: Diagnosis not present

## 2017-04-16 ENCOUNTER — Ambulatory Visit: Payer: Medicare Other

## 2017-04-16 VITALS — BP 122/78 | HR 70 | Ht 72.0 in | Wt 212.1 lb

## 2017-04-16 DIAGNOSIS — Z0000 Encounter for general adult medical examination without abnormal findings: Secondary | ICD-10-CM

## 2017-04-16 DIAGNOSIS — Z1211 Encounter for screening for malignant neoplasm of colon: Secondary | ICD-10-CM

## 2017-04-16 NOTE — Patient Instructions (Addendum)
Seth [name] , Thank you for taking time to come for your Medicare Wellness Visit. I appreciate your ongoing commitment to your health goals. Please review the following plan we discussed and let me know if I can assist you in the future.   Screening recommendations/referrals: Colonoscopy:  Seth [name] will contact you to have this set up   Recommended yearly ophthalmology/optometry visit for glaucoma screening and checkup Recommended yearly dental visit for hygiene and checkup  Vaccinations: Influenza vaccine: declined Pneumococcal vaccine: declined Tdap vaccine: declined due to insurance Shingles vaccine: declined  Advanced directives: Please bring a copy of your POA (Power of [place]) and/or Living Will to your next appointment.   Conditions/risks identified: Try to start back walking more on a daily basis  Next appointment: 04/23/2017 @ 8 am with Dr. Carlota Raspberry, 1 year for next AWV  Preventive Care 7 Years and Older, Male Preventive care refers to lifestyle choices and visits with your health care provider that can promote health and wellness. What does preventive care include?  A yearly physical exam. This is also called an annual well check.  Dental exams once or twice a year.  Routine eye exams. Ask your health care provider how often you should have your eyes checked.  Personal lifestyle choices, including:  Daily care of your teeth and gums.  Regular physical activity.  Eating a healthy diet.  Avoiding tobacco and drug use.  Limiting alcohol use.  Practicing safe sex.  Taking low doses of aspirin every day.  Taking vitamin and mineral supplements as recommended by your health care provider. What happens during an annual well check? The services and screenings done by your health care provider during your annual well check will depend on your age, overall health, lifestyle risk factors, and family history of disease. Counseling  Your health care provider may ask you  questions about your:  Alcohol use.  Tobacco use.  Drug use.  Emotional well-being.  Home and relationship well-being.  Sexual activity.  Eating habits.  History of falls.  Memory and ability to understand (cognition).  Work and work Statistician. Screening  You may have the following tests or measurements:  Height, weight, and BMI.  Blood pressure.  Lipid and cholesterol levels. These may be checked every 5 years, or more frequently if you are over 3 years old.  Skin check.  Lung cancer screening. You may have this screening every year starting at age 73 if you have a 30-pack-year history of smoking and currently smoke or have quit within the past 15 years.  Fecal occult blood test (FOBT) of the stool. You may have this test every year starting at age 51.  Flexible sigmoidoscopy or colonoscopy. You may have a sigmoidoscopy every 5 years or a colonoscopy every 10 years starting at age 5.  Prostate cancer screening. Recommendations will vary depending on your family history and other risks.  Hepatitis C blood test.  Hepatitis B blood test.  Sexually transmitted disease (STD) testing.  Diabetes screening. This is done by checking your blood sugar (glucose) after you have not eaten for a while (fasting). You may have this done every 1-3 years.  Abdominal aortic aneurysm (AAA) screening. You may need this if you are a current or former smoker.  Osteoporosis. You may be screened starting at age 24 if you are at high risk. Talk with your health care provider about your test results, treatment options, and if necessary, the need for more tests. Vaccines  Your health care provider may  recommend certain vaccines, such as:  Influenza vaccine. This is recommended every year.  Tetanus, diphtheria, and acellular pertussis (Tdap, Td) vaccine. You may need a Td booster every 10 years.  Zoster vaccine. You may need this after age 26.  Pneumococcal 13-valent conjugate  (PCV13) vaccine. One dose is recommended after age 59.  Pneumococcal polysaccharide (PPSV23) vaccine. One dose is recommended after age 61. Talk to your health care provider about which screenings and vaccines you need and how often you need them. This information is not intended to replace advice given to you by your health care provider. Make sure you discuss any questions you have with your health care provider. Document Released: 02/18/2015 Document Revised: 10/12/2015 Document Reviewed: 11/23/2014 Elsevier Interactive Patient Education  2017 [place] Prevention in the Home Falls can cause injuries. They can happen to people of all ages. There are many things you can do to make your home safe and to help prevent falls. What can I do on the outside of my home?  Regularly fix the edges of walkways and driveways and fix any cracks.  Remove anything that might make you trip as you walk through a door, such as a raised step or threshold.  Trim any bushes or trees on the path to your home.  Use bright outdoor lighting.  Clear any walking paths of anything that might make someone trip, such as rocks or tools.  Regularly check to see if handrails are loose or broken. Make sure that both sides of any steps have handrails.  Any raised decks and porches should have guardrails on the edges.  Have any leaves, snow, or ice cleared regularly.  Use sand or salt on walking paths during winter.  Clean up any spills in your garage right away. This includes oil or grease spills. What can I do in the bathroom?  Use night lights.  Install grab bars by the toilet and in the tub and shower. Do not use towel bars as grab bars.  Use non-skid mats or decals in the tub or shower.  If you need to sit down in the shower, use a plastic, non-slip stool.  Keep the floor dry. Clean up any water that spills on the floor as soon as it happens.  Remove soap buildup in the tub or shower  regularly.  Attach bath mats securely with double-sided non-slip rug tape.  Do not have throw rugs and other things on the floor that can make you trip. What can I do in the bedroom?  Use night lights.  Make sure that you have a light by your bed that is easy to reach.  Do not use any sheets or blankets that are too big for your bed. They should not hang down onto the floor.  Have a firm chair that has side arms. You can use this for support while you get dressed.  Do not have throw rugs and other things on the floor that can make you trip. What can I do in the kitchen?  Clean up any spills right away.  Avoid walking on wet floors.  Keep items that you use a lot in easy-to-reach places.  If you need to reach something above you, use a strong step stool that has a grab bar.  Keep electrical cords out of the way.  Do not use floor polish or wax that makes floors slippery. If you must use wax, use non-skid floor wax.  Do not have throw rugs and  other things on the floor that can make you trip. What can I do with my stairs?  Do not leave any items on the stairs.  Make sure that there are handrails on both sides of the stairs and use them. Fix handrails that are broken or loose. Make sure that handrails are as long as the stairways.  Check any carpeting to make sure that it is firmly attached to the stairs. Fix any carpet that is loose or worn.  Avoid having throw rugs at the top or bottom of the stairs. If you do have throw rugs, attach them to the floor with carpet tape.  Make sure that you have a light switch at the top of the stairs and the bottom of the stairs. If you do not have them, ask someone to add them for you. What else can I do to help prevent falls?  Wear shoes that:  Do not have high heels.  Have rubber bottoms.  Are comfortable and fit you well.  Are closed at the toe. Do not wear sandals.  If you use a stepladder:  Make sure that it is fully  opened. Do not climb a closed stepladder.  Make sure that both sides of the stepladder are locked into place.  Ask someone to hold it for you, if possible.  Clearly mark and make sure that you can see:  Any grab bars or handrails.  First and last steps.  Where the edge of each step is.  Use tools that help you move around (mobility aids) if they are needed. These include:  Canes.  Walkers.  Scooters.  Crutches.  Turn on the lights when you go into a dark area. Replace any light bulbs as soon as they burn out.  Set up your furniture so you have a clear path. Avoid moving your furniture around.  If any of your floors are uneven, fix them.  If there are any pets around you, be aware of where they are.  Review your medicines with your doctor. Some medicines can make you feel dizzy. This can increase your chance of falling. Ask your doctor what other things that you can do to help prevent falls. This information is not intended to replace advice given to you by your health care provider. Make sure you discuss any questions you have with your health care provider. Document Released: 11/18/2008 Document Revised: 06/30/2015 Document Reviewed: 02/26/2014 Elsevier Interactive Patient Education  2017 [employer].

## 2017-04-16 NOTE — Progress Notes (Signed)
Subjective:   Rishaan Gunner is a 72 y.o. male who presents for Medicare Annual/Subsequent preventive examination.  Review of Systems:  N/A Cardiac Risk Factors include: advanced age (>23men, >33 women);dyslipidemia;hypertension;male gender     Objective:    Vitals: BP 122/78   Pulse 70   Ht 6' (1.829 m)   Wt 212 lb 2 oz (96.2 kg)   SpO2 95%   BMI 28.77 kg/m   Body mass index is 28.77 kg/m.  Advanced Directives 04/16/2017 10/22/2016 04/12/2016  Does Patient Have a Medical Advance Directive? Yes No No  Type of Paramedic of [place];Living will - -  Copy of [place] in Chart? No - copy requested - -  Would patient like information on creating a medical advance directive? - No - Patient declined -    Tobacco Social History   Tobacco Use  Smoking Status Former Smoker  . Packs/day: 3.00  . Years: 50.00  . Pack years: 150.00  . Types: Cigarettes  . Last attempt to quit: 04/05/2016  . Years since quitting: 1.0  Smokeless Tobacco Never Used     Counseling given: Not Answered   Clinical Intake:  Pre-visit preparation completed: Yes  Pain : No/denies pain     Nutritional Status: BMI 25 -29 Overweight Nutritional Risks: None Diabetes: No  How often do you need to have someone help you when you read instructions, pamphlets, or other written materials from your doctor or pharmacy?: 1 - Never What is the last grade level you completed in school?: college  Interpreter Needed?: No  Information entered by :: Andrez Grime, LPN  Past Medical History:  Diagnosis Date  . AAA (abdominal aortic aneurysm) ([place])   . Allergic rhinitis   . Anxiety   . Back pain   . CAD (coronary artery disease)   . Essential hypertension, benign   . Gout   . History of sciatica   . Hyperlipidemia   . PVD (peripheral vascular disease) ([place])   . Seasonal allergies    Past Surgical History:  Procedure Laterality Date  . CARDIAC CATHETERIZATION      stent placement   Family History  Problem Relation Age of Onset  . Hypertension Mother   . Heart disease Mother   . Stroke Mother   . Heart disease Paternal Grandmother   . Diabetes Paternal Grandmother   . Hyperlipidemia Paternal Grandmother   . Diabetes Sister   . Heart disease Sister   . Hyperlipidemia Sister   . Heart disease Brother   . Hyperlipidemia Brother   . Hypertension Brother   . Diabetes Paternal Grandfather   . Heart disease Paternal Grandfather   . Hyperlipidemia Paternal Grandfather   . Heart disease Maternal Uncle        x 2  . Cancer Maternal Grandmother        type unknown, ? lung  . Leukemia Maternal Uncle    Social History   Socioeconomic History  . Marital status: Married    Spouse name: None  . Number of children: 1  . Years of education: None  . Highest education level: None  Social Needs  . Financial resource strain: Not hard at all  . Food insecurity - worry: Never true  . Food insecurity - inability: Never true  . Transportation needs - medical: No  . Transportation needs - non-medical: No  Occupational History  . Occupation: retired  Tobacco Use  . Smoking status: Former Smoker  Packs/day: 3.00    Years: 50.00    Pack years: 150.00    Types: Cigarettes    Last attempt to quit: 04/05/2016    Years since quitting: 1.0  . Smokeless tobacco: Never Used  Substance and Sexual Activity  . Alcohol use: No    Alcohol/week: 0.0 oz    Comment: sometimes mixed drinks or beers  . Drug use: No  . Sexual activity: None  Other Topics Concern  . None  Social History Narrative   Married   Exercise: No   Education: GED    Outpatient Encounter Medications as of 04/16/2017  Medication Sig  . aspirin EC 81 MG tablet Take 81 mg by mouth daily.  Marland Kitchen atorvastatin (LIPITOR) 20 MG tablet Take 1 tablet (20 mg total) by mouth daily at 6 PM.  . cetirizine (ZYRTEC) 10 MG tablet Take 10 mg by mouth daily.  . clopidogrel (PLAVIX) 75 MG tablet Take 75  mg by mouth daily.  . clotrimazole (LOTRIMIN) 1 % cream Apply 1 application topically 2 (two) times daily. To affected area on groin. Use for 2 weeks, or at least 2 days after resolution of rash  . lisinopril (PRINIVIL,ZESTRIL) 10 MG tablet Take 1 tablet (10 mg total) by mouth daily.  Marland Kitchen lisinopril (PRINIVIL,ZESTRIL) 5 MG tablet TAKE 1 TABLET (5 MG TOTAL) BY MOUTH DAILY.  . metoprolol tartrate (LOPRESSOR) 50 MG tablet Take 1.5 tablets (75 mg total) by mouth 2 (two) times daily.  Marland Kitchen OVER THE COUNTER MEDICATION   . ranitidine (ZANTAC) 75 MG tablet Take 75 mg by mouth 2 (two) times daily.  Marland Kitchen SALINE NA Place into the nose as needed.   No facility-administered encounter medications on file as of 04/16/2017.     Activities of Daily Living In your present state of health, do you have any difficulty performing the following activities: 04/16/2017  Hearing? N  Vision? N  Difficulty concentrating or making decisions? N  Walking or climbing stairs? N  Dressing or bathing? N  Doing errands, shopping? N  Preparing Food and eating ? N  Using the Toilet? N  In the past six months, have you accidently leaked urine? Y  Comment Patient states that he has a hard time urinating sometimes   Do you have problems with loss of bowel control? N  Managing your Medications? N  Managing your Finances? N  Housekeeping or managing your Housekeeping? N  Some recent data might be hidden    Patient Care Team: [name], MD as PCP - General (Family Medicine) Mosetta Anis, MD as Referring Physician (Allergy)   Assessment:   This is a routine wellness examination for [place].  Exercise Activities and Dietary recommendations Current Exercise Habits: The patient does not participate in regular exercise at present, Exercise limited by: None identified  Goals    . Exercise 3x per week (30 min per time)     Patient states that he wants to try to start back walking more on a daily basis.        Fall  Risk Fall Risk  04/16/2017 10/23/2016 10/22/2016 04/12/2016 09/22/2015  Falls in the past year? No No No No No   Is the patient's home free of loose throw rugs in walkways, pet beds, electrical cords, etc?   yes      Grab bars in the bathroom? no      Handrails on the stairs?   yes      Adequate lighting?   yes  Timed  Get Up and Go Performed: yes, completed within 30 seconds   Depression Screen PHQ 2/9 Scores 04/16/2017 10/23/2016 10/22/2016 04/12/2016  PHQ - 2 Score 0 0 0 0    Cognitive Function     6CIT Screen 04/16/2017  What Year? 0 points  What month? 0 points  What time? 0 points  Count back from 20 0 points  Months in reverse 0 points  Repeat phrase 0 points  Total Score 0    Immunization History  Administered Date(s) Administered  . Hepatitis B 02/06/2000  . Td 02/05/2005    Qualifies for Shingles Vaccine? Patient declined  Screening Tests Health Maintenance  Topic Date Due  . Hepatitis C Screening  07-Nov-1945  . COLONOSCOPY  10/20/2016  . INFLUENZA VACCINE  12/17/2017 (Originally 09/05/2016)  . TETANUS/TDAP  04/17/2018 (Originally 02/06/2015)  . PNA vac Low Risk Adult (1 of 2 - PCV13) 04/17/2018 (Originally 04/26/2010)   Cancer Screenings: Lung: Low Dose CT Chest recommended if Age 100-80 years, 30 pack-year currently smoking OR have quit w/in 15years. Patient does qualify. Patient declined lung cancer screening at this time. Colorectal: Referral sent to [facility] for repeat colonoscopy  Additional Screenings:  Hepatitis B/HIV/Syphillis: not indicated  Hepatitis C Screening: Patient will have blood work completed at his visit on 04/23/17 when he is fasting.    Patient states that he has a rash on his arms that has been worsening over the past 6 months. Has used cortisone in the past. Advised patient to follow up with Dr. Carlota Raspberry at his office visit on 04/23/17 but if symptoms worsen please call and schedule office visit.  Plan:   I have personally reviewed and noted the  following in the patient's chart:   . Medical and social history . Use of alcohol, tobacco or illicit drugs  . Current medications and supplements . Functional ability and status . Nutritional status . Physical activity . Advanced directives . List of other physicians . Hospitalizations, surgeries, and ER visits in previous 12 months . Vitals . Screenings to include cognitive, depression, and falls . Referrals and appointments  In addition, I have reviewed and discussed with patient certain preventive protocols, quality metrics, and best practice recommendations. A written personalized care plan for preventive services as well as general preventive health recommendations were provided to patient.   1. Encounter for screening colonoscopy - Ambulatory referral to Gastroenterology  2. Encounter for Medicare annual wellness exam  Andrez Grime, LPN  7/34/[id number]

## 2017-04-23 ENCOUNTER — Encounter: Payer: Self-pay | Admitting: Family Medicine

## 2017-04-23 ENCOUNTER — Ambulatory Visit (INDEPENDENT_AMBULATORY_CARE_PROVIDER_SITE_OTHER): Payer: Medicare Other | Admitting: Family Medicine

## 2017-04-23 ENCOUNTER — Other Ambulatory Visit: Payer: Self-pay

## 2017-04-23 VITALS — BP 118/70 | HR 69 | Temp 98.0°F | Resp 16 | Ht 72.0 in | Wt 212.0 lb

## 2017-04-23 DIAGNOSIS — R7303 Prediabetes: Secondary | ICD-10-CM | POA: Diagnosis not present

## 2017-04-23 DIAGNOSIS — Z1159 Encounter for screening for other viral diseases: Secondary | ICD-10-CM | POA: Diagnosis not present

## 2017-04-23 DIAGNOSIS — I10 Essential (primary) hypertension: Secondary | ICD-10-CM

## 2017-04-23 DIAGNOSIS — I739 Peripheral vascular disease, unspecified: Secondary | ICD-10-CM

## 2017-04-23 DIAGNOSIS — I714 Abdominal aortic aneurysm, without rupture, unspecified: Secondary | ICD-10-CM

## 2017-04-23 DIAGNOSIS — R3911 Hesitancy of micturition: Secondary | ICD-10-CM | POA: Diagnosis not present

## 2017-04-23 DIAGNOSIS — R350 Frequency of micturition: Secondary | ICD-10-CM | POA: Diagnosis not present

## 2017-04-23 DIAGNOSIS — E785 Hyperlipidemia, unspecified: Secondary | ICD-10-CM

## 2017-04-23 DIAGNOSIS — Z125 Encounter for screening for malignant neoplasm of prostate: Secondary | ICD-10-CM

## 2017-04-23 DIAGNOSIS — Z0000 Encounter for general adult medical examination without abnormal findings: Secondary | ICD-10-CM | POA: Diagnosis not present

## 2017-04-23 DIAGNOSIS — E7800 Pure hypercholesterolemia, unspecified: Secondary | ICD-10-CM | POA: Diagnosis not present

## 2017-04-23 MED ORDER — METOPROLOL TARTRATE 50 MG PO TABS: 75.0000 mg | ORAL_TABLET | Freq: Two times a day (BID) | ORAL | 1 refills | Status: DC

## 2017-04-23 MED ORDER — LISINOPRIL 5 MG PO TABS: ORAL_TABLET | ORAL | 1 refills | Status: DC

## 2017-04-23 MED ORDER — LISINOPRIL 10 MG PO TABS: 10.0000 mg | ORAL_TABLET | Freq: Every day | ORAL | 1 refills | Status: DC

## 2017-04-23 MED ORDER — CLOPIDOGREL BISULFATE 75 MG PO TABS: 75.0000 mg | ORAL_TABLET | Freq: Every day | ORAL | 2 refills | Status: DC

## 2017-04-23 NOTE — Patient Instructions (Addendum)
If you have any further lightheadedness, or low blood pressure readings please return to change meds.  We will recheck kidney function test today.  Call Dr. Blanch Media office as you appear to be due for repeat colonoscopy.  No change in meds today.  Please return Thursday to discuss rash and urine symptoms further. I will check prostate test today.  We can discuss other concerns from your paperwork at next visit.  Low intensity exercise most days per week. Walking to start.  Return to the clinic or go to the nearest emergency room if any of your symptoms worsen or new symptoms occur. Thank you for coming in today.   Preventive Care 72 Years and Older, Male Preventive care refers to lifestyle choices and visits with your health care provider that can promote health and wellness. What does preventive care include?  A yearly physical exam. This is also called an annual well check.  Dental exams once or twice a year.  Routine eye exams. Ask your health care provider how often you should have your eyes checked.  Personal lifestyle choices, including: ? Daily care of your teeth and gums. ? Regular physical activity. ? Eating a healthy diet. ? Avoiding tobacco and drug use. ? Limiting alcohol use. ? Practicing safe sex. ? Taking low doses of aspirin every day. ? Taking vitamin and mineral supplements as recommended by your health care provider. What happens during an annual well check? The services and screenings done by your health care provider during your annual well check will depend on your age, overall health, lifestyle risk factors, and family history of disease. Counseling Your health care provider may ask you questions about your:  Alcohol use.  Tobacco use.  Drug use.  Emotional well-being.  Home and relationship well-being.  Sexual activity.  Eating habits.  History of falls.  Memory and ability to understand (cognition).  Work and work  Statistician.  Screening You may have the following tests or measurements:  Height, weight, and BMI.  Blood pressure.  Lipid and cholesterol levels. These may be checked every 5 years, or more frequently if you are over 38 years old.  Skin check.  Lung cancer screening. You may have this screening every year starting at age 68 if you have a 30-pack-year history of smoking and currently smoke or have quit within the past 15 years.  Fecal occult blood test (FOBT) of the stool. You may have this test every year starting at age 48.  Flexible sigmoidoscopy or colonoscopy. You may have a sigmoidoscopy every 5 years or a colonoscopy every 10 years starting at age 42.  Prostate cancer screening. Recommendations will vary depending on your family history and other risks.  Hepatitis C blood test.  Hepatitis B blood test.  Sexually transmitted disease (STD) testing.  Diabetes screening. This is done by checking your blood sugar (glucose) after you have not eaten for a while (fasting). You may have this done every 1-3 years.  Abdominal aortic aneurysm (AAA) screening. You may need this if you are a current or former smoker.  Osteoporosis. You may be screened starting at age 79 if you are at high risk.  Talk with your health care provider about your test results, treatment options, and if necessary, the need for more tests. Vaccines Your health care provider may recommend certain vaccines, such as:  Influenza vaccine. This is recommended every year.  Tetanus, diphtheria, and acellular pertussis (Tdap, Td) vaccine. You may need a Td booster every 10 years.  Varicella vaccine. You may need this if you have not been vaccinated.  Zoster vaccine. You may need this after age 46.  Measles, mumps, and rubella (MMR) vaccine. You may need at least one dose of MMR if you were born in 1957 or later. You may also need a second dose.  Pneumococcal 13-valent conjugate (PCV13) vaccine. One dose is  recommended after age 49.  Pneumococcal polysaccharide (PPSV23) vaccine. One dose is recommended after age 77.  Meningococcal vaccine. You may need this if you have certain conditions.  Hepatitis A vaccine. You may need this if you have certain conditions or if you travel or work in places where you may be exposed to hepatitis A.  Hepatitis B vaccine. You may need this if you have certain conditions or if you travel or work in places where you may be exposed to hepatitis B.  Haemophilus influenzae type b (Hib) vaccine. You may need this if you have certain risk factors.  Talk to your health care provider about which screenings and vaccines you need and how often you need them. This information is not intended to replace advice given to you by your health care provider. Make sure you discuss any questions you have with your health care provider. Document Released: 02/18/2015 Document Revised: 10/12/2015 Document Reviewed: 11/23/2014 Elsevier Interactive Patient Education  2018 [employer].        IF you received an x-ray today, you will receive an invoice from [facility]. Please contact [facility] at [phone] with questions or concerns regarding your invoice.   IF you received labwork today, you will receive an invoice from [place]. Please contact [facility] at [phone] with questions or concerns regarding your invoice.   Our billing staff will not be able to assist you with questions regarding bills from these companies.  You will be contacted with the lab results as soon as they are available. The fastest way to get your results is to activate your My Chart account. Instructions are located on the last page of this paperwork. If you have not heard from [place] regarding the results in 2 weeks, please contact this office.

## 2017-04-23 NOTE — Progress Notes (Signed)
Subjective:  By signing my name below, I, [name], attest that this documentation has been prepared under the direction and in the presence of [name], MD Electronically Signed: Ladene Artist, ED Scribe 04/23/2017 at 8:27 AM.   Patient ID: Seth [name], male    DOB: Apr 04, 1945, 72 y.o.   MRN: [id number]  Chief Complaint  Patient presents with  . Annual Exam   HPI Seth [name] is a 72 y.o. male who presents to Primary Care at [facility] for an annual exam. Recently had AWV. H/o HTN, AAA, peripheral vascular disease, hyperlipidemia, allergies. Followed by vascular surgery in [place] with [facility]. Last seen 11/26. H/o carotid artery stenosis, subclavian steal, common iliac artery stents, appointment 12/2016. Quit smoking early last yr. AAA stable at 4.13 cm. Planned for rpt ABIs. Aortic stable. Aortoiliac imaging and carotid duplex scan in 1 yr. Continue plavix daily.   Hyperlipidemia Lab Results  Component Value Date   CHOL 140 10/22/2016   HDL 39 (L) 10/22/2016   LDLCALC 81 10/22/2016   TRIG 98 10/22/2016   CHOLHDL 3.6 10/22/2016   Lab Results  Component Value Date   ALT 15 10/22/2016   AST 16 10/22/2016   ALKPHOS 91 10/22/2016   BILITOT 0.5 10/22/2016  Lipitor 20 mg daily. Denies myalgias or any new side-effects.  HTN Lab Results  Component Value Date   CREATININE 1.40 (H) 11/15/2016  Has had an increase since 04/2016. Advised to f/u in 6 wks from Oct to discuss elevated creatinine. Lisinopril 15 mg qd. Metoprolol 75 mg bid. Pt reports occasional lightheadedness and sleepiness with rare BP readings of 120/75-80s.  Pre-DM Lab Results  Component Value Date   HGBA1C 6.1 (H) 10/22/2016   Wt Readings from Last 3 Encounters:  04/23/17 212 lb (96.2 kg)  04/16/17 212 lb 2 oz (96.2 kg)  10/23/16 205 lb 9.6 oz (93.3 kg)   GU Pt reports difficulty starting and finishing stream. Also reports dribbling throughout stream, incomplete emptying, which seems to  be exacerbated when he's constipated. States symptoms have worsened over the past few months. He also reports urinary frequency for the past 7-8 yrs; states he urinates ~14 times/day. Denies hematuria.  Rash  Pt plans to discuss this further.   CA Screening Colonoscopy: 2015 Prostate CA Screening: Lab Results  Component Value Date   PSA 2.69 03/23/2015   PSA 2.36 09/07/2013   PSA 2.93 04/27/2011  Declined CT for lung CA screening  Immunizations Immunization History  Administered Date(s) Administered  . Hepatitis B 02/06/2000  . Td 02/05/2005  Shingles: declined at last visit  Fall Screening No falls in the past yr.  Depression Screening Depression screen [facility] 2/9 04/23/2017 04/16/2017 10/23/2016 10/22/2016 04/12/2016  Decreased Interest 0 0 0 0 0  Down, Depressed, Hopeless 0 0 0 0 0  PHQ - 2 Score 0 0 0 0 0   Functional Status Functional Status Survey:    Mental Status 6CIT Screen 04/16/2017  What Year? 0 points  What month? 0 points  What time? 0 points  Count back from 20 0 points  Months in reverse 0 points  Repeat phrase 0 points  Total Score 0     Visual Acuity Screening   Right eye Left eye Both eyes  Without correction:     With correction: 20/20 20/20 20/20    Vision: last eye exam was in Jan Dentist: has dentures; not followed by a dentist Exercise: no regular exercise but plans to start  Advanced Directives Pt has a power of attorney and living will. Copies were requested.  Patient Active Problem List   Diagnosis Date Noted  . PVD (peripheral vascular disease) ([place]) 10/15/2013  . Tobacco use disorder 10/15/2013  . Pure hypercholesterolemia 10/15/2013  . AAA (abdominal aortic aneurysm) ([place]) 10/15/2013  . Coronary artery disease involving native coronary artery of native heart without angina pectoris 10/15/2013  . Cataract 09/29/2013  . Colon cancer screening 09/25/2013  . AAA (abdominal aortic aneurysm) without rupture ([place]) 10/22/2012  . Nicotine  addiction 10/17/2012  . Atherosclerosis of native arteries of the extremities with ulceration(440.23) 10/17/2012  . Carotid bruit 10/17/2012  . Other nonspecific abnormal cardiovascular system function study 04/10/2012  . History of sciatica   . Essential hypertension, benign   . Seasonal allergies   . HTN (hypertension) 04/04/2011  . Abdominal aortic aneurysm ([place]) 11/03/2010   Past Medical History:  Diagnosis Date  . AAA (abdominal aortic aneurysm) ([place])   . Allergic rhinitis   . Anxiety   . Back pain   . CAD (coronary artery disease)   . Essential hypertension, benign   . Gout   . History of sciatica   . Hyperlipidemia   . PVD (peripheral vascular disease) ([place])   . Seasonal allergies    Past Surgical History:  Procedure Laterality Date  . CARDIAC CATHETERIZATION     stent placement   Allergies  Allergen Reactions  . Biaxin [Clarithromycin] Shortness Of Breath  . Clarithromycin Shortness Of Breath  . Iodinated Diagnostic Agents Hives  . Cortisone Other (See Comments)    Turns red from breast up  . Iodine Hives    Dye from nuclear medicine test  . Prednisone    Prior to Admission medications   Medication Sig Start Date End Date Taking? Authorizing Provider  aspirin EC 81 MG tablet Take 81 mg by mouth daily.   Yes [provider]  atorvastatin (LIPITOR) 20 MG tablet Take 1 tablet (20 mg total) by mouth daily at 6 PM. 10/22/16  Yes [name], MD  cetirizine (ZYRTEC) 10 MG tablet Take 10 mg by mouth daily.   Yes [provider]  clopidogrel (PLAVIX) 75 MG tablet Take 75 mg by mouth daily.   Yes [provider]  clotrimazole (LOTRIMIN) 1 % cream Apply 1 application topically 2 (two) times daily. To affected area on groin. Use for 2 weeks, or at least 2 days after resolution of rash 10/23/16  Yes [name], MD  lisinopril (PRINIVIL,ZESTRIL) 10 MG tablet Take 1 tablet (10 mg total) by mouth daily. 10/22/16  Yes [name], MD    lisinopril (PRINIVIL,ZESTRIL) 5 MG tablet TAKE 1 TABLET (5 MG TOTAL) BY MOUTH DAILY. 10/22/16  Yes [name], MD  metoprolol tartrate (LOPRESSOR) 50 MG tablet Take 1.5 tablets (75 mg total) by mouth 2 (two) times daily. 10/22/16  Yes [name], MD  OVER THE COUNTER MEDICATION    Yes [provider]  ranitidine (ZANTAC) 75 MG tablet Take 75 mg by mouth 2 (two) times daily.   Yes [provider]  SALINE NA Place into the nose as needed.   Yes [provider]   Social History   Socioeconomic History  . Marital status: Married    Spouse name: Not on file  . Number of children: 1  . Years of education: Not on file  . Highest education level: Not on file  Social Needs  . Financial resource strain: Not  hard at all  . Food insecurity - worry: Never true  . Food insecurity - inability: Never true  . Transportation needs - medical: No  . Transportation needs - non-medical: No  Occupational History  . Occupation: retired  Tobacco Use  . Smoking status: Former Smoker    Packs/day: 3.00    Years: 50.00    Pack years: 150.00    Types: Cigarettes    Last attempt to quit: 04/05/2016    Years since quitting: 1.0  . Smokeless tobacco: Never Used  Substance and Sexual Activity  . Alcohol use: No    Alcohol/week: 0.0 oz    Comment: sometimes mixed drinks or beers  . Drug use: No  . Sexual activity: Not on file  Other Topics Concern  . Not on file  Social History Narrative   Married   Exercise: No   Education: GED    Review of Systems  Genitourinary: Positive for difficulty urinating and frequency. Negative for hematuria.  Musculoskeletal: Negative for myalgias.  Skin: Positive for rash.  Neurological: Positive for light-headedness (occasional).  Psychiatric/Behavioral: Positive for sleep disturbance (occasional).      Objective:   Physical Exam  Constitutional: He is oriented to person, place, and time. He appears well-developed and  well-nourished.  HENT:  Head: Normocephalic and atraumatic.  Right Ear: External ear normal.  Left Ear: External ear normal.  Mouth/Throat: Oropharynx is clear and moist.  Eyes: Pupils are equal, round, and reactive to light. Conjunctivae and EOM are normal.  Neck: Normal range of motion. Neck supple. No thyromegaly present.  Cardiovascular: Normal rate, regular rhythm, normal heart sounds and intact distal pulses.  Pulmonary/Chest: Effort normal and breath sounds normal. No respiratory distress. He has no wheezes.  Abdominal: Soft. He exhibits no distension. There is no tenderness. Hernia confirmed negative in the right inguinal area and confirmed negative in the left inguinal area.  Genitourinary: Prostate normal.  Genitourinary Comments: Firm area of prostate R lobe, nontender.  Musculoskeletal: Normal range of motion. He exhibits no edema or tenderness.  Lymphadenopathy:    He has no cervical adenopathy.  Neurological: He is alert and oriented to person, place, and time. He has normal reflexes.  Skin: Skin is warm and dry.  Psychiatric: He has a normal mood and affect. His behavior is normal.  Vitals reviewed.  Vitals:   04/23/17 0810  BP: 118/70  Pulse: 69  Resp: 16  Temp: 98 F (36.7 C)  TempSrc: Oral  SpO2: 99%  Weight: 212 lb (96.2 kg)  Height: 6' (1.829 m)      Assessment & Plan:    Seth [name] is a 72 y.o. male Annual physical exam  - -anticipatory guidance as below in AVS, screening labs above. Health maintenance items as above in HPI discussed/recommended as applicable.   Essential hypertension, benign - Plan: Comprehensive metabolic panel Essential hypertension - Plan: lisinopril (PRINIVIL,ZESTRIL) 10 MG tablet, lisinopril (PRINIVIL,ZESTRIL) 5 MG tablet, metoprolol tartrate (LOPRESSOR) 50 MG tablet  -Stable, no med changes for now, labs pending  PVD (peripheral vascular disease) ([place]) - Plan: clopidogrel (PLAVIX) 75 MG tablet AAA (abdominal aortic aneurysm)  without rupture (HCC)  -Continue Plavix, stable readings based on previous notes reviewed, continue routine follow-up with vascular specialist  Pure hypercholesterolemia - Plan: Lipid panel  -Tolerating Lipitor, continue same, labs pending  Prediabetes - Plan: Hemoglobin A1c  -Watch diet, activity, A1c pending  Urinary hesitancy - Plan: PSA, CANCELED: POCT urinalysis dipstick, CANCELED: POCT Microscopic Urinalysis ([facility]) Screening for prostate  cancer - Plan: PSA Urinary frequency - Plan: CANCELED: POCT urinalysis dipstick, CANCELED: POCT Microscopic Urinalysis ([facility])  - We discussed pros and cons of prostate cancer screening, and after this discussion, he chose to have screening done. PSA obtained  -Possible enlargement of the right side of prostate, PSA initially obtained to determine the next step given above symptoms with follow-up to discuss further next few days.  Also planning on following up to discuss rash on arms next visit  Need for hepatitis C screening test - Plan: Hepatitis C antibody   Meds ordered this encounter  Medications  . clopidogrel (PLAVIX) 75 MG tablet    Sig: Take 1 tablet (75 mg total) by mouth daily.    Dispense:  90 tablet    Refill:  2  . lisinopril (PRINIVIL,ZESTRIL) 10 MG tablet    Sig: Take 1 tablet (10 mg total) by mouth daily.    Dispense:  90 tablet    Refill:  1  . lisinopril (PRINIVIL,ZESTRIL) 5 MG tablet    Sig: TAKE 1 TABLET (5 MG TOTAL) BY MOUTH DAILY.    Dispense:  90 tablet    Refill:  1  . metoprolol tartrate (LOPRESSOR) 50 MG tablet    Sig: Take 1.5 tablets (75 mg total) by mouth 2 (two) times daily.    Dispense:  270 tablet    Refill:  1   Patient Instructions   If you have any further lightheadedness, or low blood pressure readings please return to change meds.  We will recheck kidney function test today.  Call Dr. Blanch Media office as you appear to be due for repeat colonoscopy.  No change in meds today.  Please return Thursday  to discuss rash and urine symptoms further. I will check prostate test today.  We can discuss other concerns from your paperwork at next visit.  Low intensity exercise most days per week. Walking to start.  Return to the clinic or go to the nearest emergency room if any of your symptoms worsen or new symptoms occur. Thank you for coming in today.   Preventive Care 51 Years and Older, Male Preventive care refers to lifestyle choices and visits with your health care provider that can promote health and wellness. What does preventive care include?  A yearly physical exam. This is also called an annual well check.  Dental exams once or twice a year.  Routine eye exams. Ask your health care provider how often you should have your eyes checked.  Personal lifestyle choices, including: ? Daily care of your teeth and gums. ? Regular physical activity. ? Eating a healthy diet. ? Avoiding tobacco and drug use. ? Limiting alcohol use. ? Practicing safe sex. ? Taking low doses of aspirin every day. ? Taking vitamin and mineral supplements as recommended by your health care provider. What happens during an annual well check? The services and screenings done by your health care provider during your annual well check will depend on your age, overall health, lifestyle risk factors, and family history of disease. Counseling Your health care provider may ask you questions about your:  Alcohol use.  Tobacco use.  Drug use.  Emotional well-being.  Home and relationship well-being.  Sexual activity.  Eating habits.  History of falls.  Memory and ability to understand (cognition).  Work and work Statistician.  Screening You may have the following tests or measurements:  Height, weight, and BMI.  Blood pressure.  Lipid and cholesterol levels. These may be checked every 5  years, or more frequently if you are over 47 years old.  Skin check.  Lung cancer screening. You may have this  screening every year starting at age 50 if you have a 30-pack-year history of smoking and currently smoke or have quit within the past 15 years.  Fecal occult blood test (FOBT) of the stool. You may have this test every year starting at age 53.  Flexible sigmoidoscopy or colonoscopy. You may have a sigmoidoscopy every 5 years or a colonoscopy every 10 years starting at age 57.  Prostate cancer screening. Recommendations will vary depending on your family history and other risks.  Hepatitis C blood test.  Hepatitis B blood test.  Sexually transmitted disease (STD) testing.  Diabetes screening. This is done by checking your blood sugar (glucose) after you have not eaten for a while (fasting). You may have this done every 1-3 years.  Abdominal aortic aneurysm (AAA) screening. You may need this if you are a current or former smoker.  Osteoporosis. You may be screened starting at age 24 if you are at high risk.  Talk with your health care provider about your test results, treatment options, and if necessary, the need for more tests. Vaccines Your health care provider may recommend certain vaccines, such as:  Influenza vaccine. This is recommended every year.  Tetanus, diphtheria, and acellular pertussis (Tdap, Td) vaccine. You may need a Td booster every 10 years.  Varicella vaccine. You may need this if you have not been vaccinated.  Zoster vaccine. You may need this after age 29.  Measles, mumps, and rubella (MMR) vaccine. You may need at least one dose of MMR if you were born in 1957 or later. You may also need a second dose.  Pneumococcal 13-valent conjugate (PCV13) vaccine. One dose is recommended after age 56.  Pneumococcal polysaccharide (PPSV23) vaccine. One dose is recommended after age 53.  Meningococcal vaccine. You may need this if you have certain conditions.  Hepatitis A vaccine. You may need this if you have certain conditions or if you travel or work in places where  you may be exposed to hepatitis A.  Hepatitis B vaccine. You may need this if you have certain conditions or if you travel or work in places where you may be exposed to hepatitis B.  Haemophilus influenzae type b (Hib) vaccine. You may need this if you have certain risk factors.  Talk to your health care provider about which screenings and vaccines you need and how often you need them. This information is not intended to replace advice given to you by your health care provider. Make sure you discuss any questions you have with your health care provider. Document Released: 02/18/2015 Document Revised: 10/12/2015 Document Reviewed: 11/23/2014 Elsevier Interactive Patient Education  2018 [employer].        IF you received an x-ray today, you will receive an invoice from [facility]. Please contact [facility] at [phone] with questions or concerns regarding your invoice.   IF you received labwork today, you will receive an invoice from [place]. Please contact [facility] at [phone] with questions or concerns regarding your invoice.   Our billing staff will not be able to assist you with questions regarding bills from these companies.  You will be contacted with the lab results as soon as they are available. The fastest way to get your results is to activate your My Chart account. Instructions are located on the last page of this paperwork. If you have not heard  from [place] regarding the results in 2 weeks, please contact this office.      .I personally performed the services described in this documentation, which was scribed in my presence. The recorded information has been reviewed and considered for accuracy and completeness, addended by me as needed, and agree with information above.  Signed,   [name], MD [facility] at [place].  04/25/17 8:11 AM

## 2017-04-24 LAB — COMPREHENSIVE METABOLIC PANEL
ALBUMIN: 4.5 g/dL (ref 3.5–4.8)
ALT: 14 IU/L (ref 0–44)
AST: 14 IU/L (ref 0–40)
Albumin/Globulin Ratio: 1.7 (ref 1.2–2.2)
Alkaline Phosphatase: 101 IU/L (ref 39–117)
BUN/Creatinine Ratio: 7 — ABNORMAL LOW (ref 10–24)
BUN: 11 mg/dL (ref 8–27)
Bilirubin Total: 0.5 mg/dL (ref 0.0–1.2)
CALCIUM: 9.6 mg/dL (ref 8.6–10.2)
CHLORIDE: 99 mmol/L (ref 96–106)
CO2: 23 mmol/L (ref 20–29)
CREATININE: 1.47 mg/dL — AB (ref 0.76–1.27)
GFR calc non Af Amer: 47 mL/min/{1.73_m2} — ABNORMAL LOW (ref >59)
GFR, EST AFRICAN AMERICAN: 55 mL/min/{1.73_m2} — AB (ref >59)
Globulin, Total: 2.6 g/dL (ref 1.5–4.5)
Glucose: 115 mg/dL — ABNORMAL HIGH (ref 65–99)
Potassium: 4.9 mmol/L (ref 3.5–5.2)
Sodium: 138 mmol/L (ref 134–144)
TOTAL PROTEIN: 7.1 g/dL (ref 6.0–8.5)

## 2017-04-24 LAB — LIPID PANEL
Chol/HDL Ratio: 3.7 ratio (ref 0.0–5.0)
Cholesterol, Total: 149 mg/dL (ref 100–199)
HDL: 40 mg/dL (ref >39)
LDL CALC: 88 mg/dL (ref 0–99)
Triglycerides: 107 mg/dL (ref 0–149)
VLDL CHOLESTEROL CAL: 21 mg/dL (ref 5–40)

## 2017-04-24 LAB — PSA: PROSTATE SPECIFIC AG, SERUM: 3.3 ng/mL (ref 0.0–4.0)

## 2017-04-24 LAB — HEPATITIS C ANTIBODY: Hep C Virus Ab: <0.1 s/co ratio (ref 0.0–0.9)

## 2017-04-24 LAB — HEMOGLOBIN A1C
Est. average glucose Bld gHb Est-mCnc: 131 mg/dL
Hgb A1c MFr Bld: 6.2 % — ABNORMAL HIGH (ref 4.8–5.6)

## 2017-04-25 ENCOUNTER — Encounter: Payer: Self-pay | Admitting: Family Medicine

## 2017-04-25 ENCOUNTER — Ambulatory Visit (INDEPENDENT_AMBULATORY_CARE_PROVIDER_SITE_OTHER): Payer: Medicare Other | Admitting: Family Medicine

## 2017-04-25 ENCOUNTER — Other Ambulatory Visit: Payer: Self-pay

## 2017-04-25 VITALS — BP 136/70 | HR 67 | Temp 98.0°F | Ht 72.0 in | Wt 213.8 lb

## 2017-04-25 DIAGNOSIS — R3911 Hesitancy of micturition: Secondary | ICD-10-CM

## 2017-04-25 DIAGNOSIS — R21 Rash and other nonspecific skin eruption: Secondary | ICD-10-CM | POA: Diagnosis not present

## 2017-04-25 DIAGNOSIS — R350 Frequency of micturition: Secondary | ICD-10-CM

## 2017-04-25 DIAGNOSIS — R3989 Other symptoms and signs involving the genitourinary system: Secondary | ICD-10-CM

## 2017-04-25 DIAGNOSIS — R7989 Other specified abnormal findings of blood chemistry: Secondary | ICD-10-CM

## 2017-04-25 LAB — POCT URINALYSIS DIP (MANUAL ENTRY)
BILIRUBIN UA: NEGATIVE
BILIRUBIN UA: NEGATIVE mg/dL
Glucose, UA: NEGATIVE mg/dL
Leukocytes, UA: NEGATIVE
Nitrite, UA: NEGATIVE
PROTEIN UA: NEGATIVE mg/dL
RBC UA: NEGATIVE
SPEC GRAV UA: 1.02 (ref 1.010–1.025)
Urobilinogen, UA: 0.2 E.U./dL
pH, UA: 5.5 (ref 5.0–8.0)

## 2017-04-25 LAB — POC MICROSCOPIC URINALYSIS (UMFC): Mucus: ABSENT

## 2017-04-25 MED ORDER — TRIAMCINOLONE ACETONIDE 0.1 % EX CREA: 1.0000 "application " | TOPICAL_CREAM | Freq: Two times a day (BID) | CUTANEOUS | 0 refills | Status: DC

## 2017-04-25 MED ORDER — TAMSULOSIN HCL 0.4 MG PO CAPS: 0.4000 mg | ORAL_CAPSULE | Freq: Every day | ORAL | 1 refills | Status: DC

## 2017-04-25 NOTE — Progress Notes (Signed)
Subjective:  By signing my name below, I, [name], attest that this documentation has been prepared under the direction and in the presence of [name], MD. Electronically Signed: [name], [place]. 04/25/2017 , 1:03 PM .  Patient was seen in Room 11 .   Patient ID: Seth [name], male    DOB: 01/19/1946, 72 y.o.   MRN: [id number] Chief Complaint  Patient presents with  . Urinary Tract Infection    10-13 x a day of viod.  the flow is like droplets at times. feels like something is stoped up.   . Rash    rash on arms only & has talken to allergist about it. (secondary concern)   HPI Seth [name] is a 72 y.o. male  Here for follow up of concerns from last visit, at time of physical. He was seen 3 days ago.   Lab Work His Hep C screening was negative.  Diabetes: A1C was 6.2 last visit, March 19th; his brother has diabetes.   Urinary issues He reported dribbling and incomplete emptying, worse when constipated. He's had urinary frequency for about 7 years, but seemed to be worsen in the past 2 months to where he voids 14 times a day. He denies hematuria. PSA obtained last visit, 3.3. He did have a possible firm area on the right lobe of his prostate.   He states trouble initiating urination, where urine voided is decreased. He drinks about 4 half mugs of coffee a day, and about 2 16-oz bottles of water a day. His last PSA on March 19th, was 3.3. Prior PSA on Feb 2017, was 2.69. He's been seen by [facility] in 2014 for hematuria.   Lab Results  Component Value Date   PSA1 3.3 04/23/2017   Lab Results  Component Value Date   PSA 2.69 03/23/2015   PSA 2.36 09/07/2013   PSA 2.93 04/27/2011    Rash Initially mentioned at his annual wellness visit. Patient states initially noticed itchy about 2-3 months, and gradually worsened. He's been scratching the areas, and have caused bruising and redness over the areas. He's been applying hydrating moisturizer, and tried OTC  steroid cream applying twice a day for about 3-4 days. He denies any new medications recently. He mentions one of his sisters has similar dry skin patch.   He isn't able to take cortisone pills or injections due to redness from his chest up; denies any issues with cortisone cream in the past.   Patient Active Problem List   Diagnosis Date Noted  . PVD (peripheral vascular disease) ([place]) 10/15/2013  . Tobacco use disorder 10/15/2013  . Pure hypercholesterolemia 10/15/2013  . AAA (abdominal aortic aneurysm) ([place]) 10/15/2013  . Coronary artery disease involving native coronary artery of native heart without angina pectoris 10/15/2013  . Cataract 09/29/2013  . Colon cancer screening 09/25/2013  . AAA (abdominal aortic aneurysm) without rupture ([place]) 10/22/2012  . Nicotine addiction 10/17/2012  . Atherosclerosis of native arteries of the extremities with ulceration(440.23) 10/17/2012  . Carotid bruit 10/17/2012  . Other nonspecific abnormal cardiovascular system function study 04/10/2012  . History of sciatica   . Essential hypertension, benign   . Seasonal allergies   . HTN (hypertension) 04/04/2011  . Abdominal aortic aneurysm ([place]) 11/03/2010   Past Medical History:  Diagnosis Date  . AAA (abdominal aortic aneurysm) ([place])   . Allergic rhinitis   . Anxiety   . Back pain   . CAD (coronary artery disease)   . Essential hypertension,  benign   . Gout   . History of sciatica   . Hyperlipidemia   . PVD (peripheral vascular disease) ([place])   . Seasonal allergies    Past Surgical History:  Procedure Laterality Date  . CARDIAC CATHETERIZATION     stent placement   Allergies  Allergen Reactions  . Biaxin [Clarithromycin] Shortness Of Breath  . Clarithromycin Shortness Of Breath  . Iodinated Diagnostic Agents Hives  . Cortisone Other (See Comments)    Turns red from breast up  . Iodine Hives    Dye from nuclear medicine test  . Prednisone    Prior to Admission medications     Medication Sig Start Date End Date Taking? Authorizing Provider  aspirin EC 81 MG tablet Take 81 mg by mouth daily.    [provider]  atorvastatin (LIPITOR) 20 MG tablet Take 1 tablet (20 mg total) by mouth daily at 6 PM. 10/22/16   [name], MD  cetirizine (ZYRTEC) 10 MG tablet Take 10 mg by mouth daily.    [provider]  clopidogrel (PLAVIX) 75 MG tablet Take 1 tablet (75 mg total) by mouth daily. 04/23/17   [name], MD  clotrimazole (LOTRIMIN) 1 % cream Apply 1 application topically 2 (two) times daily. To affected area on groin. Use for 2 weeks, or at least 2 days after resolution of rash 10/23/16   [name], MD  lisinopril (PRINIVIL,ZESTRIL) 10 MG tablet Take 1 tablet (10 mg total) by mouth daily. 04/23/17   [name], MD  lisinopril (PRINIVIL,ZESTRIL) 5 MG tablet TAKE 1 TABLET (5 MG TOTAL) BY MOUTH DAILY. 04/23/17   [name], MD  metoprolol tartrate (LOPRESSOR) 50 MG tablet Take 1.5 tablets (75 mg total) by mouth 2 (two) times daily. 04/23/17   [name], MD  OVER THE COUNTER MEDICATION     [provider]  ranitidine (ZANTAC) 75 MG tablet Take 75 mg by mouth 2 (two) times daily.    [provider]  SALINE NA Place into the nose as needed.    [provider]   Social History   Socioeconomic History  . Marital status: Married    Spouse name: Not on file  . Number of children: 1  . Years of education: Not on file  . Highest education level: Not on file  Occupational History  . Occupation: retired  Scientific laboratory technician  . Financial resource strain: Not hard at all  . Food insecurity:    Worry: Never true    Inability: Never true  . Transportation needs:    Medical: No    Non-medical: No  Tobacco Use  . Smoking status: Former Smoker    Packs/day: 3.00    Years: 50.00    Pack years: 150.00    Types: Cigarettes    Last attempt to quit: 04/05/2016    Years since quitting: 1.0  . Smokeless  tobacco: Never Used  Substance and Sexual Activity  . Alcohol use: No    Alcohol/week: 0.0 oz    Comment: sometimes mixed drinks or beers  . Drug use: No  . Sexual activity: Not on file  Lifestyle  . Physical activity:    Days per week: 0 days    Minutes per session: 0 min  . Stress: Not at all  Relationships  . Social connections:    Talks on phone: More than three times a week    Gets together: More than three times a week  Attends religious service: Never    Active member of club or organization: No    Attends meetings of clubs or organizations: Never    Relationship status: Married  . Intimate partner violence:    Fear of current or ex partner: No    Emotionally abused: No    Physically abused: No    Forced sexual activity: No  Other Topics Concern  . Not on file  Social History Narrative   Married   Exercise: No   Education: GED   Review of Systems  Constitutional: Negative for fatigue and unexpected weight change.  Eyes: Negative for visual disturbance.  Respiratory: Negative for cough, chest tightness and shortness of breath.   Cardiovascular: Negative for chest pain, palpitations and leg swelling.  Gastrointestinal: Negative for abdominal pain and blood in stool.  Genitourinary: Positive for decreased urine volume and frequency.  Skin: Positive for rash.  Neurological: Negative for dizziness, light-headedness and headaches.       Objective:   Physical Exam  Constitutional: He is oriented to person, place, and time. He appears well-developed and well-nourished.  HENT:  Head: Normocephalic and atraumatic.  Eyes: Pupils are equal, round, and reactive to light. EOM are normal.  Neck: No JVD present. Carotid bruit is not present.  Cardiovascular: Normal rate, regular rhythm and normal heart sounds.  No murmur heard. Pulmonary/Chest: Effort normal and breath sounds normal. He has no rales.  Abdominal: Soft. He exhibits no distension. There is no tenderness.    Abdomen soft, non tender, non distended including suprapubic  Musculoskeletal: He exhibits no edema.  Neurological: He is alert and oriented to person, place, and time.  Skin: Skin is warm and dry.  Upper extremities: excoriated patches with some faint bruising dorsal forearms bilaterally Right lower extremity: excoriated patch with some dry skin on right lower leg  Psychiatric: He has a normal mood and affect.  Vitals reviewed.   Vitals:   04/25/17 1216  BP: 136/70  Pulse: 67  Temp: 98 F (36.7 C)  TempSrc: Oral  SpO2: 96%  Weight: 213 lb 12.8 oz (97 kg)  Height: 6' (1.829 m)   Results for orders placed or performed in visit on 04/25/17  POCT Microscopic Urinalysis ([facility])  Result Value Ref Range   WBC,UR,HPF,POC None None WBC/hpf   RBC,UR,HPF,POC None None RBC/hpf   Bacteria None None, Too numerous to count   Mucus Absent Absent   Epithelial Cells, UR Per Microscopy Few (A) None, Too numerous to count cells/hpf  POCT urinalysis dipstick  Result Value Ref Range   Color, UA yellow yellow   Clarity, UA clear clear   Glucose, UA negative negative mg/dL   Bilirubin, UA negative negative   Ketones, POC UA negative negative mg/dL   Spec Grav, UA 1.020 1.010 - 1.025   Blood, UA negative negative   pH, UA 5.5 5.0 - 8.0   Protein Ur, POC negative negative mg/dL   Urobilinogen, UA 0.2 0.2 or 1.0 E.U./dL   Nitrite, UA Negative Negative   Leukocytes, UA Negative Negative        Assessment & Plan:   Seth [name] is a 72 y.o. male Urinary hesitancy - Plan: POCT Microscopic Urinalysis ([facility]), POCT urinalysis dipstick, Ambulatory referral to Urology Urinary frequency - Plan: POCT Microscopic Urinalysis ([facility]), POCT urinalysis dipstick, Ambulatory referral to Urology Abnormal prostate exam - Plan: Ambulatory referral to Urology  -Minimal change in PSA as above.  Suspect BPH component, but also with potentially abnormal prostate exam last visit.  Urology eval pending.  No apparent  sign of infection on in office testing.  -Trial of Flomax, RTC precautions if any worsening, or symptoms of retention  Elevated serum creatinine  -May be related to chronic medical problems, but also potentially obstructive uropathy with prostate.  Trial of Flomax, follow-up with urology as above, recheck 6 weeks.  Avoid NSAIDs, maintain hydration  Rash and nonspecific skin eruption - Plan: Ambulatory referral to Dermatology  -Dressing dermatitis with excoriation versus photodermatitis/medication related.  Noted on one leg, but primarily forearms.  -Trial of triamcinolone twice daily, hydrating lotion, and refer to dermatology.  RTC precautions if any acute worsening.  No change in chronic meds at this time  Meds ordered this encounter  Medications  . triamcinolone cream (KENALOG) 0.1 %    Sig: Apply 1 application topically 2 (two) times daily.    Dispense:  30 g    Refill:  0  . tamsulosin (FLOMAX) 0.4 MG CAPS capsule    Sig: Take 1 capsule (0.4 mg total) by mouth daily.    Dispense:  30 capsule    Refill:  1   Patient Instructions   For difficulty urinating, I would like you to meet with a urologist. You may have an enlarged prostate, so can try flomax to see if that helps in the meantime. If any new side effects on that med as we discussed, stop it for now. If you are unable to urinate at all, you need to be seen right away.   Kidney function slightly elevated. That may be due to prostate or history of high blood pressure. Return for recheck kidney test in next 6 weeks. Avoid nsaids, stay hydrated (water is best, try to avoid caffeine as much as possible).  Prediabetes test stable. Continue to watch diet and exercise. Repeat diabetes test in next 6 months.   I would like you to see dermatologist for the arm rash to see if they think you need to change your medications.  For now apply eucerin lotion to those areas twice per day and the new steroid cream up to twice per day for itching.   If any side effects or reaction to steroid cream - stop it right away.   Return to the clinic or go to the nearest emergency room if any of your symptoms worsen or new symptoms occur.    IF you received an x-ray today, you will receive an invoice from [facility]. Please contact [facility] at [phone] with questions or concerns regarding your invoice.   IF you received labwork today, you will receive an invoice from [place]. Please contact [facility] at [phone] with questions or concerns regarding your invoice.   Our billing staff will not be able to assist you with questions regarding bills from these companies.  You will be contacted with the lab results as soon as they are available. The fastest way to get your results is to activate your My Chart account. Instructions are located on the last page of this paperwork. If you have not heard from [place] regarding the results in 2 weeks, please contact this office.       I personally performed the services described in this documentation, which was scribed in my presence. The recorded information has been reviewed and considered for accuracy and completeness, addended by me as needed, and agree with information above.  Signed,   [name], MD [facility] [place].  04/27/17 11:41 AM

## 2017-04-25 NOTE — Patient Instructions (Addendum)
For difficulty urinating, I would like you to meet with a urologist. You may have an enlarged prostate, so can try flomax to see if that helps in the meantime. If any new side effects on that med as we discussed, stop it for now. If you are unable to urinate at all, you need to be seen right away.   Kidney function slightly elevated. That may be due to prostate or history of high blood pressure. Return for recheck kidney test in next 6 weeks. Avoid nsaids, stay hydrated (water is best, try to avoid caffeine as much as possible).  Prediabetes test stable. Continue to watch diet and exercise. Repeat diabetes test in next 6 months.   I would like you to see dermatologist for the arm rash to see if they think you need to change your medications.  For now apply eucerin lotion to those areas twice per day and the new steroid cream up to twice per day for itching.  If any side effects or reaction to steroid cream - stop it right away.   Return to the clinic or go to the nearest emergency room if any of your symptoms worsen or new symptoms occur.    IF you received an x-ray today, you will receive an invoice from [facility]. Please contact [facility] at [phone] with questions or concerns regarding your invoice.   IF you received labwork today, you will receive an invoice from [place]. Please contact [facility] at [phone] with questions or concerns regarding your invoice.   Our billing staff will not be able to assist you with questions regarding bills from these companies.  You will be contacted with the lab results as soon as they are available. The fastest way to get your results is to activate your My Chart account. Instructions are located on the last page of this paperwork. If you have not heard from [place] regarding the results in 2 weeks, please contact this office.

## 2017-04-27 ENCOUNTER — Encounter: Payer: Self-pay | Admitting: Family Medicine

## 2017-05-20 DIAGNOSIS — L304 Erythema intertrigo: Secondary | ICD-10-CM | POA: Diagnosis not present

## 2017-05-20 DIAGNOSIS — L308 Other specified dermatitis: Secondary | ICD-10-CM | POA: Diagnosis not present

## 2017-06-05 ENCOUNTER — Encounter: Payer: Self-pay | Admitting: Internal Medicine

## 2017-06-05 ENCOUNTER — Ambulatory Visit (INDEPENDENT_AMBULATORY_CARE_PROVIDER_SITE_OTHER): Payer: Medicare Other | Admitting: Internal Medicine

## 2017-06-05 VITALS — BP 112/74 | HR 80 | Ht 72.0 in | Wt 213.4 lb

## 2017-06-05 DIAGNOSIS — Z86010 Personal history of colonic polyps: Secondary | ICD-10-CM

## 2017-06-05 DIAGNOSIS — R194 Change in bowel habit: Secondary | ICD-10-CM | POA: Diagnosis not present

## 2017-06-05 DIAGNOSIS — Z7902 Long term (current) use of antithrombotics/antiplatelets: Secondary | ICD-10-CM | POA: Diagnosis not present

## 2017-06-05 MED ORDER — NA SULFATE-K SULFATE-MG SULF 17.5-3.13-1.6 GM/177ML PO SOLN: 1.0000 | Freq: Once | ORAL | 0 refills | Status: AC

## 2017-06-05 NOTE — Progress Notes (Signed)
HISTORY OF PRESENT ILLNESS:  Seth [name] is a 72 y.o. male with peripheral vascular disease for which she is undergone peripheral vascular stent placement remotely and is on Plavix. He takes aspirin sporadically. He presents today regarding surveillance colonoscopy. GI review of systems is remarkable for alternating bowel habits. He reports typically having normal bowel movements but several days per month) diarrhea followed by constipation.No bleeding. His index colonoscopy was performed 10/20/2013. At that time he was found to have 3 tubular adenomas including a 10 mm rectal lesion which were removed. Exam was otherwise normal. He was taken off Plavix 5 days prior to that examination. Patient has had no significant interval cardiac or vascular problems. Review of outside blood work from 04/23/2017 finds a unremarkable comprehensive metabolic panel except for creatinine 1.47. There has been no interval change in family history.  REVIEW OF SYSTEMS:  All non-GI ROS negative unless otherwise stated in the history of present illness except for inus and allergy, hearing problems, itching, skin rash, excessive urination  Past Medical History:  Diagnosis Date  . AAA (abdominal aortic aneurysm) ([place])   . Allergic rhinitis   . Anxiety   . Back pain   . CAD (coronary artery disease)   . Essential hypertension, benign   . Gout   . History of sciatica   . Hyperlipidemia   . PVD (peripheral vascular disease) ([place])   . Seasonal allergies     Past Surgical History:  Procedure Laterality Date  . CARDIAC CATHETERIZATION     stent placement    Social History Seth [name]  reports that he quit smoking about 14 months ago. His smoking use included cigarettes. He has a 150.00 pack-year smoking history. He has never used smokeless tobacco. He reports that he does not drink alcohol or use drugs.  family history includes Cancer in his maternal grandmother; Diabetes in his paternal grandfather, paternal  grandmother, and sister; Heart disease in his brother, maternal uncle, mother, paternal grandfather, paternal grandmother, and sister; Hyperlipidemia in his brother, paternal grandfather, paternal grandmother, and sister; Hypertension in his brother and mother; Leukemia in his maternal uncle; Stroke in his mother.  Allergies  Allergen Reactions  . Biaxin [Clarithromycin] Shortness Of Breath  . Clarithromycin Shortness Of Breath  . Iodinated Diagnostic Agents Hives  . Cortisone Other (See Comments)    Turns red from breast up  . Iodine Hives    Dye from nuclear medicine test  . Prednisone        PHYSICAL EXAMINATION: Vital signs: BP 112/74   Pulse 80   Ht 6' (1.829 m)   Wt 213 lb 6 oz (96.8 kg)   BMI 28.94 kg/m   Constitutional: generally well-appearing, no acute distress Psychiatric: alert and oriented x3, cooperative Eyes: extraocular movements intact, anicteric, conjunctiva pink Mouth: oral pharynx moist, no lesions Neck: supple no lymphadenopathy Cardiovascular: heart regular rate and rhythm, no murmur Lungs: clear to auscultation bilaterally Abdomen: soft, nontender, nondistended, no obvious ascites, no peritoneal signs, normal bowel sounds, no organomegaly Rectal:deferred until colonoscopy Extremities: no clubbing, cyanosis, or lower extremity edema bilaterally Skin: no lesions on visible extremities Neuro: No focal deficits. ranial nerves intact  ASSESSMENT:  #1. History of multiple adenomatous and advanced colon polyp September 2015. Due for surveillance #2. Peripheral vascular disease on aspirin and Plavix #3. Alternating bowel habits without alarm features  PLAN:  #1. Surveillance colonoscopy. Patient is higher than baseline risk due to his need for antiplatelet therapy.The nature of the procedure, as well as the  risks, benefits, and alternatives were carefully and thoroughly reviewed with the patient. Ample time for discussion and questions allowed. The patient  understood, was satisfied, and agreed to proceed. #2. Hold Plavix 5 days prior to the examination to reduce the risk of procedure related bleeding. However, I did ask him to take his aspirin on a regular basis prior to during and shortly after this interval. He agreed. #3. Fiber supplementation with psyllium or like agent for alternating bowel habits  A copy of this office note has been sent to Dr. Carlota [name]

## 2017-06-05 NOTE — Patient Instructions (Signed)

## 2017-06-06 ENCOUNTER — Encounter: Payer: Self-pay | Admitting: Family Medicine

## 2017-06-06 ENCOUNTER — Ambulatory Visit (INDEPENDENT_AMBULATORY_CARE_PROVIDER_SITE_OTHER): Payer: Medicare Other | Admitting: Family Medicine

## 2017-06-06 VITALS — BP 124/78 | HR 75 | Temp 97.8°F | Ht 72.0 in | Wt 213.2 lb

## 2017-06-06 DIAGNOSIS — R7989 Other specified abnormal findings of blood chemistry: Secondary | ICD-10-CM | POA: Diagnosis not present

## 2017-06-06 DIAGNOSIS — L853 Xerosis cutis: Secondary | ICD-10-CM

## 2017-06-06 DIAGNOSIS — N429 Disorder of prostate, unspecified: Secondary | ICD-10-CM

## 2017-06-06 NOTE — Patient Instructions (Addendum)
I'm glad to hear that the skin issue has improved.   Keep appointment with urology to evaluate the prostate, but as urination has improved ok to hold off taking the tamsulosin for now.   I will recheck the kidney test today. If stable, can repeat testing in 6 months. Make sure to stay hydrated, and follow up with urology to make sure the prostate is not contributing to kidney function.   Thank you for coming in today.   IF you received an x-ray today, you will receive an invoice from [facility]. Please contact [facility] at [phone] with questions or concerns regarding your invoice.   IF you received labwork today, you will receive an invoice from [place]. Please contact [facility] at [phone] with questions or concerns regarding your invoice.   Our billing staff will not be able to assist you with questions regarding bills from these companies.  You will be contacted with the lab results as soon as they are available. The fastest way to get your results is to activate your My Chart account. Instructions are located on the last page of this paperwork. If you have not heard from [place] regarding the results in 2 weeks, please contact this office.

## 2017-06-06 NOTE — Progress Notes (Addendum)
Subjective:  By signing my name below, I, Seth [name], attest that this documentation has been prepared under the direction and in the presence of [name], MD Electronically Signed: Ladene Artist, ED Scribe 06/06/2017 at 10:42 AM.   Patient ID: Seth [name], male    DOB: 07/12/1945, 72 y.o.   MRN: [id number]  Chief Complaint  Patient presents with  . Skin Problem    f/u appointment    HPI Seth [name] is a 72 y.o. male who presents to Primary Care at [facility] for skin issue. See last visit. Rash was on his arms with excoriating patches, faint bruising, dorsal forearms, 1 small area on R LE. Possible photodermatitis vs dry skin. Initially tried triamcinolone bid, lotion and referred to dermatology Dr. [place] Crane and Dr. Rozann Lesches, referral sent 3/28.  Pt saw dermatology 3 wks ago who started him on 2 different creams. States that rash on arms have improved.  Referral to Urology  Slightly abnormal prostate. Pt reported urinary hesitancy. Urology eval was requested. Tried Flomax. - Pt never tried Flomax. Decided to increase water intake to 4 16 oz bottles of water daily instead and has noticed significant improvement. Denies difficulty starting/stopping stream, hematuria. He has an appointment in June.   Elevated Creatinine  1.4-1.47 1 month ago.  Health Maintenance Colonoscopy scheduled for July.  Patient Active Problem List   Diagnosis Date Noted  . PVD (peripheral vascular disease) ([place]) 10/15/2013  . Tobacco use disorder 10/15/2013  . Pure hypercholesterolemia 10/15/2013  . AAA (abdominal aortic aneurysm) ([place]) 10/15/2013  . Coronary artery disease involving native coronary artery of native heart without angina pectoris 10/15/2013  . Cataract 09/29/2013  . Colon cancer screening 09/25/2013  . AAA (abdominal aortic aneurysm) without rupture ([place]) 10/22/2012  . Nicotine addiction 10/17/2012  . Atherosclerosis of native arteries of the extremities with ulceration(440.23) 10/17/2012   . Carotid bruit 10/17/2012  . Other nonspecific abnormal cardiovascular system function study 04/10/2012  . History of sciatica   . Essential hypertension, benign   . Seasonal allergies   . HTN (hypertension) 04/04/2011  . Abdominal aortic aneurysm ([place]) 11/03/2010   Past Medical History:  Diagnosis Date  . AAA (abdominal aortic aneurysm) ([place])   . Allergic rhinitis   . Anxiety   . Back pain   . CAD (coronary artery disease)   . Essential hypertension, benign   . Gout   . History of sciatica   . Hyperlipidemia   . PVD (peripheral vascular disease) ([place])   . Seasonal allergies    Past Surgical History:  Procedure Laterality Date  . CARDIAC CATHETERIZATION     stent placement   Allergies  Allergen Reactions  . Biaxin [Clarithromycin] Shortness Of Breath  . Clarithromycin Shortness Of Breath  . Iodinated Diagnostic Agents Hives  . Cortisone Other (See Comments)    Turns red from breast up  . Iodine Hives    Dye from nuclear medicine test  . Prednisone    Prior to Admission medications   Medication Sig Start Date End Date Taking? Authorizing Provider  aspirin EC 81 MG tablet Take 81 mg by mouth daily.   Yes [provider]  atorvastatin (LIPITOR) 20 MG tablet Take 1 tablet (20 mg total) by mouth daily at 6 PM. 10/22/16  Yes [name], MD  cetirizine (ZYRTEC) 10 MG tablet Take 10 mg by mouth daily.   Yes [provider]  clobetasol cream (TEMOVATE) 0.05 %  05/20/17  Yes [provider]  clopidogrel (  PLAVIX) 75 MG tablet Take 1 tablet (75 mg total) by mouth daily. 04/23/17  Yes [name], MD  lisinopril (PRINIVIL,ZESTRIL) 10 MG tablet Take 1 tablet (10 mg total) by mouth daily. 04/23/17  Yes [name], MD  lisinopril (PRINIVIL,ZESTRIL) 5 MG tablet TAKE 1 TABLET (5 MG TOTAL) BY MOUTH DAILY. 04/23/17  Yes [name], MD  metoprolol tartrate (LOPRESSOR) 50 MG tablet Take 1.5 tablets (75 mg total) by mouth 2 (two) times  daily. 04/23/17  Yes [name], MD  ranitidine (ZANTAC) 75 MG tablet Take 75 mg by mouth 2 (two) times daily.   Yes [provider]  SALINE NA Place into the nose as needed.   Yes [provider]  triamcinolone cream (KENALOG) 0.1 % Apply 1 application topically 2 (two) times daily. 04/25/17  Yes [name], MD  clotrimazole (LOTRIMIN) 1 % cream Apply 1 application topically 2 (two) times daily. To affected area on groin. Use for 2 weeks, or at least 2 days after resolution of rash Patient not taking: Reported on 06/06/2017 10/23/16   [name], MD   Social History   Socioeconomic History  . Marital status: Married    Spouse name: Not on file  . Number of children: 1  . Years of education: Not on file  . Highest education level: Not on file  Occupational History  . Occupation: retired  Scientific laboratory technician  . Financial resource strain: Not hard at all  . Food insecurity:    Worry: Never true    Inability: Never true  . Transportation needs:    Medical: No    Non-medical: No  Tobacco Use  . Smoking status: Former Smoker    Packs/day: 3.00    Years: 50.00    Pack years: 150.00    Types: Cigarettes    Last attempt to quit: 04/05/2016    Years since quitting: 1.1  . Smokeless tobacco: Never Used  Substance and Sexual Activity  . Alcohol use: No    Alcohol/week: 0.0 oz    Comment: sometimes mixed drinks or beers  . Drug use: No  . Sexual activity: Not on file  Lifestyle  . Physical activity:    Days per week: 0 days    Minutes per session: 0 min  . Stress: Not at all  Relationships  . Social connections:    Talks on phone: More than three times a week    Gets together: More than three times a week    Attends religious service: Never    Active member of club or organization: No    Attends meetings of clubs or organizations: Never    Relationship status: Married  . Intimate partner violence:    Fear of current or ex partner: No    Emotionally  abused: No    Physically abused: No    Forced sexual activity: No  Other Topics Concern  . Not on file  Social History Narrative   Married   Exercise: No   Education: GED   Review of Systems  Genitourinary: Negative for difficulty urinating and hematuria.  Skin: Positive for rash (improving).      Objective:   Physical Exam  Constitutional: He is oriented to person, place, and time. He appears well-developed and well-nourished. No distress.  HENT:  Head: Normocephalic and atraumatic.  Eyes: Conjunctivae and EOM are normal.  Neck: Neck supple. No tracheal deviation present.  Cardiovascular: Normal rate.  Pulmonary/Chest: Effort normal. No respiratory  distress.  Musculoskeletal: Normal range of motion.  Neurological: He is alert and oriented to person, place, and time.  Skin: Skin is warm and dry. Rash noted.  Slight dry skin on dorsal forearms with few faint ecchymotic areas. Otherwise skin is intact.  Psychiatric: He has a normal mood and affect. His behavior is normal.  Nursing note and vitals reviewed.  Vitals:   06/06/17 1021  BP: 124/78  Pulse: 75  Temp: 97.8 F (36.6 C)  TempSrc: Oral  SpO2: 95%  Weight: 213 lb 3.2 oz (96.7 kg)  Height: 6' (1.829 m)      Assessment & Plan:  Seth [name] is a 71 y.o. male Elevated serum creatinine - Plan: Basic metabolic panel Abnormal prostate by palpation  -Possible chronic kidney disease, did have abnormal prostate on previous exam, BPH possible.  Reports improved symptoms at present.  Continue follow-up with urology as planned, has not needed to start Flomax.  Will repeat creatinine today as slight increase at last visit.  If that is stable, maintain hydration, avoid nephrotoxins, recheck levels in 6 months.    Dry skin dermatitis -Appears to have component of dry skin dermatitis, improved since new combination of meds from dermatology.  Maintain hydration, continue same treatment for now.      No orders of the defined  types were placed in this encounter.  Patient Instructions   I'm glad to hear that the skin issue has improved.   Keep appointment with urology to evaluate the prostate, but as urination has improved ok to hold off taking the tamsulosin for now.   I will recheck the kidney test today. If stable, can repeat testing in 6 months. Make sure to stay hydrated, and follow up with urology to make sure the prostate is not contributing to kidney function.   Thank you for coming in today.   IF you received an x-ray today, you will receive an invoice from [facility]. Please contact [facility] at [phone] with questions or concerns regarding your invoice.   IF you received labwork today, you will receive an invoice from [facility]. Please contact [facility] at [phone] with questions or concerns regarding your invoice.   Our billing staff will not be able to assist you with questions regarding bills from these companies.  You will be contacted with the lab results as soon as they are available. The fastest way to get your results is to activate your My Chart account. Instructions are located on the last page of this paperwork. If you have not heard from [place] regarding the results in 2 weeks, please contact this office.      I personally performed the services described in this documentation, which was scribed in my presence. The recorded information has been reviewed and considered for accuracy and completeness, addended by me as needed, and agree with information above.  Signed,   [name], MD [facility] at [facility].  06/06/17 11:07 AM

## 2017-06-07 LAB — BASIC METABOLIC PANEL
BUN/Creatinine Ratio: 8 — ABNORMAL LOW (ref 10–24)
BUN: 10 mg/dL (ref 8–27)
CHLORIDE: 102 mmol/L (ref 96–106)
CO2: 22 mmol/L (ref 20–29)
CREATININE: 1.2 mg/dL (ref 0.76–1.27)
Calcium: 9.3 mg/dL (ref 8.6–10.2)
GFR calc Af Amer: 69 mL/min/{1.73_m2} (ref >59)
GFR calc non Af Amer: 60 mL/min/{1.73_m2} (ref >59)
GLUCOSE: 101 mg/dL — AB (ref 65–99)
POTASSIUM: 4.6 mmol/L (ref 3.5–5.2)
SODIUM: 137 mmol/L (ref 134–144)

## 2017-07-16 DIAGNOSIS — N401 Enlarged prostate with lower urinary tract symptoms: Secondary | ICD-10-CM | POA: Diagnosis not present

## 2017-07-16 DIAGNOSIS — R350 Frequency of micturition: Secondary | ICD-10-CM | POA: Diagnosis not present

## 2017-08-06 ENCOUNTER — Telehealth: Payer: Self-pay | Admitting: *Deleted

## 2017-08-06 NOTE — Telephone Encounter (Signed)
Pt. Advised to stay ON aspirin per Dr. Blanch Media advice.  He verbalized understanding.

## 2017-08-06 NOTE — Telephone Encounter (Signed)
Called pt. To remind him to stop Plavix 5 days prior to colonoscopy 08/13/17.  He states he is aware of this plan.  He is not sure whether or not he was told to stop 81mg . Aspirin as well. Can you advise?

## 2017-08-06 NOTE — Telephone Encounter (Signed)
He needs to stay ON daily aspirin. Thank you

## 2017-08-13 ENCOUNTER — Encounter: Payer: Self-pay | Admitting: Internal Medicine

## 2017-08-13 ENCOUNTER — Ambulatory Visit (AMBULATORY_SURGERY_CENTER): Payer: Medicare Other | Admitting: Internal Medicine

## 2017-08-13 VITALS — BP 125/61 | HR 56 | Temp 99.8°F | Resp 11 | Ht 72.0 in | Wt 213.0 lb

## 2017-08-13 DIAGNOSIS — Z86010 Personal history of colonic polyps: Secondary | ICD-10-CM | POA: Diagnosis not present

## 2017-08-13 DIAGNOSIS — I2510 Atherosclerotic heart disease of native coronary artery without angina pectoris: Secondary | ICD-10-CM | POA: Diagnosis not present

## 2017-08-13 DIAGNOSIS — I10 Essential (primary) hypertension: Secondary | ICD-10-CM | POA: Diagnosis not present

## 2017-08-13 MED ORDER — SODIUM CHLORIDE 0.9 % IV SOLN: 500.0000 mL | Freq: Once | INTRAVENOUS | Status: DC

## 2017-08-13 NOTE — Op Note (Signed)
Seth [name] Patient Name: Seth [name] Procedure Date: 08/13/2017 9:35 AM MRN: [id number] Endoscopist: Docia Chuck. Henrene Pastor , MD Age: 72 Referring MD:  Date of Birth: 07-Apr-1945 Gender: Male Account #: [id number] Procedure:                Colonoscopy Indications:              High risk colon cancer surveillance: Personal                            history of adenoma (10 mm or greater in size), High                            risk colon cancer surveillance: Personal history of                            multiple (3 or more) adenomas. Previous examination                            September 2015 Medicines:                Monitored Anesthesia Care Procedure:                Pre-Anesthesia Assessment:                           - Prior to the procedure, a History and Physical                            was performed, and patient medications and                            allergies were reviewed. The patient's tolerance of                            previous anesthesia was also reviewed. The risks                            and benefits of the procedure and the sedation                            options and risks were discussed with the patient.                            All questions were answered, and informed consent                            was obtained. Prior Anticoagulants: The patient has                            taken Plavix (clopidogrel), last dose was 5 days                            prior to procedure. ASA Grade Assessment: III - A  patient with severe systemic disease. After                            reviewing the risks and benefits, the patient was                            deemed in satisfactory condition to undergo the                            procedure.                           After obtaining informed consent, the colonoscope                            was passed under direct vision. Throughout the                            procedure,  the patient's blood pressure, pulse, and                            oxygen saturations were monitored continuously. The                            Model CF-HQ190L [id number]) scope was introduced                            through the anus and advanced to the the cecum,                            identified by appendiceal orifice and ileocecal                            valve. The ileocecal valve, appendiceal orifice,                            and rectum were photographed. The quality of the                            bowel preparation was good. The colonoscopy was                            performed without difficulty. The patient tolerated                            the procedure well. The bowel preparation used was                            SUPREP. Scope In: 9:41:47 AM Scope Out: 9:56:25 AM Scope Withdrawal Time: 0 hours 10 minutes 0 seconds  Total Procedure Duration: 0 hours 14 minutes 38 seconds  Findings:                 The entire examined colon appeared normal on direct  and retroflexion views. Complications:            No immediate complications. Estimated blood loss:                            None. Estimated Blood Loss:     Estimated blood loss: none. Impression:               - The entire examined colon is normal on direct and                            retroflexion views.                           - No specimens collected. Recommendation:           - Repeat colonoscopy in 5 years for surveillance.                           - Patient has a contact number available for                            emergencies. The signs and symptoms of potential                            delayed complications were discussed with the                            patient. Return to normal activities tomorrow.                            Written discharge instructions were provided to the                            patient.                           - Resume previous  diet.                           - Continue present medications.                           - Resume Plavix today  N. Henrene Pastor, MD 08/13/2017 9:59:50 AM This report has been signed electronically.

## 2017-08-13 NOTE — Patient Instructions (Signed)
Resume Plavix today.     YOU HAD AN ENDOSCOPIC PROCEDURE TODAY AT [facility]:   Refer to the procedure report that was given to you for any specific questions about what was found during the examination.  If the procedure report does not answer your questions, please call your gastroenterologist to clarify.  If you requested that your care partner not be given the details of your procedure findings, then the procedure report has been included in a sealed envelope for you to review at your convenience later.  YOU SHOULD EXPECT: Some feelings of bloating in the abdomen. Passage of more gas than usual.  Walking can help get rid of the air that was put into your GI tract during the procedure and reduce the bloating. If you had a lower endoscopy (such as a colonoscopy or flexible sigmoidoscopy) you may notice spotting of blood in your stool or on the toilet paper. If you underwent a bowel prep for your procedure, you may not have a normal bowel movement for a few days.  Please Note:  You might notice some irritation and congestion in your nose or some drainage.  This is from the oxygen used during your procedure.  There is no need for concern and it should clear up in a day or so.  SYMPTOMS TO REPORT IMMEDIATELY:   Following lower endoscopy (colonoscopy or flexible sigmoidoscopy):  Excessive amounts of blood in the stool  Significant tenderness or worsening of abdominal pains  Swelling of the abdomen that is new, acute  Fever of 100F or higher   For urgent or emergent issues, a gastroenterologist can be reached at any hour by calling [phone].   DIET:  We do recommend a small meal at first, but then you may proceed to your regular diet.  Drink plenty of fluids but you should avoid alcoholic beverages for 24 hours.  ACTIVITY:  You should plan to take it easy for the rest of today and you should NOT DRIVE or use heavy machinery until tomorrow (because of the sedation  medicines used during the test).    FOLLOW UP: Our staff will call the number listed on your records the next business day following your procedure to check on you and address any questions or concerns that you may have regarding the information given to you following your procedure. If we do not reach you, we will leave a message.  However, if you are feeling well and you are not experiencing any problems, there is no need to return our call.  We will assume that you have returned to your regular daily activities without incident.  If any biopsies were taken you will be contacted by phone or by letter within the next 1-3 weeks.  Please call us at [phone] if you have not heard about the biopsies in 3 weeks.    SIGNATURES/CONFIDENTIALITY: You and/or your care partner have signed paperwork which will be entered into your electronic medical record.  These signatures attest to the fact that that the information above on your After Visit Summary has been reviewed and is understood.  Full responsibility of the confidentiality of this discharge information lies with you and/or your care-partner.

## 2017-08-13 NOTE — Progress Notes (Signed)
Report to RN, VSS, adequate respirations noted, no c/o pain or discomfort 

## 2017-08-14 ENCOUNTER — Telehealth: Payer: Self-pay | Admitting: *Deleted

## 2017-08-14 NOTE — Telephone Encounter (Signed)
  Follow up Call-  Call back number 08/13/2017  Post procedure Call Back phone  # [phone]  Permission to leave phone message Yes  Some recent data might be hidden     Patient questions:  Do you have a fever, pain , or abdominal swelling? No. Pain Score  0 *  Have you tolerated food without any problems? Yes.    Have you been able to return to your normal activities? Yes.    Do you have any questions about your discharge instructions: Diet   No. Medications  No. Follow up visit  No.  Do you have questions or concerns about your Care? No.  Actions: * If pain score is 4 or above: No action needed, pain <4.

## 2017-08-16 DIAGNOSIS — N401 Enlarged prostate with lower urinary tract symptoms: Secondary | ICD-10-CM | POA: Diagnosis not present

## 2017-08-16 DIAGNOSIS — R350 Frequency of micturition: Secondary | ICD-10-CM | POA: Diagnosis not present

## 2017-08-19 ENCOUNTER — Encounter: Payer: Self-pay | Admitting: Family Medicine

## 2017-08-19 ENCOUNTER — Other Ambulatory Visit: Payer: Self-pay

## 2017-08-19 ENCOUNTER — Ambulatory Visit (INDEPENDENT_AMBULATORY_CARE_PROVIDER_SITE_OTHER): Payer: Medicare Other | Admitting: Family Medicine

## 2017-08-19 VITALS — BP 156/78 | HR 73 | Temp 98.1°F | Ht 72.0 in | Wt 208.8 lb

## 2017-08-19 DIAGNOSIS — L03115 Cellulitis of right lower limb: Secondary | ICD-10-CM

## 2017-08-19 DIAGNOSIS — Z8669 Personal history of other diseases of the nervous system and sense organs: Secondary | ICD-10-CM

## 2017-08-19 DIAGNOSIS — M25561 Pain in right knee: Secondary | ICD-10-CM

## 2017-08-19 DIAGNOSIS — L853 Xerosis cutis: Secondary | ICD-10-CM

## 2017-08-19 MED ORDER — CEPHALEXIN 500 MG PO CAPS: 500.0000 mg | ORAL_CAPSULE | Freq: Two times a day (BID) | ORAL | 0 refills | Status: DC

## 2017-08-19 NOTE — Progress Notes (Signed)
Subjective:  By signing my name below, I, [name], attest that this documentation has been prepared under the direction and in the presence of [name], MD. Electronically Signed: [name], [place]. 08/19/2017 , 10:01 AM .  Patient was seen in Room 11 .   Patient ID: Seth [name], male    DOB: [birth year], 72 y.o.   MRN: [id number] Chief Complaint  Patient presents with  . right leg    red, blochy, and warm to touch rt leg. had edema in both legs yesterday night. Stopped blood thinner since colonoscopy   HPI Seth [name] is a 72 y.o. male  He has a history of multiple medical problems including peripheral vascular disease, CAD and abdominal aortic aneurysm. He recently underwent colonoscopy on July 9th. His Plavix was stopped 5 days prior to colonoscopy, but continued on daily aspirin. He presents today with right leg rash and lower leg swelling bilaterally.   Patient states he noticed lower extremities swelling yesterday, worse in right leg. He describes the right lower leg pain from his knee down. He had some problems walking since yesterday. He informs his leg pain and swelling was worse this morning. He hasn't noticed any swelling or redness prior to yesterday. He has started back on Plavix after colonoscopy, and has been on aspirin continuously as well. He denies blood clots in his calves. He denies chest pain, or shortness of breath. He noticed swelling yesterday with sock indentation that didn't resolve even elevating his legs for 30 minutes. He denies any long distance car travel or air travel recently. His feet are usually numb, with purple hue in appearance. He noticed a swollen lump over his right lateral leg. He mentions his legs were itchy prior to yesterday, but hasn't been scratching after the swelling started. He's noticed some redness last night as well, but subsided after elevation. He also mentions eating a lot of potato chips yesterday. He denies smoking in over a  year. He also notes low back pain from his buttocks.   Patient Active Problem List   Diagnosis Date Noted  . PVD (peripheral vascular disease) ([place]) 10/15/2013  . Tobacco use disorder 10/15/2013  . Pure hypercholesterolemia 10/15/2013  . AAA (abdominal aortic aneurysm) ([place]) 10/15/2013  . Coronary artery disease involving native coronary artery of native heart without angina pectoris 10/15/2013  . Cataract 09/29/2013  . Colon cancer screening 09/25/2013  . AAA (abdominal aortic aneurysm) without rupture ([place]) 10/22/2012  . Nicotine addiction 10/17/2012  . Atherosclerosis of native arteries of the extremities with ulceration(440.23) 10/17/2012  . Carotid bruit 10/17/2012  . Other nonspecific abnormal cardiovascular system function study 04/10/2012  . History of sciatica   . Essential hypertension, benign   . Seasonal allergies   . HTN (hypertension) 04/04/2011  . Abdominal aortic aneurysm ([place]) 11/03/2010   Past Medical History:  Diagnosis Date  . AAA (abdominal aortic aneurysm) ([place])   . Allergic rhinitis   . Anxiety   . Back pain   . CAD (coronary artery disease)   . Essential hypertension, benign   . Gout   . History of sciatica   . Hyperlipidemia   . Prostate hypertrophy   . PVD (peripheral vascular disease) ([place])   . Seasonal allergies    Past Surgical History:  Procedure Laterality Date  . CARDIAC CATHETERIZATION     stent placement  . ILIAC ARTERY STENT     Allergies  Allergen Reactions  . Biaxin [Clarithromycin] Shortness Of Breath  . Clarithromycin Shortness  Of Breath  . Iodinated Diagnostic Agents Hives  . Cortisone Other (See Comments)    Turns red from breast up  . Flomax [Tamsulosin Hcl]     Blurred vision and lower back pain   . Iodine Hives    Dye from nuclear medicine test  . Prednisone    Prior to Admission medications   Medication Sig Start Date End Date Taking? Authorizing Provider  aspirin EC 81 MG tablet Take 81 mg by mouth daily.   Yes  [provider]  atorvastatin (LIPITOR) 20 MG tablet Take 1 tablet (20 mg total) by mouth daily at 6 PM. 10/22/16  Yes [name], MD  cetirizine (ZYRTEC) 10 MG tablet Take 10 mg by mouth daily.   Yes [provider]  clobetasol cream (TEMOVATE) 0.05 %  05/20/17  Yes [provider]  clopidogrel (PLAVIX) 75 MG tablet Take 1 tablet (75 mg total) by mouth daily. 04/23/17  Yes [name], MD  clotrimazole (LOTRIMIN) 1 % cream Apply 1 application topically 2 (two) times daily. To affected area on groin. Use for 2 weeks, or at least 2 days after resolution of rash 10/23/16  Yes [name], MD  lisinopril (PRINIVIL,ZESTRIL) 10 MG tablet Take 1 tablet (10 mg total) by mouth daily. 04/23/17  Yes [name], MD  lisinopril (PRINIVIL,ZESTRIL) 5 MG tablet TAKE 1 TABLET (5 MG TOTAL) BY MOUTH DAILY. 04/23/17  Yes [name], MD  metoprolol tartrate (LOPRESSOR) 50 MG tablet Take 1.5 tablets (75 mg total) by mouth 2 (two) times daily. 04/23/17  Yes [name], MD  ranitidine (ZANTAC) 75 MG tablet Take 75 mg by mouth 2 (two) times daily.   Yes [provider]  SALINE NA Place into the nose as needed.   Yes [provider]  triamcinolone cream (KENALOG) 0.1 % Apply 1 application topically 2 (two) times daily. 04/25/17  Yes [name], MD   Social History   Socioeconomic History  . Marital status: Married    Spouse name: Not on file  . Number of children: 1  . Years of education: Not on file  . Highest education level: Not on file  Occupational History  . Occupation: retired  Scientific laboratory technician  . Financial resource strain: Not hard at all  . Food insecurity:    Worry: Never true    Inability: Never true  . Transportation needs:    Medical: No    Non-medical: No  Tobacco Use  . Smoking status: Former Smoker    Packs/day: 3.00    Years: 50.00    Pack years: 150.00    Types: Cigarettes    Last attempt to quit: 04/05/2016      Years since quitting: 1.3  . Smokeless tobacco: Never Used  Substance and Sexual Activity  . Alcohol use: No    Alcohol/week: 0.0 oz    Comment: sometimes mixed drinks or beers  . Drug use: No  . Sexual activity: Not on file  Lifestyle  . Physical activity:    Days per week: 0 days    Minutes per session: 0 min  . Stress: Not at all  Relationships  . Social connections:    Talks on phone: More than three times a week    Gets together: More than three times a week    Attends religious service: Never    Active member of club or organization: No    Attends meetings of clubs or organizations: Never  Relationship status: Married  . Intimate partner violence:    Fear of current or ex partner: No    Emotionally abused: No    Physically abused: No    Forced sexual activity: No  Other Topics Concern  . Not on file  Social History Narrative   Married   Exercise: No   Education: GED   Review of Systems  Constitutional: Negative for fatigue and unexpected weight change.  Eyes: Negative for visual disturbance.  Respiratory: Negative for cough, chest tightness and shortness of breath.   Cardiovascular: Positive for leg swelling. Negative for chest pain and palpitations.  Gastrointestinal: Negative for abdominal pain and blood in stool.  Skin: Negative for rash.  Neurological: Negative for dizziness, weakness, light-headedness, numbness and headaches.       Objective:   Physical Exam  Constitutional: He is oriented to person, place, and time. He appears well-developed and well-nourished.  HENT:  Head: Normocephalic and atraumatic.  Eyes: Pupils are equal, round, and reactive to light. EOM are normal.  Neck: No JVD present. Carotid bruit is not present.  Cardiovascular: Normal rate, regular rhythm and normal heart sounds.  No murmur heard. Pulmonary/Chest: Effort normal and breath sounds normal. He has no rales.  Musculoskeletal: He exhibits no edema.  Left leg: no  appreciable edema, calf non tender, negative homan's on the left Right leg: no appreciable edema into lower right leg, some tenderness along lower calf posteriorly, no cords palpated, some excoriated areas on the leg with slight warmth and minimal erythema, slight increased warmth on the right leg versus the left leg; some dry skin throughout lower extremities, and scaling in feet; negative homan's on the right but slight tightness Calf circumference, 15 cm below patella: 37 cm (right side) Calf circumference, 15 cm below patella: 37 cm (left side)  Neurological: He is alert and oriented to person, place, and time.  Skin: Skin is warm and dry.  Lower extremities: slightly cyanotic appearance to the toes, cap refill 1-2 seconds, feet are warm; difficulty palpating DP pulse  Psychiatric: He has a normal mood and affect.  Vitals reviewed.   Vitals:   08/19/17 0914 08/19/17 0926  BP: (!) 162/90 (!) 156/78  Pulse: 73   Temp: 98.1 F (36.7 C)   TempSrc: Oral   SpO2: 94%   Weight: 208 lb 12.8 oz (94.7 kg)   Height: 6' (1.829 m)        Assessment & Plan:    Seth [name] is a 71 y.o. male Arthralgia of right lower leg  Cellulitis of leg, right - Plan: cephALEXin (KEFLEX) 500 MG capsule  Dry skin dermatitis  History of sciatica  History of peripheral vascular disease, reported bilateral lower extremity swelling, but may have been due to diet and increased sodium intake.  That swelling has now improved.  Redness of right leg which is also somewhat improved may have been cellulitis from excoriated areas secondary to dry skin dermatitis.  Equal calf circumference, no known risk factors for DVT.  History of sciatica, may also be contributing to radiating pain down the right leg.  -Start Keflex 500 mg twice daily, potential side effects discussed.  -Eucerin or other hydrating lotion after cleansing area with soap and water 2 times per day, and Eucerin/lotion throughout the day to help with dry  skin  -Recheck 72 hours, sooner if any increased swelling, calf pain.  If persistent radiating pain at that time, can discuss further treatment/work-up of sciatica and can evaluate for ultrasound/imaging at  that time as well if needed.  Meds ordered this encounter  Medications  . cephALEXin (KEFLEX) 500 MG capsule    Sig: Take 1 capsule (500 mg total) by mouth 2 (two) times daily.    Dispense:  20 capsule    Refill:  0   Patient Instructions   I am glad to hear that the swelling has improved.  Because of swelling was on both legs, that may have been related to sodium or salt intake.  See information below on peripheral edema.  Start keflex for possible mild cellulitis of the right lower leg.  That may be related to the scratches that are seen along with dry skin as an entry source.  Cleanse area with soap and water twice per day for now, then use Eucerin or other hydrating lotion at least 2-3 times per day.  Recheck in 72 hours to determine if other testing needed.  As we discussed sciatica can cause radiating pain down the leg, but I think these may be 2 separate issues.   Return to the clinic or go to the nearest emergency room if any of your symptoms worsen or new symptoms occur.   Peripheral Edema Peripheral edema is swelling that is caused by a buildup of fluid. Peripheral edema most often affects the lower legs, ankles, and feet. It can also develop in the arms, hands, and face. The area of the body that has peripheral edema will look swollen. It may also feel heavy or warm. Your clothes may start to feel tight. Pressing on the area may make a temporary dent in your skin. You may not be able to move your arm or leg as much as usual. There are many causes of peripheral edema. It can be a complication of other diseases, such as congestive heart failure, kidney disease, or a problem with your blood circulation. It also can be a side effect of certain medicines. It often happens to women  during pregnancy. Sometimes, the cause is not known. Treating the underlying condition is often the only treatment for peripheral edema. Follow these instructions at home: Pay attention to any changes in your symptoms. Take these actions to help with your discomfort:  Raise (elevate) your legs while you are sitting or lying down.  Move around often to prevent stiffness and to lessen swelling. Do not sit or stand for long periods of time.  Wear support stockings as told by your health care provider.  Follow instructions from your health care provider about limiting salt (sodium) in your diet. Sometimes eating less salt can reduce swelling.  Take over-the-counter and prescription medicines only as told by your health care provider. Your health care provider may prescribe medicine to help your body get rid of excess water (diuretic).  Keep all follow-up visits as told by your health care provider. This is important.  Contact a health care provider if:  You have a fever.  Your edema starts suddenly or is getting worse, especially if you are pregnant or have a medical condition.  You have swelling in only one leg.  You have increased swelling and pain in your legs. Get help right away if:  You develop shortness of breath, especially when you are lying down.  You have pain in your chest or abdomen.  You feel weak.  You faint. This information is not intended to replace advice given to you by your health care provider. Make sure you discuss any questions you have with your health care provider.  Document Released: 03/01/2004 Document Revised: 06/27/2015 Document Reviewed: 08/04/2014 Elsevier Interactive Patient Education  2018 [employer].  Cellulitis, Adult Cellulitis is a skin infection. The infected area is usually red and tender. This condition occurs most often in the arms and lower legs. The infection can travel to the muscles, blood, and underlying tissue and become serious.  It is very important to get treated for this condition. What are the causes? Cellulitis is caused by bacteria. The bacteria enter through a break in the skin, such as a cut, burn, insect bite, open sore, or crack. What increases the risk? This condition is more likely to occur in people who:  Have a weak defense system (immune system).  Have open wounds on the skin such as cuts, burns, bites, and scrapes. Bacteria can enter the body through these open wounds.  Are older.  Have diabetes.  Have a type of long-lasting (chronic) liver disease (cirrhosis) or kidney disease.  Use IV drugs.  What are the signs or symptoms? Symptoms of this condition include:  Redness, streaking, or spotting on the skin.  Swollen area of the skin.  Tenderness or pain when an area of the skin is touched.  Warm skin.  Fever.  Chills.  Blisters.  How is this diagnosed? This condition is diagnosed based on a medical history and physical exam. You may also have tests, including:  Blood tests.  Lab tests.  Imaging tests.  How is this treated? Treatment for this condition may include:  Medicines, such as antibiotic medicines or antihistamines.  Supportive care, such as rest and application of cold or warm cloths (cold or warm compresses) to the skin.  Hospital care, if the condition is severe.  The infection usually gets better within 1-2 days of treatment. Follow these instructions at home:  Take over-the-counter and prescription medicines only as told by your health care provider.  If you were prescribed an antibiotic medicine, take it as told by your health care provider. Do not stop taking the antibiotic even if you start to feel better.  Drink enough fluid to keep your urine clear or pale yellow.  Do not touch or rub the infected area.  Raise (elevate) the infected area above the level of your heart while you are sitting or lying down.  Apply warm or cold compresses to the  affected area as told by your health care provider.  Keep all follow-up visits as told by your health care provider. This is important. These visits let your health care provider make sure a more serious infection is not developing. Contact a health care provider if:  You have a fever.  Your symptoms do not improve within 1-2 days of starting treatment.  Your bone or joint underneath the infected area becomes painful after the skin has healed.  Your infection returns in the same area or another area.  You notice a swollen bump in the infected area.  You develop new symptoms.  You have a general ill feeling (malaise) with muscle aches and pains. Get help right away if:  Your symptoms get worse.  You feel very sleepy.  You develop vomiting or diarrhea that persists.  You notice red streaks coming from the infected area.  Your red area gets larger or turns dark in color. This information is not intended to replace advice given to you by your health care provider. Make sure you discuss any questions you have with your health care provider. Document Released: 11/01/2004 Document Revised: 06/02/2015 Document Reviewed:  12/01/2014 Elsevier Interactive Patient Education  2018 [employer].     IF you received an x-ray today, you will receive an invoice from [facility]. Please contact [facility] at [phone] with questions or concerns regarding your invoice.   IF you received labwork today, you will receive an invoice from [facility]. Please contact [facility] at [phone] with questions or concerns regarding your invoice.   Our billing staff will not be able to assist you with questions regarding bills from these companies.  You will be contacted with the lab results as soon as they are available. The fastest way to get your results is to activate your My Chart account. Instructions are located on the last page of this paperwork. If you have not heard from [place]  regarding the results in 2 weeks, please contact this office.       I personally performed the services described in this documentation, which was scribed in my presence. The recorded information has been reviewed and considered for accuracy and completeness, addended by me as needed, and agree with information above.  Signed,   [name], MD [facility].  08/19/17 10:07 AM

## 2017-08-19 NOTE — Patient Instructions (Addendum)
I am glad to hear that the swelling has improved.  Because of swelling was on both legs, that may have been related to sodium or salt intake.  See information below on peripheral edema.  Start keflex for possible mild cellulitis of the right lower leg.  That may be related to the scratches that are seen along with dry skin as an entry source.  Cleanse area with soap and water twice per day for now, then use Eucerin or other hydrating lotion at least 2-3 times per day.  Recheck in 72 hours to determine if other testing needed.  As we discussed sciatica can cause radiating pain down the leg, but I think these may be 2 separate issues.   Return to the clinic or go to the nearest emergency room if any of your symptoms worsen or new symptoms occur.   Peripheral Edema Peripheral edema is swelling that is caused by a buildup of fluid. Peripheral edema most often affects the lower legs, ankles, and feet. It can also develop in the arms, hands, and face. The area of the body that has peripheral edema will look swollen. It may also feel heavy or warm. Your clothes may start to feel tight. Pressing on the area may make a temporary dent in your skin. You may not be able to move your arm or leg as much as usual. There are many causes of peripheral edema. It can be a complication of other diseases, such as congestive heart failure, kidney disease, or a problem with your blood circulation. It also can be a side effect of certain medicines. It often happens to women during pregnancy. Sometimes, the cause is not known. Treating the underlying condition is often the only treatment for peripheral edema. Follow these instructions at home: Pay attention to any changes in your symptoms. Take these actions to help with your discomfort:  Raise (elevate) your legs while you are sitting or lying down.  Move around often to prevent stiffness and to lessen swelling. Do not sit or stand for long periods of time.  Wear support  stockings as told by your health care provider.  Follow instructions from your health care provider about limiting salt (sodium) in your diet. Sometimes eating less salt can reduce swelling.  Take over-the-counter and prescription medicines only as told by your health care provider. Your health care provider may prescribe medicine to help your body get rid of excess water (diuretic).  Keep all follow-up visits as told by your health care provider. This is important.  Contact a health care provider if:  You have a fever.  Your edema starts suddenly or is getting worse, especially if you are pregnant or have a medical condition.  You have swelling in only one leg.  You have increased swelling and pain in your legs. Get help right away if:  You develop shortness of breath, especially when you are lying down.  You have pain in your chest or abdomen.  You feel weak.  You faint. This information is not intended to replace advice given to you by your health care provider. Make sure you discuss any questions you have with your health care provider. Document Released: 03/01/2004 Document Revised: 06/27/2015 Document Reviewed: 08/04/2014 Elsevier Interactive Patient Education  2018 [employer].  Cellulitis, Adult Cellulitis is a skin infection. The infected area is usually red and tender. This condition occurs most often in the arms and lower legs. The infection can travel to the muscles, blood, and underlying tissue  and become serious. It is very important to get treated for this condition. What are the causes? Cellulitis is caused by bacteria. The bacteria enter through a break in the skin, such as a cut, burn, insect bite, open sore, or crack. What increases the risk? This condition is more likely to occur in people who:  Have a weak defense system (immune system).  Have open wounds on the skin such as cuts, burns, bites, and scrapes. Bacteria can enter the body through these open  wounds.  Are older.  Have diabetes.  Have a type of long-lasting (chronic) liver disease (cirrhosis) or kidney disease.  Use IV drugs.  What are the signs or symptoms? Symptoms of this condition include:  Redness, streaking, or spotting on the skin.  Swollen area of the skin.  Tenderness or pain when an area of the skin is touched.  Warm skin.  Fever.  Chills.  Blisters.  How is this diagnosed? This condition is diagnosed based on a medical history and physical exam. You may also have tests, including:  Blood tests.  Lab tests.  Imaging tests.  How is this treated? Treatment for this condition may include:  Medicines, such as antibiotic medicines or antihistamines.  Supportive care, such as rest and application of cold or warm cloths (cold or warm compresses) to the skin.  Hospital care, if the condition is severe.  The infection usually gets better within 1-2 days of treatment. Follow these instructions at home:  Take over-the-counter and prescription medicines only as told by your health care provider.  If you were prescribed an antibiotic medicine, take it as told by your health care provider. Do not stop taking the antibiotic even if you start to feel better.  Drink enough fluid to keep your urine clear or pale yellow.  Do not touch or rub the infected area.  Raise (elevate) the infected area above the level of your heart while you are sitting or lying down.  Apply warm or cold compresses to the affected area as told by your health care provider.  Keep all follow-up visits as told by your health care provider. This is important. These visits let your health care provider make sure a more serious infection is not developing. Contact a health care provider if:  You have a fever.  Your symptoms do not improve within 1-2 days of starting treatment.  Your bone or joint underneath the infected area becomes painful after the skin has healed.  Your  infection returns in the same area or another area.  You notice a swollen bump in the infected area.  You develop new symptoms.  You have a general ill feeling (malaise) with muscle aches and pains. Get help right away if:  Your symptoms get worse.  You feel very sleepy.  You develop vomiting or diarrhea that persists.  You notice red streaks coming from the infected area.  Your red area gets larger or turns dark in color. This information is not intended to replace advice given to you by your health care provider. Make sure you discuss any questions you have with your health care provider. Document Released: 11/01/2004 Document Revised: 06/02/2015 Document Reviewed: 12/01/2014 Elsevier Interactive Patient Education  2018 [employer].     IF you received an x-ray today, you will receive an invoice from [facility]. Please contact [facility] at [phone] with questions or concerns regarding your invoice.   IF you received labwork today, you will receive an invoice from [place]. Please  contact [facility] at [phone] with questions or concerns regarding your invoice.   Our billing staff will not be able to assist you with questions regarding bills from these companies.  You will be contacted with the lab results as soon as they are available. The fastest way to get your results is to activate your My Chart account. Instructions are located on the last page of this paperwork. If you have not heard from [place] regarding the results in 2 weeks, please contact this office.

## 2017-08-22 ENCOUNTER — Ambulatory Visit (INDEPENDENT_AMBULATORY_CARE_PROVIDER_SITE_OTHER): Payer: Medicare Other | Admitting: Family Medicine

## 2017-08-22 ENCOUNTER — Encounter: Payer: Self-pay | Admitting: Family Medicine

## 2017-08-22 ENCOUNTER — Other Ambulatory Visit: Payer: Self-pay

## 2017-08-22 VITALS — BP 134/80 | HR 68 | Temp 98.0°F | Ht 72.0 in | Wt 208.2 lb

## 2017-08-22 DIAGNOSIS — L03115 Cellulitis of right lower limb: Secondary | ICD-10-CM | POA: Diagnosis not present

## 2017-08-22 DIAGNOSIS — I872 Venous insufficiency (chronic) (peripheral): Secondary | ICD-10-CM | POA: Diagnosis not present

## 2017-08-22 NOTE — Progress Notes (Signed)
Subjective:    Patient ID: Seth [name], male    DOB: October 26, 1945, 72 y.o.   MRN: [id number]  HPI Seth [name] is a 72 y.o. male Presents today for: Chief Complaint  Patient presents with  . both lower legs    follow up   Here for follow-up of lower leg pain, suspected cellulitis with underlying dry skin dermatitis, possible component of sciatica as well.  He was started on Keflex 500 mg twice daily 3 days ago, recommended Eucerin and lotion to dry skin.  Increased peripheral edema may have been due to increased sodium intake.  Swelling had reportedly improved at last visit.  He had equal calf circumference, without known risk factors for DVT at that time.  Reports improved leg symptoms.  Feels like swelling has improved. Pain seems to be  better with antibiotic.  Feels like less red. No fever, no chest pain, dyspnea or new calf swelling. No change in feeling of the toes. Has used Eucerin for dry skin.  Less radiating pain in leg. That has improved as well.   Patient Active Problem List   Diagnosis Date Noted  . PVD (peripheral vascular disease) ([place]) 10/15/2013  . Tobacco use disorder 10/15/2013  . Pure hypercholesterolemia 10/15/2013  . AAA (abdominal aortic aneurysm) ([place]) 10/15/2013  . Coronary artery disease involving native coronary artery of native heart without angina pectoris 10/15/2013  . Cataract 09/29/2013  . Colon cancer screening 09/25/2013  . AAA (abdominal aortic aneurysm) without rupture ([place]) 10/22/2012  . Nicotine addiction 10/17/2012  . Atherosclerosis of native arteries of the extremities with ulceration(440.23) 10/17/2012  . Carotid bruit 10/17/2012  . Other nonspecific abnormal cardiovascular system function study 04/10/2012  . History of sciatica   . Essential hypertension, benign   . Seasonal allergies   . HTN (hypertension) 04/04/2011  . Abdominal aortic aneurysm ([place]) 11/03/2010   Past Medical History:  Diagnosis Date  . AAA (abdominal aortic aneurysm)  ([place])   . Allergic rhinitis   . Anxiety   . Back pain   . CAD (coronary artery disease)   . Essential hypertension, benign   . Gout   . History of sciatica   . Hyperlipidemia   . Prostate hypertrophy   . PVD (peripheral vascular disease) ([place])   . Seasonal allergies    Past Surgical History:  Procedure Laterality Date  . CARDIAC CATHETERIZATION     stent placement  . ILIAC ARTERY STENT     Allergies  Allergen Reactions  . Biaxin [Clarithromycin] Shortness Of Breath  . Clarithromycin Shortness Of Breath  . Iodinated Diagnostic Agents Hives  . Cortisone Other (See Comments)    Turns red from breast up  . Flomax [Tamsulosin Hcl]     Blurred vision and lower back pain   . Iodine Hives    Dye from nuclear medicine test  . Prednisone    Prior to Admission medications   Medication Sig Start Date End Date Taking? Authorizing Provider  aspirin EC 81 MG tablet Take 81 mg by mouth daily.   Yes [provider]  atorvastatin (LIPITOR) 20 MG tablet Take 1 tablet (20 mg total) by mouth daily at 6 PM. 10/22/16  Yes [name], MD  cephALEXin (KEFLEX) 500 MG capsule Take 1 capsule (500 mg total) by mouth 2 (two) times daily. 08/19/17  Yes [name], MD  cetirizine (ZYRTEC) 10 MG tablet Take 10 mg by mouth daily.   Yes [provider]  clobetasol cream (TEMOVATE) 0.05 %  05/20/17  Yes [provider]  clopidogrel (PLAVIX) 75 MG tablet Take 1 tablet (75 mg total) by mouth daily. 04/23/17  Yes [name], MD  clotrimazole (LOTRIMIN) 1 % cream Apply 1 application topically 2 (two) times daily. To affected area on groin. Use for 2 weeks, or at least 2 days after resolution of rash 10/23/16  Yes [name], MD  lisinopril (PRINIVIL,ZESTRIL) 10 MG tablet Take 1 tablet (10 mg total) by mouth daily. 04/23/17  Yes [name], MD  lisinopril (PRINIVIL,ZESTRIL) 5 MG tablet TAKE 1 TABLET (5 MG TOTAL) BY MOUTH DAILY. 04/23/17  Yes [name],  MD  metoprolol tartrate (LOPRESSOR) 50 MG tablet Take 1.5 tablets (75 mg total) by mouth 2 (two) times daily. 04/23/17  Yes [name], MD  ranitidine (ZANTAC) 75 MG tablet Take 75 mg by mouth 2 (two) times daily.   Yes [provider]  SALINE NA Place into the nose as needed.   Yes [provider]  triamcinolone cream (KENALOG) 0.1 % Apply 1 application topically 2 (two) times daily. 04/25/17  Yes [name], MD   Social History   Socioeconomic History  . Marital status: Married    Spouse name: Not on file  . Number of children: 1  . Years of education: Not on file  . Highest education level: Not on file  Occupational History  . Occupation: retired  Scientific laboratory technician  . Financial resource strain: Not hard at all  . Food insecurity:    Worry: Never true    Inability: Never true  . Transportation needs:    Medical: No    Non-medical: No  Tobacco Use  . Smoking status: Former Smoker    Packs/day: 3.00    Years: 50.00    Pack years: 150.00    Types: Cigarettes    Last attempt to quit: 04/05/2016    Years since quitting: 1.3  . Smokeless tobacco: Never Used  Substance and Sexual Activity  . Alcohol use: No    Alcohol/week: 0.0 oz    Comment: sometimes mixed drinks or beers  . Drug use: No  . Sexual activity: Not on file  Lifestyle  . Physical activity:    Days per week: 0 days    Minutes per session: 0 min  . Stress: Not at all  Relationships  . Social connections:    Talks on phone: More than three times a week    Gets together: More than three times a week    Attends religious service: Never    Active member of club or organization: No    Attends meetings of clubs or organizations: Never    Relationship status: Married  . Intimate partner violence:    Fear of current or ex partner: No    Emotionally abused: No    Physically abused: No    Forced sexual activity: No  Other Topics Concern  . Not on file  Social History Narrative   Married     Exercise: No   Education: GED    Review of Systems As above.     Objective:   Physical Exam  Constitutional: He is oriented to person, place, and time. He appears well-developed and well-nourished. No distress.  HENT:  Head: Normocephalic and atraumatic.  Cardiovascular: Normal rate.  Pulmonary/Chest: Effort normal.  Musculoskeletal: He exhibits no edema.  Neurological: He is alert and oriented to person, place, and time.  Skin: Skin is warm. Rash (Scattered excoriated  areas of right lower extremity without significant erythema.  No peripheral edema noted bilaterally.  Calves nontender.) noted.  Psychiatric: He has a normal mood and affect.  Vitals reviewed.  Vitals:   08/22/17 1101  BP: 134/80  Pulse: 68  Temp: 98 F (36.7 C)  TempSrc: Oral  SpO2: 96%  Weight: 208 lb 3.2 oz (94.4 kg)  Height: 6' (1.829 m)        Assessment & Plan:  Seth [name] is a 72 y.o. male Venous stasis dermatitis of right lower extremity  Cellulitis of leg, right  Improving cellulitis, tolerating antibiotics.  Dry skin dermatitis also improving which was likely initial source of cellulitis.  Continue Eucerin, continue Keflex, recheck 1 week.  Episodic possible sciatic symptoms, but that is also improved.  If persistent consider imaging/further work-up of sciatica.  Over-the-counter hydrocortisone or mild steroid cream as needed to help prevent itching.  At end of visit he did note a concern of possible growth or bump in his hand that has been there for years but may be increasing.  He plans to discuss this further at next appointment with possible imaging at that time if needed.  No orders of the defined types were placed in this encounter.  Patient Instructions   Although the swelling has improved and redness is improving, if the radiating leg pains continue or become more frequent I would recommend follow-up to discuss sciatica as a possible cause. Ok to use occasional steroid cream if  needed for itching. Continue antibiotic and eucerin for now. Recheck in 1 week.   Please follow up to discuss hand concern - we can talk about that at follow up in 1 week for your legs.    IF you received an x-ray today, you will receive an invoice from [facility]. Please contact [facility] at [phone] with questions or concerns regarding your invoice.   IF you received labwork today, you will receive an invoice from [place]. Please contact [facility] at [phone] with questions or concerns regarding your invoice.   Our billing staff will not be able to assist you with questions regarding bills from these companies.  You will be contacted with the lab results as soon as they are available. The fastest way to get your results is to activate your My Chart account. Instructions are located on the last page of this paperwork. If you have not heard from [place] regarding the results in 2 weeks, please contact this office.       Signed,   [name], MD [facility] [place].  08/22/17 11:51 AM

## 2017-08-22 NOTE — Patient Instructions (Addendum)
Although the swelling has improved and redness is improving, if the radiating leg pains continue or become more frequent I would recommend follow-up to discuss sciatica as a possible cause. Ok to use occasional steroid cream if needed for itching. Continue antibiotic and eucerin for now. Recheck in 1 week.   Please follow up to discuss hand concern - we can talk about that at follow up in 1 week for your legs.    IF you received an x-ray today, you will receive an invoice from [facility]. Please contact [facility] Radiology at [phone] with questions or concerns regarding your invoice.   IF you received labwork today, you will receive an invoice from [place]. Please contact [facility] at [phone] with questions or concerns regarding your invoice.   Our billing staff will not be able to assist you with questions regarding bills from these companies.  You will be contacted with the lab results as soon as they are available. The fastest way to get your results is to activate your My Chart account. Instructions are located on the last page of this paperwork. If you have not heard from [place] regarding the results in 2 weeks, please contact this office.

## 2017-08-23 ENCOUNTER — Encounter: Payer: Self-pay | Admitting: Family Medicine

## 2017-08-23 ENCOUNTER — Other Ambulatory Visit: Payer: Self-pay | Admitting: Family Medicine

## 2017-08-23 DIAGNOSIS — E785 Hyperlipidemia, unspecified: Secondary | ICD-10-CM

## 2017-08-29 ENCOUNTER — Ambulatory Visit (INDEPENDENT_AMBULATORY_CARE_PROVIDER_SITE_OTHER): Payer: Medicare Other | Admitting: Family Medicine

## 2017-08-29 ENCOUNTER — Other Ambulatory Visit: Payer: Self-pay

## 2017-08-29 ENCOUNTER — Ambulatory Visit (INDEPENDENT_AMBULATORY_CARE_PROVIDER_SITE_OTHER): Payer: Medicare Other

## 2017-08-29 ENCOUNTER — Encounter: Payer: Self-pay | Admitting: Family Medicine

## 2017-08-29 VITALS — BP 130/74 | HR 75 | Temp 97.8°F | Ht 72.0 in | Wt 210.4 lb

## 2017-08-29 DIAGNOSIS — M24541 Contracture, right hand: Secondary | ICD-10-CM

## 2017-08-29 DIAGNOSIS — L853 Xerosis cutis: Secondary | ICD-10-CM | POA: Diagnosis not present

## 2017-08-29 DIAGNOSIS — R21 Rash and other nonspecific skin eruption: Secondary | ICD-10-CM | POA: Diagnosis not present

## 2017-08-29 DIAGNOSIS — M7989 Other specified soft tissue disorders: Secondary | ICD-10-CM | POA: Diagnosis not present

## 2017-08-29 MED ORDER — TRIAMCINOLONE ACETONIDE 0.1 % EX CREA: 1.0000 "application " | TOPICAL_CREAM | Freq: Two times a day (BID) | CUTANEOUS | 0 refills | Status: DC | PRN

## 2017-08-29 NOTE — Patient Instructions (Addendum)
Continue Eucerin, but would recommend using steroid cream if any itching - I will refill the triamcinolone.  If any increased swelling or redness, please return for recheck. Return to the clinic or go to the nearest emergency room if any of your symptoms worsen or new symptoms occur. If any worsening leg pain,  Return sooner.   Bump in hand may be an early contracture - see info below, but I will refer you to hand specialist to evaluate that area further as sometimes an injection is recommended.   IF you received an x-ray today, you will receive an invoice from [facility]. Please contact [facility] at [phone] with questions or concerns regarding your invoice.   IF you received labwork today, you will receive an invoice from [facility]. Please contact [facility] at [phone] with questions or concerns regarding your invoice.   Our billing staff will not be able to assist you with questions regarding bills from these companies.  You will be contacted with the lab results as soon as they are available. The fastest way to get your results is to activate your My Chart account. Instructions are located on the last page of this paperwork. If you have not heard from [place] regarding the results in 2 weeks, please contact this office.      Dupuytren Contracture Dupuytren contracture is a condition in which tissue under the skin of the palm becomes abnormally thickened. This causes one or more of the fingers to curl inward (contract) toward the palm. Eventually, the fingers may not be able to straighten out. This condition affects some or all of the fingers and the palm of the hand. It is often passed along from parent to child (inherited). Dupuytren contracture is a long-term (chronic) condition that develops (progresses) slowly over time. There is no cure, but symptoms can be managed and progression can be slowed with treatment. This condition is usually not dangerous or painful, but it  can interfere with everyday tasks. What are the causes? This condition is caused by tissue (fascia) in the palm getting thicker and tighter. When the fascia thickens, it pulls on the cords of tissue (tendons) that control finger movement. This causes the fingers to contract. The cause of fascia thickening is not known. What increases the risk? This condition may be more likely to develop in:  People who are age 50 or older.  Men.  People with a family history of this condition.  People who use tobacco products, including cigarettes, chewing tobacco, and e-cigarettes.  People who drink alcohol excessively.  People with diabetes.  People with autoimmune diseases, such as HIV.  People with seizure disorders.  What are the signs or symptoms? Symptoms may develop in one or both hands. Any of the fingers can contract. The fingers farthest from the thumb are commonly affected. Usually, this condition is painless. You may have discomfort when holding or grabbing objects. Early symptoms of this condition may include:  Thick, puckered skin on the hand.  One or more lumps (nodules) on the palm. Nodules may be tender when they first appear, but they are generally painless.  Symptoms of this condition develop slowly over months or years. Later symptoms of this condition may include:  Thick cords of tissue in the palm.  Fingers curled up toward the palm.  Inability to straighten the fingers into their normal position.  How is this diagnosed? This condition is diagnosed with a physical exam, which may include:  Looking at your hands and feeling  your hands. This is to check for thickened fascia and nodules.  Measuring finger motion.  Doing the [employer]. You may be asked to try to put your hand on a surface, with your palm down and your fingers straight out.  How is this treated? There is no cure for this condition, but treatment can make symptoms more manageable and  relieve discomfort. Treatment options may include:  Physical therapy. This can strengthen your hand and increase flexibility.  Occupational therapy. This can help you with everyday tasks that may be more difficult because of your condition.  A hand splint.  Shots (injections). Substances may be injected into your hand, such as: ? Medicines that help to decrease swelling (corticosteroids). ? Proteins (collagenase) to weaken thick tissue. After a collagenase injection, your health care provider may stretch your fingers.  Needle aponeurotomy. In this procedure, a needle is pushed through the skin and into the fascia. Moving the needle against the fascia can weaken or break up the thick tissue.  Surgery. This may be needed if your condition causes discomfort or interferes with everyday activities. Physical therapy is usually needed after surgery.  In some cases, symptoms never develop to the point of needing major treatment, and caring for yourself at home can be enough to manage your condition. Symptoms often return after treatment. Follow these instructions at home: If you have a splint:  Do not put pressure on any part of the splint until it is fully hardened. This may take several hours.  Wear the splint as told by your health care provider. Remove it only as told by your health care provider.  Loosen the splint if your fingers tingle, become numb, or turn cold and blue.  Do not let your splint get wet if it is not waterproof. ? If your splint is not waterproof, cover it with a watertight covering when you take a bath or a shower. ? Do not take baths, swim, or use a hot tub until your health care provider approves. Ask your health care provider if you can take showers. You may only be allowed to take sponge baths for bathing.  Keep the splint clean.  Ask your health care provider when it is safe to drive. Hand Care  Take these actions to help protect your hand from possible  injury: ? Use tools that have padded grips. ? Wear protective gloves while you work with your hands. ? Avoid repetitive hand movements.  Avoid actions that cause pain or discomfort.  Stretch your hand by gently pulling your fingers backward toward your wrist. Do this as often as is comfortable. Stop if this causes pain.  Gently massage your hand as often as is comfortable.  If directed, apply heat to the affected area as often as told by your health care provider. Use the heat source that your health care provider recommends, such as a moist heat pack or a heating pad. ? Place a towel between your skin and the heat source. ? Leave the heat on for 20-30 minutes. ? Remove the heat if your skin turns bright red. This is especially important if you are unable to feel pain, heat, or cold. You may have a greater risk of getting burned. General instructions  Take over-the-counter and prescription medicines only as told by your health care provider.  Manage any other conditions that you have, such as diabetes.  If physical therapy was prescribed, do exercises as told by your health care provider.  Keep all  follow-up visits as told by your health care provider. This is important. Contact a health care provider if:  You develop new symptoms, or your symptoms get worse.  You have pain that gets worse or does not get better with medicine.  You have difficulty or discomfort with everyday tasks.  You have problems with your splint.  You develop numbness or tingling. Get help right away if:  You have severe pain.  Your fingers change color or become unusually cold. This information is not intended to replace advice given to you by your health care provider. Make sure you discuss any questions you have with your health care provider. Document Released: 11/19/2008 Document Revised: 03/08/2015 Document Reviewed: 06/16/2014 [employer]vier Interactive Patient Education  [employer].

## 2017-08-29 NOTE — Progress Notes (Signed)
Subjective:  By signing my name below, I, [name], attest that this documentation has been prepared under the direction and in the presence of [name], MD Electronically Signed: Ladene Artist, ED Scribe 08/29/2017 at 10:02 AM.   Patient ID: Seth [name], male    DOB: 1946-01-18, 72 y.o.   MRN: [id number]  Chief Complaint  Patient presents with  . Recheck Both Legs  . small growth on right hand    about a year   HPI Seth [name] is a 72 y.o. male who presents to Primary Care at [facility] for f/u. Last seen 1 wk ago for bilateral leg pain. He was having improving cellulitis, stasis dermatitis, dry skin dermatitis. Continued Keflex, OTC hydrocortisone and Eucerin. Sciatica symptoms had also been improving. - Pt completed Keflex last night, continued Eucerin. He did not try hydrocortisone cream. He is still having some knee pain which he attributes to sciatica.  Bump on R Hand Briefly mentioned last visit but here to have it evaluated today. - Pt initially noticed a growth to his R palm 1 yr ago but thinks it has gradually grown. He has also noticed a new bump 1-2 months ago. Denies pain or difficulty grasping objects. Pt is R hand dominant.  Patient Active Problem List   Diagnosis Date Noted  . PVD (peripheral vascular disease) ([place]) 10/15/2013  . Tobacco use disorder 10/15/2013  . Pure hypercholesterolemia 10/15/2013  . AAA (abdominal aortic aneurysm) ([place]) 10/15/2013  . Coronary artery disease involving native coronary artery of native heart without angina pectoris 10/15/2013  . Cataract 09/29/2013  . Colon cancer screening 09/25/2013  . AAA (abdominal aortic aneurysm) without rupture ([place]) 10/22/2012  . Nicotine addiction 10/17/2012  . Atherosclerosis of native arteries of the extremities with ulceration(440.23) 10/17/2012  . Carotid bruit 10/17/2012  . Other nonspecific abnormal cardiovascular system function study 04/10/2012  . History of sciatica   . Essential  hypertension, benign   . Seasonal allergies   . HTN (hypertension) 04/04/2011  . Abdominal aortic aneurysm ([place]) 11/03/2010   Past Medical History:  Diagnosis Date  . AAA (abdominal aortic aneurysm) ([place])   . Allergic rhinitis   . Anxiety   . Back pain   . CAD (coronary artery disease)   . Essential hypertension, benign   . Gout   . History of sciatica   . Hyperlipidemia   . Prostate hypertrophy   . PVD (peripheral vascular disease) ([place])   . Seasonal allergies    Past Surgical History:  Procedure Laterality Date  . CARDIAC CATHETERIZATION     stent placement  . ILIAC ARTERY STENT     Allergies  Allergen Reactions  . Biaxin [Clarithromycin] Shortness Of Breath  . Clarithromycin Shortness Of Breath  . Iodinated Diagnostic Agents Hives  . Cortisone Other (See Comments)    Turns red from breast up  . Flomax [Tamsulosin Hcl]     Blurred vision and lower back pain   . Iodine Hives    Dye from nuclear medicine test  . Prednisone    Prior to Admission medications   Medication Sig Start Date End Date Taking? Authorizing Provider  aspirin EC 81 MG tablet Take 81 mg by mouth daily.   Yes [provider]  atorvastatin (LIPITOR) 20 MG tablet TAKE 1 TABLET (20 MG TOTAL) BY MOUTH DAILY AT 6 PM. 08/23/17  Yes [name], MD  cetirizine (ZYRTEC) 10 MG tablet Take 10 mg by mouth daily.   Yes [provider]  clobetasol cream (TEMOVATE) 0.05 %  05/20/17  Yes [provider]  clopidogrel (PLAVIX) 75 MG tablet Take 1 tablet (75 mg total) by mouth daily. 04/23/17  Yes [name], MD  clotrimazole (LOTRIMIN) 1 % cream Apply 1 application topically 2 (two) times daily. To affected area on groin. Use for 2 weeks, or at least 2 days after resolution of rash 10/23/16  Yes [name], MD  lisinopril (PRINIVIL,ZESTRIL) 10 MG tablet Take 1 tablet (10 mg total) by mouth daily. 04/23/17  Yes [name], MD  lisinopril (PRINIVIL,ZESTRIL) 5 MG tablet  TAKE 1 TABLET (5 MG TOTAL) BY MOUTH DAILY. 04/23/17  Yes [name], MD  metoprolol tartrate (LOPRESSOR) 50 MG tablet Take 1.5 tablets (75 mg total) by mouth 2 (two) times daily. 04/23/17  Yes [name], MD  ranitidine (ZANTAC) 75 MG tablet Take 75 mg by mouth 2 (two) times daily.   Yes [provider]  SALINE NA Place into the nose as needed.   Yes [provider]  triamcinolone cream (KENALOG) 0.1 % Apply 1 application topically 2 (two) times daily. 04/25/17  Yes [name], MD   Social History   Socioeconomic History  . Marital status: Married    Spouse name: Not on file  . Number of children: 1  . Years of education: Not on file  . Highest education level: Not on file  Occupational History  . Occupation: retired  Scientific laboratory technician  . Financial resource strain: Not hard at all  . Food insecurity:    Worry: Never true    Inability: Never true  . Transportation needs:    Medical: No    Non-medical: No  Tobacco Use  . Smoking status: Former Smoker    Packs/day: 3.00    Years: 50.00    Pack years: 150.00    Types: Cigarettes    Last attempt to quit: 04/05/2016    Years since quitting: 1.4  . Smokeless tobacco: Never Used  Substance and Sexual Activity  . Alcohol use: No    Alcohol/week: 0.0 oz    Comment: sometimes mixed drinks or beers  . Drug use: No  . Sexual activity: Not on file  Lifestyle  . Physical activity:    Days per week: 0 days    Minutes per session: 0 min  . Stress: Not at all  Relationships  . Social connections:    Talks on phone: More than three times a week    Gets together: More than three times a week    Attends religious service: Never    Active member of club or organization: No    Attends meetings of clubs or organizations: Never    Relationship status: Married  . Intimate partner violence:    Fear of current or ex partner: No    Emotionally abused: No    Physically abused: No    Forced sexual activity: No    Other Topics Concern  . Not on file  Social History Narrative   Married   Exercise: No   Education: GED   Review of Systems  Musculoskeletal: Positive for arthralgias (knee pain).  Skin:       + bump to R palm      Objective:   Physical Exam  Constitutional: He is oriented to person, place, and time. He appears well-developed and well-nourished. No distress.  HENT:  Head: Normocephalic and atraumatic.  Eyes: Conjunctivae and EOM are normal.  Neck: Neck  supple. No tracheal deviation present.  Cardiovascular: Normal rate.  Pulmonary/Chest: Effort normal. No respiratory distress.  Musculoskeletal: Normal range of motion.  R hand: appears to be a possible small contracture at base of 2nd finger proximal to MCP. Full extension.   Neurological: He is alert and oriented to person, place, and time.  Skin: Skin is warm and dry.  Few excoriated areas on R leg, minimal erythema. No exudate. L leg appears normal.  Psychiatric: He has a normal mood and affect. His behavior is normal.  Nursing note and vitals reviewed.  Vitals:   08/29/17 0944  BP: 130/74  Pulse: 75  Temp: 97.8 F (36.6 C)  TempSrc: Oral  SpO2: 97%  Weight: 210 lb 6.4 oz (95.4 kg)  Height: 6' (1.829 m)   Dg Hand 2 View Right  Result Date: 08/29/2017 CLINICAL DATA:  Contracture versus mass on the volar aspect of the second MCP area. EXAM: RIGHT HAND - 2 VIEW COMPARISON:  None. FINDINGS: Two views of the right hand were obtained. Negative for a fracture or dislocation. No significant joint space narrowing or degenerative disease. No evidence to suggest a bony or destructive lesion at the MCP joint region. Limited evaluation of the soft tissue in this area. However, there may be focal soft tissue thickening near the volar aspect of the 2nd MCP joint. IMPRESSION: No suspicious bone findings in the right hand. Limited evaluation of soft tissues. Electronically Signed   By: Markus Daft M.D.   On: 08/29/2017 10:37        Assessment & Plan:    Arvle Grabe is a 72 y.o. male Dry skin dermatitis Rash and nonspecific skin eruption  -Stasis dermatitis with component of dry skin dermatitis/cellulitis now improving.  Continue Eucerin, skin care discussed, and triamcinolone refilled as needed for itching.  RTC precautions if increasing erythema or new lesions.  Contracture, right hand - Plan: DG Hand 2 View Right, Ambulatory referral to Hand Surgery Suspected early Dupuytren's contracture without change in range of motion, nontender.  -Handout given on condition, and referred to hand specialist to determine treatment options.  Of note he has not tolerated systemic prednisone in the past. Meds ordered this encounter  Medications  . triamcinolone cream (KENALOG) 0.1 %    Sig: Apply 1 application topically 2 (two) times daily as needed.    Dispense:  30 g    Refill:  0   Patient Instructions   Continue Eucerin, but would recommend using steroid cream if any itching - I will refill the triamcinolone.  If any increased swelling or redness, please return for recheck. Return to the clinic or go to the nearest emergency room if any of your symptoms worsen or new symptoms occur. If any worsening leg pain,  Return sooner.   Bump in hand may be an early contracture - see info below, but I will refer you to hand specialist to evaluate that area further as sometimes an injection is recommended.   IF you received an x-ray today, you will receive an invoice from [facility]. Please contact [facility] at [phone] with questions or concerns regarding your invoice.   IF you received labwork today, you will receive an invoice from [facility]. Please contact [facility] at [phone] with questions or concerns regarding your invoice.   Our billing staff will not be able to assist you with questions regarding bills from these companies.  You will be contacted with the lab results as soon as they are available.  The  fastest way to get your results is to activate your My Chart account. Instructions are located on the last page of this paperwork. If you have not heard from [place] regarding the results in 2 weeks, please contact this office.      Dupuytren Contracture Dupuytren contracture is a condition in which tissue under the skin of the palm becomes abnormally thickened. This causes one or more of the fingers to curl inward (contract) toward the palm. Eventually, the fingers may not be able to straighten out. This condition affects some or all of the fingers and the palm of the hand. It is often passed along from parent to child (inherited). Dupuytren contracture is a long-term (chronic) condition that develops (progresses) slowly over time. There is no cure, but symptoms can be managed and progression can be slowed with treatment. This condition is usually not dangerous or painful, but it can interfere with everyday tasks. What are the causes? This condition is caused by tissue (fascia) in the palm getting thicker and tighter. When the fascia thickens, it pulls on the cords of tissue (tendons) that control finger movement. This causes the fingers to contract. The cause of fascia thickening is not known. What increases the risk? This condition may be more likely to develop in:  People who are age 27 or older.  Men.  People with a family history of this condition.  People who use tobacco products, including cigarettes, chewing tobacco, and e-cigarettes.  People who drink alcohol excessively.  People with diabetes.  People with autoimmune diseases, such as HIV.  People with seizure disorders.  What are the signs or symptoms? Symptoms may develop in one or both hands. Any of the fingers can contract. The fingers farthest from the thumb are commonly affected. Usually, this condition is painless. You may have discomfort when holding or grabbing objects. Early symptoms of this condition may  include:  Thick, puckered skin on the hand.  One or more lumps (nodules) on the palm. Nodules may be tender when they first appear, but they are generally painless.  Symptoms of this condition develop slowly over months or years. Later symptoms of this condition may include:  Thick cords of tissue in the palm.  Fingers curled up toward the palm.  Inability to straighten the fingers into their normal position.  How is this diagnosed? This condition is diagnosed with a physical exam, which may include:  Looking at your hands and feeling your hands. This is to check for thickened fascia and nodules.  Measuring finger motion.  Doing the [employer]. You may be asked to try to put your hand on a surface, with your palm down and your fingers straight out.  How is this treated? There is no cure for this condition, but treatment can make symptoms more manageable and relieve discomfort. Treatment options may include:  Physical therapy. This can strengthen your hand and increase flexibility.  Occupational therapy. This can help you with everyday tasks that may be more difficult because of your condition.  A hand splint.  Shots (injections). Substances may be injected into your hand, such as: ? Medicines that help to decrease swelling (corticosteroids). ? Proteins (collagenase) to weaken thick tissue. After a collagenase injection, your health care provider may stretch your fingers.  Needle aponeurotomy. In this procedure, a needle is pushed through the skin and into the fascia. Moving the needle against the fascia can weaken or break up the thick tissue.  Surgery. This may be needed if  your condition causes discomfort or interferes with everyday activities. Physical therapy is usually needed after surgery.  In some cases, symptoms never develop to the point of needing major treatment, and caring for yourself at home can be enough to manage your condition. Symptoms often return  after treatment. Follow these instructions at home: If you have a splint:  Do not put pressure on any part of the splint until it is fully hardened. This may take several hours.  Wear the splint as told by your health care provider. Remove it only as told by your health care provider.  Loosen the splint if your fingers tingle, become numb, or turn cold and blue.  Do not let your splint get wet if it is not waterproof. ? If your splint is not waterproof, cover it with a watertight covering when you take a bath or a shower. ? Do not take baths, swim, or use a hot tub until your health care provider approves. Ask your health care provider if you can take showers. You may only be allowed to take sponge baths for bathing.  Keep the splint clean.  Ask your health care provider when it is safe to drive. Hand Care  Take these actions to help protect your hand from possible injury: ? Use tools that have padded grips. ? Wear protective gloves while you work with your hands. ? Avoid repetitive hand movements.  Avoid actions that cause pain or discomfort.  Stretch your hand by gently pulling your fingers backward toward your wrist. Do this as often as is comfortable. Stop if this causes pain.  Gently massage your hand as often as is comfortable.  If directed, apply heat to the affected area as often as told by your health care provider. Use the heat source that your health care provider recommends, such as a moist heat pack or a heating pad. ? Place a towel between your skin and the heat source. ? Leave the heat on for 20-30 minutes. ? Remove the heat if your skin turns bright red. This is especially important if you are unable to feel pain, heat, or cold. You may have a greater risk of getting burned. General instructions  Take over-the-counter and prescription medicines only as told by your health care provider.  Manage any other conditions that you have, such as diabetes.  If physical  therapy was prescribed, do exercises as told by your health care provider.  Keep all follow-up visits as told by your health care provider. This is important. Contact a health care provider if:  You develop new symptoms, or your symptoms get worse.  You have pain that gets worse or does not get better with medicine.  You have difficulty or discomfort with everyday tasks.  You have problems with your splint.  You develop numbness or tingling. Get help right away if:  You have severe pain.  Your fingers change color or become unusually cold. This information is not intended to replace advice given to you by your health care provider. Make sure you discuss any questions you have with your health care provider. Document Released: 11/19/2008 Document Revised: 03/08/2015 Document Reviewed: 06/16/2014 Elsevier Interactive Patient Education  [employer].   I personally performed the services described in this documentation, which was scribed in my presence. The recorded information has been reviewed and considered for accuracy and completeness, addended by me as needed, and agree with information above.  Signed,   [name], MD Primary Care at [facility].  08/29/17 1:36 PM

## 2017-09-11 DIAGNOSIS — M720 Palmar fascial fibromatosis [Dupuytren]: Secondary | ICD-10-CM | POA: Diagnosis not present

## 2017-09-20 ENCOUNTER — Other Ambulatory Visit: Payer: Self-pay | Admitting: Family Medicine

## 2017-09-20 DIAGNOSIS — I10 Essential (primary) hypertension: Secondary | ICD-10-CM

## 2017-10-29 ENCOUNTER — Other Ambulatory Visit: Payer: Self-pay | Admitting: Family Medicine

## 2017-10-29 DIAGNOSIS — E785 Hyperlipidemia, unspecified: Secondary | ICD-10-CM

## 2017-10-29 DIAGNOSIS — I10 Essential (primary) hypertension: Secondary | ICD-10-CM

## 2017-11-27 DIAGNOSIS — R05 Cough: Secondary | ICD-10-CM | POA: Diagnosis not present

## 2017-11-27 DIAGNOSIS — R042 Hemoptysis: Secondary | ICD-10-CM | POA: Diagnosis not present

## 2017-12-05 DIAGNOSIS — R05 Cough: Secondary | ICD-10-CM | POA: Diagnosis not present

## 2017-12-05 DIAGNOSIS — R042 Hemoptysis: Secondary | ICD-10-CM | POA: Diagnosis not present

## 2017-12-10 ENCOUNTER — Encounter: Payer: Self-pay | Admitting: Family Medicine

## 2017-12-10 ENCOUNTER — Ambulatory Visit (INDEPENDENT_AMBULATORY_CARE_PROVIDER_SITE_OTHER): Payer: Medicare Other | Admitting: Family Medicine

## 2017-12-10 ENCOUNTER — Other Ambulatory Visit: Payer: Self-pay | Admitting: Family Medicine

## 2017-12-10 VITALS — BP 123/72 | HR 77 | Temp 97.6°F | Ht 72.0 in | Wt 211.8 lb

## 2017-12-10 DIAGNOSIS — Z87891 Personal history of nicotine dependence: Secondary | ICD-10-CM | POA: Diagnosis not present

## 2017-12-10 DIAGNOSIS — J019 Acute sinusitis, unspecified: Secondary | ICD-10-CM

## 2017-12-10 DIAGNOSIS — R059 Cough, unspecified: Secondary | ICD-10-CM

## 2017-12-10 DIAGNOSIS — R042 Hemoptysis: Secondary | ICD-10-CM | POA: Diagnosis not present

## 2017-12-10 DIAGNOSIS — R9389 Abnormal findings on diagnostic imaging of other specified body structures: Secondary | ICD-10-CM

## 2017-12-10 DIAGNOSIS — R05 Cough: Secondary | ICD-10-CM

## 2017-12-10 LAB — POCT CBC
GRANULOCYTE PERCENT: 72.6 % (ref 37–80)
HCT, POC: 47.4 % — AB (ref 29–41)
Hemoglobin: 16.4 g/dL — AB (ref 9.5–13.5)
Lymph, poc: 1.7 (ref 0.6–3.4)
MCH: 31.2 pg (ref 27–31.2)
MCHC: 34.6 g/dL (ref 31.8–35.4)
MCV: 90.2 fL (ref 76–111)
MID (CBC): 0.4 (ref 0–0.9)
MPV: 7.9 fL (ref 0–99.8)
POC GRANULOCYTE: 5.8 (ref 2–6.9)
POC LYMPH PERCENT: 21.8 %L (ref 10–50)
POC MID %: 5.6 % (ref 0–12)
Platelet Count, POC: 206 10*3/uL (ref 142–424)
RBC: 5.25 M/uL (ref 4.69–6.13)
RDW, POC: 14.4 %
WBC: 8 10*3/uL (ref 4.6–10.2)

## 2017-12-10 NOTE — Patient Instructions (Addendum)
I will plan on checking CT scan once I see results form [facility]. Continue Augmentin for sinuses as that could also be cause of blood in mucus. We will determine next step after I see those results. Let me know if there are questions.     If you have lab work done today you will be contacted with your lab results within the next 2 weeks.  If you have not heard from [place] then please contact us. The fastest way to get your results is to register for My Chart.   IF you received an x-ray today, you will receive an invoice from [facility]. Please contact [facility] at [phone] with questions or concerns regarding your invoice.   IF you received labwork today, you will receive an invoice from [place]. Please contact [facility] at [phone] with questions or concerns regarding your invoice.   Our billing staff will not be able to assist you with questions regarding bills from these companies.  You will be contacted with the lab results as soon as they are available. The fastest way to get your results is to activate your My Chart account. Instructions are located on the last page of this paperwork. If you have not heard from [place] regarding the results in 2 weeks, please contact this office.

## 2017-12-10 NOTE — Progress Notes (Signed)
Subjective:  By signing my name below, I, [name], attest that this documentation has been prepared under the direction and in the presence of [name], MD Electronically Signed: Ladene Artist, ED Scribe 12/10/2017 at 8:47 AM.   Patient ID: Seth [name], male    DOB: 09/04/1945, 72 y.o.   MRN: [id number]  Chief Complaint  Patient presents with  . recheck on lungs    wants to see if a scan is needed on his lungs from the Xray results.    HPI Seth [name] is a 72 y.o. male who presents to Primary Care at [facility] for f/u. Last seen 7/25 for issues with hand and dermatitis. Pt states that he was seen at [facility] 1-2 wks ago for blood in sputum which he still has. Pt notices that symptoms are worse after taking a hot shower. Reports a h/o the same symptoms. He was also seen at [facility] last wk who obtained a CXR and saw a place in the R upper lobe and recommended a lung scan. Reports blood in mucus with clearing his throat x 3 wks, sinus pain/pressure. He was started on a 10 day course of Augmentin which has improved sinus pain/pressure and he went 2 days without blood in sputum, but he states that symptoms returned. Pt has 6 days remaining on Augmentin. Denies nosebleeds, unexplained weight loss, night sweats, fever, chills, hematuria, blood in stools, new bruising. H/o tobacco abuse, quit in 04/2016 with 150 pack yrs.  Patient Active Problem List   Diagnosis Date Noted  . PVD (peripheral vascular disease) ([place]) 10/15/2013  . Tobacco use disorder 10/15/2013  . Pure hypercholesterolemia 10/15/2013  . AAA (abdominal aortic aneurysm) ([place]) 10/15/2013  . Coronary artery disease involving native coronary artery of native heart without angina pectoris 10/15/2013  . Cataract 09/29/2013  . Colon cancer screening 09/25/2013  . AAA (abdominal aortic aneurysm) without rupture ([place]) 10/22/2012  . Nicotine addiction 10/17/2012  . Atherosclerosis of native arteries of the extremities with  ulceration(440.23) 10/17/2012  . Carotid bruit 10/17/2012  . Other nonspecific abnormal cardiovascular system function study 04/10/2012  . History of sciatica   . Essential hypertension, benign   . Seasonal allergies   . HTN (hypertension) 04/04/2011  . Abdominal aortic aneurysm ([place]) 11/03/2010   Past Medical History:  Diagnosis Date  . AAA (abdominal aortic aneurysm) ([place])   . Allergic rhinitis   . Anxiety   . Back pain   . CAD (coronary artery disease)   . Essential hypertension, benign   . Gout   . History of sciatica   . Hyperlipidemia   . Prostate hypertrophy   . PVD (peripheral vascular disease) ([place])   . Seasonal allergies    Past Surgical History:  Procedure Laterality Date  . CARDIAC CATHETERIZATION     stent placement  . ILIAC ARTERY STENT     Allergies  Allergen Reactions  . Biaxin [Clarithromycin] Shortness Of Breath  . Clarithromycin Shortness Of Breath  . Iodinated Diagnostic Agents Hives  . Cortisone Other (See Comments)    Turns red from breast up  . Flomax [Tamsulosin Hcl]     Blurred vision and lower back pain   . Iodine Hives    Dye from nuclear medicine test  . Prednisone    Prior to Admission medications   Medication Sig Start Date End Date Taking? Authorizing Provider  aspirin EC 81 MG tablet Take 81 mg by mouth daily.    [provider]  atorvastatin (  LIPITOR) 20 MG tablet TAKE 1 TABLET (20 MG TOTAL) BY MOUTH DAILY AT 6 PM. 08/23/17   [name], MD  cetirizine (ZYRTEC) 10 MG tablet Take 10 mg by mouth daily.    [provider]  clobetasol cream (TEMOVATE) 0.05 %  05/20/17   [provider]  clopidogrel (PLAVIX) 75 MG tablet Take 1 tablet (75 mg total) by mouth daily. 04/23/17   [name], MD  clotrimazole (LOTRIMIN) 1 % cream Apply 1 application topically 2 (two) times daily. To affected area on groin. Use for 2 weeks, or at least 2 days after resolution of rash 10/23/16   [name], MD    lisinopril (PRINIVIL,ZESTRIL) 10 MG tablet TAKE 1 TABLET BY MOUTH EVERY DAY 09/20/17   [name], MD  lisinopril (PRINIVIL,ZESTRIL) 5 MG tablet TAKE 1 TABLET BY MOUTH EVERY DAY 10/29/17   [name], MD  metoprolol tartrate (LOPRESSOR) 50 MG tablet TAKE 1.5 TABLETS (75 MG TOTAL) BY MOUTH 2 (TWO) TIMES DAILY. 10/29/17   [name], MD  ranitidine (ZANTAC) 75 MG tablet Take 75 mg by mouth 2 (two) times daily.    [provider]  SALINE NA Place into the nose as needed.    [provider]  triamcinolone cream (KENALOG) 0.1 % Apply 1 application topically 2 (two) times daily as needed. 08/29/17   [name], MD   Social History   Socioeconomic History  . Marital status: Married    Spouse name: Not on file  . Number of children: 1  . Years of education: Not on file  . Highest education level: Not on file  Occupational History  . Occupation: retired  Scientific laboratory technician  . Financial resource strain: Not hard at all  . Food insecurity:    Worry: Never true    Inability: Never true  . Transportation needs:    Medical: No    Non-medical: No  Tobacco Use  . Smoking status: Former Smoker    Packs/day: 3.00    Years: 50.00    Pack years: 150.00    Types: Cigarettes    Last attempt to quit: 04/05/2016    Years since quitting: 1.6  . Smokeless tobacco: Never Used  Substance and Sexual Activity  . Alcohol use: No    Alcohol/week: 0.0 standard drinks    Comment: sometimes mixed drinks or beers  . Drug use: No  . Sexual activity: Not on file  Lifestyle  . Physical activity:    Days per week: 0 days    Minutes per session: 0 min  . Stress: Not at all  Relationships  . Social connections:    Talks on phone: More than three times a week    Gets together: More than three times a week    Attends religious service: Never    Active member of club or organization: No    Attends meetings of clubs or organizations: Never    Relationship status: Married  .  Intimate partner violence:    Fear of current or ex partner: No    Emotionally abused: No    Physically abused: No    Forced sexual activity: No  Other Topics Concern  . Not on file  Social History Narrative   Married   Exercise: No   Education: GED   Review of Systems  Constitutional: Negative for chills, diaphoresis, fever and unexpected weight change.  HENT: Positive for sinus pressure and sinus pain. Negative for nosebleeds.  Gastrointestinal: Negative for blood in stool.  Genitourinary: Negative for hematuria.      Objective:   Physical Exam  Constitutional: He is oriented to person, place, and time. He appears well-developed and well-nourished.  HENT:  Head: Normocephalic and atraumatic.  Right Ear: Tympanic membrane, external ear and ear canal normal.  Left Ear: Tympanic membrane, external ear and ear canal normal.  Nose: No rhinorrhea.  Mouth/Throat: Oropharynx is clear and moist and mucous membranes are normal. No oropharyngeal exudate or posterior oropharyngeal erythema.  No postnasal drip seen. No bleeding in the nasal passages seen.  Eyes: Pupils are equal, round, and reactive to light. Conjunctivae are normal.  Neck: Neck supple.  Cardiovascular: Normal rate, regular rhythm, normal heart sounds and intact distal pulses.  No murmur heard. Pulmonary/Chest: Effort normal and breath sounds normal. He has no wheezes. He has no rhonchi. He has no rales.  Abdominal: Soft. There is no tenderness.  Lymphadenopathy:    He has no cervical adenopathy.  Neurological: He is alert and oriented to person, place, and time.  Skin: Skin is warm and dry. No rash noted.  Psychiatric: He has a normal mood and affect. His behavior is normal.  Vitals reviewed.  Vitals:   12/10/17 0822  BP: 123/72  Pulse: 77  Temp: 97.6 F (36.4 C)  TempSrc: Oral  SpO2: 93%  Weight: 211 lb 12.8 oz (96.1 kg)  Height: 6' (1.829 m)      Assessment & Plan:  Seth [name] is a 72 y.o.  male Hemoptysis - Plan: POCT CBC  Acute sinusitis, recurrence not specified, unspecified location  History of tobacco abuse  Hemoptysis versus blood in nasal discharge from sinusitis.  Reportedly had abnormality on x-ray at outside office.  Obtain records, plan on CT once results reviewed given prior tobacco use.  Continue Augmentin for sinusitis at this time.  No orders of the defined types were placed in this encounter.  Patient Instructions   I will plan on checking CT scan once I see results form [facility]. Continue Augmentin for sinuses as that could also be cause of blood in mucus. We will determine next step after I see those results. Let me know if there are questions.     If you have lab work done today you will be contacted with your lab results within the next 2 weeks.  If you have not heard from [place] then please contact us. The fastest way to get your results is to register for My Chart.   IF you received an x-ray today, you will receive an invoice from [facility]. Please contact [facility] at [phone] with questions or concerns regarding your invoice.   IF you received labwork today, you will receive an invoice from [facility]. Please contact [facility] at [phone] with questions or concerns regarding your invoice.   Our billing staff will not be able to assist you with questions regarding bills from these companies.  You will be contacted with the lab results as soon as they are available. The fastest way to get your results is to activate your My Chart account. Instructions are located on the last page of this paperwork. If you have not heard from [place] regarding the results in 2 weeks, please contact this office.       I personally performed the services described in this documentation, which was scribed in my presence. The recorded information has been reviewed and considered for accuracy and completeness, addended by me as needed, and agree with  information above.  Signed,   [name], MD Primary Care at [place] [facility].  12/12/17 3:53 PM

## 2017-12-24 ENCOUNTER — Encounter: Payer: Self-pay | Admitting: Family Medicine

## 2017-12-24 ENCOUNTER — Ambulatory Visit (INDEPENDENT_AMBULATORY_CARE_PROVIDER_SITE_OTHER): Payer: Medicare Other | Admitting: Family Medicine

## 2017-12-24 ENCOUNTER — Ambulatory Visit (INDEPENDENT_AMBULATORY_CARE_PROVIDER_SITE_OTHER): Payer: Medicare Other

## 2017-12-24 ENCOUNTER — Other Ambulatory Visit: Payer: Self-pay

## 2017-12-24 VITALS — BP 145/77 | HR 82 | Temp 97.5°F | Ht 72.0 in | Wt 212.0 lb

## 2017-12-24 DIAGNOSIS — R918 Other nonspecific abnormal finding of lung field: Secondary | ICD-10-CM | POA: Diagnosis not present

## 2017-12-24 DIAGNOSIS — R490 Dysphonia: Secondary | ICD-10-CM

## 2017-12-24 DIAGNOSIS — Z87891 Personal history of nicotine dependence: Secondary | ICD-10-CM

## 2017-12-24 DIAGNOSIS — R9389 Abnormal findings on diagnostic imaging of other specified body structures: Secondary | ICD-10-CM

## 2017-12-24 DIAGNOSIS — R0981 Nasal congestion: Secondary | ICD-10-CM | POA: Diagnosis not present

## 2017-12-24 DIAGNOSIS — R042 Hemoptysis: Secondary | ICD-10-CM

## 2017-12-24 LAB — POCT CBC
GRANULOCYTE PERCENT: 68.4 % (ref 37–80)
HEMATOCRIT: 48.7 % — AB (ref 29–41)
HEMOGLOBIN: 16.1 g/dL — AB (ref 9.5–13.5)
LYMPH, POC: 2.1 (ref 0.6–3.4)
MCH, POC: 30.5 pg (ref 27–31.2)
MCHC: 33.1 g/dL (ref 31.8–35.4)
MCV: 92.1 fL (ref 76–111)
MID (cbc): 0.8 (ref 0–0.9)
MPV: 8.2 fL (ref 0–99.8)
POC GRANULOCYTE: 6.3 (ref 2–6.9)
POC LYMPH %: 23.1 % (ref 10–50)
POC MID %: 8.5 %M (ref 0–12)
Platelet Count, POC: 197 10*3/uL (ref 142–424)
RBC: 5.28 M/uL (ref 4.69–6.13)
RDW, POC: 14.5 %
WBC: 9.2 10*3/uL (ref 4.6–10.2)

## 2017-12-24 MED ORDER — IPRATROPIUM BROMIDE 0.06 % NA SOLN: 1.0000 | Freq: Four times a day (QID) | NASAL | 1 refills | Status: DC | PRN

## 2017-12-24 NOTE — Patient Instructions (Addendum)
Hoarseness may be with drainage. You can try atrovent nasal spray in addition to saline nasal spray for congestion, postnasal drip and it may help with eustachian tube function. I will also refer you to Ear nose and throat to evaluate the blood in mucus and hoarseness. Return to the clinic or go to the nearest emergency room if any of your symptoms worsen or new symptoms occur.   I will let you know about an additional antibiotic.   CT was ordered.  Once I see those results, can discuss next step.    Recheck with me in 2 weeks. Return to the clinic or go to the nearest emergency room if any of your symptoms worsen or new symptoms occur.   If you have lab work done today you will be contacted with your lab results within the next 2 weeks.  If you have not heard from [place] then please contact us. The fastest way to get your results is to register for My Chart.   IF you received an x-ray today, you will receive an invoice from [facility]. Please contact [facility] at [phone] with questions or concerns regarding your invoice.   IF you received labwork today, you will receive an invoice from [place]. Please contact [facility] at [phone] with questions or concerns regarding your invoice.   Our billing staff will not be able to assist you with questions regarding bills from these companies.  You will be contacted with the lab results as soon as they are available. The fastest way to get your results is to activate your My Chart account. Instructions are located on the last page of this paperwork. If you have not heard from [place] regarding the results in 2 weeks, please contact this office.

## 2017-12-24 NOTE — Progress Notes (Addendum)
Subjective:  By signing my name below, I, Seth [name], attest that this documentation has been prepared under the direction and in the presence of Seth Ray, MD. Electronically Signed: Moises [name], [place]. 12/24/2017 , 3:48 PM .  Patient was seen in Room 12 .   Patient ID: Seth [name], male    DOB: 1945-12-06, 72 y.o.   MRN: [id number] Chief Complaint  Patient presents with  . [name] in sputum    f/u    HPI Seth [name] is a 72 y.o. male Here for follow up of [name] in sputum. He was seen on Nov 5th. He was initially seen at [facility], and then at [facility] with right upper lobe abnormality on chest xray. The [name] was noted in the mucus from clearing his throat. He also noted sinus pressure. He was treated with Augmentin. He denies any nosebleeds. He does have a history of tobacco use, with quitting in 2018.   Patient states he's still having sporadic [name] in his mucus. He lasted 4 days after last visit without any [name]. But after finishing his Augmentin, he noticed some [name] again after clearing his throat. He also noticed voice hoarseness starting yesterday evening. He also mentions having some right ear ache, sore throat and into his right upper chest, present the whole time, but intermittent. Yesterday, his sore throat was stronger and into this morning. He denies any fever, new cough, nosebleeds, unexpected weight loss or fever. His sinus pressure has overall improved. He feels drainage down his throat from his nose. He had night sweats once. He informs history of lung infection which lasted 70 days.   Patient Active Problem List   Diagnosis Date Noted  . PVD (peripheral vascular disease) ([place]) 10/15/2013  . Tobacco use disorder 10/15/2013  . Pure hypercholesterolemia 10/15/2013  . AAA (abdominal aortic aneurysm) ([place]) 10/15/2013  . Coronary artery disease involving native coronary artery of native heart without angina pectoris 10/15/2013  . Cataract 09/29/2013  .  Colon cancer screening 09/25/2013  . AAA (abdominal aortic aneurysm) without rupture ([place]) 10/22/2012  . Nicotine addiction 10/17/2012  . Atherosclerosis of native arteries of the extremities with ulceration(440.23) 10/17/2012  . Carotid bruit 10/17/2012  . Other nonspecific abnormal cardiovascular system function study 04/10/2012  . History of sciatica   . Essential hypertension, benign   . Seasonal allergies   . HTN (hypertension) 04/04/2011  . Abdominal aortic aneurysm ([place]) 11/03/2010   Past Medical History:  Diagnosis Date  . AAA (abdominal aortic aneurysm) ([place])   . Allergic rhinitis   . Anxiety   . Back pain   . CAD (coronary artery disease)   . Essential hypertension, benign   . Gout   . History of sciatica   . Hyperlipidemia   . Prostate hypertrophy   . PVD (peripheral vascular disease) ([place])   . Seasonal allergies    Past Surgical History:  Procedure Laterality Date  . CARDIAC CATHETERIZATION     stent placement  . ILIAC ARTERY STENT     Allergies  Allergen Reactions  . Biaxin [Clarithromycin] Shortness Of Breath  . Clarithromycin Shortness Of Breath  . Iodinated Diagnostic Agents Hives  . Cortisone Other (See Comments)    Turns red from breast up  . Flomax [Tamsulosin Hcl]     Blurred vision and lower back pain   . Iodine Hives    Dye from nuclear medicine test  . Prednisone    Prior to Admission medications   Medication Sig Start Date  End Date Taking? Authorizing Provider  aspirin EC 81 MG tablet Take 81 mg by mouth daily.   Yes [provider]  atorvastatin (LIPITOR) 20 MG tablet TAKE 1 TABLET (20 MG TOTAL) BY MOUTH DAILY AT 6 PM. 08/23/17  Yes [name], MD  clobetasol cream (TEMOVATE) 0.05 %  05/20/17  Yes [provider]  clopidogrel (PLAVIX) 75 MG tablet Take 1 tablet (75 mg total) by mouth daily. 04/23/17  Yes [name], MD  clotrimazole (LOTRIMIN) 1 % cream Apply 1 application topically 2 (two) times daily. To  affected area on groin. Use for 2 weeks, or at least 2 days after resolution of rash 10/23/16  Yes [name], MD  famotidine (PEPCID) 20 MG tablet Take 20 mg by mouth 2 (two) times daily.   Yes [provider]  lisinopril (PRINIVIL,ZESTRIL) 10 MG tablet TAKE 1 TABLET BY MOUTH EVERY DAY 09/20/17  Yes [name], MD  lisinopril (PRINIVIL,ZESTRIL) 5 MG tablet TAKE 1 TABLET BY MOUTH EVERY DAY 10/29/17  Yes [name], MD  metoprolol tartrate (LOPRESSOR) 50 MG tablet TAKE 1.5 TABLETS (75 MG TOTAL) BY MOUTH 2 (TWO) TIMES DAILY. 10/29/17  Yes [name], MD  SALINE NA Place into the nose as needed.   Yes [provider]  triamcinolone cream (KENALOG) 0.1 % Apply 1 application topically 2 (two) times daily as needed. 08/29/17  Yes [name], MD   Social History   Socioeconomic History  . Marital status: Married    Spouse name: Not on file  . Number of children: 1  . Years of education: Not on file  . Highest education level: Not on file  Occupational History  . Occupation: retired  Scientific laboratory technician  . Financial resource strain: Not hard at all  . Food insecurity:    Worry: Never true    Inability: Never true  . Transportation needs:    Medical: No    Non-medical: No  Tobacco Use  . Smoking status: Former Smoker    Packs/day: 3.00    Years: 50.00    Pack years: 150.00    Types: Cigarettes    Last attempt to quit: 04/05/2016    Years since quitting: 1.7  . Smokeless tobacco: Never Used  Substance and Sexual Activity  . Alcohol use: No    Alcohol/week: 0.0 standard drinks    Comment: sometimes mixed drinks or beers  . Drug use: No  . Sexual activity: Not on file  Lifestyle  . Physical activity:    Days per week: 0 days    Minutes per session: 0 min  . Stress: Not at all  Relationships  . Social connections:    Talks on phone: More than three times a week    Gets together: More than three times a week    Attends religious service:  Never    Active member of club or organization: No    Attends meetings of clubs or organizations: Never    Relationship status: Married  . Intimate partner violence:    Fear of current or ex partner: No    Emotionally abused: No    Physically abused: No    Forced sexual activity: No  Other Topics Concern  . Not on file  Social History Narrative   Married   Exercise: No   Education: GED   Review of Systems  Constitutional: Negative for fatigue, fever and unexpected weight change.  HENT: Positive for ear pain, sinus  pressure, sore throat and voice change. Negative for nosebleeds.   Eyes: Negative for visual disturbance.  Respiratory: Negative for chest tightness and shortness of breath.   Cardiovascular: Negative for chest pain, palpitations and leg swelling.  Gastrointestinal: Negative for abdominal pain and [name] in stool.  Neurological: Negative for dizziness, light-headedness and headaches.       Objective:   Physical Exam  Constitutional: He is oriented to person, place, and time. He appears well-developed and well-nourished.  HENT:  Head: Normocephalic and atraumatic.  Right Ear: Tympanic membrane, external ear and ear canal normal.  Left Ear: Tympanic membrane, external ear and ear canal normal.  Nose: No rhinorrhea. Right sinus exhibits maxillary sinus tenderness.  Mouth/Throat: Oropharynx is clear and moist and mucous membranes are normal. No oropharyngeal exudate or posterior oropharyngeal erythema.  Slight tenderness lower right maxillary sinus, minimal tenderness over right TMJ  Eyes: Pupils are equal, round, and reactive to light. Conjunctivae are normal.  Neck: Neck supple.  Cardiovascular: Normal rate, regular rhythm, normal heart sounds and intact distal pulses.  No murmur heard. Pulmonary/Chest: Effort normal and breath sounds normal. He has no wheezes. He has no rhonchi. He has no rales.  Slight tenderness right upper chest wall, proximal 3rd of clavicle  without overlying abnormality of the skin  Abdominal: Soft. There is no tenderness.  Lymphadenopathy:    He has no cervical adenopathy.  Neurological: He is alert and oriented to person, place, and time.  Skin: Skin is warm and dry. No rash noted.  Psychiatric: He has a normal mood and affect. His behavior is normal.  Vitals reviewed.   Vitals:   12/24/17 1438  BP: (!) 145/77  Pulse: 82  Temp: (!) 97.5 F (36.4 C)  TempSrc: Oral  SpO2: 95%  Weight: 212 lb (96.2 kg)  Height: 6' (1.829 m)   Results for orders placed or performed in visit on 12/24/17  POCT CBC  Result Value Ref Range   WBC 9.2 4.6 - 10.2 K/uL   Lymph, poc 2.1 0.6 - 3.4   POC LYMPH PERCENT 23.1 10 - 50 %L   MID (cbc) 0.8 0 - 0.9   POC MID % 8.5 0 - 12 %M   POC Granulocyte 6.3 2 - 6.9   Granulocyte percent 68.4 37 - 80 %G   RBC 5.28 4.69 - 6.13 M/uL   Hemoglobin 16.1 (A) 9.5 - 13.5 g/dL   HCT, POC 48.7 (A) 29 - 41 %   MCV 92.1 76 - 111 fL   MCH, POC 30.5 27 - 31.2 pg   MCHC 33.1 31.8 - 35.4 g/dL   RDW, POC 14.5 %   Platelet Count, POC 197 142 - 424 K/uL   MPV 8.2 0 - 99.8 fL   Dg Chest 2 View  Result Date: 12/24/2017 CLINICAL DATA:  Hemoptysis, right upper chest discomfort EXAM: CHEST - 2 VIEW COMPARISON:  12/05/2017 chest radiograph. FINDINGS: Stable cardiomediastinal silhouette with normal heart size. No pneumothorax. No pleural effusion. Persistent asymmetric irregular focal opacity in the apical right lung, unchanged. No pulmonary edema. No acute consolidative airspace disease. IMPRESSION: Persistent asymmetric irregular focal opacity in the apical right lung, unchanged since 12/05/2017 chest radiograph, differential includes postinfectious scarring versus irregular pulmonary nodule. Chest CT recommended for further characterization. Electronically Signed   By: Ilona Sorrel M.D.   On: 12/24/2017 16:12       Assessment & Plan:   Tavarious Freel is a 72 y.o. male Chest x-[name] abnormality - Plan: DG  Chest 2  View, CT Chest W Contrast Hemoptysis - Plan: POCT CBC, DG Chest 2 View, CT Chest W Contrast, Ambulatory referral to ENT History of tobacco abuse - Plan: CT Chest W Contrast Sinus congestion - Plan: ipratropium (ATROVENT) 0.06 % nasal spray, Ambulatory referral to ENT Hoarseness - Plan: Ambulatory referral to ENT  - CXR noted - similar to prior reading. Schedule CT chest to evaluate right upper lobe abnormality  -Trial of Atrovent nasal spray for congestion and postnasal drip which may also help with eustachian tube dysfunction.   -Status post antibiotics for sinusitis, consider repeat dosing if persistent discolored nasal discharge  -Refer to ENT given [name] and mucus and hoarseness.  ER/RTC precautions if acute worsening  -Recheck 2 weeks  Meds ordered this encounter  Medications  . ipratropium (ATROVENT) 0.06 % nasal spray    Sig: Place 1-2 sprays into both nostrils 4 (four) times daily as needed for rhinitis.    Dispense:  15 mL    Refill:  1   Patient Instructions   Hoarseness may be with drainage. You can try atrovent nasal spray in addition to saline nasal spray for congestion, postnasal drip and it may help with eustachian tube function. I will also refer you to Ear nose and throat to evaluate the [name] in mucus and hoarseness. Return to the clinic or go to the nearest emergency room if any of your symptoms worsen or new symptoms occur.   I will let you know about an additional antibiotic.   CT was ordered.  Once I see those results, can discuss next step.    Recheck with me in 2 weeks. Return to the clinic or go to the nearest emergency room if any of your symptoms worsen or new symptoms occur.   If you have lab work done today you will be contacted with your lab results within the next 2 weeks.  If you have not heard from [place] then please contact us. The fastest way to get your results is to register for My Chart.   IF you received an x-[name] today, you will receive an invoice  from [facility]. Please contact [facility] at [phone] with questions or concerns regarding your invoice.   IF you received labwork today, you will receive an invoice from [facility]. Please contact [facility] at [phone] with questions or concerns regarding your invoice.   Our billing staff will not be able to assist you with questions regarding bills from these companies.  You will be contacted with the lab results as soon as they are available. The fastest way to get your results is to activate your My Chart account. Instructions are located on the last page of this paperwork. If you have not heard from [place] regarding the results in 2 weeks, please contact this office.       I personally performed the services described in this documentation, which was scribed in my presence. The recorded information has been reviewed and considered for accuracy and completeness, addended by me as needed, and agree with information above.  Signed,   Seth Ray, MD [facility] [place] [facility].  12/28/17 10:54 PM

## 2017-12-27 ENCOUNTER — Telehealth: Payer: Self-pay

## 2017-12-27 ENCOUNTER — Other Ambulatory Visit: Payer: Self-pay

## 2017-12-27 DIAGNOSIS — Z720 Tobacco use: Secondary | ICD-10-CM

## 2017-12-27 NOTE — Telephone Encounter (Signed)
Spoke with Joellen Jersey, RN with [facility]. Pt has history of allergic reaction to iodine Pt unable to take prednisone, nor any of the "sones" Unable to complete CT Chest with Contrast.  Katie checked with radiologist to see what the recommended modality is with the patient's current history of Hemoptysis, tobacco abuse, hoarseness, abnormal chest xray findings.  Radiologist recommends Hi resolution chest CT without contrast.  Order placed.  Message to Dr. Carlota Raspberry

## 2017-12-27 NOTE — Progress Notes (Signed)
Dr. Vonna Kotyk office made aware that due to patient's allergy/sensitivity to both iodinated contrast agents and prednisone, meaning we are unable to prescribe any prep for the CT chest w/ contrast.  I did suggest they could consider doing this at a hospital, since patient stated his cardiologist gave him "radioactive iodine after prepping him with a whole bunch of antihistamines, and (I) did fine."  J. Gretta Cool, RN

## 2017-12-27 NOTE — Telephone Encounter (Signed)
Copied from [place] [id number]. Topic: General - Inquiry >> Dec 27, 2017  1:22 PM Conception Chancy, NT wrote: Reason for CRM: [name] is a nurse with [facility] and states that the patient has an allergic reaction to the Iodine that is in the CT chest prep and they usually order prednisone but he can not take prednisone either. Therefore her medical director told her that they can not do the CT in outpatient setting.   Cb# [phone]  Message sent to Dr. Carlota Raspberry

## 2017-12-27 NOTE — Telephone Encounter (Signed)
Patient called asking why his 13-hour prep hadn't been called in for his CT chest 12/30/17.  He is allergic to both iodinated contrast media (hives) and prednisone (turns "really red"), so we are unable to proceed with scan.  Dr. Vonna Kotyk office made aware.  Gypsy Lore, RN

## 2017-12-29 ENCOUNTER — Encounter: Payer: Self-pay | Admitting: Family Medicine

## 2017-12-30 ENCOUNTER — Inpatient Hospital Stay
Admission: RE | Admit: 2017-12-30 | Discharge: 2017-12-30 | Disposition: A | Discharge status: Home / Self Care | Payer: Self-pay | Source: Ambulatory Visit | Attending: Family Medicine | Admitting: Family Medicine

## 2017-12-31 DIAGNOSIS — I714 Abdominal aortic aneurysm, without rupture: Secondary | ICD-10-CM | POA: Diagnosis not present

## 2017-12-31 DIAGNOSIS — I723 Aneurysm of iliac artery: Secondary | ICD-10-CM | POA: Diagnosis not present

## 2017-12-31 DIAGNOSIS — I70213 Atherosclerosis of native arteries of extremities with intermittent claudication, bilateral legs: Secondary | ICD-10-CM | POA: Diagnosis not present

## 2017-12-31 DIAGNOSIS — I6523 Occlusion and stenosis of bilateral carotid arteries: Secondary | ICD-10-CM | POA: Diagnosis not present

## 2018-01-01 DIAGNOSIS — J310 Chronic rhinitis: Secondary | ICD-10-CM | POA: Diagnosis not present

## 2018-01-01 DIAGNOSIS — R490 Dysphonia: Secondary | ICD-10-CM | POA: Diagnosis not present

## 2018-01-07 ENCOUNTER — Ambulatory Visit (INDEPENDENT_AMBULATORY_CARE_PROVIDER_SITE_OTHER): Payer: Medicare Other | Admitting: Family Medicine

## 2018-01-07 ENCOUNTER — Other Ambulatory Visit: Payer: Self-pay

## 2018-01-07 ENCOUNTER — Encounter: Payer: Self-pay | Admitting: Family Medicine

## 2018-01-07 VITALS — BP 126/82 | HR 65 | Temp 97.8°F | Ht 72.0 in | Wt 213.8 lb

## 2018-01-07 DIAGNOSIS — R042 Hemoptysis: Secondary | ICD-10-CM

## 2018-01-07 NOTE — Patient Instructions (Addendum)
  I will check into the status of the CT scan, but am glad to hear that the blood has improved. Follow up to be determined by CT results. Return to the clinic or go to the nearest emergency room if any of your symptoms worsen or new symptoms occur.    If you have lab work done today you will be contacted with your lab results within the next 2 weeks.  If you have not heard from [place] then please contact us. The fastest way to get your results is to register for My Chart.   IF you received an x-ray today, you will receive an invoice from [facility]. Please contact [facility] at [phone] with questions or concerns regarding your invoice.   IF you received labwork today, you will receive an invoice from [place]. Please contact [facility] at [phone] with questions or concerns regarding your invoice.   Our billing staff will not be able to assist you with questions regarding bills from these companies.  You will be contacted with the lab results as soon as they are available. The fastest way to get your results is to activate your My Chart account. Instructions are located on the last page of this paperwork. If you have not heard from [place] regarding the results in 2 weeks, please contact this office.

## 2018-01-07 NOTE — Progress Notes (Signed)
Subjective:    Patient ID: Seth [name], male    DOB: 1945-04-16, 72 y.o.   MRN: [id number]  HPI Andrey Mccaskill is a 72 y.o. male Presents today for: Chief Complaint  Patient presents with  . Blood in sputum    f/u    Hemoptysis: See previous visits.  Plan for high-resolution CT chest without contrast given prior contrast allergy.  Last seen on November 19.  Had noticed some blood and mucus from clearing her throat, thought to be component of sinus infection.  History of tobacco use, quit in 2018.  He was having sporadic blood in his mucus at last visit, had been treated with Augmentin for sinusitis.  Was having some postnasal discharge at last visit and slight hoarseness.  Chest x-ray last visit indicated persistent asymmetric irregular focal opacity in apical right lung unchanged from October 31.  He was referred to ENT for hoarseness and reported blood tinged mucus, trial of Atrovent nasal spray for congestion and postnasal drip, and plan for CT chest high resolution as above.  Saw ENT last week, scoped. Sinuses, throat and vocal cords looked ok, no sign of bleeding cause at this time (unknown if blood from sinuses prior). No signs of cancer. No new antibiotics. Recommended nasacort QHS, atrovent NS 2 times per day, saline ns as much as needed. ENT was not concerned about exam and hoarseness.   Only 2 episodes of blood in mucus (drop in blood - minimal) on Thanksgiving. Otherwise no blood in mucus past 12 days. Sinuses are better.   No fever/dyspnea.    Patient Active Problem List   Diagnosis Date Noted  . PVD (peripheral vascular disease) ([place]) 10/15/2013  . Tobacco use disorder 10/15/2013  . Pure hypercholesterolemia 10/15/2013  . AAA (abdominal aortic aneurysm) ([place]) 10/15/2013  . Coronary artery disease involving native coronary artery of native heart without angina pectoris 10/15/2013  . Cataract 09/29/2013  . Colon cancer screening 09/25/2013  . AAA (abdominal aortic aneurysm)  without rupture ([place]) 10/22/2012  . Nicotine addiction 10/17/2012  . Atherosclerosis of native arteries of the extremities with ulceration(440.23) 10/17/2012  . Carotid bruit 10/17/2012  . Other nonspecific abnormal cardiovascular system function study 04/10/2012  . History of sciatica   . Essential hypertension, benign   . Seasonal allergies   . HTN (hypertension) 04/04/2011  . Abdominal aortic aneurysm ([place]) 11/03/2010   Past Medical History:  Diagnosis Date  . AAA (abdominal aortic aneurysm) ([place])   . Allergic rhinitis   . Anxiety   . Back pain   . CAD (coronary artery disease)   . Essential hypertension, benign   . Gout   . History of sciatica   . Hyperlipidemia   . Prostate hypertrophy   . PVD (peripheral vascular disease) ([place])   . Seasonal allergies    Past Surgical History:  Procedure Laterality Date  . CARDIAC CATHETERIZATION     stent placement  . ILIAC ARTERY STENT     Allergies  Allergen Reactions  . Biaxin [Clarithromycin] Shortness Of Breath  . Clarithromycin Shortness Of Breath  . Iodinated Diagnostic Agents Hives  . Prednisone Other (See Comments)    Patient states he turns REALLY RED all over.  . Cortisone Other (See Comments)    Turns red from breast up  . Flomax [Tamsulosin Hcl]     Blurred vision and lower back pain   . Iodine Hives    Dye from nuclear medicine test   Prior to Admission medications   Medication  Sig Start Date End Date Taking? Authorizing Provider  aspirin EC 81 MG tablet Take 81 mg by mouth daily.   Yes [provider]  atorvastatin (LIPITOR) 20 MG tablet TAKE 1 TABLET (20 MG TOTAL) BY MOUTH DAILY AT 6 PM. 08/23/17  Yes [name], MD  clobetasol cream (TEMOVATE) 0.05 %  05/20/17  Yes [provider]  clopidogrel (PLAVIX) 75 MG tablet Take 1 tablet (75 mg total) by mouth daily. 04/23/17  Yes [name], MD  clotrimazole (LOTRIMIN) 1 % cream Apply 1 application topically 2 (two) times daily. To  affected area on groin. Use for 2 weeks, or at least 2 days after resolution of rash 10/23/16  Yes [name], MD  famotidine (PEPCID) 20 MG tablet Take 20 mg by mouth 2 (two) times daily.   Yes [provider]  ipratropium (ATROVENT) 0.06 % nasal spray Place 1-2 sprays into both nostrils 4 (four) times daily as needed for rhinitis. 12/24/17  Yes [name], MD  lisinopril (PRINIVIL,ZESTRIL) 10 MG tablet TAKE 1 TABLET BY MOUTH EVERY DAY 09/20/17  Yes [name], MD  lisinopril (PRINIVIL,ZESTRIL) 5 MG tablet TAKE 1 TABLET BY MOUTH EVERY DAY 10/29/17  Yes [name], MD  metoprolol tartrate (LOPRESSOR) 50 MG tablet TAKE 1.5 TABLETS (75 MG TOTAL) BY MOUTH 2 (TWO) TIMES DAILY. 10/29/17  Yes [name], MD  SALINE NA Place into the nose as needed.   Yes [provider]  triamcinolone cream (KENALOG) 0.1 % Apply 1 application topically 2 (two) times daily as needed. 08/29/17  Yes [name], MD   Social History   Socioeconomic History  . Marital status: Married    Spouse name: Not on file  . Number of children: 1  . Years of education: Not on file  . Highest education level: Not on file  Occupational History  . Occupation: retired  Scientific laboratory technician  . Financial resource strain: Not hard at all  . Food insecurity:    Worry: Never true    Inability: Never true  . Transportation needs:    Medical: No    Non-medical: No  Tobacco Use  . Smoking status: Former Smoker    Packs/day: 3.00    Years: 50.00    Pack years: 150.00    Types: Cigarettes    Last attempt to quit: 04/05/2016    Years since quitting: 1.7  . Smokeless tobacco: Never Used  Substance and Sexual Activity  . Alcohol use: No    Alcohol/week: 0.0 standard drinks    Comment: sometimes mixed drinks or beers  . Drug use: No  . Sexual activity: Not on file  Lifestyle  . Physical activity:    Days per week: 0 days    Minutes per session: 0 min  . Stress: Not at all    Relationships  . Social connections:    Talks on phone: More than three times a week    Gets together: More than three times a week    Attends religious service: Never    Active member of club or organization: No    Attends meetings of clubs or organizations: Never    Relationship status: Married  . Intimate partner violence:    Fear of current or ex partner: No    Emotionally abused: No    Physically abused: No    Forced sexual activity: No  Other Topics Concern  . Not on file  Social History Narrative  Married   Exercise: No   Education: GED   Review of Systems  Constitutional: Negative for fever and unexpected weight change.  HENT: Positive for congestion. Negative for nosebleeds.   Respiratory: Negative for cough, choking, shortness of breath and wheezing.    As above.     Objective:   Physical Exam  Constitutional: He is oriented to person, place, and time. He appears well-developed and well-nourished.  HENT:  Head: Normocephalic and atraumatic.  Right Ear: Tympanic membrane, external ear and ear canal normal.  Left Ear: Tympanic membrane, external ear and ear canal normal.  Nose: No rhinorrhea.  Mouth/Throat: Oropharynx is clear and moist and mucous membranes are normal. No oropharyngeal exudate or posterior oropharyngeal erythema.  Eyes: Pupils are equal, round, and reactive to light. Conjunctivae are normal.  Neck: Neck supple.  Cardiovascular: Normal rate, regular rhythm, normal heart sounds and intact distal pulses.  No murmur heard. Pulmonary/Chest: Effort normal and breath sounds normal. He has no wheezes. He has no rhonchi. He has no rales.  Abdominal: Soft. There is no tenderness.  Lymphadenopathy:    He has no cervical adenopathy.  Neurological: He is alert and oriented to person, place, and time.  Skin: Skin is warm and dry. No rash noted.  Psychiatric: He has a normal mood and affect. His behavior is normal.  Vitals reviewed.  Vitals:   01/07/18  1146  BP: 126/82  Pulse: 65  Temp: 97.8 F (36.6 C)  TempSrc: Oral  SpO2: 96%  Weight: 213 lb 12.8 oz (97 kg)  Height: 6' (1.829 m)       Assessment & Plan:  Tyrease Vandeberg is a 72 y.o. male Hemoptysis  - high-resolution CT ordered as difficulty with contrast and prednisone.  -Recent ear nose and throat evaluation reassuring.  No sign of active bleeding or mass, still could have been related to sinus disease but now improved.  Has had improvement in blood and mucus as well.  -Further work-up to be determined by CT results   No orders of the defined types were placed in this encounter.  Patient Instructions    I will check into the status of the CT scan, but am glad to hear that the blood has improved. Follow up to be determined by CT results. Return to the clinic or go to the nearest emergency room if any of your symptoms worsen or new symptoms occur.    If you have lab work done today you will be contacted with your lab results within the next 2 weeks.  If you have not heard from [place] then please contact us. The fastest way to get your results is to register for My Chart.   IF you received an x-ray today, you will receive an invoice from [facility]. Please contact [facility] at [phone] with questions or concerns regarding your invoice.   IF you received labwork today, you will receive an invoice from [facility]. Please contact [facility] at [phone] with questions or concerns regarding your invoice.   Our billing staff will not be able to assist you with questions regarding bills from these companies.  You will be contacted with the lab results as soon as they are available. The fastest way to get your results is to activate your My Chart account. Instructions are located on the last page of this paperwork. If you have not heard from [place] regarding the results in 2 weeks, please contact this office.       Signed,  [name], MD [facility]  [place].  01/08/18 9:41 AM

## 2018-01-08 ENCOUNTER — Encounter: Payer: Self-pay | Admitting: Family Medicine

## 2018-01-08 DIAGNOSIS — I6523 Occlusion and stenosis of bilateral carotid arteries: Secondary | ICD-10-CM | POA: Diagnosis not present

## 2018-01-08 DIAGNOSIS — I739 Peripheral vascular disease, unspecified: Secondary | ICD-10-CM | POA: Diagnosis not present

## 2018-01-08 DIAGNOSIS — I723 Aneurysm of iliac artery: Secondary | ICD-10-CM | POA: Diagnosis not present

## 2018-01-08 DIAGNOSIS — G458 Other transient cerebral ischemic attacks and related syndromes: Secondary | ICD-10-CM | POA: Diagnosis not present

## 2018-01-08 DIAGNOSIS — I10 Essential (primary) hypertension: Secondary | ICD-10-CM | POA: Diagnosis not present

## 2018-01-08 DIAGNOSIS — I70213 Atherosclerosis of native arteries of extremities with intermittent claudication, bilateral legs: Secondary | ICD-10-CM | POA: Diagnosis not present

## 2018-01-08 DIAGNOSIS — I714 Abdominal aortic aneurysm, without rupture: Secondary | ICD-10-CM | POA: Diagnosis not present

## 2018-02-10 ENCOUNTER — Telehealth: Payer: Self-pay | Admitting: Family Medicine

## 2018-02-10 DIAGNOSIS — R042 Hemoptysis: Secondary | ICD-10-CM

## 2018-02-10 NOTE — Telephone Encounter (Signed)
The one for high resolution was sent on 11/22  Please adv or enter another referral   Thank you   Copied from [place] [phone]. Topic: Referral - Request for Referral >> Feb 10, 2018 11:32 AM Alanda Slim E wrote: Has patient seen PCP for this complaint? Yes *If NO, is insurance requiring patient see PCP for this issue before PCP can refer them? Not sure  Referral for which specialty: Radiology  Preferred provider/office:  Reason for referral: Cough/chest/lungs   Pt was ordered a CAT scan of chest but when Pt called radiology Pt advised that he was allergic to the Radioactive Dye and to Cortizone and Scan was cancelled. Pt called Dr. Hervey Ard office and was advised that a new order was sent but no order has been sent according to radiology/ please advise and call Pt as soon as referral request is sent

## 2018-02-11 NOTE — Telephone Encounter (Signed)
New order placed.  Please let me know when this has been scheduled and advise patient as well.  Thank you.

## 2018-02-12 DIAGNOSIS — L509 Urticaria, unspecified: Secondary | ICD-10-CM | POA: Diagnosis not present

## 2018-02-12 DIAGNOSIS — J300 Vasomotor rhinitis: Secondary | ICD-10-CM | POA: Diagnosis not present

## 2018-02-13 NOTE — Telephone Encounter (Signed)
Pt appointment has been schedule for Friday, 02/14/18 at 9:00 am. Pt advised.

## 2018-02-14 ENCOUNTER — Ambulatory Visit
Admission: RE | Admit: 2018-02-14 | Discharge: 2018-02-14 | Disposition: A | Discharge status: Home / Self Care | Payer: Medicare Other | Source: Ambulatory Visit | Attending: Family Medicine | Admitting: Family Medicine

## 2018-02-14 DIAGNOSIS — R042 Hemoptysis: Secondary | ICD-10-CM

## 2018-02-14 DIAGNOSIS — R918 Other nonspecific abnormal finding of lung field: Secondary | ICD-10-CM | POA: Diagnosis not present

## 2018-02-14 DIAGNOSIS — J9811 Atelectasis: Secondary | ICD-10-CM | POA: Diagnosis not present

## 2018-02-16 ENCOUNTER — Other Ambulatory Visit: Payer: Self-pay | Admitting: Family Medicine

## 2018-02-16 DIAGNOSIS — E785 Hyperlipidemia, unspecified: Secondary | ICD-10-CM

## 2018-02-17 ENCOUNTER — Other Ambulatory Visit: Payer: Self-pay | Admitting: Family Medicine

## 2018-02-17 DIAGNOSIS — Z87891 Personal history of nicotine dependence: Secondary | ICD-10-CM

## 2018-02-17 DIAGNOSIS — R911 Solitary pulmonary nodule: Secondary | ICD-10-CM

## 2018-02-17 NOTE — Progress Notes (Signed)
PET scan and PFT's ordered, referred to pulmonary for eval and possible navigational bronch. Reviewed plan with patient. All questions answered.

## 2018-02-20 ENCOUNTER — Ambulatory Visit (INDEPENDENT_AMBULATORY_CARE_PROVIDER_SITE_OTHER): Payer: Medicare Other | Admitting: Pulmonary Disease

## 2018-02-20 ENCOUNTER — Encounter: Payer: Self-pay | Admitting: Pulmonary Disease

## 2018-02-20 ENCOUNTER — Telehealth: Payer: Self-pay | Admitting: Pulmonary Disease

## 2018-02-20 VITALS — BP 126/66 | HR 73 | Ht 72.0 in | Wt 212.4 lb

## 2018-02-20 DIAGNOSIS — R590 Localized enlarged lymph nodes: Secondary | ICD-10-CM | POA: Diagnosis not present

## 2018-02-20 DIAGNOSIS — F17200 Nicotine dependence, unspecified, uncomplicated: Secondary | ICD-10-CM

## 2018-02-20 DIAGNOSIS — R911 Solitary pulmonary nodule: Secondary | ICD-10-CM

## 2018-02-20 DIAGNOSIS — Z7902 Long term (current) use of antithrombotics/antiplatelets: Secondary | ICD-10-CM

## 2018-02-20 DIAGNOSIS — Z01818 Encounter for other preprocedural examination: Secondary | ICD-10-CM

## 2018-02-20 DIAGNOSIS — R918 Other nonspecific abnormal finding of lung field: Secondary | ICD-10-CM

## 2018-02-20 LAB — CBC WITH DIFFERENTIAL/PLATELET
Basophils Absolute: 0.1 10*3/uL (ref 0.0–0.1)
Basophils Relative: 1 % (ref 0.0–3.0)
EOS PCT: 3.5 % (ref 0.0–5.0)
Eosinophils Absolute: 0.3 10*3/uL (ref 0.0–0.7)
HCT: 47.5 % (ref 39.0–52.0)
Hemoglobin: 16 g/dL (ref 13.0–17.0)
Lymphocytes Relative: 23.7 % (ref 12.0–46.0)
Lymphs Abs: 1.8 10*3/uL (ref 0.7–4.0)
MCHC: 33.6 g/dL (ref 30.0–36.0)
MCV: 91 fl (ref 78.0–100.0)
MONOS PCT: 8.6 % (ref 3.0–12.0)
Monocytes Absolute: 0.7 10*3/uL (ref 0.1–1.0)
Neutro Abs: 4.9 10*3/uL (ref 1.4–7.7)
Neutrophils Relative %: 63.2 % (ref 43.0–77.0)
Platelets: 226 10*3/uL (ref 150.0–400.0)
RBC: 5.21 Mil/uL (ref 4.22–5.81)
RDW: 13.9 % (ref 11.5–15.5)
WBC: 7.8 10*3/uL (ref 4.0–10.5)

## 2018-02-20 LAB — COMPREHENSIVE METABOLIC PANEL
ALK PHOS: 91 U/L (ref 39–117)
ALT: 12 U/L (ref 0–53)
AST: 12 U/L (ref 0–37)
Albumin: 4.4 g/dL (ref 3.5–5.2)
BUN: 15 mg/dL (ref 6–23)
CO2: 25 mEq/L (ref 19–32)
Calcium: 9.6 mg/dL (ref 8.4–10.5)
Chloride: 102 mEq/L (ref 96–112)
Creatinine, Ser: 1.33 mg/dL (ref 0.40–1.50)
GFR: 56.05 mL/min — AB (ref >60.00)
Glucose, Bld: 111 mg/dL — ABNORMAL HIGH (ref 70–99)
Potassium: 4.7 mEq/L (ref 3.5–5.1)
Sodium: 135 mEq/L (ref 135–145)
Total Bilirubin: 0.7 mg/dL (ref 0.2–1.2)
Total Protein: 7.2 g/dL (ref 6.0–8.3)

## 2018-02-20 NOTE — Telephone Encounter (Signed)
Spoke with Shirlean Mylar, she wanted to let [place] know that his CD will be here tomorrow. She said the carrier already left for today.

## 2018-02-20 NOTE — Patient Instructions (Addendum)
Thank you for visiting Dr. Valeta Harms at [facility]. Today we recommend the following: Orders Placed This Encounter  Procedures  . NM PET Image Initial (PI) Skull Base To Thigh  . CBC w/Diff  . Comprehensive metabolic panel  . Ambulatory referral to Pulmonology  . Pulmonary function test   Please stop taking your PLAVIX today.   Your Pulmonary Function Test is scheduled for Monday January 20th at 9:00am.   Your EBUS procedure is scheduled 02/26/2018 at 1:00pm. PLEASE ARRIVE BY 11:00AM at [place]. Go to Admitting and give them your name and date of birth.   Return in about 4 weeks (around 03/20/2018).

## 2018-02-20 NOTE — Telephone Encounter (Signed)
Noted. Will await Disc.

## 2018-02-20 NOTE — Progress Notes (Signed)
Synopsis: Referred in Jan 2020 for lung nodule by [name], MD  Subjective:   PATIENT ID: Seth [name] GENDER: male DOB: 01/31/1946, MRN: [id number]  Chief Complaint  Patient presents with  . Consult    Consult for lung nodule.     PMH PVD, s/p peripheral stent, on plavix, hemoptysis back in November.  He had chest x-ray completed that time which revealed an opacity within the left chest.  He subsequently had difficulty getting his CT of the chest approved.  Finally he had CT chest of imaging approved in January 2020.  This imaging revealed a left lung mass as well as an enlarged hilar adenopathy.  Patient was referred to pulmonary for evaluation.  He has been a longtime smoker quit 2 years ago.  He has never been diagnosed with COPD.  Otherwise his breathing is about stable.  Most of his activities of daily been is limited due to claudication in the legs.  He does not ever really get short of breath because he is limited on his ability due to the leg pains that develop.   Past Medical History:  Diagnosis Date  . AAA (abdominal aortic aneurysm) ([place])   . Allergic rhinitis   . Anxiety   . Back pain   . CAD (coronary artery disease)   . Essential hypertension, benign   . Gout   . History of sciatica   . Hyperlipidemia   . Prostate hypertrophy   . PVD (peripheral vascular disease) ([place])   . Seasonal allergies      Family History  Problem Relation Age of Onset  . Hypertension Mother   . Heart disease Mother   . Stroke Mother   . Heart disease Paternal Grandmother   . Diabetes Paternal Grandmother   . Hyperlipidemia Paternal Grandmother   . Diabetes Sister   . Heart disease Sister   . Hyperlipidemia Sister   . Heart disease Brother   . Hyperlipidemia Brother   . Hypertension Brother   . Diabetes Paternal Grandfather   . Heart disease Paternal Grandfather   . Hyperlipidemia Paternal Grandfather   . Heart disease Maternal Uncle        x 2  . Cancer Maternal  Grandmother        type unknown, ? lung  . Leukemia Maternal Uncle      Past Surgical History:  Procedure Laterality Date  . CARDIAC CATHETERIZATION     stent placement  . ILIAC ARTERY STENT      Social History   Socioeconomic History  . Marital status: Married    Spouse name: Not on file  . Number of children: 1  . Years of education: Not on file  . Highest education level: Not on file  Occupational History  . Occupation: retired  Scientific laboratory technician  . Financial resource strain: Not hard at all  . Food insecurity:    Worry: Never true    Inability: Never true  . Transportation needs:    Medical: No    Non-medical: No  Tobacco Use  . Smoking status: Former Smoker    Packs/day: 3.00    Years: 50.00    Pack years: 150.00    Types: Cigarettes    Last attempt to quit: 04/05/2016    Years since quitting: 1.8  . Smokeless tobacco: Never Used  Substance and Sexual Activity  . Alcohol use: No    Alcohol/week: 0.0 standard drinks    Comment: sometimes mixed drinks or beers  .  Drug use: No  . Sexual activity: Not on file  Lifestyle  . Physical activity:    Days per week: 0 days    Minutes per session: 0 min  . Stress: Not at all  Relationships  . Social connections:    Talks on phone: More than three times a week    Gets together: More than three times a week    Attends religious service: Never    Active member of club or organization: No    Attends meetings of clubs or organizations: Never    Relationship status: Married  . Intimate partner violence:    Fear of current or ex partner: No    Emotionally abused: No    Physically abused: No    Forced sexual activity: No  Other Topics Concern  . Not on file  Social History Narrative   Married   Exercise: No   Education: GED     Allergies  Allergen Reactions  . Biaxin [Clarithromycin] Shortness Of Breath  . Clarithromycin Shortness Of Breath  . Iodinated Diagnostic Agents Hives  . Prednisone Other (See Comments)      Patient states he turns REALLY RED all over.  . Cortisone Other (See Comments)    Turns red from breast up  . Flomax [Tamsulosin Hcl]     Blurred vision and lower back pain   . Iodine Hives    Dye from nuclear medicine test     Outpatient Medications Prior to Visit  Medication Sig Dispense Refill  . atorvastatin (LIPITOR) 20 MG tablet TAKE 1 TABLET (20 MG TOTAL) BY MOUTH DAILY AT 6 PM. 90 tablet 1  . clobetasol cream (TEMOVATE) 0.05 %     . clopidogrel (PLAVIX) 75 MG tablet Take 1 tablet (75 mg total) by mouth daily. 90 tablet 2  . clotrimazole (LOTRIMIN) 1 % cream Apply 1 application topically 2 (two) times daily. To affected area on groin. Use for 2 weeks, or at least 2 days after resolution of rash 30 g 0  . famotidine (PEPCID) 20 MG tablet Take 20 mg by mouth 2 (two) times daily.    Marland Kitchen ipratropium (ATROVENT) 0.06 % nasal spray Place 1-2 sprays into both nostrils 4 (four) times daily as needed for rhinitis. (Patient taking differently: Place 1-2 sprays into both nostrils 2 (two) times daily. ) 15 mL 1  . lisinopril (PRINIVIL,ZESTRIL) 10 MG tablet TAKE 1 TABLET BY MOUTH EVERY DAY 90 tablet 1  . lisinopril (PRINIVIL,ZESTRIL) 5 MG tablet TAKE 1 TABLET BY MOUTH EVERY DAY 90 tablet 1  . metoprolol tartrate (LOPRESSOR) 50 MG tablet TAKE 1.5 TABLETS (75 MG TOTAL) BY MOUTH 2 (TWO) TIMES DAILY. 270 tablet 1  . triamcinolone cream (KENALOG) 0.1 % Apply 1 application topically 2 (two) times daily as needed. 30 g 0  . aspirin EC 81 MG tablet Take 81 mg by mouth daily.    Marland Kitchen SALINE NA Place into the nose as needed.     Facility-Administered Medications Prior to Visit  Medication Dose Route Frequency Provider Last Rate Last Dose  . 0.9 %  sodium chloride infusion  500 mL Intravenous Once Irene Shipper, MD        Review of Systems  Constitutional: Negative for chills, fever, malaise/fatigue and weight loss.  HENT: Negative for hearing loss, sore throat and tinnitus.   Eyes: Negative for blurred  vision and double vision.  Respiratory: Positive for hemoptysis. Negative for cough, sputum production, shortness of breath, wheezing and stridor.  Cardiovascular: Negative for chest pain, palpitations, orthopnea, leg swelling and PND.  Gastrointestinal: Negative for abdominal pain, constipation, diarrhea, heartburn, nausea and vomiting.  Genitourinary: Negative for dysuria, hematuria and urgency.  Musculoskeletal: Negative for joint pain and myalgias.  Skin: Negative for itching and rash.  Neurological: Negative for dizziness, tingling, weakness and headaches.  Endo/Heme/Allergies: Negative for environmental allergies. Does not bruise/bleed easily.  Psychiatric/Behavioral: Negative for depression. The patient is not nervous/anxious and does not have insomnia.   All other systems reviewed and are negative.    Objective:  Physical Exam Vitals signs reviewed.  Constitutional:      General: He is not in acute distress.    Appearance: He is well-developed.  HENT:     Head: Normocephalic and atraumatic.  Eyes:     General: No scleral icterus.    Conjunctiva/sclera: Conjunctivae normal.     Pupils: Pupils are equal, round, and reactive to light.  Neck:     Musculoskeletal: Neck supple.     Vascular: No JVD.     Trachea: No tracheal deviation.  Cardiovascular:     Rate and Rhythm: Normal rate and regular rhythm.     Heart sounds: Normal heart sounds. No murmur.  Pulmonary:     Effort: Pulmonary effort is normal. No tachypnea, accessory muscle usage or respiratory distress.     Breath sounds: No stridor. No wheezing, rhonchi or rales.     Comments: Diminished breath sounds bilaterally Abdominal:     General: Bowel sounds are normal. There is no distension.     Palpations: Abdomen is soft.     Tenderness: There is no abdominal tenderness.  Musculoskeletal:        General: No tenderness.  Lymphadenopathy:     Cervical: No cervical adenopathy.  Skin:    General: Skin is warm and  dry.     Capillary Refill: Capillary refill takes less than 2 seconds.     Findings: No rash.  Neurological:     Mental Status: He is alert and oriented to person, place, and time.  Psychiatric:        Behavior: Behavior normal.      Vitals:   02/20/18 0940  BP: 126/66  Pulse: 73  SpO2: 97%  Weight: 212 lb 6.4 oz (96.3 kg)  Height: 6' (1.829 m)   97% on RA BMI Readings from Last 3 Encounters:  02/20/18 28.81 kg/m  01/07/18 29.00 kg/m  12/24/17 28.75 kg/m   Wt Readings from Last 3 Encounters:  02/20/18 212 lb 6.4 oz (96.3 kg)  01/07/18 213 lb 12.8 oz (97 kg)  12/24/17 212 lb (96.2 kg)     CBC    Component Value Date/Time   WBC 9.2 12/24/2017 1551   RBC 5.28 12/24/2017 1551   HGB 16.1 (A) 12/24/2017 1551   HCT 48.7 (A) 12/24/2017 1551   MCV 92.1 12/24/2017 1551   MCH 30.5 12/24/2017 1551   MCHC 33.1 12/24/2017 1551    Chest Imaging: CT Chest 02/14/2018: IMPRESSION: 1. 2.1 cm central lingular mass with associated atelectasis highly suspicious for primary malignancy. Equivocal enlarged LEFT hilar lymph node. 2. Biapical pleural-parenchymal opacities most suggestive of scarring. Recommend attention on future scans or PET CT. 3. Aortic Atherosclerosis (ICD10-I70.0) and Emphysema (ICD10-J43.9).  Pulmonary Functions Testing Results: No flowsheet data found.  FeNO: None   Pathology: None   Echocardiogram: None  Heart Catheterization: None     Assessment & Plan:   Solitary lung nodule - Plan: Ambulatory referral to Pulmonology, Pulmonary  function test, CBC w/Diff, CBC w/Diff  Tobacco use disorder  Hilar adenopathy - Plan: Ambulatory referral to Pulmonology, CBC w/Diff, Comprehensive metabolic panel, CBC w/Diff, Comprehensive metabolic panel  Abnormal findings on diagnostic imaging of lung - Plan: NM PET Image Initial (PI) Skull Base To Thigh  Pre-operative clearance - Plan: CBC w/Diff, Comprehensive metabolic panel, CBC w/Diff, Comprehensive  metabolic panel  Platelet inhibition due to Plavix  Long term current use of antithrombotics/antiplatelets  Discussion:  This is a 73 year old gentleman with lung nodule in the left chest as well as associated left hilar adenopathy as seen on CT imaging.  This is concerning for a primary bronchogenic carcinoma.  Of note the patient is on Plavix after having peripheral stent placement.  He follows with [facility]ology at [facility].  Today in the office we discussed the risks, benefits and alternatives of proceeding with tissue sampling for diagnosis of concern of lung cancer.  Patient is in agreement with proceeding with tissue diagnosis.  We discussed options to include navigational bronchoscopy, endobronchial ultrasound-guided transbronchial needle aspirations of the left hilar lymph node as well as considerations for percutaneous approach with a referral to interventional radiology.  After discussing these risks, benefits and alternatives the decision was made to proceed with outpatient bronchoscopy.  We will plan for next Wednesday, January 22 to complete a electromagnetic navigational bronchoscopy and curvilinear endobronchial ultrasound transbronchial needle aspirations of the left hilum and radial endobronchial ultrasound to the peripheral lung lesion.  This will allow [place] to complete tissue diagnosis and staging.  We discussed that we are able to obtain a tissue diagnosis in approximately 70% of these cases.  There is a chance that even undergoing the procedure we would not be able to obtain diagnosis and may need to pursue alternative routes.  In the meantime we will need to order the following: PFTs to be completed ASAP We will also need a PET scan to be completed for further staging. Patient will need to hold his Plavix starting today. We will fax clearance forms for this and inform his cardiology service at [facility]. We will obtain his preop labs.  Greater than 50% of this  patient's 60-minute office visit was spent face-to-face discussing the above recommendations and treatment plan as outlined above in the diagnostic algorithm including counseling on the above procedures.    Current Outpatient Medications:  .  atorvastatin (LIPITOR) 20 MG tablet, TAKE 1 TABLET (20 MG TOTAL) BY MOUTH DAILY AT 6 PM., Disp: 90 tablet, Rfl: 1 .  clobetasol cream (TEMOVATE) 0.05 %, , Disp: , Rfl:  .  clopidogrel (PLAVIX) 75 MG tablet, Take 1 tablet (75 mg total) by mouth daily., Disp: 90 tablet, Rfl: 2 .  clotrimazole (LOTRIMIN) 1 % cream, Apply 1 application topically 2 (two) times daily. To affected area on groin. Use for 2 weeks, or at least 2 days after resolution of rash, Disp: 30 g, Rfl: 0 .  famotidine (PEPCID) 20 MG tablet, Take 20 mg by mouth 2 (two) times daily., Disp: , Rfl:  .  ipratropium (ATROVENT) 0.06 % nasal spray, Place 1-2 sprays into both nostrils 4 (four) times daily as needed for rhinitis. (Patient taking differently: Place 1-2 sprays into both nostrils 2 (two) times daily. ), Disp: 15 mL, Rfl: 1 .  lisinopril (PRINIVIL,ZESTRIL) 10 MG tablet, TAKE 1 TABLET BY MOUTH EVERY DAY, Disp: 90 tablet, Rfl: 1 .  lisinopril (PRINIVIL,ZESTRIL) 5 MG tablet, TAKE 1 TABLET BY MOUTH EVERY DAY, Disp: 90 tablet, Rfl: 1 .  metoprolol tartrate (LOPRESSOR) 50 MG tablet, TAKE 1.5 TABLETS (75 MG TOTAL) BY MOUTH 2 (TWO) TIMES DAILY., Disp: 270 tablet, Rfl: 1 .  triamcinolone cream (KENALOG) 0.1 %, Apply 1 application topically 2 (two) times daily as needed., Disp: 30 g, Rfl: 0 .  aspirin EC 81 MG tablet, Take 81 mg by mouth daily., Disp: , Rfl:   Current Facility-Administered Medications:  .  0.9 %  sodium chloride infusion, 500 mL, Intravenous, Once, Irene Shipper, MD   Garner Nash, DO [facility] 02/20/2018 9:52 AM

## 2018-02-20 NOTE — H&P (View-Only) (Signed)
Synopsis: Referred in Jan 2020 for lung nodule by [name], MD  Subjective:   PATIENT ID: Seth [name] GENDER: male DOB: Dec 31, 1945, MRN: [id number]  Chief Complaint  Patient presents with  . Consult    Consult for lung nodule.     PMH PVD, s/p peripheral stent, on plavix, hemoptysis back in November.  He had chest x-ray completed that time which revealed an opacity within the left chest.  He subsequently had difficulty getting his CT of the chest approved.  Finally he had CT chest of imaging approved in January 2020.  This imaging revealed a left lung mass as well as an enlarged hilar adenopathy.  Patient was referred to pulmonary for evaluation.  He has been a longtime smoker quit 2 years ago.  He has never been diagnosed with COPD.  Otherwise his breathing is about stable.  Most of his activities of daily been is limited due to claudication in the legs.  He does not ever really get short of breath because he is limited on his ability due to the leg pains that develop.   Past Medical History:  Diagnosis Date  . AAA (abdominal aortic aneurysm) ([place])   . Allergic rhinitis   . Anxiety   . Back pain   . CAD (coronary artery disease)   . Essential hypertension, benign   . Gout   . History of sciatica   . Hyperlipidemia   . Prostate hypertrophy   . PVD (peripheral vascular disease) ([place])   . Seasonal allergies      Family History  Problem Relation Age of Onset  . Hypertension Mother   . Heart disease Mother   . Stroke Mother   . Heart disease Paternal Grandmother   . Diabetes Paternal Grandmother   . Hyperlipidemia Paternal Grandmother   . Diabetes Sister   . Heart disease Sister   . Hyperlipidemia Sister   . Heart disease Brother   . Hyperlipidemia Brother   . Hypertension Brother   . Diabetes Paternal Grandfather   . Heart disease Paternal Grandfather   . Hyperlipidemia Paternal Grandfather   . Heart disease Maternal Uncle        x 2  . Cancer Maternal  Grandmother        type unknown, ? lung  . Leukemia Maternal Uncle      Past Surgical History:  Procedure Laterality Date  . CARDIAC CATHETERIZATION     stent placement  . ILIAC ARTERY STENT      Social History   Socioeconomic History  . Marital status: Married    Spouse name: Not on file  . Number of children: 1  . Years of education: Not on file  . Highest education level: Not on file  Occupational History  . Occupation: retired  Scientific laboratory technician  . Financial resource strain: Not hard at all  . Food insecurity:    Worry: Never true    Inability: Never true  . Transportation needs:    Medical: No    Non-medical: No  Tobacco Use  . Smoking status: Former Smoker    Packs/day: 3.00    Years: 50.00    Pack years: 150.00    Types: Cigarettes    Last attempt to quit: 04/05/2016    Years since quitting: 1.8  . Smokeless tobacco: Never Used  Substance and Sexual Activity  . Alcohol use: No    Alcohol/week: 0.0 standard drinks    Comment: sometimes mixed drinks or beers  .  Drug use: No  . Sexual activity: Not on file  Lifestyle  . Physical activity:    Days per week: 0 days    Minutes per session: 0 min  . Stress: Not at all  Relationships  . Social connections:    Talks on phone: More than three times a week    Gets together: More than three times a week    Attends religious service: Never    Active member of club or organization: No    Attends meetings of clubs or organizations: Never    Relationship status: Married  . Intimate partner violence:    Fear of current or ex partner: No    Emotionally abused: No    Physically abused: No    Forced sexual activity: No  Other Topics Concern  . Not on file  Social History Narrative   Married   Exercise: No   Education: GED     Allergies  Allergen Reactions  . Biaxin [Clarithromycin] Shortness Of Breath  . Clarithromycin Shortness Of Breath  . Iodinated Diagnostic Agents Hives  . Prednisone Other (See Comments)      Patient states he turns REALLY RED all over.  . Cortisone Other (See Comments)    Turns red from breast up  . Flomax [Tamsulosin Hcl]     Blurred vision and lower back pain   . Iodine Hives    Dye from nuclear medicine test     Outpatient Medications Prior to Visit  Medication Sig Dispense Refill  . atorvastatin (LIPITOR) 20 MG tablet TAKE 1 TABLET (20 MG TOTAL) BY MOUTH DAILY AT 6 PM. 90 tablet 1  . clobetasol cream (TEMOVATE) 0.05 %     . clopidogrel (PLAVIX) 75 MG tablet Take 1 tablet (75 mg total) by mouth daily. 90 tablet 2  . clotrimazole (LOTRIMIN) 1 % cream Apply 1 application topically 2 (two) times daily. To affected area on groin. Use for 2 weeks, or at least 2 days after resolution of rash 30 g 0  . famotidine (PEPCID) 20 MG tablet Take 20 mg by mouth 2 (two) times daily.    Marland Kitchen ipratropium (ATROVENT) 0.06 % nasal spray Place 1-2 sprays into both nostrils 4 (four) times daily as needed for rhinitis. (Patient taking differently: Place 1-2 sprays into both nostrils 2 (two) times daily. ) 15 mL 1  . lisinopril (PRINIVIL,ZESTRIL) 10 MG tablet TAKE 1 TABLET BY MOUTH EVERY DAY 90 tablet 1  . lisinopril (PRINIVIL,ZESTRIL) 5 MG tablet TAKE 1 TABLET BY MOUTH EVERY DAY 90 tablet 1  . metoprolol tartrate (LOPRESSOR) 50 MG tablet TAKE 1.5 TABLETS (75 MG TOTAL) BY MOUTH 2 (TWO) TIMES DAILY. 270 tablet 1  . triamcinolone cream (KENALOG) 0.1 % Apply 1 application topically 2 (two) times daily as needed. 30 g 0  . aspirin EC 81 MG tablet Take 81 mg by mouth daily.    Marland Kitchen SALINE NA Place into the nose as needed.     Facility-Administered Medications Prior to Visit  Medication Dose Route Frequency Provider Last Rate Last Dose  . 0.9 %  sodium chloride infusion  500 mL Intravenous Once Irene Shipper, MD        Review of Systems  Constitutional: Negative for chills, fever, malaise/fatigue and weight loss.  HENT: Negative for hearing loss, sore throat and tinnitus.   Eyes: Negative for blurred  vision and double vision.  Respiratory: Positive for hemoptysis. Negative for cough, sputum production, shortness of breath, wheezing and stridor.  Cardiovascular: Negative for chest pain, palpitations, orthopnea, leg swelling and PND.  Gastrointestinal: Negative for abdominal pain, constipation, diarrhea, heartburn, nausea and vomiting.  Genitourinary: Negative for dysuria, hematuria and urgency.  Musculoskeletal: Negative for joint pain and myalgias.  Skin: Negative for itching and rash.  Neurological: Negative for dizziness, tingling, weakness and headaches.  Endo/Heme/Allergies: Negative for environmental allergies. Does not bruise/bleed easily.  Psychiatric/Behavioral: Negative for depression. The patient is not nervous/anxious and does not have insomnia.   All other systems reviewed and are negative.    Objective:  Physical Exam Vitals signs reviewed.  Constitutional:      General: He is not in acute distress.    Appearance: He is well-developed.  HENT:     Head: Normocephalic and atraumatic.  Eyes:     General: No scleral icterus.    Conjunctiva/sclera: Conjunctivae normal.     Pupils: Pupils are equal, round, and reactive to light.  Neck:     Musculoskeletal: Neck supple.     Vascular: No JVD.     Trachea: No tracheal deviation.  Cardiovascular:     Rate and Rhythm: Normal rate and regular rhythm.     Heart sounds: Normal heart sounds. No murmur.  Pulmonary:     Effort: Pulmonary effort is normal. No tachypnea, accessory muscle usage or respiratory distress.     Breath sounds: No stridor. No wheezing, rhonchi or rales.     Comments: Diminished breath sounds bilaterally Abdominal:     General: Bowel sounds are normal. There is no distension.     Palpations: Abdomen is soft.     Tenderness: There is no abdominal tenderness.  Musculoskeletal:        General: No tenderness.  Lymphadenopathy:     Cervical: No cervical adenopathy.  Skin:    General: Skin is warm and  dry.     Capillary Refill: Capillary refill takes less than 2 seconds.     Findings: No rash.  Neurological:     Mental Status: He is alert and oriented to person, place, and time.  Psychiatric:        Behavior: Behavior normal.      Vitals:   02/20/18 0940  BP: 126/66  Pulse: 73  SpO2: 97%  Weight: 212 lb 6.4 oz (96.3 kg)  Height: 6' (1.829 m)   97% on RA BMI Readings from Last 3 Encounters:  02/20/18 28.81 kg/m  01/07/18 29.00 kg/m  12/24/17 28.75 kg/m   Wt Readings from Last 3 Encounters:  02/20/18 212 lb 6.4 oz (96.3 kg)  01/07/18 213 lb 12.8 oz (97 kg)  12/24/17 212 lb (96.2 kg)     CBC    Component Value Date/Time   WBC 9.2 12/24/2017 1551   RBC 5.28 12/24/2017 1551   HGB 16.1 (A) 12/24/2017 1551   HCT 48.7 (A) 12/24/2017 1551   MCV 92.1 12/24/2017 1551   MCH 30.5 12/24/2017 1551   MCHC 33.1 12/24/2017 1551    Chest Imaging: CT Chest 02/14/2018: IMPRESSION: 1. 2.1 cm central lingular mass with associated atelectasis highly suspicious for primary malignancy. Equivocal enlarged LEFT hilar lymph node. 2. Biapical pleural-parenchymal opacities most suggestive of scarring. Recommend attention on future scans or PET CT. 3. Aortic Atherosclerosis (ICD10-I70.0) and Emphysema (ICD10-J43.9).  Pulmonary Functions Testing Results: No flowsheet data found.  FeNO: None   Pathology: None   Echocardiogram: None  Heart Catheterization: None     Assessment & Plan:   Solitary lung nodule - Plan: Ambulatory referral to Pulmonology, Pulmonary  function test, CBC w/Diff, CBC w/Diff  Tobacco use disorder  Hilar adenopathy - Plan: Ambulatory referral to Pulmonology, CBC w/Diff, Comprehensive metabolic panel, CBC w/Diff, Comprehensive metabolic panel  Abnormal findings on diagnostic imaging of lung - Plan: NM PET Image Initial (PI) Skull Base To Thigh  Pre-operative clearance - Plan: CBC w/Diff, Comprehensive metabolic panel, CBC w/Diff, Comprehensive  metabolic panel  Platelet inhibition due to Plavix  Long term current use of antithrombotics/antiplatelets  Discussion:  This is a 73 year old gentleman with lung nodule in the left chest as well as associated left hilar adenopathy as seen on CT imaging.  This is concerning for a primary bronchogenic carcinoma.  Of note the patient is on Plavix after having peripheral stent placement.  He follows with cardiology at [facility].  Today in the office we discussed the risks, benefits and alternatives of proceeding with tissue sampling for diagnosis of concern of lung cancer.  Patient is in agreement with proceeding with tissue diagnosis.  We discussed options to include navigational bronchoscopy, endobronchial ultrasound-guided transbronchial needle aspirations of the left hilar lymph node as well as considerations for percutaneous approach with a referral to interventional radiology.  After discussing these risks, benefits and alternatives the decision was made to proceed with outpatient bronchoscopy.  We will plan for next Wednesday, January 22 to complete a electromagnetic navigational bronchoscopy and curvilinear endobronchial ultrasound transbronchial needle aspirations of the left hilum and radial endobronchial ultrasound to the peripheral lung lesion.  This will allow [place] to complete tissue diagnosis and staging.  We discussed that we are able to obtain a tissue diagnosis in approximately 70% of these cases.  There is a chance that even undergoing the procedure we would not be able to obtain diagnosis and may need to pursue alternative routes.  In the meantime we will need to order the following: PFTs to be completed ASAP We will also need a PET scan to be completed for further staging. Patient will need to hold his Plavix starting today. We will fax clearance forms for this and inform his cardiology service at [facility]. We will obtain his preop labs.  Greater than 50% of this  patient's 60-minute office visit was spent face-to-face discussing the above recommendations and treatment plan as outlined above in the diagnostic algorithm including counseling on the above procedures.    Current Outpatient Medications:  .  atorvastatin (LIPITOR) 20 MG tablet, TAKE 1 TABLET (20 MG TOTAL) BY MOUTH DAILY AT 6 PM., Disp: 90 tablet, Rfl: 1 .  clobetasol cream (TEMOVATE) 0.05 %, , Disp: , Rfl:  .  clopidogrel (PLAVIX) 75 MG tablet, Take 1 tablet (75 mg total) by mouth daily., Disp: 90 tablet, Rfl: 2 .  clotrimazole (LOTRIMIN) 1 % cream, Apply 1 application topically 2 (two) times daily. To affected area on groin. Use for 2 weeks, or at least 2 days after resolution of rash, Disp: 30 g, Rfl: 0 .  famotidine (PEPCID) 20 MG tablet, Take 20 mg by mouth 2 (two) times daily., Disp: , Rfl:  .  ipratropium (ATROVENT) 0.06 % nasal spray, Place 1-2 sprays into both nostrils 4 (four) times daily as needed for rhinitis. (Patient taking differently: Place 1-2 sprays into both nostrils 2 (two) times daily. ), Disp: 15 mL, Rfl: 1 .  lisinopril (PRINIVIL,ZESTRIL) 10 MG tablet, TAKE 1 TABLET BY MOUTH EVERY DAY, Disp: 90 tablet, Rfl: 1 .  lisinopril (PRINIVIL,ZESTRIL) 5 MG tablet, TAKE 1 TABLET BY MOUTH EVERY DAY, Disp: 90 tablet, Rfl: 1 .  metoprolol tartrate (LOPRESSOR) 50 MG tablet, TAKE 1.5 TABLETS (75 MG TOTAL) BY MOUTH 2 (TWO) TIMES DAILY., Disp: 270 tablet, Rfl: 1 .  triamcinolone cream (KENALOG) 0.1 %, Apply 1 application topically 2 (two) times daily as needed., Disp: 30 g, Rfl: 0 .  aspirin EC 81 MG tablet, Take 81 mg by mouth daily., Disp: , Rfl:   Current Facility-Administered Medications:  .  0.9 %  sodium chloride infusion, 500 mL, Intravenous, Once, Irene Shipper, MD   Garner Nash, DO [facility] 02/20/2018 9:52 AM

## 2018-02-20 NOTE — H&P (View-Only) (Signed)
Synopsis: Referred in Jan 2020 for lung nodule by [name], MD  Subjective:   PATIENT ID: [name] GENDER: male DOB: 22-Dec-1945, MRN: [id number]  Chief Complaint  Patient presents with  . Consult    Consult for lung nodule.     PMH PVD, s/p peripheral stent, on plavix, hemoptysis back in November.  He had chest x-ray completed that time which revealed an opacity within the left chest.  He subsequently had difficulty getting his CT of the chest approved.  Finally he had CT chest of imaging approved in January 2020.  This imaging revealed a left lung mass as well as an enlarged hilar adenopathy.  Patient was referred to pulmonary for evaluation.  He has been a longtime smoker quit 2 years ago.  He has never been diagnosed with COPD.  Otherwise his breathing is about stable.  Most of his activities of daily been is limited due to claudication in the legs.  He does not ever really get short of breath because he is limited on his ability due to the leg pains that develop.   Past Medical History:  Diagnosis Date  . AAA (abdominal aortic aneurysm) ([place])   . Allergic rhinitis   . Anxiety   . Back pain   . CAD (coronary artery disease)   . Essential hypertension, benign   . Gout   . History of sciatica   . Hyperlipidemia   . Prostate hypertrophy   . PVD (peripheral vascular disease) ([place])   . Seasonal allergies      Family History  Problem Relation Age of Onset  . Hypertension Mother   . Heart disease Mother   . Stroke Mother   . Heart disease Paternal Grandmother   . Diabetes Paternal Grandmother   . Hyperlipidemia Paternal Grandmother   . Diabetes Sister   . Heart disease Sister   . Hyperlipidemia Sister   . Heart disease Brother   . Hyperlipidemia Brother   . Hypertension Brother   . Diabetes Paternal Grandfather   . Heart disease Paternal Grandfather   . Hyperlipidemia Paternal Grandfather   . Heart disease Maternal Uncle        x 2  . Cancer Maternal  Grandmother        type unknown, ? lung  . Leukemia Maternal Uncle      Past Surgical History:  Procedure Laterality Date  . CARDIAC CATHETERIZATION     stent placement  . ILIAC ARTERY STENT      Social History   Socioeconomic History  . Marital status: Married    Spouse name: Not on file  . Number of children: 1  . Years of education: Not on file  . Highest education level: Not on file  Occupational History  . Occupation: retired  Scientific laboratory technician  . Financial resource strain: Not hard at all  . Food insecurity:    Worry: Never true    Inability: Never true  . Transportation needs:    Medical: No    Non-medical: No  Tobacco Use  . Smoking status: Former Smoker    Packs/day: 3.00    Years: 50.00    Pack years: 150.00    Types: Cigarettes    Last attempt to quit: 04/05/2016    Years since quitting: 1.8  . Smokeless tobacco: Never Used  Substance and Sexual Activity  . Alcohol use: No    Alcohol/week: 0.0 standard drinks    Comment: sometimes mixed drinks or beers  .  Drug use: No  . Sexual activity: Not on file  Lifestyle  . Physical activity:    Days per week: 0 days    Minutes per session: 0 min  . Stress: Not at all  Relationships  . Social connections:    Talks on phone: More than three times a week    Gets together: More than three times a week    Attends religious service: Never    Active member of club or organization: No    Attends meetings of clubs or organizations: Never    Relationship status: Married  . Intimate partner violence:    Fear of current or ex partner: No    Emotionally abused: No    Physically abused: No    Forced sexual activity: No  Other Topics Concern  . Not on file  Social History Narrative   Married   Exercise: No   Education: GED     Allergies  Allergen Reactions  . Biaxin [Clarithromycin] Shortness Of Breath  . Clarithromycin Shortness Of Breath  . Iodinated Diagnostic Agents Hives  . Prednisone Other (See Comments)      Patient states he turns REALLY RED all over.  . Cortisone Other (See Comments)    Turns red from breast up  . Flomax [Tamsulosin Hcl]     Blurred vision and lower back pain   . Iodine Hives    Dye from nuclear medicine test     Outpatient Medications Prior to Visit  Medication Sig Dispense Refill  . atorvastatin (LIPITOR) 20 MG tablet TAKE 1 TABLET (20 MG TOTAL) BY MOUTH DAILY AT 6 PM. 90 tablet 1  . clobetasol cream (TEMOVATE) 0.05 %     . clopidogrel (PLAVIX) 75 MG tablet Take 1 tablet (75 mg total) by mouth daily. 90 tablet 2  . clotrimazole (LOTRIMIN) 1 % cream Apply 1 application topically 2 (two) times daily. To affected area on groin. Use for 2 weeks, or at least 2 days after resolution of rash 30 g 0  . famotidine (PEPCID) 20 MG tablet Take 20 mg by mouth 2 (two) times daily.    Marland Kitchen ipratropium (ATROVENT) 0.06 % nasal spray Place 1-2 sprays into both nostrils 4 (four) times daily as needed for rhinitis. (Patient taking differently: Place 1-2 sprays into both nostrils 2 (two) times daily. ) 15 mL 1  . lisinopril (PRINIVIL,ZESTRIL) 10 MG tablet TAKE 1 TABLET BY MOUTH EVERY DAY 90 tablet 1  . lisinopril (PRINIVIL,ZESTRIL) 5 MG tablet TAKE 1 TABLET BY MOUTH EVERY DAY 90 tablet 1  . metoprolol tartrate (LOPRESSOR) 50 MG tablet TAKE 1.5 TABLETS (75 MG TOTAL) BY MOUTH 2 (TWO) TIMES DAILY. 270 tablet 1  . triamcinolone cream (KENALOG) 0.1 % Apply 1 application topically 2 (two) times daily as needed. 30 g 0  . aspirin EC 81 MG tablet Take 81 mg by mouth daily.    Marland Kitchen SALINE NA Place into the nose as needed.     Facility-Administered Medications Prior to Visit  Medication Dose Route Frequency Provider Last Rate Last Dose  . 0.9 %  sodium chloride infusion  500 mL Intravenous Once Irene Shipper, MD        Review of Systems  Constitutional: Negative for chills, fever, malaise/fatigue and weight loss.  HENT: Negative for hearing loss, sore throat and tinnitus.   Eyes: Negative for blurred  vision and double vision.  Respiratory: Positive for hemoptysis. Negative for cough, sputum production, shortness of breath, wheezing and stridor.  Cardiovascular: Negative for chest pain, palpitations, orthopnea, leg swelling and PND.  Gastrointestinal: Negative for abdominal pain, constipation, diarrhea, heartburn, nausea and vomiting.  Genitourinary: Negative for dysuria, hematuria and urgency.  Musculoskeletal: Negative for joint pain and myalgias.  Skin: Negative for itching and rash.  Neurological: Negative for dizziness, tingling, weakness and headaches.  Endo/Heme/Allergies: Negative for environmental allergies. Does not bruise/bleed easily.  Psychiatric/Behavioral: Negative for depression. The patient is not nervous/anxious and does not have insomnia.   All other systems reviewed and are negative.    Objective:  Physical Exam Vitals signs reviewed.  Constitutional:      General: He is not in acute distress.    Appearance: He is well-developed.  HENT:     Head: Normocephalic and atraumatic.  Eyes:     General: No scleral icterus.    Conjunctiva/sclera: Conjunctivae normal.     Pupils: Pupils are equal, round, and reactive to light.  Neck:     Musculoskeletal: Neck supple.     Vascular: No JVD.     Trachea: No tracheal deviation.  Cardiovascular:     Rate and Rhythm: Normal rate and regular rhythm.     Heart sounds: Normal heart sounds. No murmur.  Pulmonary:     Effort: Pulmonary effort is normal. No tachypnea, accessory muscle usage or respiratory distress.     Breath sounds: No stridor. No wheezing, rhonchi or rales.     Comments: Diminished breath sounds bilaterally Abdominal:     General: Bowel sounds are normal. There is no distension.     Palpations: Abdomen is soft.     Tenderness: There is no abdominal tenderness.  Musculoskeletal:        General: No tenderness.  Lymphadenopathy:     Cervical: No cervical adenopathy.  Skin:    General: Skin is warm and  dry.     Capillary Refill: Capillary refill takes less than 2 seconds.     Findings: No rash.  Neurological:     Mental Status: He is alert and oriented to person, place, and time.  Psychiatric:        Behavior: Behavior normal.      Vitals:   02/20/18 0940  BP: 126/66  Pulse: 73  SpO2: 97%  Weight: 212 lb 6.4 oz (96.3 kg)  Height: 6' (1.829 m)   97% on RA BMI Readings from Last 3 Encounters:  02/20/18 28.81 kg/m  01/07/18 29.00 kg/m  12/24/17 28.75 kg/m   Wt Readings from Last 3 Encounters:  02/20/18 212 lb 6.4 oz (96.3 kg)  01/07/18 213 lb 12.8 oz (97 kg)  12/24/17 212 lb (96.2 kg)     CBC    Component Value Date/Time   WBC 9.2 12/24/2017 1551   RBC 5.28 12/24/2017 1551   HGB 16.1 (A) 12/24/2017 1551   HCT 48.7 (A) 12/24/2017 1551   MCV 92.1 12/24/2017 1551   MCH 30.5 12/24/2017 1551   MCHC 33.1 12/24/2017 1551    Chest Imaging: CT Chest 02/14/2018: IMPRESSION: 1. 2.1 cm central lingular mass with associated atelectasis highly suspicious for primary malignancy. Equivocal enlarged LEFT hilar lymph node. 2. Biapical pleural-parenchymal opacities most suggestive of scarring. Recommend attention on future scans or PET CT. 3. Aortic Atherosclerosis (ICD10-I70.0) and Emphysema (ICD10-J43.9).  Pulmonary Functions Testing Results: No flowsheet data found.  FeNO: None   Pathology: None   Echocardiogram: None  Heart Catheterization: None     Assessment & Plan:   Solitary lung nodule - Plan: Ambulatory referral to Pulmonology, Pulmonary  function test, CBC w/Diff, CBC w/Diff  Tobacco use disorder  Hilar adenopathy - Plan: Ambulatory referral to Pulmonology, CBC w/Diff, Comprehensive metabolic panel, CBC w/Diff, Comprehensive metabolic panel  Abnormal findings on diagnostic imaging of lung - Plan: NM PET Image Initial (PI) Skull Base To Thigh  Pre-operative clearance - Plan: CBC w/Diff, Comprehensive metabolic panel, CBC w/Diff, Comprehensive  metabolic panel  Platelet inhibition due to Plavix  Long term current use of antithrombotics/antiplatelets  Discussion:  This is a 73 year old gentleman with lung nodule in the left chest as well as associated left hilar adenopathy as seen on CT imaging.  This is concerning for a primary bronchogenic carcinoma.  Of note the patient is on Plavix after having peripheral stent placement.  He [facility] with [facility]ology at [facility].  Today in the office we discussed the risks, benefits and alternatives of proceeding with tissue sampling for diagnosis of concern of lung cancer.  Patient is in agreement with proceeding with tissue diagnosis.  We discussed options to include navigational bronchoscopy, endobronchial ultrasound-guided transbronchial needle aspirations of the left hilar lymph node as well as considerations for percutaneous approach with a referral to interventional radiology.  After discussing these risks, benefits and alternatives the decision was made to proceed with outpatient bronchoscopy.  We will plan for next Wednesday, January 22 to complete a electromagnetic navigational bronchoscopy and curvilinear endobronchial ultrasound transbronchial needle aspirations of the left hilum and radial endobronchial ultrasound to the peripheral lung lesion.  This will allow [place] to complete tissue diagnosis and staging.  We discussed that we are able to obtain a tissue diagnosis in approximately 70% of these cases.  There is a chance that even undergoing the procedure we would not be able to obtain diagnosis and may need to pursue alternative routes.  In the meantime we will need to order the following: PFTs to be completed ASAP We will also need a PET scan to be completed for further staging. Patient will need to hold his Plavix starting today. We will fax clearance forms for this and inform his cardiology service at [facility]. We will obtain his preop labs.  Greater than 50% of this  patient's 60-minute office visit was spent face-to-face discussing the above recommendations and treatment plan as outlined above in the diagnostic algorithm including counseling on the above procedures.    Current Outpatient Medications:  .  atorvastatin (LIPITOR) 20 MG tablet, TAKE 1 TABLET (20 MG TOTAL) BY MOUTH DAILY AT 6 PM., Disp: 90 tablet, Rfl: 1 .  clobetasol cream (TEMOVATE) 0.05 %, , Disp: , Rfl:  .  clopidogrel (PLAVIX) 75 MG tablet, Take 1 tablet (75 mg total) by mouth daily., Disp: 90 tablet, Rfl: 2 .  clotrimazole (LOTRIMIN) 1 % cream, Apply 1 application topically 2 (two) times daily. To affected area on groin. Use for 2 weeks, or at least 2 days after resolution of rash, Disp: 30 g, Rfl: 0 .  famotidine (PEPCID) 20 MG tablet, Take 20 mg by mouth 2 (two) times daily., Disp: , Rfl:  .  ipratropium (ATROVENT) 0.06 % nasal spray, Place 1-2 sprays into both nostrils 4 (four) times daily as needed for rhinitis. (Patient taking differently: Place 1-2 sprays into both nostrils 2 (two) times daily. ), Disp: 15 mL, Rfl: 1 .  lisinopril (PRINIVIL,ZESTRIL) 10 MG tablet, TAKE 1 TABLET BY MOUTH EVERY DAY, Disp: 90 tablet, Rfl: 1 .  lisinopril (PRINIVIL,ZESTRIL) 5 MG tablet, TAKE 1 TABLET BY MOUTH EVERY DAY, Disp: 90 tablet, Rfl: 1 .  metoprolol tartrate (LOPRESSOR) 50 MG tablet, TAKE 1.5 TABLETS (75 MG TOTAL) BY MOUTH 2 (TWO) TIMES DAILY., Disp: 270 tablet, Rfl: 1 .  triamcinolone cream (KENALOG) 0.1 %, Apply 1 application topically 2 (two) times daily as needed., Disp: 30 g, Rfl: 0 .  aspirin EC 81 MG tablet, Take 81 mg by mouth daily., Disp: , Rfl:   Current Facility-Administered Medications:  .  0.9 %  sodium chloride infusion, 500 mL, Intravenous, Once, Irene Shipper, MD   Garner Nash, DO [facility] 02/20/2018 9:52 AM

## 2018-02-21 NOTE — Telephone Encounter (Signed)
Ct disc received. Placed in Dr. Fabio Bering office. Nothing further needed at this time.

## 2018-02-24 ENCOUNTER — Ambulatory Visit (INDEPENDENT_AMBULATORY_CARE_PROVIDER_SITE_OTHER): Payer: Medicare Other | Admitting: Pulmonary Disease

## 2018-02-24 DIAGNOSIS — R911 Solitary pulmonary nodule: Secondary | ICD-10-CM

## 2018-02-24 LAB — PULMONARY FUNCTION TEST
DL/VA % pred: 68 %
DL/VA: 3.21 ml/min/mmHg/L
DLCO cor % pred: 49 %
DLCO cor: 17.51 ml/min/mmHg
DLCO unc % pred: 51 %
DLCO unc: 18.16 ml/min/mmHg
FEF 25-75 Post: 1.25 L/sec
FEF 25-75 Pre: 1.22 L/sec
FEF2575-%Change-Post: 2 %
FEF2575-%Pred-Post: 48 %
FEF2575-%Pred-Pre: 47 %
FEV1-%Change-Post: -15 %
FEV1-%PRED-POST: 57 %
FEV1-%Pred-Pre: 68 %
FEV1-Post: 1.98 L
FEV1-Pre: 2.34 L
FEV1FVC-%CHANGE-POST: -16 %
FEV1FVC-%Pred-Pre: 89 %
FEV6-%CHANGE-POST: 2 %
FEV6-%Pred-Post: 79 %
FEV6-%Pred-Pre: 77 %
FEV6-Post: 3.54 L
FEV6-Pre: 3.45 L
FEV6FVC-%Change-Post: 0 %
FEV6FVC-%Pred-Post: 102 %
FEV6FVC-%Pred-Pre: 101 %
FVC-%CHANGE-POST: 1 %
FVC-%Pred-Post: 77 %
FVC-%Pred-Pre: 76 %
FVC-Post: 3.65 L
FVC-Pre: 3.59 L
Post FEV1/FVC ratio: 54 %
Post FEV6/FVC ratio: 97 %
Pre FEV1/FVC ratio: 65 %
Pre FEV6/FVC Ratio: 96 %
RV % pred: 62 %
RV: 1.62 L
TLC % pred: 83 %
TLC: 6.17 L

## 2018-02-24 NOTE — Pre-Procedure Instructions (Addendum)
Seth [name]  02/24/2018      [id number] [address], [place] [address] [place] [address] Phone: [phone] Fax: [phone]    Your procedure is scheduled on January 22nd.  Report to [facility] Admitting at 12:00 P.M.  Call this number if you have problems the morning of surgery:  [phone]   Remember:  Do not eat or drink after midnight.    Take these medicines the morning of surgery with A SIP OF WATER  Pepcid - if needed   Metoprolol   Follow your surgeon's instructions on when to stop Asprin.  If no instructions were given by your surgeon then you will need to call the office to get those instructions.    7 days prior to surgery STOP taking any Aspirin (unless otherwise instructed by your surgeon), Aleve, Naproxen, Ibuprofen, Motrin, Advil, Goody's, BC's, all herbal medications, fish oil, and all vitamins.     Do not wear jewelry.  Do not wear lotions, powders, colognes, or deodorant.  Men may shave face and neck.  Do not bring valuables to the hospital.  [facility] is not responsible for any belongings or valuables.   [place]- Preparing For Surgery  Before surgery, you can play an important role. Because skin is not sterile, your skin needs to be as free of germs as possible. You can reduce the number of germs on your skin by washing with CHG (chlorahexidine gluconate) Soap before surgery.  CHG is an antiseptic cleaner which kills germs and bonds with the skin to continue killing germs even after washing.    Oral Hygiene is also important to reduce your risk of infection.  Remember - BRUSH YOUR TEETH THE MORNING OF SURGERY WITH YOUR REGULAR TOOTHPASTE  Please do not use if you have an allergy to CHG or antibacterial soaps. If your skin becomes reddened/irritated stop using the CHG.  Do not shave (including legs and underarms) for at least 48 hours prior to first CHG shower. It is OK to shave your  face.  Please follow these instructions carefully.   1. Shower the NIGHT BEFORE SURGERY and the MORNING OF SURGERY with CHG.   2. If you chose to wash your hair, wash your hair first as usual with your normal shampoo.  3. After you shampoo, rinse your hair and body thoroughly to remove the shampoo.  4. Use CHG as you would any other liquid soap. You can apply CHG directly to the skin and wash gently with a scrungie or a clean washcloth.   5. Apply the CHG Soap to your body ONLY FROM THE NECK DOWN.  Do not use on open wounds or open sores. Avoid contact with your eyes, ears, mouth and genitals (private parts). Wash Face and genitals (private parts)  with your normal soap.  6. Wash thoroughly, paying special attention to the area where your surgery will be performed.  7. Thoroughly rinse your body with warm water from the neck down.  8. DO NOT shower/wash with your normal soap after using and rinsing off the CHG Soap.  9. Pat yourself dry with a CLEAN TOWEL.  10. Wear CLEAN PAJAMAS to bed the night before surgery, wear comfortable clothes the morning of surgery  11. Place CLEAN SHEETS on your bed the night of your first shower and DO NOT SLEEP WITH PETS.   Day of Surgery:  Do not apply any deodorants/lotions.  Please wear clean clothes  to the hospital/surgery center.   Remember to brush your teeth WITH YOUR REGULAR TOOTHPASTE.   Contacts, dentures or bridgework may not be worn into surgery.  Leave your suitcase in the car.  After surgery it may be brought to your room.  For patients admitted to the hospital, discharge time will be determined by your treatment team.  Patients discharged the day of surgery will not be allowed to drive home.

## 2018-02-24 NOTE — Progress Notes (Signed)
Full PFT performed today. °

## 2018-02-25 ENCOUNTER — Other Ambulatory Visit: Payer: Self-pay

## 2018-02-25 ENCOUNTER — Encounter (HOSPITAL_COMMUNITY)
Admission: RE | Admit: 2018-02-25 | Discharge: 2018-02-25 | Disposition: A | Discharge status: Home / Self Care | Payer: Medicare Other | Source: Ambulatory Visit | Attending: Pulmonary Disease | Admitting: Pulmonary Disease

## 2018-02-25 ENCOUNTER — Encounter (HOSPITAL_COMMUNITY): Payer: Self-pay

## 2018-02-25 DIAGNOSIS — J439 Emphysema, unspecified: Secondary | ICD-10-CM | POA: Diagnosis not present

## 2018-02-25 DIAGNOSIS — F419 Anxiety disorder, unspecified: Secondary | ICD-10-CM | POA: Diagnosis not present

## 2018-02-25 DIAGNOSIS — I10 Essential (primary) hypertension: Secondary | ICD-10-CM

## 2018-02-25 DIAGNOSIS — Z01818 Encounter for other preprocedural examination: Secondary | ICD-10-CM | POA: Insufficient documentation

## 2018-02-25 DIAGNOSIS — R001 Bradycardia, unspecified: Secondary | ICD-10-CM | POA: Insufficient documentation

## 2018-02-25 DIAGNOSIS — M109 Gout, unspecified: Secondary | ICD-10-CM | POA: Diagnosis not present

## 2018-02-25 DIAGNOSIS — Z79899 Other long term (current) drug therapy: Secondary | ICD-10-CM | POA: Diagnosis not present

## 2018-02-25 DIAGNOSIS — Z7982 Long term (current) use of aspirin: Secondary | ICD-10-CM | POA: Diagnosis not present

## 2018-02-25 DIAGNOSIS — E785 Hyperlipidemia, unspecified: Secondary | ICD-10-CM | POA: Diagnosis not present

## 2018-02-25 DIAGNOSIS — I700 Atherosclerosis of aorta: Secondary | ICD-10-CM | POA: Diagnosis not present

## 2018-02-25 DIAGNOSIS — R590 Localized enlarged lymph nodes: Secondary | ICD-10-CM | POA: Diagnosis not present

## 2018-02-25 DIAGNOSIS — Z87891 Personal history of nicotine dependence: Secondary | ICD-10-CM | POA: Diagnosis not present

## 2018-02-25 DIAGNOSIS — Z7902 Long term (current) use of antithrombotics/antiplatelets: Secondary | ICD-10-CM | POA: Diagnosis not present

## 2018-02-25 DIAGNOSIS — N400 Enlarged prostate without lower urinary tract symptoms: Secondary | ICD-10-CM | POA: Diagnosis not present

## 2018-02-25 DIAGNOSIS — I2510 Atherosclerotic heart disease of native coronary artery without angina pectoris: Secondary | ICD-10-CM | POA: Diagnosis not present

## 2018-02-25 HISTORY — DX: Other transient cerebral ischemic attacks and related syndromes: G45.8

## 2018-02-25 HISTORY — DX: Pneumonia, unspecified organism: J18.9

## 2018-02-25 LAB — COMPREHENSIVE METABOLIC PANEL
AST: 16 U/L (ref 15–41)
Albumin: 3.9 g/dL (ref 3.5–5.0)
Alkaline Phosphatase: 80 U/L (ref 38–126)
Anion gap: 10 (ref 5–15)
BUN: 11 mg/dL (ref 8–23)
CALCIUM: 9.2 mg/dL (ref 8.9–10.3)
CHLORIDE: 105 mmol/L (ref 98–111)
CO2: 22 mmol/L (ref 22–32)
CREATININE: 1.38 mg/dL — AB (ref 0.61–1.24)
GFR calc Af Amer: 59 mL/min — ABNORMAL LOW (ref >60)
GFR calc non Af Amer: 51 mL/min — ABNORMAL LOW (ref >60)
Glucose, Bld: 114 mg/dL — ABNORMAL HIGH (ref 70–99)
Potassium: 4.3 mmol/L (ref 3.5–5.1)
Sodium: 137 mmol/L (ref 135–145)
Total Bilirubin: 0.7 mg/dL (ref 0.3–1.2)
Total Protein: UNDETERMINED g/dL (ref 6.5–8.1)

## 2018-02-25 LAB — CBC
HCT: 50.5 % (ref 39.0–52.0)
Hemoglobin: 15.9 g/dL (ref 13.0–17.0)
MCH: 29.7 pg (ref 26.0–34.0)
MCHC: 31.5 g/dL (ref 30.0–36.0)
MCV: 94.2 fL (ref 80.0–100.0)
Platelets: 193 10*3/uL (ref 150–400)
RBC: 5.36 MIL/uL (ref 4.22–5.81)
RDW: 13.3 % (ref 11.5–15.5)
WBC: 7.6 10*3/uL (ref 4.0–10.5)
nRBC: 0 % (ref 0.0–0.2)

## 2018-02-25 LAB — PROTIME-INR
INR: 1.03
Prothrombin Time: 13.4 seconds (ref 11.4–15.2)

## 2018-02-25 LAB — APTT: APTT: 29 s (ref 24–36)

## 2018-02-25 NOTE — Anesthesia Preprocedure Evaluation (Addendum)
Anesthesia Evaluation  Patient identified by MRN, date of birth, ID band Patient awake    Reviewed: Allergy & Precautions, NPO status , Patient's Chart, lab work & pertinent test results  Airway Mallampati: I  TM Distance: >3 FB Neck ROM: Full    Dental  (+) Edentulous Upper, Edentulous Lower   Pulmonary former smoker,    breath sounds clear to auscultation       Cardiovascular hypertension, Pt. on medications and Pt. on home beta blockers + CAD and + Peripheral Vascular Disease   Rhythm:Regular Rate:Normal     Neuro/Psych Anxiety    GI/Hepatic Neg liver ROS, GERD  Medicated,  Endo/Other  negative endocrine ROS  Renal/GU negative Renal ROS     Musculoskeletal negative musculoskeletal ROS (+)   Abdominal Normal abdominal exam  (+)   Peds  Hematology negative hematology ROS (+)   Anesthesia Other Findings - HLD  Reproductive/Obstetrics                           Anesthesia Physical Anesthesia Plan  ASA: III  Anesthesia Plan: General   Post-op Pain Management:    Induction: Intravenous  PONV Risk Score and Plan: 3 and Ondansetron, Dexamethasone and Treatment may vary due to age or medical condition  Airway Management Planned: Oral ETT  Additional Equipment: None  Intra-op Plan:   Post-operative Plan: Extubation in OR  Informed Consent:   Plan Discussed with: CRNA  Anesthesia Plan Comments: (See PAT note written 02/25/2018 by Myra Gianotti, PA-C. Mild-moderate CAD 04/2012. Saw Dr. Marlou Porch 10/2013. PAD followed by vascular surgery in [place].   Explained cardiac risk in detail with patient and family. )      Anesthesia Quick Evaluation

## 2018-02-25 NOTE — Progress Notes (Signed)
Anesthesia Chart Review:  Case:  [id number] Date/Time:  02/26/18 1348   Procedures:      VIDEO BRONCHOSCOPY WITH ENDOBRONCHIAL ULTRASOUND (N/A )     VIDEO BRONCHOSCOPY WITH ENDOBRONCHIAL NAVIGATION (N/A )   Anesthesia type:  General   Pre-op diagnosis:  LUNG MASS   Location:  [facility] OR ROOM 10 / [place] [facility]   Surgeon:  Garner Nash, DO      DISCUSSION: Patient is a 72 year old male scheduled for the above procedure. PAT visit was 02/25/18. He was seen by Dr. Valeta Harms on 02/21/08 for lung nodule with episode of hemoptysis in 12/2017. Imaging showed a left lung mass with hilar adenopathy. There is concern for lung cancer and biopsy needed for definitive diagnosis.   History includes former smoker (quit 04/05/16), CAD (mild-moderate 2014), HTN, AAA (4.39 cm 12/2017), PVD (s/p bilateral CIA stents, Dr. Maryjean Morn, [facility]), carotid artery stenosis (BICA 21-39% 12/2017), left subclavian steal, HLD.   One year follow-up recommended in December following vascular surgery visit ([place]).  Mild-moderate CAD (most was 40% LAD) by 2014 cath. Last saw cardiologist Dr. Marlou Porch in 2015.   Patient reported Plavix on hold for procedure. He is on Plavix primarily for PAD. He denied SOB, cough, fever, chest pain. Activity is limited by claudication.   Reviewed above with anesthesiologist Oren Bracket, MD. Anesthesiologist to evaluate on the day of surgery. If preoperative cardiology input felt warranted then would need to consult at that time. Will send Dr. Valeta Harms a staff message with update.   VS: BP (!) 143/72   Pulse 76   Temp 36.5 C   Resp 20   Ht 6' (1.829 m)   Wt 97.4 kg   SpO2 100%   BMI 29.13 kg/m    PROVIDERS: [name], MD is PCP - Denyce Robert, [facility] is vascular surgeon in [facility] (see [place]). Last visit 01/08/18 with Retia Passe, PA-C. One year follow-up recommended. Sherral Hammers, MD is cardiologist. Last visit 10/15/13. Continued aggressive secondary  prevention for CAD recommended. One year follow-up planned.    LABS: Labs reviewed: Acceptable for surgery. (all labs ordered are listed, but only abnormal results are displayed)  Labs Reviewed  COMPREHENSIVE METABOLIC PANEL - Abnormal; Notable for the following components:      Result Value   Glucose, Bld 114 (*)    Creatinine, Ser 1.38 (*)    GFR calc non Af Amer 51 (*)    GFR calc Af Amer 59 (*)    All other components within normal limits  APTT  CBC  PROTIME-INR    PFTs 02/24/18: FVC 3.59 76%, post 3.65 77%. FEV1 2.34 68%, post 1.98 57%. DLCO unc 18.16 51%.    IMAGES: CT Chest 02/14/18: IMPRESSION: 1. 2.1 cm central lingular mass with associated atelectasis highly suspicious for primary malignancy. Equivocal enlarged LEFT hilar lymph node. 2. Biapical pleural-parenchymal opacities most suggestive of scarring. Recommend attention on future scans or PET CT. 3. Aortic Atherosclerosis (ICD10-I70.0) and Emphysema (ICD10-J43.9).   EKG: 02/25/18: SB at 52 bpm   CV: 12/31/17 Vascular studies as outlined in 01/08/18 office note ([place]): "Vascular study results:  Studies today essentially unchanged from 1 year ago  Bilateral ABIs around 0.6  AAA measures 4.15 cm x 4.39 cm, stable Right CIA stent is patent with a stable 2.10 aneurysm proximal to the stent Left CIA stent patent with a 1.85 cm aneurysm proximal to the stent  Bilateral ICAs at 21  to 39% Left vertebral is retrograde and left brachial pressure lower compared to right both consistent with his known left subclavian steal, this study essentially unchanged from previous"   Cardiac cath 04/10/12: IMPRESSIONS: 1. Minor to moderate diffuse coronary irregularities but no flow-limiting CAD present. (40% mid LAD, CX/OM mild luminal irregularities, 30-40% proximal RCA) 2. Normal left ventricular systolic function.  LVEDP 15 mmHg.  Ejection fraction 65 %. RECOMMENDATION:  Reassuring, continue with medical  management.  Nuclear stress test 03/06/12:  Impression:  Abnormal myocardial perfusion study (3 times daily-transient ischemic dil).  Normal myocardial perfusion scan demonstrating an attenuation artifact in the inferior region of the myocardium.  No ischemia or infarct/scar seen in the remaining myocardium.  Post-rest EF 68%.   Past Medical History:  Diagnosis Date  . AAA (abdominal aortic aneurysm) ([place])   . Allergic rhinitis   . Anxiety   . Back pain   . CAD (coronary artery disease)   . Essential hypertension, benign   . Gout   . History of sciatica   . Hyperlipidemia   . Pneumonia    as child  . Prostate hypertrophy   . PVD (peripheral vascular disease) ([place])   . Seasonal allergies     Past Surgical History:  Procedure Laterality Date  . CARDIAC CATHETERIZATION     stent placement  . ILIAC ARTERY STENT      MEDICATIONS: . aspirin EC 81 MG tablet  . atorvastatin (LIPITOR) 20 MG tablet  . cetirizine (ZYRTEC) 10 MG tablet  . clobetasol cream (TEMOVATE) 0.05 %  . clopidogrel (PLAVIX) 75 MG tablet  . clotrimazole (LOTRIMIN) 1 % cream  . famotidine (PEPCID) 20 MG tablet  . ipratropium (ATROVENT) 0.06 % nasal spray  . lisinopril (PRINIVIL,ZESTRIL) 10 MG tablet  . lisinopril (PRINIVIL,ZESTRIL) 5 MG tablet  . metoprolol tartrate (LOPRESSOR) 50 MG tablet  . Triamcinolone Acetonide (NASACORT AQ NA)  . triamcinolone cream (KENALOG) 0.1 %   . 0.9 %  sodium chloride infusion    Myra Gianotti, PA-C Surgical Short Stay/Anesthesiology [facility] Phone [phone] [facility] Phone [phone] 02/25/2018 4:45 PM

## 2018-02-25 NOTE — Progress Notes (Signed)
PCP: Janeann Forehand  DM: denies  SA: denies  Pt denies SOB, cough, fever, chest pain @ PAT appt  Pt stated understanding of instructions given for DOS.

## 2018-02-26 ENCOUNTER — Ambulatory Visit (HOSPITAL_COMMUNITY)
Admission: RE | Admit: 2018-02-26 | Discharge: 2018-02-26 | Disposition: A | Discharge status: Home / Self Care | Payer: Medicare Other | Attending: Pulmonary Disease | Admitting: Pulmonary Disease

## 2018-02-26 ENCOUNTER — Encounter (HOSPITAL_COMMUNITY): Payer: Self-pay

## 2018-02-26 ENCOUNTER — Ambulatory Visit (HOSPITAL_COMMUNITY): Payer: Medicare Other | Admitting: Anesthesiology

## 2018-02-26 ENCOUNTER — Ambulatory Visit (HOSPITAL_COMMUNITY): Payer: Medicare Other

## 2018-02-26 ENCOUNTER — Ambulatory Visit (HOSPITAL_COMMUNITY): Payer: Medicare Other | Admitting: Vascular Surgery

## 2018-02-26 ENCOUNTER — Other Ambulatory Visit: Payer: Self-pay

## 2018-02-26 ENCOUNTER — Encounter (HOSPITAL_COMMUNITY)
Admission: RE | Disposition: A | Discharge status: Home / Self Care | Payer: Self-pay | Source: Home / Self Care | Attending: Pulmonary Disease

## 2018-02-26 DIAGNOSIS — I700 Atherosclerosis of aorta: Secondary | ICD-10-CM | POA: Diagnosis not present

## 2018-02-26 DIAGNOSIS — I2510 Atherosclerotic heart disease of native coronary artery without angina pectoris: Secondary | ICD-10-CM | POA: Diagnosis not present

## 2018-02-26 DIAGNOSIS — Z7902 Long term (current) use of antithrombotics/antiplatelets: Secondary | ICD-10-CM | POA: Insufficient documentation

## 2018-02-26 DIAGNOSIS — R590 Localized enlarged lymph nodes: Secondary | ICD-10-CM | POA: Diagnosis not present

## 2018-02-26 DIAGNOSIS — Z9889 Other specified postprocedural states: Secondary | ICD-10-CM | POA: Diagnosis not present

## 2018-02-26 DIAGNOSIS — J439 Emphysema, unspecified: Secondary | ICD-10-CM | POA: Insufficient documentation

## 2018-02-26 DIAGNOSIS — Z7982 Long term (current) use of aspirin: Secondary | ICD-10-CM | POA: Diagnosis not present

## 2018-02-26 DIAGNOSIS — F419 Anxiety disorder, unspecified: Secondary | ICD-10-CM | POA: Diagnosis not present

## 2018-02-26 DIAGNOSIS — R896 Abnormal cytological findings in specimens from other organs, systems and tissues: Secondary | ICD-10-CM | POA: Diagnosis not present

## 2018-02-26 DIAGNOSIS — R918 Other nonspecific abnormal finding of lung field: Secondary | ICD-10-CM | POA: Diagnosis not present

## 2018-02-26 DIAGNOSIS — Z419 Encounter for procedure for purposes other than remedying health state, unspecified: Secondary | ICD-10-CM

## 2018-02-26 DIAGNOSIS — M109 Gout, unspecified: Secondary | ICD-10-CM | POA: Insufficient documentation

## 2018-02-26 DIAGNOSIS — I10 Essential (primary) hypertension: Secondary | ICD-10-CM | POA: Insufficient documentation

## 2018-02-26 DIAGNOSIS — Z79899 Other long term (current) drug therapy: Secondary | ICD-10-CM | POA: Diagnosis not present

## 2018-02-26 DIAGNOSIS — Z87891 Personal history of nicotine dependence: Secondary | ICD-10-CM | POA: Insufficient documentation

## 2018-02-26 DIAGNOSIS — E785 Hyperlipidemia, unspecified: Secondary | ICD-10-CM | POA: Diagnosis not present

## 2018-02-26 DIAGNOSIS — N400 Enlarged prostate without lower urinary tract symptoms: Secondary | ICD-10-CM | POA: Insufficient documentation

## 2018-02-26 DIAGNOSIS — R599 Enlarged lymph nodes, unspecified: Secondary | ICD-10-CM | POA: Diagnosis not present

## 2018-02-26 DIAGNOSIS — R911 Solitary pulmonary nodule: Secondary | ICD-10-CM

## 2018-02-26 HISTORY — PX: VIDEO BRONCHOSCOPY WITH ENDOBRONCHIAL ULTRASOUND: SHX6177

## 2018-02-26 HISTORY — PX: VIDEO BRONCHOSCOPY WITH ENDOBRONCHIAL NAVIGATION: SHX6175

## 2018-02-26 SURGERY — BRONCHOSCOPY, WITH EBUS
Anesthesia: General | Site: Chest

## 2018-02-26 MED ORDER — LACTATED RINGERS IV SOLN
INTRAVENOUS | Status: DC
  Administered 2018-02-26 (×2): via INTRAVENOUS

## 2018-02-26 MED ORDER — SUGAMMADEX SODIUM 200 MG/2ML IV SOLN
INTRAVENOUS | Status: DC | PRN
  Administered 2018-02-26: 200 mg via INTRAVENOUS

## 2018-02-26 MED ORDER — ROCURONIUM BROMIDE 50 MG/5ML IV SOSY
PREFILLED_SYRINGE | INTRAVENOUS | Status: AC
  Filled 2018-02-26: qty 5

## 2018-02-26 MED ORDER — FENTANYL CITRATE (PF) 250 MCG/5ML IJ SOLN
INTRAMUSCULAR | Status: DC | PRN
  Administered 2018-02-26 (×2): 50 ug via INTRAVENOUS

## 2018-02-26 MED ORDER — DIPHENHYDRAMINE HCL 50 MG/ML IJ SOLN
INTRAMUSCULAR | Status: DC | PRN
  Administered 2018-02-26: 25 mg via INTRAVENOUS

## 2018-02-26 MED ORDER — ONDANSETRON HCL 4 MG/2ML IJ SOLN
INTRAMUSCULAR | Status: DC | PRN
  Administered 2018-02-26: 4 mg via INTRAVENOUS

## 2018-02-26 MED ORDER — ONDANSETRON HCL 4 MG/2ML IJ SOLN
INTRAMUSCULAR | Status: AC
  Filled 2018-02-26: qty 2

## 2018-02-26 MED ORDER — ROCURONIUM BROMIDE 50 MG/5ML IV SOSY
PREFILLED_SYRINGE | INTRAVENOUS | Status: DC | PRN
  Administered 2018-02-26: 50 mg via INTRAVENOUS

## 2018-02-26 MED ORDER — DEXAMETHASONE SODIUM PHOSPHATE 10 MG/ML IJ SOLN
INTRAMUSCULAR | Status: AC
  Filled 2018-02-26: qty 1

## 2018-02-26 MED ORDER — FENTANYL CITRATE (PF) 100 MCG/2ML IJ SOLN
INTRAMUSCULAR | Status: AC
  Filled 2018-02-26: qty 2

## 2018-02-26 MED ORDER — LIDOCAINE 2% (20 MG/ML) 5 ML SYRINGE
INTRAMUSCULAR | Status: DC | PRN
  Administered 2018-02-26: 50 mg via INTRAVENOUS

## 2018-02-26 MED ORDER — SODIUM CHLORIDE 0.9 % IV SOLN
INTRAVENOUS | Status: DC | PRN
  Administered 2018-02-26: 20 ug/min via INTRAVENOUS

## 2018-02-26 MED ORDER — 0.9 % SODIUM CHLORIDE (POUR BTL) OPTIME
TOPICAL | Status: DC | PRN
  Administered 2018-02-26: 1000 mL

## 2018-02-26 MED ORDER — LIDOCAINE 2% (20 MG/ML) 5 ML SYRINGE
INTRAMUSCULAR | Status: AC
  Filled 2018-02-26: qty 5

## 2018-02-26 MED ORDER — PROPOFOL 10 MG/ML IV BOLUS
INTRAVENOUS | Status: DC | PRN
  Administered 2018-02-26: 40 mg via INTRAVENOUS
  Administered 2018-02-26: 120 mg via INTRAVENOUS

## 2018-02-26 SURGICAL SUPPLY — 50 items
ADAPTER BRONCHOSCOPE OLYMPUS (ADAPTER) ×6 IMPLANT
ADAPTER VALVE BIOPSY EBUS (MISCELLANEOUS) IMPLANT
ADPTR VALVE BIOPSY EBUS (MISCELLANEOUS)
BRUSH CYTOL CELLEBRITY 1.5X140 (MISCELLANEOUS) ×3 IMPLANT
BRUSH SUPERTRAX BIOPSY (INSTRUMENTS) IMPLANT
BRUSH SUPERTRAX NDL-TIP CYTO (INSTRUMENTS) ×3 IMPLANT
CANISTER SUCT 3000ML PPV (MISCELLANEOUS) ×3 IMPLANT
CHANNEL WORK EXTEND EDGE 180 (KITS) IMPLANT
CHANNEL WORK EXTEND EDGE 90 (KITS) IMPLANT
CONT SPEC 4OZ CLIKSEAL STRL BL (MISCELLANEOUS) ×3 IMPLANT
COVER BACK TABLE 60X90IN (DRAPES) ×3 IMPLANT
COVER WAND RF STERILE (DRAPES) ×3 IMPLANT
FILTER STRAW FLUID ASPIR (MISCELLANEOUS) IMPLANT
FORCEPS BIOP RJ4 1.8 (CUTTING FORCEPS) IMPLANT
FORCEPS BIOP SUPERTRX PREMAR (INSTRUMENTS) ×3 IMPLANT
GAUZE SPONGE 4X4 12PLY STRL (GAUZE/BANDAGES/DRESSINGS) ×3 IMPLANT
GLOVE BIO SURGEON STRL SZ7.5 (GLOVE) ×6 IMPLANT
GLOVE SURG SS PI 7.5 STRL IVOR (GLOVE) ×3 IMPLANT
GOWN STRL REUS W/ TWL LRG LVL3 (GOWN DISPOSABLE) ×2 IMPLANT
GOWN STRL REUS W/TWL LRG LVL3 (GOWN DISPOSABLE) ×4
KIT CLEAN ENDO COMPLIANCE (KITS) ×6 IMPLANT
KIT LOCATABLE GUIDE (CANNULA) IMPLANT
KIT MARKER FIDUCIAL DELIVERY (KITS) IMPLANT
KIT PROCEDURE EDGE 180 (KITS) ×3 IMPLANT
KIT PROCEDURE EDGE 90 (KITS) IMPLANT
KIT TURNOVER KIT B (KITS) ×3 IMPLANT
MARKER SKIN DUAL TIP RULER LAB (MISCELLANEOUS) ×3 IMPLANT
NEEDLE ASPIRATION VIZISHOT 19G (NEEDLE) ×3 IMPLANT
NEEDLE ASPIRATION VIZISHOT 21G (NEEDLE) IMPLANT
NEEDLE SUPERTRX PREMARK BIOPSY (NEEDLE) ×3 IMPLANT
NS IRRIG 1000ML POUR BTL (IV SOLUTION) ×3 IMPLANT
OIL SILICONE PENTAX (PARTS (SERVICE/REPAIRS)) ×3 IMPLANT
PAD ARMBOARD 7.5X6 YLW CONV (MISCELLANEOUS) ×6 IMPLANT
PATCHES PATIENT (LABEL) ×9 IMPLANT
STOPCOCK 4 WAY LG BORE MALE ST (IV SETS) ×3 IMPLANT
SYR 20CC LL (SYRINGE) ×3 IMPLANT
SYR 20ML ECCENTRIC (SYRINGE) ×9 IMPLANT
SYR 30ML LL (SYRINGE) ×3 IMPLANT
SYR 3ML LL SCALE MARK (SYRINGE) IMPLANT
SYR 50ML SLIP (SYRINGE) ×3 IMPLANT
SYR 5ML LL (SYRINGE) ×3 IMPLANT
TOWEL OR 17X24 6PK STRL BLUE (TOWEL DISPOSABLE) ×3 IMPLANT
TRAP SPECIMEN MUCOUS 40CC (MISCELLANEOUS) IMPLANT
TUBE CONNECTING 20'X1/4 (TUBING) ×1
TUBE CONNECTING 20X1/4 (TUBING) ×2 IMPLANT
UNDERPAD 30X30 (UNDERPADS AND DIAPERS) ×3 IMPLANT
VALVE BIOPSY  SINGLE USE (MISCELLANEOUS) ×4
VALVE BIOPSY SINGLE USE (MISCELLANEOUS) ×2 IMPLANT
VALVE SUCTION BRONCHIO DISP (MISCELLANEOUS) ×9 IMPLANT
WATER STERILE IRR 1000ML POUR (IV SOLUTION) ×3 IMPLANT

## 2018-02-26 NOTE — Transfer of Care (Signed)
Immediate Anesthesia Transfer of Care Note  Patient: Seth [name]  Procedure(s) Performed: VIDEO BRONCHOSCOPY WITH ENDOBRONCHIAL ULTRASOUND (N/A Chest) VIDEO BRONCHOSCOPY WITH ENDOBRONCHIAL NAVIGATION (N/A Chest)  Patient Location: PACU  Anesthesia Type:General  Level of Consciousness: awake and alert   Airway & Oxygen Therapy: Patient Spontanous Breathing and Patient connected to nasal cannula oxygen  Post-op Assessment: Report given to RN and Post -op Vital signs reviewed and stable  Post vital signs: Reviewed and stable  Last Vitals:  Vitals Value Taken Time  BP 125/74 02/26/2018  4:33 PM  Temp    Pulse 83 02/26/2018  4:36 PM  Resp 22 02/26/2018  4:36 PM  SpO2 91 % 02/26/2018  4:36 PM  Vitals shown include unvalidated device data.  Last Pain:  Vitals:   02/26/18 1219  TempSrc:   PainSc: 0-No pain         Complications: No apparent anesthesia complications

## 2018-02-26 NOTE — Op Note (Signed)
Video Bronchoscopy with Curvilinear endobronchial Ultrasound, Electromagnetic Navigation, and Peripheral Targeted Radial Endobronchial Ultrasound Procedure Note  Date of Operation: 02/26/2018  Pre-op Diagnosis: LUL Lung mass, hilar adenopathy   Post-op Diagnosis: LUL Lung mass, hilar adenopathy   Surgeon: Garner Nash, DO  Assistants: None   Anesthesia: General endotracheal anesthesia  Operation: Flexible video fiberoptic bronchoscopy, Olympus curvilinear endobronchial ultrasound, Olympus radial endobronchial ultrasound, super dimension electromagnetic navigation  Estimated Blood Loss: Minimal, <2CM   Complications: None   Indications and History: Seth [name] is a 73 y.o. male with a left upper lobe spiculated lung mass with associated hilar adenopathy.  The risks, benefits, complications, treatment options and expected outcomes were discussed with the patient.  The possibilities of pneumothorax, pneumonia, reaction to medication, pulmonary aspiration, perforation of a viscus, bleeding, failure to diagnose a condition and creating a complication requiring transfusion or operation were discussed with the patient who freely signed the consent.    Description of Procedure: The patient was seen in the Preoperative Area, was examined and was deemed appropriate to proceed.  The patient was taken to [facility] OR 10, identified as Seth [name] and the procedure verified as Flexible Video Fiberoptic Bronchoscopy.  A Time Out was held and the above information confirmed.   After being taken to the operating room general anesthesia was initiated and the patient  was orally intubated. The video fiberoptic bronchoscope was introduced via the endotracheal tube and a general inspection was performed which showed scattered bronchial pitting and striations within the distal openings.  Normal left and right lung anatomy no evidence of endobronchial lesion the right upper lobe, right lower lobe and right middle lobe  were all visualized as well as the left upper lobe, lingula which has 3 subdivisions and the left lower lobe. The standard scope was then withdrawn and the endobronchial ultrasound was used to identify and characterize the peritracheal, hilar and bronchial lymph nodes. Inspection showed an enlarged 11 L lymph node that was approximately 2.5 cm in its largest ultrasound cross-section, all other stations were examined.  Station 7 had a very small less then 5 mm lymph node with associated soft tissue/connective tissue no clear target for sampling. Using real-time ultrasound guidance transbronchial needle aspiration biopsies were take from Station 11 L nodes and were sent for cytology.  A total of 10 transbronchial needle aspiration biopsies were obtained. The patient tolerated the procedure well without apparent complications. There was no significant blood loss.  At the conclusion of the curvilinear endobronchial ultrasound we proceeded to super dimension electromagnetic navigational bronchoscopy.  Prior to the date of the procedure a high-resolution CT scan of the chest was performed. Utilizing Magnolia a virtual tracheobronchial tree was generated to allow the creation of distinct navigation pathways to the patient's parenchymal abnormalities. The extendable working channel and locator guide were introduced into the bronchoscope. The distinct navigation pathways prepared prior to this procedure were then utilized to navigate to within 0.8 cm of patient's lesion(s) identified on CT scan.  The location of the lesion appears to be in between the superior portion of the lingula and the most inferior posterior portion of the left upper lobe division.  The best approach was obtained using the 180 degree curved catheter. The extendable working channel was secured into place and the locator guide was withdrawn.  Once the locatable guide was secured in place the Olympus radial endobronchial ultrasound was  passed through the locatable guide to visualize the left upper lobe lesion.  Using the  radial endobronchial ultrasound we were able to appropriate and peripherally target the lesion in combination with fluoroscopy.  Due to the laterality of the lesion from the airway we were only partially able to visualize this with radial ultrasound probe.   Under fluoroscopic guidance transbronchial needle brushings, transbronchial Wang needle biopsies, and transbronchial forceps biopsies were performed to be sent for cytology and pathology.  There was several attempts to readjust the navigation to allow the catheter to straighten out into the location of where the lesion was.  Due to its more proximal state it was difficult to rotate the catheter to where there was a head on straight approach to the lesion.  We completed several brushings, Wang needle biopsies and forcep biopsies after repeat navigation attempts to obtain the best location.  Each of these were confirmed under fluoroscopic guidance.  At the conclusion of the procedure A bronchioalveolar lavage was performed in the left upper lobe lingula and sent for cytology, 120 cc was instilled with moderate return. At the end of the procedure a general airway inspection was performed and there was no evidence of active bleeding. The bronchoscope was removed.  The patient tolerated the procedure well. There was no significant blood loss and there were no obvious complications. A post-procedural chest x-ray is pending.  Samples: 1. Transbronchial needle aspiration biopsies from station 11 L 2. Transbronchial needle brushings from left upper lobe 2. Transbronchial Wang needle biopsies from the upper lobe 3. Transbronchial forceps biopsies from left upper lobe 4. Bronchoalveolar lavage from left upper lobe  Plans:  The patient will be discharged from the PACU to home when recovered from anesthesia and after chest x-ray is reviewed. We will review the cytology,  pathology and microbiology results with the patient when they become available. Outpatient followup will be with Garner Nash, [place], DO [facility] 02/26/2018 4:23 PM  Personal pager: [phone] If unanswered, please page CCM On-call: [phone]

## 2018-02-26 NOTE — Discharge Instructions (Signed)
Flexible Bronchoscopy, Care After This sheet gives you information about how to care for yourself after your procedure. Your health care provider may also give you more specific instructions. If you have problems or questions, contact your health care provider. What can I expect after the procedure? After the procedure, it is common to have the following symptoms for 24-48 hours:  A cough that is worse than it was before the procedure.  A low-grade fever.  A sore throat or hoarse voice.  Small streaks of blood in the mucus from your lungs (sputum), if tissue samples were removed (biopsy). Follow these instructions at home: Eating and drinking  The day after the procedure, return to your normal diet. Driving  Do not drive for 24 hours if you were given a medicine to help you relax (sedative).  Do not drive or use heavy machinery while taking prescription pain medicine. General instructions   Take over-the-counter and prescription medicines only as told by your health care provider.  Return to your normal activities as told by your health care provider. Ask your health care provider what activities are safe for you.  Do not use any products that contain nicotine or tobacco, such as cigarettes and e-cigarettes. If you need help quitting, ask your health care provider.  Keep all follow-up visits as told by your health care provider. This is important, especially if you had a biopsy taken. Get help right away if:  You have shortness of breath that gets worse.  You become light-headed or feel like you might faint.  You have chest pain.  You cough up more than a small amount of blood.  The amount of blood you cough up increases. Summary  Common symptoms in the 24-48 hours following a flexible bronchoscopy include cough, low-grade fever, sore throat or hoarse voice, and blood-streaked mucus from the lungs (if you had a biopsy).  Do not eat or drink anything (including water) for  2 hours after your procedure, or until your local anesthetic has worn off. You can return to your normal diet the day after the procedure.  Get help right away if you develop worsening shortness of breath, have chest pain, become light-headed, or cough up more than a small amount of blood. This information is not intended to replace advice given to you by your health care provider. Make sure you discuss any questions you have with your health care provider. Document Released: 08/11/2004 Document Revised: 02/10/2016 Document Reviewed: 02/10/2016 Elsevier Interactive Patient Education  2019 [employer].

## 2018-02-26 NOTE — Interval H&P Note (Signed)
History and Physical Interval Note:  02/26/2018 1:33 PM  Seth [name]  has presented today for surgery, with the diagnosis of LUNG MASS  The various methods of treatment have been discussed with the patient and family. After consideration of risks, benefits and other options for treatment, the patient has consented to  Procedure(s): VIDEO BRONCHOSCOPY WITH ENDOBRONCHIAL ULTRASOUND (N/A) VIDEO BRONCHOSCOPY WITH ENDOBRONCHIAL NAVIGATION (N/A) as a surgical intervention .  The patient's history has been reviewed, patient examined, no change in status, stable for surgery.  I have reviewed the patient's chart and labs.  Questions were answered to the patient's satisfaction.    Patient seen and examined in pre-op. No barriers to proceed. Consent obtained. All questions answered. Plavix held, today is day 7 of holding.   Garner Nash, DO [facility] 02/26/2018 1:33 PM  Personal pager: [phone] If unanswered, please page CCM On-call: [phone]

## 2018-02-26 NOTE — Anesthesia Procedure Notes (Signed)
Procedure Name: Intubation Date/Time: 02/26/2018 2:04 PM Performed by: Renato Shin, CRNA Pre-anesthesia Checklist: Patient identified, Emergency Drugs available, Suction available and Patient being monitored Patient Re-evaluated:Patient Re-evaluated prior to induction Oxygen Delivery Method: Circle system utilized Preoxygenation: Pre-oxygenation with 100% oxygen Induction Type: IV induction Ventilation: Mask ventilation without difficulty and Oral airway inserted - appropriate to patient size Laryngoscope Size: Miller and 3 Grade View: Grade I Tube type: Oral Tube size: 8.0 mm Number of attempts: 1 Airway Equipment and Method: Stylet Placement Confirmation: ETT inserted through vocal cords under direct vision,  positive ETCO2 and breath sounds checked- equal and bilateral Secured at: 22 cm Tube secured with: Tape Dental Injury: Teeth and Oropharynx as per pre-operative assessment

## 2018-02-26 NOTE — Anesthesia Postprocedure Evaluation (Signed)
Anesthesia Post Note  Patient: Jeneen Rinks Santillo  Procedure(s) Performed: VIDEO BRONCHOSCOPY WITH ENDOBRONCHIAL ULTRASOUND (N/A Chest) VIDEO BRONCHOSCOPY WITH ENDOBRONCHIAL NAVIGATION (N/A Chest)     Patient location during evaluation: PACU Anesthesia Type: General Level of consciousness: awake and alert Pain management: pain level controlled Vital Signs Assessment: post-procedure vital signs reviewed and stable Respiratory status: spontaneous breathing, nonlabored ventilation, respiratory function stable and patient connected to nasal cannula oxygen Cardiovascular status: blood pressure returned to baseline and stable Postop Assessment: no apparent nausea or vomiting Anesthetic complications: no    Last Vitals:  Vitals:   02/26/18 1702 02/26/18 1717  BP: (!) 116/58 111/62  Pulse: 80 80  Resp: 15 17  Temp: (!) 36.3 C   SpO2: 93% 95%    Last Pain:  Vitals:   02/26/18 1717  TempSrc:   PainSc: 0-No pain                 Effie Berkshire

## 2018-02-27 ENCOUNTER — Encounter (HOSPITAL_COMMUNITY): Payer: Self-pay | Admitting: Pulmonary Disease

## 2018-02-28 ENCOUNTER — Telehealth: Payer: Self-pay | Admitting: Family Medicine

## 2018-02-28 DIAGNOSIS — I2510 Atherosclerotic heart disease of native coronary artery without angina pectoris: Secondary | ICD-10-CM

## 2018-02-28 DIAGNOSIS — I739 Peripheral vascular disease, unspecified: Secondary | ICD-10-CM

## 2018-02-28 DIAGNOSIS — I10 Essential (primary) hypertension: Secondary | ICD-10-CM

## 2018-02-28 NOTE — Telephone Encounter (Signed)
Copied from [place] [id number]. Topic: Referral - Request for Referral >> Feb 28, 2018 10:53 AM Alanda Slim E wrote: Has patient seen PCP for this complaint? Yes  *If NO, is insurance requiring patient see PCP for this issue before PCP can refer them? Referral for which specialty: Cardiology  Preferred provider/office: Dr. Elta Guadeloupe skains Reason for referral: Pt had a bronchoscopy and was chewed out for not seeing a cardiologist in over 2 years , they were worried about sedating him.

## 2018-03-03 ENCOUNTER — Encounter (HOSPITAL_COMMUNITY)
Admission: RE | Admit: 2018-03-03 | Discharge: 2018-03-03 | Disposition: A | Discharge status: Home / Self Care | Payer: Medicare Other | Source: Ambulatory Visit | Attending: Pulmonary Disease | Admitting: Pulmonary Disease

## 2018-03-03 DIAGNOSIS — R911 Solitary pulmonary nodule: Secondary | ICD-10-CM | POA: Diagnosis not present

## 2018-03-03 DIAGNOSIS — R918 Other nonspecific abnormal finding of lung field: Secondary | ICD-10-CM | POA: Insufficient documentation

## 2018-03-03 LAB — GLUCOSE, CAPILLARY: Glucose-Capillary: 93 mg/dL (ref 70–99)

## 2018-03-03 MED ORDER — FLUDEOXYGLUCOSE F - 18 (FDG) INJECTION
11.3000 | Freq: Once | INTRAVENOUS | Status: AC
  Administered 2018-03-03: 11.3 via INTRAVENOUS

## 2018-03-03 NOTE — Telephone Encounter (Signed)
He has been followed by vascular surgery, but will be happy to refer him to cardiology. He was seen by Dr. Marlou Porch in the past - referral placed.

## 2018-03-03 NOTE — Telephone Encounter (Signed)
Please advise 

## 2018-03-06 ENCOUNTER — Telehealth: Payer: Self-pay | Admitting: Pulmonary Disease

## 2018-03-06 NOTE — Telephone Encounter (Signed)
Called and spoke with Patients Wife, Alice.  She is requesting information or  possible updates on Dr Juline Patch plans for the Patient. She works for [employer] and due to the recent sale out to another company vacation time has been frozen.  She is applying for FMLA, so she can be off when needed with her Husband, but she is unsure when, and how long she may need to request. She is aware that Dr Valeta Harms is not in the office this week, but will return Monday.  Will route to [place], to pass to Dr Valeta Harms

## 2018-03-10 NOTE — Telephone Encounter (Signed)
PCCM:  [phone] ext [phone] (Wifes work number)  [phone] (Wifes cell phone)   Called and spoke with the patient and his wife via phone.  I will reach out to discuss the final pathology results again with Dr. Lyndon Code.  I spoke with him last week.  I need to decide whether or not we will need to proceed with additional biopsies to confirm disease.  We reviewed PET scan imaging via phone.  This is concerning for metastatic disease to the hilum.  [place], LPN  Garner Nash, [place] 03/10/2018 6:06 PM

## 2018-03-10 NOTE — Telephone Encounter (Signed)
Dr. Valeta Harms is back in office. Will route message to him for F/U.   BI please advise. Thanks.

## 2018-03-12 ENCOUNTER — Telehealth: Payer: Self-pay

## 2018-03-12 DIAGNOSIS — R590 Localized enlarged lymph nodes: Secondary | ICD-10-CM

## 2018-03-12 NOTE — Telephone Encounter (Signed)
[name] is calling to see if patient needs another bronoscopy.  Does he need to stop taking the Plavix.  Alice phone number is [phone].

## 2018-03-12 NOTE — Telephone Encounter (Signed)
Call made to patient wife Seth [name], I informed her she was correct. He will be possibly having another bronch and he will need to stop his plavix but we will call her with that finalized information. Voiced understanding. Nothing further needed at this time.

## 2018-03-12 NOTE — Telephone Encounter (Signed)
Call made to patient wife Seth [name], made aware patient needs to stop his plavix tomorrow and not to resume until after 2nd bronch. Voiced understanding. Order placed for bronch. Nothing further is needed at this time.

## 2018-03-12 NOTE — Telephone Encounter (Signed)
Left message with pathology to have Dr. Lyndon Code give BI a call.

## 2018-03-12 NOTE — Telephone Encounter (Signed)
Left message with [facility]. We will need his Chest C converted into a Super D. Will call back tomorrow.

## 2018-03-13 ENCOUNTER — Telehealth: Payer: Self-pay

## 2018-03-13 NOTE — Telephone Encounter (Signed)
Call made to patient and wife, informed his Bronch procedure is scheduled for 03/19/18 at 315pm. Arrive by 1:30pm. NPO after midnight. Patient and wife was reminded to stop his plavix until after bronch. Voiced understanding. Nothing further needed at this time.

## 2018-03-13 NOTE — Telephone Encounter (Signed)
Left message with [name] regarding converting his Chest CT in Super D< disc will be needed for bronch.

## 2018-03-15 ENCOUNTER — Other Ambulatory Visit: Payer: Self-pay | Admitting: Family Medicine

## 2018-03-15 DIAGNOSIS — I10 Essential (primary) hypertension: Secondary | ICD-10-CM

## 2018-03-17 ENCOUNTER — Other Ambulatory Visit: Payer: Self-pay

## 2018-03-17 ENCOUNTER — Telehealth: Payer: Self-pay | Admitting: Pulmonary Disease

## 2018-03-17 ENCOUNTER — Encounter (HOSPITAL_COMMUNITY): Payer: Self-pay | Admitting: *Deleted

## 2018-03-17 NOTE — Telephone Encounter (Signed)
Spoke with pt. He is needing to know if he needs to have his lab work done before his bronch on Wednesday. With his last bronch, he was contacted by [facility] and he had a preadmit appointment. I called short stay at [phone] and left a message with the schedulers to contact the pt and get this arranged. Pt is aware that short stay should contact him about this matter. He verbalized understanding. Nothing further was needed.

## 2018-03-17 NOTE — Progress Notes (Signed)
Pt stated that he has an appointment to re-establishing care with Dr. Marlou Porch, Cardiology. Pt stated that he is under the care of Dr. Carlota Raspberry, PCP.  Pt stated hat he is not sure if an echo was ever performed. Pt denies recent labs. Pt stated that his last dose of Plavix was Wednesday afternoon as instructed. Pt made aware to stop taking vitamins, fish oil and herbal medications. Do not take any NSAIDs ie: Ibuprofen, Advil, Naproxen (Aleve), Motrin, BC and Goody Powder. Pt verbalized understanding of all pre-op instructions.

## 2018-03-17 NOTE — Telephone Encounter (Signed)
LMTCB. Patient is to eat nothing after midnight. He can take his BP medication with a sip of water the morning of the procedure. He will go home the same day unless they are complications. Typically patient's do not need anyone to supervise or sit with them the following day however that is up to the patient and what they are comfortable with.

## 2018-03-18 NOTE — Telephone Encounter (Signed)
Called and spoke with patients wife, she is aware of response and verbalized understanding. Nothing further needed.

## 2018-03-18 NOTE — Telephone Encounter (Signed)
Called and left message with [facility] medical records. We need this patients chest ct converted to a super d disc for a bronch.

## 2018-03-18 NOTE — Telephone Encounter (Signed)
This has been addressed. Disc will be ready in the AM for pick up. Nothing further is needed at this time.

## 2018-03-19 ENCOUNTER — Ambulatory Visit (HOSPITAL_COMMUNITY): Payer: Medicare Other

## 2018-03-19 ENCOUNTER — Ambulatory Visit (HOSPITAL_COMMUNITY): Payer: Medicare Other | Admitting: Anesthesiology

## 2018-03-19 ENCOUNTER — Encounter (HOSPITAL_COMMUNITY): Payer: Self-pay

## 2018-03-19 ENCOUNTER — Other Ambulatory Visit: Payer: Self-pay

## 2018-03-19 ENCOUNTER — Ambulatory Visit (HOSPITAL_COMMUNITY)
Admission: RE | Admit: 2018-03-19 | Discharge: 2018-03-19 | Disposition: A | Discharge status: Home / Self Care | Payer: Medicare Other | Attending: Pulmonary Disease | Admitting: Pulmonary Disease

## 2018-03-19 ENCOUNTER — Encounter (HOSPITAL_COMMUNITY)
Admission: RE | Disposition: A | Discharge status: Home / Self Care | Payer: Self-pay | Source: Home / Self Care | Attending: Pulmonary Disease

## 2018-03-19 DIAGNOSIS — R599 Enlarged lymph nodes, unspecified: Secondary | ICD-10-CM | POA: Diagnosis not present

## 2018-03-19 DIAGNOSIS — Z419 Encounter for procedure for purposes other than remedying health state, unspecified: Secondary | ICD-10-CM

## 2018-03-19 DIAGNOSIS — I739 Peripheral vascular disease, unspecified: Secondary | ICD-10-CM | POA: Diagnosis not present

## 2018-03-19 DIAGNOSIS — Z91041 Radiographic dye allergy status: Secondary | ICD-10-CM | POA: Diagnosis not present

## 2018-03-19 DIAGNOSIS — J479 Bronchiectasis, uncomplicated: Secondary | ICD-10-CM | POA: Diagnosis not present

## 2018-03-19 DIAGNOSIS — J439 Emphysema, unspecified: Secondary | ICD-10-CM | POA: Insufficient documentation

## 2018-03-19 DIAGNOSIS — E785 Hyperlipidemia, unspecified: Secondary | ICD-10-CM | POA: Diagnosis not present

## 2018-03-19 DIAGNOSIS — I10 Essential (primary) hypertension: Secondary | ICD-10-CM | POA: Insufficient documentation

## 2018-03-19 DIAGNOSIS — Z7982 Long term (current) use of aspirin: Secondary | ICD-10-CM | POA: Insufficient documentation

## 2018-03-19 DIAGNOSIS — R911 Solitary pulmonary nodule: Secondary | ICD-10-CM | POA: Diagnosis not present

## 2018-03-19 DIAGNOSIS — Z7902 Long term (current) use of antithrombotics/antiplatelets: Secondary | ICD-10-CM | POA: Diagnosis not present

## 2018-03-19 DIAGNOSIS — K219 Gastro-esophageal reflux disease without esophagitis: Secondary | ICD-10-CM | POA: Insufficient documentation

## 2018-03-19 DIAGNOSIS — Z79899 Other long term (current) drug therapy: Secondary | ICD-10-CM | POA: Insufficient documentation

## 2018-03-19 DIAGNOSIS — Z9889 Other specified postprocedural states: Secondary | ICD-10-CM

## 2018-03-19 DIAGNOSIS — C801 Malignant (primary) neoplasm, unspecified: Secondary | ICD-10-CM | POA: Diagnosis not present

## 2018-03-19 DIAGNOSIS — Z87891 Personal history of nicotine dependence: Secondary | ICD-10-CM | POA: Insufficient documentation

## 2018-03-19 DIAGNOSIS — R942 Abnormal results of pulmonary function studies: Secondary | ICD-10-CM | POA: Diagnosis not present

## 2018-03-19 DIAGNOSIS — I2510 Atherosclerotic heart disease of native coronary artery without angina pectoris: Secondary | ICD-10-CM | POA: Insufficient documentation

## 2018-03-19 DIAGNOSIS — R590 Localized enlarged lymph nodes: Secondary | ICD-10-CM | POA: Diagnosis not present

## 2018-03-19 DIAGNOSIS — N400 Enlarged prostate without lower urinary tract symptoms: Secondary | ICD-10-CM | POA: Diagnosis not present

## 2018-03-19 DIAGNOSIS — Z888 Allergy status to other drugs, medicaments and biological substances status: Secondary | ICD-10-CM | POA: Insufficient documentation

## 2018-03-19 DIAGNOSIS — C771 Secondary and unspecified malignant neoplasm of intrathoracic lymph nodes: Secondary | ICD-10-CM | POA: Diagnosis not present

## 2018-03-19 DIAGNOSIS — R918 Other nonspecific abnormal finding of lung field: Secondary | ICD-10-CM | POA: Diagnosis not present

## 2018-03-19 DIAGNOSIS — Z881 Allergy status to other antibiotic agents status: Secondary | ICD-10-CM | POA: Diagnosis not present

## 2018-03-19 DIAGNOSIS — I700 Atherosclerosis of aorta: Secondary | ICD-10-CM | POA: Diagnosis not present

## 2018-03-19 DIAGNOSIS — C3412 Malignant neoplasm of upper lobe, left bronchus or lung: Secondary | ICD-10-CM

## 2018-03-19 HISTORY — DX: Solitary pulmonary nodule: R91.1

## 2018-03-19 HISTORY — DX: Localized enlarged lymph nodes: R59.0

## 2018-03-19 HISTORY — DX: Emphysema, unspecified: J43.9

## 2018-03-19 HISTORY — DX: Cardiac murmur, unspecified: R01.1

## 2018-03-19 HISTORY — DX: Presence of dental prosthetic device (complete) (partial): Z97.2

## 2018-03-19 HISTORY — DX: Unspecified cataract: H26.9

## 2018-03-19 HISTORY — DX: Gastro-esophageal reflux disease without esophagitis: K21.9

## 2018-03-19 HISTORY — DX: Headache, unspecified: R51.9

## 2018-03-19 HISTORY — PX: VIDEO BRONCHOSCOPY WITH ENDOBRONCHIAL ULTRASOUND: SHX6177

## 2018-03-19 HISTORY — DX: Personal history of other diseases of the digestive system: Z87.19

## 2018-03-19 HISTORY — DX: Headache: R51

## 2018-03-19 HISTORY — PX: VIDEO BRONCHOSCOPY WITH ENDOBRONCHIAL NAVIGATION: SHX6175

## 2018-03-19 LAB — COMPREHENSIVE METABOLIC PANEL
ALT: 15 U/L (ref 0–44)
AST: 17 U/L (ref 15–41)
Albumin: 3.8 g/dL (ref 3.5–5.0)
Alkaline Phosphatase: 80 U/L (ref 38–126)
Anion gap: 14 (ref 5–15)
BUN: 10 mg/dL (ref 8–23)
CO2: 19 mmol/L — ABNORMAL LOW (ref 22–32)
Calcium: 9.1 mg/dL (ref 8.9–10.3)
Chloride: 105 mmol/L (ref 98–111)
Creatinine, Ser: 1.32 mg/dL — ABNORMAL HIGH (ref 0.61–1.24)
GFR calc non Af Amer: 54 mL/min — ABNORMAL LOW (ref >60)
Glucose, Bld: 113 mg/dL — ABNORMAL HIGH (ref 70–99)
Potassium: 4.5 mmol/L (ref 3.5–5.1)
Sodium: 138 mmol/L (ref 135–145)
Total Bilirubin: 0.8 mg/dL (ref 0.3–1.2)
Total Protein: 6.5 g/dL (ref 6.5–8.1)

## 2018-03-19 LAB — CBC
HCT: 47.9 % (ref 39.0–52.0)
Hemoglobin: 15.2 g/dL (ref 13.0–17.0)
MCH: 29.3 pg (ref 26.0–34.0)
MCHC: 31.7 g/dL (ref 30.0–36.0)
MCV: 92.3 fL (ref 80.0–100.0)
NRBC: 0 % (ref 0.0–0.2)
Platelets: 215 10*3/uL (ref 150–400)
RBC: 5.19 MIL/uL (ref 4.22–5.81)
RDW: 13.1 % (ref 11.5–15.5)
WBC: 7 10*3/uL (ref 4.0–10.5)

## 2018-03-19 LAB — APTT: APTT: 29 s (ref 24–36)

## 2018-03-19 LAB — PROTIME-INR
INR: 1
Prothrombin Time: 13.1 seconds (ref 11.4–15.2)

## 2018-03-19 SURGERY — VIDEO BRONCHOSCOPY WITH ENDOBRONCHIAL NAVIGATION
Anesthesia: General | Site: Chest

## 2018-03-19 MED ORDER — DEXAMETHASONE SODIUM PHOSPHATE 10 MG/ML IJ SOLN
INTRAMUSCULAR | Status: AC
  Filled 2018-03-19: qty 1

## 2018-03-19 MED ORDER — FENTANYL CITRATE (PF) 250 MCG/5ML IJ SOLN
INTRAMUSCULAR | Status: AC
  Filled 2018-03-19: qty 5

## 2018-03-19 MED ORDER — LACTATED RINGERS IV SOLN
INTRAVENOUS | Status: DC
  Administered 2018-03-19: 11:00:00 via INTRAVENOUS

## 2018-03-19 MED ORDER — PROPOFOL 10 MG/ML IV BOLUS
INTRAVENOUS | Status: AC
  Filled 2018-03-19: qty 20

## 2018-03-19 MED ORDER — FENTANYL CITRATE (PF) 100 MCG/2ML IJ SOLN
INTRAMUSCULAR | Status: DC | PRN
  Administered 2018-03-19: 100 ug via INTRAVENOUS
  Administered 2018-03-19: 25 ug via INTRAVENOUS

## 2018-03-19 MED ORDER — 0.9 % SODIUM CHLORIDE (POUR BTL) OPTIME
TOPICAL | Status: DC | PRN
  Administered 2018-03-19: 1000 mL

## 2018-03-19 MED ORDER — LIDOCAINE 2% (20 MG/ML) 5 ML SYRINGE
INTRAMUSCULAR | Status: AC
  Filled 2018-03-19: qty 5

## 2018-03-19 MED ORDER — DEXAMETHASONE SODIUM PHOSPHATE 10 MG/ML IJ SOLN
INTRAMUSCULAR | Status: DC | PRN
  Administered 2018-03-19: 4 mg via INTRAVENOUS

## 2018-03-19 MED ORDER — HYDROMORPHONE HCL 1 MG/ML IJ SOLN: 0.2500 mg | INTRAMUSCULAR | Status: DC | PRN

## 2018-03-19 MED ORDER — ONDANSETRON HCL 4 MG/2ML IJ SOLN
INTRAMUSCULAR | Status: DC | PRN
  Administered 2018-03-19: 4 mg via INTRAVENOUS

## 2018-03-19 MED ORDER — LIDOCAINE 2% (20 MG/ML) 5 ML SYRINGE
INTRAMUSCULAR | Status: DC | PRN
  Administered 2018-03-19: 100 mg via INTRAVENOUS

## 2018-03-19 MED ORDER — SUGAMMADEX SODIUM 200 MG/2ML IV SOLN
INTRAVENOUS | Status: DC | PRN
  Administered 2018-03-19: 200 mg via INTRAVENOUS

## 2018-03-19 MED ORDER — ROCURONIUM BROMIDE 50 MG/5ML IV SOSY
PREFILLED_SYRINGE | INTRAVENOUS | Status: AC
  Filled 2018-03-19: qty 5

## 2018-03-19 MED ORDER — PROMETHAZINE HCL 25 MG/ML IJ SOLN: 6.2500 mg | INTRAMUSCULAR | Status: DC | PRN

## 2018-03-19 MED ORDER — SODIUM CHLORIDE 0.9 % IV SOLN
INTRAVENOUS | Status: DC | PRN
  Administered 2018-03-19: 80 ug/min via INTRAVENOUS

## 2018-03-19 MED ORDER — OXYCODONE HCL 5 MG/5ML PO SOLN: 5.0000 mg | Freq: Once | ORAL | Status: DC | PRN

## 2018-03-19 MED ORDER — PROPOFOL 10 MG/ML IV BOLUS
INTRAVENOUS | Status: DC | PRN
  Administered 2018-03-19: 130 mg via INTRAVENOUS

## 2018-03-19 MED ORDER — ONDANSETRON HCL 4 MG/2ML IJ SOLN
INTRAMUSCULAR | Status: AC
  Filled 2018-03-19: qty 2

## 2018-03-19 MED ORDER — ROCURONIUM BROMIDE 10 MG/ML (PF) SYRINGE
PREFILLED_SYRINGE | INTRAVENOUS | Status: DC | PRN
  Administered 2018-03-19: 20 mg via INTRAVENOUS
  Administered 2018-03-19: 50 mg via INTRAVENOUS
  Administered 2018-03-19 (×2): 10 mg via INTRAVENOUS

## 2018-03-19 MED ORDER — SUCCINYLCHOLINE CHLORIDE 20 MG/ML IJ SOLN
INTRAMUSCULAR | Status: DC | PRN
  Administered 2018-03-19: 120 mg via INTRAVENOUS

## 2018-03-19 MED ORDER — OXYCODONE HCL 5 MG PO TABS: 5.0000 mg | ORAL_TABLET | Freq: Once | ORAL | Status: DC | PRN

## 2018-03-19 MED ORDER — ROCURONIUM BROMIDE 50 MG/5ML IV SOSY
PREFILLED_SYRINGE | INTRAVENOUS | Status: AC
  Filled 2018-03-19: qty 10

## 2018-03-19 MED ORDER — PHENYLEPHRINE 40 MCG/ML (10ML) SYRINGE FOR IV PUSH (FOR BLOOD PRESSURE SUPPORT)
PREFILLED_SYRINGE | INTRAVENOUS | Status: DC | PRN
  Administered 2018-03-19 (×2): 80 ug via INTRAVENOUS
  Administered 2018-03-19: 40 ug via INTRAVENOUS
  Administered 2018-03-19 (×2): 80 ug via INTRAVENOUS

## 2018-03-19 MED ORDER — ESMOLOL HCL 100 MG/10ML IV SOLN
INTRAVENOUS | Status: DC | PRN
  Administered 2018-03-19: 20 mg via INTRAVENOUS

## 2018-03-19 SURGICAL SUPPLY — 54 items
ADAPTER BRONCHOSCOPE OLYMPUS (ADAPTER) ×3 IMPLANT
ADAPTER VALVE BIOPSY EBUS (MISCELLANEOUS) IMPLANT
ADPTR VALVE BIOPSY EBUS (MISCELLANEOUS)
BALLN FOR EBUS SCOPE (BALLOONS) ×6
BALLOON FOR EBUS SCOPE (BALLOONS) ×2 IMPLANT
BRUSH BIOPSY BRONCH 10 SDTNB (MISCELLANEOUS) ×2 IMPLANT
BRUSH BIOPSY BRONCH 10MM SDTNB (MISCELLANEOUS) ×1
BRUSH CYTOL CELLEBRITY 1.5X140 (MISCELLANEOUS) ×3 IMPLANT
BRUSH SUPERTRAX BIOPSY (INSTRUMENTS) IMPLANT
BRUSH SUPERTRAX NDL-TIP CYTO (INSTRUMENTS) ×6 IMPLANT
CANISTER SUCT 3000ML PPV (MISCELLANEOUS) ×3 IMPLANT
CHANNEL WORK EXTEND EDGE 180 (KITS) ×3 IMPLANT
CHANNEL WORK EXTEND EDGE 90 (KITS) IMPLANT
CONT SPEC 4OZ CLIKSEAL STRL BL (MISCELLANEOUS) ×6 IMPLANT
COVER BACK TABLE 60X90IN (DRAPES) ×3 IMPLANT
COVER WAND RF STERILE (DRAPES) ×3 IMPLANT
FILTER STRAW FLUID ASPIR (MISCELLANEOUS) IMPLANT
FORCEPS BIOP RJ4 1.8 (CUTTING FORCEPS) IMPLANT
FORCEPS BIOP SUPERTRX PREMAR (INSTRUMENTS) ×3 IMPLANT
GAUZE SPONGE 4X4 12PLY STRL (GAUZE/BANDAGES/DRESSINGS) ×3 IMPLANT
GLOVE SURG SS PI 7.5 STRL IVOR (GLOVE) ×6 IMPLANT
GOWN STRL REUS W/ TWL LRG LVL3 (GOWN DISPOSABLE) ×2 IMPLANT
GOWN STRL REUS W/TWL LRG LVL3 (GOWN DISPOSABLE) ×4
KIT CLEAN ENDO COMPLIANCE (KITS) ×6 IMPLANT
KIT LOCATABLE GUIDE (CANNULA) IMPLANT
KIT MARKER FIDUCIAL DELIVERY (KITS) IMPLANT
KIT PROCEDURE EDGE 180 (KITS) ×3 IMPLANT
KIT PROCEDURE EDGE 90 (KITS) IMPLANT
KIT TURNOVER KIT B (KITS) ×3 IMPLANT
MARKER SKIN DUAL TIP RULER LAB (MISCELLANEOUS) ×3 IMPLANT
NEEDLE ASPIRATION VIZISHOT 19G (NEEDLE) ×3 IMPLANT
NEEDLE ASPIRATION VIZISHOT 21G (NEEDLE) IMPLANT
NEEDLE SUPERTRX PREMARK BIOPSY (NEEDLE) ×6 IMPLANT
NS IRRIG 1000ML POUR BTL (IV SOLUTION) ×3 IMPLANT
OIL SILICONE PENTAX (PARTS (SERVICE/REPAIRS)) ×3 IMPLANT
PAD ARMBOARD 7.5X6 YLW CONV (MISCELLANEOUS) ×6 IMPLANT
PATCHES PATIENT (LABEL) ×9 IMPLANT
STOPCOCK 4 WAY LG BORE MALE ST (IV SETS) ×6 IMPLANT
SYR 10ML LL (SYRINGE) ×3 IMPLANT
SYR 20CC LL (SYRINGE) ×3 IMPLANT
SYR 20ML ECCENTRIC (SYRINGE) ×6 IMPLANT
SYR 3ML LL SCALE MARK (SYRINGE) IMPLANT
SYR 50ML SLIP (SYRINGE) ×3 IMPLANT
SYR 5ML LL (SYRINGE) IMPLANT
TOWEL OR 17X24 6PK STRL BLUE (TOWEL DISPOSABLE) ×3 IMPLANT
TRAP SPECIMEN MUCOUS 40CC (MISCELLANEOUS) IMPLANT
TUBE CONNECTING 20'X1/4 (TUBING) ×1
TUBE CONNECTING 20X1/4 (TUBING) ×2 IMPLANT
TUBING CIL FLEX 10 FLL-RA (TUBING) ×3 IMPLANT
UNDERPAD 30X30 (UNDERPADS AND DIAPERS) ×3 IMPLANT
VALVE BIOPSY  SINGLE USE (MISCELLANEOUS) ×6
VALVE BIOPSY SINGLE USE (MISCELLANEOUS) ×3 IMPLANT
VALVE SUCTION BRONCHIO DISP (MISCELLANEOUS) ×3 IMPLANT
WATER STERILE IRR 1000ML POUR (IV SOLUTION) ×3 IMPLANT

## 2018-03-19 NOTE — Interval H&P Note (Signed)
History and Physical Interval Note:  03/19/2018 12:32 PM  Seth [name]  has presented today for surgery, with the diagnosis of lung mass  The various methods of treatment have been discussed with the patient and family. After consideration of risks, benefits and other options for treatment, the patient has consented to  Procedure(s): [place] (N/A) NAVIGATION BRONCHOSCOPY (N/A) as a surgical intervention .  The patient's history has been reviewed, patient examined, no change in status, stable for surgery.  I have reviewed the patient's chart and labs.  Questions were answered to the patient's satisfaction.    Patient seen in pre-op. All questions answered. Patient stopped plavix last Wednesday. No barriers to proceed.  Garner Nash, DO [facility] 03/19/2018 12:32 PM  Personal pager: [phone] If unanswered, please page CCM On-call: [phone]

## 2018-03-19 NOTE — Discharge Instructions (Signed)
Flexible Bronchoscopy, Care After This sheet gives you information about how to care for yourself after your procedure. Your health care provider may also give you more specific instructions. If you have problems or questions, contact your health care provider. What can I expect after the procedure? After the procedure, it is common to have the following symptoms for 24-48 hours:  A cough that is worse than it was before the procedure.  A low-grade fever.  A sore throat or hoarse voice.  Small streaks of blood in the mucus from your lungs (sputum), if tissue samples were removed (biopsy). Follow these instructions at home: Eating and drinking  The day after the procedure, return to your normal diet. Driving  Do not drive for 24 hours if you were given a medicine to help you relax (sedative).  Do not drive or use heavy machinery while taking prescription pain medicine. General instructions   Take over-the-counter and prescription medicines only as told by your health care provider.  Return to your normal activities as told by your health care provider. Ask your health care provider what activities are safe for you.  Do not use any products that contain nicotine or tobacco, such as cigarettes and e-cigarettes. If you need help quitting, ask your health care provider.  Keep all follow-up visits as told by your health care provider. This is important, especially if you had a biopsy taken. Get help right away if:  You have shortness of breath that gets worse.  You become light-headed or feel like you might faint.  You have chest pain.  You cough up more than a small amount of blood.  The amount of blood you cough up increases. Summary  Common symptoms in the 24-48 hours following a flexible bronchoscopy include cough, low-grade fever, sore throat or hoarse voice, and blood-streaked mucus from the lungs (if you had a biopsy).  Do not eat or drink anything (including water) for  2 hours after your procedure, or until your local anesthetic has worn off. You can return to your normal diet the day after the procedure.  Get help right away if you develop worsening shortness of breath, have chest pain, become light-headed, or cough up more than a small amount of blood. This information is not intended to replace advice given to you by your health care provider. Make sure you discuss any questions you have with your health care provider. Document Released: 08/11/2004 Document Revised: 02/10/2016 Document Reviewed: 02/10/2016 Elsevier Interactive Patient Education  2019 [employer].

## 2018-03-19 NOTE — Anesthesia Postprocedure Evaluation (Signed)
Anesthesia Post Note  Patient: Seth [name]  Procedure(s) Performed: VIDEO BRONCHOSCOPY WITH ENDOBRONCHIAL NAVIGATION and endobronchial ultrasound (N/A Chest) NAVIGATION BRONCHOSCOPY (N/A Chest) Video Bronchoscopy With Radial Endobronchial Ultrasound (N/A Chest)     Patient location during evaluation: PACU Anesthesia Type: General Level of consciousness: awake and alert Pain management: pain level controlled Vital Signs Assessment: post-procedure vital signs reviewed and stable Respiratory status: spontaneous breathing, nonlabored ventilation, respiratory function stable and patient connected to nasal cannula oxygen Cardiovascular status: blood pressure returned to baseline and stable Postop Assessment: no apparent nausea or vomiting Anesthetic complications: no    Last Vitals:  Vitals:   03/19/18 1605 03/19/18 1630  BP: (!) 142/75 133/75  Pulse: 79 77  Resp: 16 16  Temp: (!) 36.2 C   SpO2: 94% 94%    Last Pain:  Vitals:   03/19/18 1630  TempSrc:   PainSc: 0-No pain                 Tiajuana Amass

## 2018-03-19 NOTE — Anesthesia Preprocedure Evaluation (Signed)
Anesthesia Evaluation  Patient identified by MRN, date of birth, ID band Patient awake    Reviewed: Allergy & Precautions, NPO status , Patient's Chart, lab work & pertinent test results  Airway Mallampati: I  TM Distance: >3 FB Neck ROM: Full    Dental  (+) Edentulous Upper, Edentulous Lower   Pulmonary former smoker,    breath sounds clear to auscultation       Cardiovascular hypertension, Pt. on medications and Pt. on home beta blockers + CAD and + Peripheral Vascular Disease   Rhythm:Regular Rate:Normal     Neuro/Psych Anxiety    GI/Hepatic Neg liver ROS, GERD  Medicated,  Endo/Other  negative endocrine ROS  Renal/GU negative Renal ROS     Musculoskeletal negative musculoskeletal ROS (+)   Abdominal Normal abdominal exam  (+)   Peds  Hematology negative hematology ROS (+)   Anesthesia Other Findings - HLD  Reproductive/Obstetrics                             Anesthesia Physical  Anesthesia Plan  ASA: III  Anesthesia Plan: General   Post-op Pain Management:    Induction: Intravenous  PONV Risk Score and Plan: 2 and Ondansetron, Treatment may vary due to age or medical condition and Midazolam  Airway Management Planned: Oral ETT  Additional Equipment: None  Intra-op Plan:   Post-operative Plan: Extubation in OR  Informed Consent:   Plan Discussed with: CRNA  Anesthesia Plan Comments: (See PAT note written 02/25/2018 by Myra Gianotti, PA-C. Mild-moderate CAD 04/2012. Saw Dr. Marlou Porch 10/2013. PAD followed by vascular surgery in [place].   Explained cardiac risk in detail with patient and family. )        Anesthesia Quick Evaluation

## 2018-03-19 NOTE — Progress Notes (Signed)
IV left wrist infiltrated.  Stopped fluids and removed.

## 2018-03-19 NOTE — Transfer of Care (Signed)
Immediate Anesthesia Transfer of Care Note  Patient: Seth [name]  Procedure(s) Performed: VIDEO BRONCHOSCOPY WITH ENDOBRONCHIAL NAVIGATION and endobronchial ultrasound (N/A Chest) NAVIGATION BRONCHOSCOPY (N/A Chest) Video Bronchoscopy With Radial Endobronchial Ultrasound (N/A Chest)  Patient Location: PACU  Anesthesia Type:General  Level of Consciousness: awake and confused  Airway & Oxygen Therapy: Patient Spontanous Breathing and Patient connected to nasal cannula oxygen  Post-op Assessment: Report given to RN and Post -op Vital signs reviewed and stable  Post vital signs: Reviewed and stable  Last Vitals:  Vitals Value Taken Time  BP 128/102 03/19/2018  3:20 PM  Temp    Pulse 77 03/19/2018  3:21 PM  Resp 18 03/19/2018  3:21 PM  SpO2 91 % 03/19/2018  3:21 PM  Vitals shown include unvalidated device data.  Last Pain:  Vitals:   03/19/18 1053  TempSrc:   PainSc: 0-No pain         Complications: No apparent anesthesia complications

## 2018-03-19 NOTE — Progress Notes (Signed)
Chest xray results per Radiologist; stable, no acute changes. Ok to discharge patient per Dr Valeta Harms if chest xray ok.

## 2018-03-19 NOTE — Anesthesia Procedure Notes (Addendum)
Procedure Name: Intubation Performed by: Milford Cage, CRNA Pre-anesthesia Checklist: Patient identified, Emergency Drugs available, Suction available and Patient being monitored Patient Re-evaluated:Patient Re-evaluated prior to induction Oxygen Delivery Method: Circle System Utilized Preoxygenation: Pre-oxygenation with 100% oxygen Induction Type: IV induction Laryngoscope Size: Mac and 4 Grade View: Grade II Tube type: Oral Tube size: 8.5 mm Number of attempts: 1 Airway Equipment and Method: Stylet,  Oral airway and LTA kit utilized Placement Confirmation: ETT inserted through vocal cords under direct vision,  positive ETCO2 and breath sounds checked- equal and bilateral Secured at: 24 cm Tube secured with: Tape Dental Injury: Teeth and Oropharynx as per pre-operative assessment

## 2018-03-19 NOTE — Op Note (Addendum)
Video Bronchoscopy with Endobronchial Ultrasound Procedure, Video Bronchoscopy with  Electromagnetic Navigation Procedure and Video Bronchoscopy with Radial Endobronchial Ultrasound and Peripheral Targeting Note  Date of Operation: 03/19/2018  Pre-op Diagnosis: Left lingular lung nodule, 11 L hilar adenopathy  Post-op Diagnosis: Left lingular lung nodule, 11 L hilar adenopathy  Surgeon: Garner Nash, DO  Assistants: None   Anesthesia: General endotracheal anesthesia  Operation: Flexible video fiberoptic bronchoscopy, curvilinear endobronchial ultrasound, electromagnetic navigation and radial endobronchial ultrasound with biopsies.  Estimated Blood Loss: Minimal, <3TD  Complications: None   Indications and History: Seth [name] is a 73 y.o. male with left PET avid lingular lung nodule with PET avid 11 L hilar adenopathy concerning for a primary bronchogenic carcinoma.  The risks, benefits, complications, treatment options and expected outcomes were discussed with the patient.  The possibilities of pneumothorax, pneumonia, reaction to medication, pulmonary aspiration, perforation of a viscus, bleeding, failure to diagnose a condition and creating a complication requiring transfusion or operation were discussed with the patient who freely signed the consent.    Description of Procedure: The patient was seen in the Preoperative Area, was examined and was deemed appropriate to proceed.  The patient was taken to [facility] OR 10, identified as [name] and the procedure verified as Flexible Video Fiberoptic Bronchoscopy.  A Time Out was held and the above information confirmed.   After being taken to the operating room general anesthesia was initiated and the patient  was orally intubated. The video fiberoptic bronchoscope was introduced via the endotracheal tube and a general inspection was performed which showed distal airway bronchiectasis with pitting and striations consistent with longstanding  smoking history and thermal injury. The standard scope was then withdrawn and the endobronchial ultrasound was used to identify and characterize the peritracheal, hilar and bronchial lymph nodes. Inspection showed an enlarged 11 L hilar lymph node. Using real-time ultrasound guidance Wang needle biopsies were take from Station 11 L nodes and were sent for cytology. The patient tolerated the procedure well without apparent complications. There was no significant blood loss. The bronchoscope was withdrawn.  Prior to the date of the procedure a high-resolution CT scan of the chest was performed. Utilizing [place] a virtual tracheobronchial tree was generated to allow the creation of distinct navigation pathways to the patient's parenchymal abnormalities. The extendable working channel and locator guide were introduced into the bronchoscope. The distinct navigation pathways prepared prior to this procedure were then utilized to navigate to within 0.8 cm of patient's lesion(s) identified on CT scan.  Using local registration and C arm fluoroscopic sweep we attempted to obtain better location and navigation to the lesion.  Using the local registration it did position our lesion inferior to the initial navigational software location.  The extendable working channel was secured into place and the locator guide was withdrawn.  With the locatable guide with [place] we placed the Olympus radial endobronchial ultrasound probe through the working channel.  With this we were able to identify the lesion and approximate the location of the lesion under fluoroscopy as well as positioning of real-time visualization using the radial endobronchial ultrasound probe.  Under fluoroscopic guidance transbronchial needle brushings, transbronchial Wang needle biopsies, and transbronchial forceps biopsies were performed to be sent for cytology and pathology.  Most of the instruments found difficulty making what needed to be a  inferior nearly 90 degree turn into the lesion.  We attempted adjusting the catheter with retraction as well as rotation to allow a more inferior approach  however due to the medial proximity of the nodule it made it very difficult to have a straight forward biopsy approach.  We were unsuccessful at placing the aspiration needle in appropriate location this was only used once and then exchange instruments.  At the conclusion of the sampling, a bronchioalveolar lavage was performed in the lingula and sent for cytology. At the end of the procedure a general airway inspection was performed and there was no evidence of active bleeding. The bronchoscope was removed.  The patient tolerated the procedure well. There was no significant blood loss and there were no obvious complications. A post-procedural chest x-ray is pending.  Samples: 1.  Transbronchial needle aspiration biopsies of station 11 L, x12 2. Transbronchial needle brushings from left upper lobe lingular nodule x3 3. Transbronchial Wang needle biopsies from left upper lobe lingular nodule x1 4. Transbronchial forceps biopsies from lingular nodule x8 5.  Triple brush specimen x3 to left upper lobe lingular nodule 6. Bronchoalveolar lavage from lingula for cytology  Preliminary pathology: 11 L read as malignant in the room  Plans:  The patient will be discharged from the PACU to home when recovered from anesthesia and after chest x-ray is reviewed. We will review the cytology, pathology and microbiology results with the patient when they become available. Outpatient followup will be with Leory Plowman L Icard,DO.   Garner Nash, DO [facility] 03/19/2018 3:23 PM  Personal pager: [phone] If unanswered, please page CCM On-call: [phone]

## 2018-03-20 ENCOUNTER — Encounter (HOSPITAL_COMMUNITY): Payer: Self-pay | Admitting: Pulmonary Disease

## 2018-03-20 ENCOUNTER — Ambulatory Visit: Payer: Self-pay | Admitting: Pulmonary Disease

## 2018-03-21 ENCOUNTER — Telehealth: Payer: Self-pay | Admitting: *Deleted

## 2018-03-21 DIAGNOSIS — R918 Other nonspecific abnormal finding of lung field: Secondary | ICD-10-CM

## 2018-03-21 NOTE — Telephone Encounter (Signed)
Oncology Nurse Navigator Documentation  Oncology Nurse Navigator Flowsheets 03/21/2018  Navigator Location [facility]  Referral date to RadOnc/MedOnc 03/20/2018  Navigator Encounter Type Telephone/I received referral on Seth [name].  I called and scheduled him to be seen at [facility] on 03/27/2018.  He verbalized understanding of appt time and place.  Telephone Outgoing Call  Treatment Phase Pre-Tx/Tx Discussion  Barriers/Navigation Needs Education;Coordination of Care  Education Other  Interventions Coordination of Care;Education  Coordination of Care Appts  Education Method Verbal  Acuity Level 2  Time Spent with Patient 30

## 2018-03-25 NOTE — Progress Notes (Signed)
Dear all,   11L positive, final path read as carcinoma (discuss with pathology today and he was reviewed briefly at [place] last thrusday). All biopsies for a second time to the proximal nodule was negative. Its in a difficult location to access with nav or a bronchoscope.   I guess would consider [facility] referral to meet Dr. Earlie Server and Dr. Roxan Hockey same day. I suspect maybe able to tolerate surgery +/- thoughts on chemo. He will likely need cardiac clearance prior to a major surgery.  Thanks for all the help coordinating his care,   [place], DO [facility] 03/25/2018 5:54 PM

## 2018-03-27 ENCOUNTER — Other Ambulatory Visit: Payer: Self-pay | Admitting: *Deleted

## 2018-03-27 ENCOUNTER — Ambulatory Visit: Payer: Medicare Other | Attending: Radiation Oncology | Admitting: Radiation Oncology

## 2018-03-27 ENCOUNTER — Other Ambulatory Visit: Payer: Self-pay

## 2018-03-27 ENCOUNTER — Inpatient Hospital Stay: Payer: Medicare Other | Attending: Internal Medicine

## 2018-03-27 ENCOUNTER — Inpatient Hospital Stay (HOSPITAL_BASED_OUTPATIENT_CLINIC_OR_DEPARTMENT_OTHER): Payer: Medicare Other | Admitting: Internal Medicine

## 2018-03-27 ENCOUNTER — Institutional Professional Consult (permissible substitution) (INDEPENDENT_AMBULATORY_CARE_PROVIDER_SITE_OTHER): Payer: Medicare Other | Admitting: Cardiothoracic Surgery

## 2018-03-27 ENCOUNTER — Encounter: Payer: Self-pay | Admitting: Internal Medicine

## 2018-03-27 VITALS — BP 133/79 | HR 89 | Temp 97.6°F | Resp 19 | Ht 72.0 in | Wt 213.3 lb

## 2018-03-27 VITALS — BP 133/79 | HR 89 | Temp 97.6°F | Resp 19 | Ht 74.0 in | Wt 213.3 lb

## 2018-03-27 DIAGNOSIS — C3492 Malignant neoplasm of unspecified part of left bronchus or lung: Secondary | ICD-10-CM

## 2018-03-27 DIAGNOSIS — Z79899 Other long term (current) drug therapy: Secondary | ICD-10-CM

## 2018-03-27 DIAGNOSIS — C3411 Malignant neoplasm of upper lobe, right bronchus or lung: Secondary | ICD-10-CM

## 2018-03-27 DIAGNOSIS — K219 Gastro-esophageal reflux disease without esophagitis: Secondary | ICD-10-CM | POA: Diagnosis not present

## 2018-03-27 DIAGNOSIS — Z8249 Family history of ischemic heart disease and other diseases of the circulatory system: Secondary | ICD-10-CM

## 2018-03-27 DIAGNOSIS — Z7982 Long term (current) use of aspirin: Secondary | ICD-10-CM | POA: Diagnosis not present

## 2018-03-27 DIAGNOSIS — R918 Other nonspecific abnormal finding of lung field: Secondary | ICD-10-CM

## 2018-03-27 DIAGNOSIS — C3490 Malignant neoplasm of unspecified part of unspecified bronchus or lung: Secondary | ICD-10-CM

## 2018-03-27 DIAGNOSIS — I2510 Atherosclerotic heart disease of native coronary artery without angina pectoris: Secondary | ICD-10-CM | POA: Diagnosis not present

## 2018-03-27 DIAGNOSIS — C3412 Malignant neoplasm of upper lobe, left bronchus or lung: Secondary | ICD-10-CM | POA: Diagnosis not present

## 2018-03-27 DIAGNOSIS — I10 Essential (primary) hypertension: Secondary | ICD-10-CM | POA: Diagnosis not present

## 2018-03-27 DIAGNOSIS — E785 Hyperlipidemia, unspecified: Secondary | ICD-10-CM | POA: Insufficient documentation

## 2018-03-27 DIAGNOSIS — Z87891 Personal history of nicotine dependence: Secondary | ICD-10-CM | POA: Diagnosis not present

## 2018-03-27 DIAGNOSIS — C771 Secondary and unspecified malignant neoplasm of intrathoracic lymph nodes: Secondary | ICD-10-CM | POA: Diagnosis not present

## 2018-03-27 DIAGNOSIS — Z01818 Encounter for other preprocedural examination: Secondary | ICD-10-CM

## 2018-03-27 DIAGNOSIS — Z7902 Long term (current) use of antithrombotics/antiplatelets: Secondary | ICD-10-CM | POA: Insufficient documentation

## 2018-03-27 LAB — CBC WITH DIFFERENTIAL (CANCER CENTER ONLY)
Abs Immature Granulocytes: 0.03 10*3/uL (ref 0.00–0.07)
Basophils Absolute: 0 10*3/uL (ref 0.0–0.1)
Basophils Relative: 1 %
EOS PCT: 4 %
Eosinophils Absolute: 0.3 10*3/uL (ref 0.0–0.5)
HCT: 47.8 % (ref 39.0–52.0)
Hemoglobin: 15.6 g/dL (ref 13.0–17.0)
Immature Granulocytes: 0 %
Lymphocytes Relative: 20 %
Lymphs Abs: 1.6 10*3/uL (ref 0.7–4.0)
MCH: 30.2 pg (ref 26.0–34.0)
MCHC: 32.6 g/dL (ref 30.0–36.0)
MCV: 92.6 fL (ref 80.0–100.0)
Monocytes Absolute: 0.6 10*3/uL (ref 0.1–1.0)
Monocytes Relative: 8 %
Neutro Abs: 5.5 10*3/uL (ref 1.7–7.7)
Neutrophils Relative %: 67 %
Platelet Count: 189 10*3/uL (ref 150–400)
RBC: 5.16 MIL/uL (ref 4.22–5.81)
RDW: 13.7 % (ref 11.5–15.5)
WBC Count: 8 10*3/uL (ref 4.0–10.5)
nRBC: 0 % (ref 0.0–0.2)

## 2018-03-27 LAB — CMP (CANCER CENTER ONLY)
ALT: 10 U/L (ref 0–44)
ANION GAP: 9 (ref 5–15)
AST: 11 U/L — ABNORMAL LOW (ref 15–41)
Albumin: 4 g/dL (ref 3.5–5.0)
Alkaline Phosphatase: 115 U/L (ref 38–126)
BUN: 11 mg/dL (ref 8–23)
CO2: 26 mmol/L (ref 22–32)
Calcium: 8.7 mg/dL — ABNORMAL LOW (ref 8.9–10.3)
Chloride: 103 mmol/L (ref 98–111)
Creatinine: 1.29 mg/dL — ABNORMAL HIGH (ref 0.61–1.24)
GFR, Estimated: 55 mL/min — ABNORMAL LOW (ref >60)
Glucose, Bld: 112 mg/dL — ABNORMAL HIGH (ref 70–99)
Potassium: 4.2 mmol/L (ref 3.5–5.1)
SODIUM: 138 mmol/L (ref 135–145)
Total Bilirubin: 0.6 mg/dL (ref 0.3–1.2)
Total Protein: 6.9 g/dL (ref 6.5–8.1)

## 2018-03-27 NOTE — Progress Notes (Signed)
[place] Telephone:([phone]   Fax:([phone] Multidisciplinary thoracic oncology clinic  CONSULT NOTE  REFERRING PHYSICIAN: Dr. Leory Plowman Icard  REASON FOR CONSULTATION:  73 years old white male recently diagnosed with lung cancer.  HPI Seth [name] is a 73 y.o. male with past medical history significant for multiple medical problems including coronary artery disease, and anxiety, abdominal aortic aneurysm, hypertension, GERD, gout, migraine headache, dyslipidemia, sciatica, benign prostatic hypertrophy, peripheral vascular disease.  The patient had hemoptysis in November 2019 while on Plavix.  Chest x-ray was performed on December 24, 2017 and that showed persistent asymmetric irregular focal opacity in the apical right lung unchanged since December 05, 2017 suspicious for postinfectious scarring versus regular pulmonary nodule.  This was followed by CT scan of the chest without contrast on February 14, 2018 and that showed 2.1 cm central lingular mass with associated atelectasis highly suspicious for primary malignancy.  There was equivocal enlarged left hilar lymph node.  The patient underwent initial bronchoscopy by Dr. Valeta Harms but it was not conclusive for malignancy.  He had a PET scan on March 03, 2017 and that showed hypermetabolism corresponding to the spiculated central lingular pulmonary nodule measuring 1.9 x 2.2 cm with SUV max of 7.9.  There was also hypermetabolism corresponding to the enlarged left hilar lymph node which measured 1.1 cm with SUV max of 6.8.  The patient underwent repeat bronchoscopy with endobronchial ultrasound and electromagnetic navigation bronchoscopy under the care of Dr. Valeta Harms on March 19, 2018.  The final cytology ([id number] [id number]) from 11 L node showed malignant cells consistent with carcinoma favoring adenocarcinoma. Dr. Valeta Harms kindly referred the patient to the multidisciplinary thoracic oncology clinic today for further evaluation and  recommendation regarding treatment of his condition. When seen today the patient is feeling fine with no concerning complaints except for intermittent cough.  He denied having any chest pain, shortness of breath or hemoptysis.  He denied having any nausea, vomiting, diarrhea or constipation.  He denied having any headache or visual changes.  He has no weight loss or night sweats. Family history significant for mother with heart disease and stroke, father died from anorexia after the death of his wife. The patient is married and has 1 son.  He was accompanied today by his wife Alice.  He used to work as a Engineer, structural.  He has a history for smoking up to 3 packs/day for around 50 years and quit 2 years ago.  He drinks alcohol occasionally and no history of drug abuse.  HPI  Past Medical History:  Diagnosis Date  . AAA (abdominal aortic aneurysm) ([place])   . Allergic rhinitis   . Anxiety   . Back pain   . CAD (coronary artery disease)   . Cataracts, bilateral   . Emphysema, unspecified ([place])     " moderate"  . Essential hypertension, benign   . GERD (gastroesophageal reflux disease)   . Gout   . Headache    migraines  . Heart murmur    as a child only  . Hilar adenopathy   . History of hiatal hernia   . History of sciatica   . Hyperlipidemia   . Lung nodule   . Pneumonia    as child  . Prostate hypertrophy   . PVD (peripheral vascular disease) ([place])   . Seasonal allergies   . Subclavian steal syndrome of left subclavian artery   . Wears dentures   . Wears dentures  Past Surgical History:  Procedure Laterality Date  . CARDIAC CATHETERIZATION     stent placement  . COLONOSCOPY W/ BIOPSIES AND POLYPECTOMY    . ILIAC ARTERY STENT    . MULTIPLE TOOTH EXTRACTIONS    . VIDEO BRONCHOSCOPY WITH ENDOBRONCHIAL NAVIGATION N/A 02/26/2018   Procedure: VIDEO BRONCHOSCOPY WITH ENDOBRONCHIAL NAVIGATION;  Surgeon: Garner Nash, DO;  Location: [facility];  Service: Thoracic;   Laterality: N/A;  . VIDEO BRONCHOSCOPY WITH ENDOBRONCHIAL NAVIGATION N/A 03/19/2018   Procedure: VIDEO BRONCHOSCOPY WITH ENDOBRONCHIAL NAVIGATION and endobronchial ultrasound;  Surgeon: Garner Nash, DO;  Location: [facility];  Service: Thoracic;  Laterality: N/A;  . VIDEO BRONCHOSCOPY WITH ENDOBRONCHIAL ULTRASOUND N/A 02/26/2018   Procedure: VIDEO BRONCHOSCOPY WITH ENDOBRONCHIAL ULTRASOUND;  Surgeon: Garner Nash, DO;  Location: [facility];  Service: Thoracic;  Laterality: N/A;  . VIDEO BRONCHOSCOPY WITH ENDOBRONCHIAL ULTRASOUND N/A 03/19/2018   Procedure: NAVIGATION BRONCHOSCOPY;  Surgeon: Garner Nash, DO;  Location: [facility];  Service: Thoracic;  Laterality: N/A;    Family History  Problem Relation Age of Onset  . Hypertension Mother   . Heart disease Mother   . Stroke Mother   . Heart disease Paternal Grandmother   . Diabetes Paternal Grandmother   . Hyperlipidemia Paternal Grandmother   . Diabetes Sister   . Heart disease Sister   . Hyperlipidemia Sister   . Heart disease Brother   . Hyperlipidemia Brother   . Hypertension Brother   . Diabetes Paternal Grandfather   . Heart disease Paternal Grandfather   . Hyperlipidemia Paternal Grandfather   . Heart disease Maternal Uncle        x 2  . Cancer Maternal Grandmother        type unknown, ? lung  . Leukemia Maternal Uncle     Social History Social History   Tobacco Use  . Smoking status: Former Smoker    Packs/day: 3.00    Years: 50.00    Pack years: 150.00    Types: Cigarettes    Last attempt to quit: 04/05/2016    Years since quitting: 1.9  . Smokeless tobacco: Never Used  Substance Use Topics  . Alcohol use: Yes    Alcohol/week: 0.0 standard drinks    Comment: rare  . Drug use: No    Allergies  Allergen Reactions  . Biaxin [Clarithromycin] Shortness Of Breath  . Clarithromycin Shortness Of Breath  . Iodinated Diagnostic Agents Hives  . Prednisone Other (See Comments)    Patient states he turns REALLY RED all  over.  . Cortisone Other (See Comments)    Turns red from breast up  . Flomax [Tamsulosin Hcl]     Blurred vision and lower back pain   . Iodine Hives    Dye from nuclear medicine test    Current Outpatient Medications  Medication Sig Dispense Refill  . aspirin EC 81 MG tablet Take 81 mg by mouth daily.    Marland Kitchen atorvastatin (LIPITOR) 20 MG tablet TAKE 1 TABLET (20 MG TOTAL) BY MOUTH DAILY AT 6 PM. (Patient taking differently: Take 20 mg by mouth daily at 2 am. (0300 or 0400)) 90 tablet 1  . cetirizine (ZYRTEC) 10 MG tablet Take 10 mg by mouth daily at 12 noon.    . clobetasol cream (TEMOVATE) 6.29 % Apply 1 application topically 2 (two) times daily as needed (rash/skin irritation.).     Marland Kitchen clopidogrel (PLAVIX) 75 MG tablet Take 1 tablet (75 mg total) by mouth daily. (Patient taking differently:  Take 75 mg by mouth daily at 3 pm. ) 90 tablet 2  . clotrimazole (LOTRIMIN) 1 % cream Apply 1 application topically 2 (two) times daily. To affected area on groin. Use for 2 weeks, or at least 2 days after resolution of rash (Patient taking differently: Apply 1 application topically 2 (two) times daily as needed (rash/fungal infection.). To affected area on groin. Use for 2 weeks, or at least 2 days after resolution of rash) 30 g 0  . famotidine (PEPCID) 20 MG tablet Take 20 mg by mouth 2 (two) times daily. Noon & in the evening.    Marland Kitchen ipratropium (ATROVENT) 0.06 % nasal spray Place 1-2 sprays into both nostrils 4 (four) times daily as needed for rhinitis. (Patient taking differently: Place 1-2 sprays into both nostrils 2 (two) times daily. ) 15 mL 1  . lisinopril (PRINIVIL,ZESTRIL) 10 MG tablet TAKE 1 TABLET BY MOUTH EVERY DAY 90 tablet 1  . lisinopril (PRINIVIL,ZESTRIL) 5 MG tablet TAKE 1 TABLET BY MOUTH EVERY DAY (Patient taking differently: Take 5 mg by mouth daily at 3 pm. TAKE 1 TABLET BY MOUTH EVERY DAY) 90 tablet 1  . metoprolol tartrate (LOPRESSOR) 50 MG tablet TAKE 1.5 TABLETS (75 MG TOTAL) BY MOUTH  2 (TWO) TIMES DAILY. 270 tablet 1  . Triamcinolone Acetonide (NASACORT AQ NA) Place 1 spray into the nose daily at 2 am. (0300 or 0400)    . triamcinolone cream (KENALOG) 0.1 % Apply 1 application topically 2 (two) times daily as needed. (Patient taking differently: Apply 1 application topically 2 (two) times daily as needed (rash/skin irritation.). ) 30 g 0   Current Facility-Administered Medications  Medication Dose Route Frequency Provider Last Rate Last Dose  . 0.9 %  sodium chloride infusion  500 mL Intravenous Once Irene Shipper, MD        Review of Systems  Constitutional: negative Eyes: negative Ears, nose, mouth, throat, and face: negative Respiratory: positive for cough Cardiovascular: negative Gastrointestinal: negative Genitourinary:negative Integument/breast: negative Hematologic/lymphatic: negative Musculoskeletal:negative Neurological: negative Behavioral/Psych: negative Endocrine: negative Allergic/Immunologic: negative  Physical Exam  TFT:DDUKG, healthy, no distress, well nourished, well developed and anxious SKIN: skin color, texture, turgor are normal, no rashes or significant lesions HEAD: Normocephalic, No masses, lesions, tenderness or abnormalities EYES: normal, PERRLA, Conjunctiva are pink and non-injected EARS: External ears normal, Canals clear OROPHARYNX:no exudate, no erythema and lips, buccal mucosa, and tongue normal  NECK: supple, no adenopathy, no JVD LYMPH:  no palpable lymphadenopathy, no hepatosplenomegaly LUNGS: clear to auscultation , and palpation HEART: regular rate & rhythm, no murmurs and no gallops ABDOMEN:abdomen soft, non-tender, normal bowel sounds and no masses or organomegaly BACK: Back symmetric, no curvature., No CVA tenderness EXTREMITIES:no joint deformities, effusion, or inflammation, no edema  NEURO: alert & oriented x 3 with fluent speech, no focal motor/sensory deficits  PERFORMANCE STATUS: ECOG 1  LABORATORY  DATA: Lab Results  Component Value Date   WBC 7.0 03/19/2018   HGB 15.2 03/19/2018   HCT 47.9 03/19/2018   MCV 92.3 03/19/2018   PLT 215 03/19/2018      Chemistry      Component Value Date/Time   NA 138 03/19/2018 1059   NA 137 06/06/2017 1457   K 4.5 03/19/2018 1059   CL 105 03/19/2018 1059   CO2 19 (L) 03/19/2018 1059   BUN 10 03/19/2018 1059   BUN 10 06/06/2017 1457   CREATININE 1.32 (H) 03/19/2018 1059   CREATININE 1.09 03/23/2015 2542  Component Value Date/Time   CALCIUM 9.1 03/19/2018 1059   ALKPHOS 80 03/19/2018 1059   AST 17 03/19/2018 1059   ALT 15 03/19/2018 1059   BILITOT 0.8 03/19/2018 1059   BILITOT 0.5 04/23/2017 1024       RADIOGRAPHIC STUDIES: Nm Pet Image Initial (pi) Skull Base To Thigh  Result Date: 03/03/2018 CLINICAL DATA:  Initial treatment strategy for staging of left upper lobe pulmonary nodule. EXAM: NUCLEAR MEDICINE PET SKULL BASE TO THIGH TECHNIQUE: 11.3 mCi F-18 FDG was injected intravenously. Full-ring PET imaging was performed from the skull base to thigh after the radiotracer. CT data was obtained and used for attenuation correction and anatomic localization. Fasting blood glucose: 93 mg/dl COMPARISON:  Chest CT of 02/14/2018 FINDINGS: Mediastinal blood pool activity: SUV max 2.6 NECK: No areas of abnormal hypermetabolism. Incidental CT findings: Right carotid atherosclerosis. No cervical adenopathy. Mucosal thickening in the right maxillary sinus. CHEST: Hypermetabolism corresponding to the spiculated central lingular pulmonary nodule. Example at 1.9 x 2.2 cm and a S.U.V. max of 7.9 on image 45/8. Hypermetabolism corresponding to the enlarged left hilar node. This measures 1.1 cm and a S.U.V. max of 6.8 on image 75/4. Incidental CT findings: Deferred to recent diagnostic CT. Tiny hiatal hernia. Coronary and aortic atherosclerosis. Moderate centrilobular and paraseptal emphysema. ABDOMEN/PELVIS: No abdominopelvic parenchymal or nodal  hypermetabolism. Incidental CT findings: Normal adrenal glands. Bilateral renal vascular calcifications. Infrarenal abdominal aortic aneurysm at maximally 4.2 x 4.2 cm on image 136/4. Status post bilateral iliac stent grafts with borderline aneurysmal dilatation of the left common iliac at 2.0 cm on image 159/4. Mild prostatomegaly. SKELETON: No abnormal marrow activity. Incidental CT findings: Grade 2 L5-S1 anterolisthesis, with bilateral L5 pars defects. IMPRESSION: 1. Central lingular primary bronchogenic carcinoma with ipsilateral hilar nodal metastasis. P3XT0W4 or stage IIB 2. No evidence of extrathoracic hypermetabolic metastasis. 3. Aortic atherosclerosis (ICD10-I70.0), coronary artery atherosclerosis and emphysema (ICD10-J43.9). 4. Infrarenal aortic aneurysm of 4.2 cm. Recommend followup by ultrasound in 1 year. This recommendation follows ACR consensus guidelines: White Paper of the ACR Incidental Findings Committee II on Vascular Findings. J Am Coll Radiol 2013; 10:789-794. Electronically Signed   By: Abigail Miyamoto M.D.   On: 03/03/2018 14:38   Dg Chest Port 1 View  Result Date: 03/19/2018 CLINICAL DATA:  Post bronchoscopy. EXAM: PORTABLE CHEST 1 VIEW COMPARISON:  PET-CT 03/03/2018.  Radiographs 02/26/2018. FINDINGS: 1611 hours. The heart size and mediastinal contours are stable. There is aortic atherosclerosis. There are stable asymmetric interstitial opacities in the left lung, partly obscuring the left perihilar nodules. The right lung is clear. No evidence of pleural effusion or pneumothorax. IMPRESSION: Stable chest post bronchoscopy.  No pneumothorax. These results were called by telephone at the time of interpretation on 03/19/2018 at 4:34 pm to Richardson Landry in the PACU, who verbally acknowledged these results. Electronically Signed   By: Richardean Sale M.D.   On: 03/19/2018 16:34   Dg Chest Port 1 View  Result Date: 02/26/2018 CLINICAL DATA:  Status post bronchoscopy EXAM: PORTABLE CHEST 1 VIEW  COMPARISON:  Chest CT 02/14/2018 FINDINGS: Bilateral interstitial opacities left-greater-than-right. No pneumothorax. No focal airspace consolidation or pulmonary edema. Cardiomediastinal contours are normal. IMPRESSION: No pneumothorax following bronchoscopy. Electronically Signed   By: Ulyses Jarred M.D.   On: 02/26/2018 17:11   Dg C-arm Bronchoscopy  Result Date: 03/19/2018 C-ARM BRONCHOSCOPY: Fluoroscopy was utilized by the requesting physician.  No radiographic interpretation.   Dg C-arm Bronchoscopy  Result Date: 02/26/2018 C-ARM BRONCHOSCOPY: Fluoroscopy was utilized by  the requesting physician.  No radiographic interpretation.    ASSESSMENT: This is a very pleasant 73 years old white male recently diagnosed with a stage IIb (T1c, N1, M0) non-small cell lung cancer favoring adenocarcinoma presented with right upper lobe lung nodule in addition to right hilar lymphadenopathy diagnosed in February 2020.    PLAN: I had a lengthy discussion with the patient and his wife today about his current disease stage, prognosis and treatment options. I personally and independently reviewed the scan images and discussed the result and showed the images to the patient and his wife. The patient will have complete staging work-up by having MRI of the brain next week to rule out brain metastasis. I discussed with the patient his treatment options including consideration of surgical resection followed by adjuvant systemic chemotherapy. If the patient is not a good candidate for surgical resection, he may benefit from a course of concurrent chemoradiation with weekly carboplatin and paclitaxel. The patient will see Dr. Servando Snare later today for evaluation and discussion of the surgical option. I will arrange for him a follow-up appointment few weeks after his surgery for discussion of the adjuvant therapy. The patient was advised to call immediately if he has any concerning symptoms in the interval. The  patient voices understanding of current disease status and treatment options and is in agreement with the current care plan.  All questions were answered. The patient knows to call the clinic with any problems, questions or concerns. We can certainly see the patient much sooner if necessary.  Thank you so much for allowing me to participate in the care of [facility]. I will continue to follow up the patient with you and assist in his care.  I spent 40 minutes counseling the patient face to face. The total time spent in the appointment was 60 minutes.  Disclaimer: This note was dictated with voice recognition software. Similar sounding words can inadvertently be transcribed and may not be corrected upon review.   Eilleen Kempf March 27, 2018, 1:27 PM

## 2018-03-27 NOTE — Progress Notes (Unsigned)
CPX

## 2018-03-30 NOTE — Progress Notes (Signed)
[address]       [name] [address]             [phone]                    Seth [name] [facility] Record #[id number] Date of Birth: [birth year]  Referring: Garner Nash, DO Primary Care: [name], MD Primary Cardiologist: Dr Etter Sjogren  Chief Complaint:   No chief complaint on file.   History of Present Illness:    Seth [name] 73 y.o. male is seen in the office  today for for abnormal CT scan, PET scan and positive cytology for 11 L lymph node, biopsies cytology of left upper lobe lesion were negative for malignancy.  Patient notes episodic hemoptysis since Thanksgiving of 2019 CT scan of the chest and bronchoscopy on 2 different occasions was performed ultimately 11 R lymph node was positive for cytology  Patient is a long-term smoker quit years ago but prior to that smoked 2 packs a day for 30 years.  He has peripheral vascular disease including 4 cm abdominal aortic aneurysm.  He has been followed by Dr. Marlou Porch in the past.,  Patient has also had bilateral iliac stents placed by Dr. Maryjean Morn in [facility]   Patient does note shortness of breath with exertion.  Diagnosis Lung, biopsy, Left Upper Lingua - MINUTE STRIPS OF BENIGN BRONCHIAL EPITHELIUM PRESENT IN THE BACKGROUND OF A MAJORITY OF BLOOD. Microscopic Comment Intradepartmental consultation was obtained (Dr. Vic Ripper). Adequacy Reason Satisfactory For Evaluation. Diagnosis FINE NEEDLE ASPIRATION ENDOSCOPIC SPECIMEN A EBUS 11L NODE (SPECIMEN 1 OF 5, COLLECTED 03/19/2018): MALIGNANT CELLS CONSISTENT WITH CARCINOMA. SEE COMMENT. Preliminary Diagnosis ADEQUATE RECOMMENED OBTAINING MATERIAL FOR CELL BLOCK [name] [name] MD  Current Activity/ Functional Status:  Patient is independent with mobility/ambulation, transfers, ADL's, IADL's.   Zubrod Score: At the time of surgery this patient's most appropriate activity status/level should be described as: []     0    Normal  activity, no symptoms [x]     1    Restricted in physical strenuous activity but ambulatory, able to do out light work []     2    Ambulatory and capable of self care, unable to do work activities, up and about               >50 % of waking hours                              []     3    Only limited self care, in bed greater than 50% of waking hours []     4    Completely disabled, no self care, confined to bed or chair []     5    Moribund   Past Medical History:  Diagnosis Date  . AAA (abdominal aortic aneurysm) ([place])   . Allergic rhinitis   . Anxiety   . Back pain   . CAD (coronary artery disease)   . Cataracts, bilateral   . Emphysema, unspecified ([place])     " moderate"  . Essential hypertension, benign   . GERD (gastroesophageal reflux disease)   . Gout   . Headache    migraines  . Heart murmur    as a child only  . Hilar adenopathy   . History of hiatal hernia   . History of sciatica   . Hyperlipidemia   .  Lung nodule   . Pneumonia    as child  . Prostate hypertrophy   . PVD (peripheral vascular disease) ([place])   . Seasonal allergies   . Subclavian steal syndrome of left subclavian artery   . Wears dentures   . Wears dentures     Past Surgical History:  Procedure Laterality Date  . CARDIAC CATHETERIZATION     stent placement  . COLONOSCOPY W/ BIOPSIES AND POLYPECTOMY    . ILIAC ARTERY STENT    . MULTIPLE TOOTH EXTRACTIONS    . VIDEO BRONCHOSCOPY WITH ENDOBRONCHIAL NAVIGATION N/A 02/26/2018   Procedure: VIDEO BRONCHOSCOPY WITH ENDOBRONCHIAL NAVIGATION;  Surgeon: Garner Nash, DO;  Location: [facility];  Service: Thoracic;  Laterality: N/A;  . VIDEO BRONCHOSCOPY WITH ENDOBRONCHIAL NAVIGATION N/A 03/19/2018   Procedure: VIDEO BRONCHOSCOPY WITH ENDOBRONCHIAL NAVIGATION and endobronchial ultrasound;  Surgeon: Garner Nash, DO;  Location: [place];  Service: Thoracic;  Laterality: N/A;  . VIDEO BRONCHOSCOPY WITH ENDOBRONCHIAL ULTRASOUND N/A 02/26/2018   Procedure: VIDEO  BRONCHOSCOPY WITH ENDOBRONCHIAL ULTRASOUND;  Surgeon: Garner Nash, DO;  Location: [place];  Service: Thoracic;  Laterality: N/A;  . VIDEO BRONCHOSCOPY WITH ENDOBRONCHIAL ULTRASOUND N/A 03/19/2018   Procedure: NAVIGATION BRONCHOSCOPY;  Surgeon: Garner Nash, DO;  Location[facility];  Service: Thoracic;  Laterality: N/A;    Family History  Problem Relation Age of Onset  . Hypertension Mother   . Heart disease Mother   . Stroke Mother   . Heart disease Paternal Grandmother   . Diabetes Paternal Grandmother   . Hyperlipidemia Paternal Grandmother   . Diabetes Sister   . Heart disease Sister   . Hyperlipidemia Sister   . Heart disease Brother   . Hyperlipidemia Brother   . Hypertension Brother   . Diabetes Paternal Grandfather   . Heart disease Paternal Grandfather   . Hyperlipidemia Paternal Grandfather   . Heart disease Maternal Uncle        x 2  . Cancer Maternal Grandmother        type unknown, ? lung  . Leukemia Maternal Uncle      Social History   Tobacco Use  Smoking Status Former Smoker  . Packs/day: 3.00  . Years: 50.00  . Pack years: 150.00  . Types: Cigarettes  . Last attempt to quit: 04/05/2016  . Years since quitting: 1.9  Smokeless Tobacco Never Used    Social History   Substance and Sexual Activity  Alcohol Use Yes  . Alcohol/week: 0.0 standard drinks   Comment: rare     Allergies  Allergen Reactions  . Biaxin [Clarithromycin] Shortness Of Breath  . Clarithromycin Shortness Of Breath  . Iodinated Diagnostic Agents Hives  . Prednisone Other (See Comments)    Patient states he turns REALLY RED all over.  . Cortisone Other (See Comments)    Turns red from breast up  . Flomax [Tamsulosin Hcl]     Blurred vision and lower back pain   . Iodine Hives    Dye from nuclear medicine test    Current Outpatient Medications  Medication Sig Dispense Refill  . aspirin EC 81 MG tablet Take 81 mg by mouth daily.    Marland Kitchen atorvastatin (LIPITOR) 20 MG tablet  TAKE 1 TABLET (20 MG TOTAL) BY MOUTH DAILY AT 6 PM. (Patient taking differently: Take 20 mg by mouth daily at 2 am. (0300 or 0400)) 90 tablet 1  . cetirizine (ZYRTEC) 10 MG tablet Take 10 mg by mouth daily at 12 noon.    Marland Kitchen  clobetasol cream (TEMOVATE) 9.14 % Apply 1 application topically 2 (two) times daily as needed (rash/skin irritation.).     Marland Kitchen clopidogrel (PLAVIX) 75 MG tablet Take 1 tablet (75 mg total) by mouth daily. (Patient taking differently: Take 75 mg by mouth daily at 3 pm. ) 90 tablet 2  . clotrimazole (LOTRIMIN) 1 % cream Apply 1 application topically 2 (two) times daily. To affected area on groin. Use for 2 weeks, or at least 2 days after resolution of rash (Patient taking differently: Apply 1 application topically 2 (two) times daily as needed (rash/fungal infection.). To affected area on groin. Use for 2 weeks, or at least 2 days after resolution of rash) 30 g 0  . famotidine (PEPCID) 20 MG tablet Take 20 mg by mouth 2 (two) times daily. Noon & in the evening.    Marland Kitchen ipratropium (ATROVENT) 0.06 % nasal spray Place 1-2 sprays into both nostrils 4 (four) times daily as needed for rhinitis. (Patient taking differently: Place 1-2 sprays into both nostrils 2 (two) times daily. ) 15 mL 1  . lisinopril (PRINIVIL,ZESTRIL) 10 MG tablet TAKE 1 TABLET BY MOUTH EVERY DAY 90 tablet 1  . lisinopril (PRINIVIL,ZESTRIL) 5 MG tablet TAKE 1 TABLET BY MOUTH EVERY DAY (Patient taking differently: Take 5 mg by mouth daily at 3 pm. TAKE 1 TABLET BY MOUTH EVERY DAY) 90 tablet 1  . metoprolol tartrate (LOPRESSOR) 50 MG tablet TAKE 1.5 TABLETS (75 MG TOTAL) BY MOUTH 2 (TWO) TIMES DAILY. 270 tablet 1  . Triamcinolone Acetonide (NASACORT AQ NA) Place 1 spray into the nose daily at 2 am. (0300 or 0400)    . triamcinolone cream (KENALOG) 0.1 % Apply 1 application topically 2 (two) times daily as needed. (Patient taking differently: Apply 1 application topically 2 (two) times daily as needed (rash/skin irritation.). ) 30  g 0   Current Facility-Administered Medications  Medication Dose Route Frequency Provider Last Rate Last Dose  . 0.9 %  sodium chloride infusion  500 mL Intravenous Once Irene Shipper, MD        Pertinent items are noted in HPI.   Review of Systems:     Cardiac Review of Systems: [Y] = yes  or   [ N ] = no   Chest Pain [ n   ]  Resting SOB [ n  ] Exertional SOB  [ y ]  Orthopnea [ n ]   Pedal Edema [n   ]    Palpitations [ n ] Syncope  [ n ]   Presyncope [n   ]   General Review of Systems: [Y] = yes [  ]=no Constitional: recent weight change [  ];  Wt loss over the last 3 months [   ] anorexia [  ]; fatigue [  ]; nausea [  ]; night sweats [  ]; fever [  ]; or chills [  ];           Eye : blurred vision [  ]; diplopia [   ]; vision changes [  ];  Amaurosis fugax[  ]; Resp: cough Blue.Reese  ];  wheezing[ y ];  hemoptysis[y  ]; shortness of breath[ y ]; paroxysmal nocturnal dyspnea[  ]; dyspnea on exertion[  ]; or orthopnea[  ];  GI:  gallstones[  ], vomiting[  ];  dysphagia[  ]; melena[  ];  hematochezia [  ]; heartburn[  ];   Hx of  Colonoscopy[  ]; GU: kidney stones [  ];  hematuria[  ];   dysuria [  ];  nocturia[  ];  history of     obstruction [  ]; urinary frequency [y frequent and can not take flomax  ]             Skin: rash, swelling[  ];, hair loss[  ];  peripheral edema[  ];  or itching[  ]; Musculosketetal: myalgias[  ];  joint swelling[  ];  joint erythema[  ];  joint pain[  ];  back pain[  ];  Heme/Lymph: bruising[  ];  bleeding[  ];  anemia[  ];  Neuro: TIA[  ];  headaches[  ];  stroke[  ];  vertigo[  ];  seizures[  ];   paresthesias[  ];  difficulty walking[  ];  Psych:depression[  ]; anxiety[  ];  Endocrine: diabetes[  ];  thyroid dysfunction[  ];  Immunizations: Flu up to date [  ]; Pneumococcal up to date [  ];  Other:     PHYSICAL EXAMINATION: BP 133/79   Pulse 89   Temp 97.6 F (36.4 C)   Resp 19   Ht 6\' 2"  (1.88 m)   Wt 213 lb 4.8 oz (96.8 kg)   SpO2 100%   BMI  27.39 kg/m  General appearance: alert and cooperative Head: Normocephalic, without obvious abnormality, atraumatic Neck: no adenopathy, no carotid bruit, no JVD, supple, symmetrical, trachea midline and thyroid not enlarged, symmetric, no tenderness/mass/nodules Lymph nodes: Cervical, supraclavicular, and axillary nodes normal. Resp: clear to auscultation bilaterally Back: symmetric, no curvature. ROM normal. No CVA tenderness. Cardio: regular rate and rhythm, S1, S2 normal, no murmur, click, rub or gallop GI: soft, non-tender; bowel sounds normal; no masses,  no organomegaly Extremities: extremities normal, atraumatic, no cyanosis or edema Neurologic: Grossly normal  Diagnostic Studies & Laboratory data:     Recent Radiology Findings:   Nm Pet Image Initial (pi) Skull Base To Thigh  Result Date: 03/03/2018 CLINICAL DATA:  Initial treatment strategy for staging of left upper lobe pulmonary nodule. EXAM: NUCLEAR MEDICINE PET SKULL BASE TO THIGH TECHNIQUE: 11.3 mCi F-18 FDG was injected intravenously. Full-ring PET imaging was performed from the skull base to thigh after the radiotracer. CT data was obtained and used for attenuation correction and anatomic localization. Fasting blood glucose: 93 mg/dl COMPARISON:  Chest CT of 02/14/2018 FINDINGS: Mediastinal blood pool activity: SUV max 2.6 NECK: No areas of abnormal hypermetabolism. Incidental CT findings: Right carotid atherosclerosis. No cervical adenopathy. Mucosal thickening in the right maxillary sinus. CHEST: Hypermetabolism corresponding to the spiculated central lingular pulmonary nodule. Example at 1.9 x 2.2 cm and a S.U.V. max of 7.9 on image 45/8. Hypermetabolism corresponding to the enlarged left hilar node. This measures 1.1 cm and a S.U.V. max of 6.8 on image 75/4. Incidental CT findings: Deferred to recent diagnostic CT. Tiny hiatal hernia. Coronary and aortic atherosclerosis. Moderate centrilobular and paraseptal emphysema.  ABDOMEN/PELVIS: No abdominopelvic parenchymal or nodal hypermetabolism. Incidental CT findings: Normal adrenal glands. Bilateral renal vascular calcifications. Infrarenal abdominal aortic aneurysm at maximally 4.2 x 4.2 cm on image 136/4. Status post bilateral iliac stent grafts with borderline aneurysmal dilatation of the left common iliac at 2.0 cm on image 159/4. Mild prostatomegaly. SKELETON: No abnormal marrow activity. Incidental CT findings: Grade 2 L5-S1 anterolisthesis, with bilateral L5 pars defects. IMPRESSION: 1. Central lingular primary bronchogenic carcinoma with ipsilateral hilar nodal metastasis. G2XB2W4 or stage IIB 2. No evidence of extrathoracic hypermetabolic metastasis. 3. Aortic atherosclerosis (ICD10-I70.0), coronary artery atherosclerosis and  emphysema (ICD10-J43.9). 4. Infrarenal aortic aneurysm of 4.2 cm. Recommend followup by ultrasound in 1 year. This recommendation follows ACR consensus guidelines: White Paper of the ACR Incidental Findings Committee II on Vascular Findings. J Am Coll Radiol 2013; 10:789-794. Electronically Signed   By: Abigail Miyamoto M.D.   On: 03/03/2018 14:38   Dg Chest [facility]     I have independently reviewed the above radiology studies  and reviewed the findings with the patient.   Recent Lab Findings: Lab Results  Component Value Date   WBC 8.0 03/27/2018   HGB 15.6 03/27/2018   HCT 47.8 03/27/2018   PLT 189 03/27/2018   GLUCOSE 112 (H) 03/27/2018   CHOL 149 04/23/2017   TRIG 107 04/23/2017   HDL 40 04/23/2017   LDLCALC 88 04/23/2017   ALT 10 03/27/2018   AST 11 (L) 03/27/2018   NA 138 03/27/2018   K 4.2 03/27/2018   CL 103 03/27/2018   CREATININE 1.29 (H) 03/27/2018   BUN 11 03/27/2018   CO2 26 03/27/2018   TSH 1.65 03/23/2015   INR 1.00 03/19/2018   HGBA1C 6.2 (H) 04/23/2017   PFTs show FEV1 2.34 68% of predicted drop 15% with bronchodilator diffusion capacity 17.51 49% predicted   Assessment / Plan:   1/ 2.1 x 2.1 cm mass  within the central lingula portion of the left upper lobe, highly suspicious for malignancy, +11 L lymph node on ebus.  Consistent with clinical stage IIb non-small cell carcinoma of the lung. 2/patient with known peripheral vascular disease and at high risk for coronary artery disease.  Discussed with the patient considering surgical resection possibly with lingulectomy.  Prior to surgery he will need cardiac clearance, and CPX testing to further evaluate his pulmonary function  Is been referred to cardiology  We will see him back after cardiac clearance and CPX testing done       I  spent 60 minutes with  the patient face to face and greater then 50% of the time was spent in counseling and coordination of care.    Grace Isaac MD      [address] [place] [address] Office [phone]   Beeper [phone]  03/30/2018 10:18 PM

## 2018-03-31 ENCOUNTER — Ambulatory Visit (HOSPITAL_COMMUNITY)
Admission: RE | Admit: 2018-03-31 | Discharge: 2018-03-31 | Disposition: A | Discharge status: Home / Self Care | Payer: Medicare Other | Source: Ambulatory Visit | Attending: Pulmonary Disease | Admitting: Pulmonary Disease

## 2018-03-31 DIAGNOSIS — R590 Localized enlarged lymph nodes: Secondary | ICD-10-CM | POA: Diagnosis not present

## 2018-03-31 DIAGNOSIS — C3490 Malignant neoplasm of unspecified part of unspecified bronchus or lung: Secondary | ICD-10-CM | POA: Diagnosis not present

## 2018-03-31 DIAGNOSIS — C3412 Malignant neoplasm of upper lobe, left bronchus or lung: Secondary | ICD-10-CM | POA: Insufficient documentation

## 2018-03-31 DIAGNOSIS — R942 Abnormal results of pulmonary function studies: Secondary | ICD-10-CM | POA: Insufficient documentation

## 2018-03-31 DIAGNOSIS — R911 Solitary pulmonary nodule: Secondary | ICD-10-CM | POA: Insufficient documentation

## 2018-03-31 MED ORDER — GADOBUTROL 1 MMOL/ML IV SOLN
9.0000 mL | Freq: Once | INTRAVENOUS | Status: AC | PRN
  Administered 2018-03-31: 9 mL via INTRAVENOUS

## 2018-04-01 ENCOUNTER — Encounter: Payer: Self-pay | Admitting: Cardiology

## 2018-04-01 ENCOUNTER — Ambulatory Visit (INDEPENDENT_AMBULATORY_CARE_PROVIDER_SITE_OTHER): Payer: Medicare Other | Admitting: Cardiology

## 2018-04-01 ENCOUNTER — Encounter

## 2018-04-01 VITALS — BP 141/81 | HR 75 | Ht 74.0 in | Wt 212.1 lb

## 2018-04-01 DIAGNOSIS — I2510 Atherosclerotic heart disease of native coronary artery without angina pectoris: Secondary | ICD-10-CM

## 2018-04-01 DIAGNOSIS — I739 Peripheral vascular disease, unspecified: Secondary | ICD-10-CM | POA: Diagnosis not present

## 2018-04-01 DIAGNOSIS — E7800 Pure hypercholesterolemia, unspecified: Secondary | ICD-10-CM | POA: Diagnosis not present

## 2018-04-01 DIAGNOSIS — I10 Essential (primary) hypertension: Secondary | ICD-10-CM | POA: Diagnosis not present

## 2018-04-01 NOTE — Addendum Note (Signed)
Addended by: Sarina Ill on: 04/01/2018 12:37 PM   Modules accepted: Orders

## 2018-04-01 NOTE — Patient Instructions (Addendum)
Medication Instructions:  Your physician recommends that you continue on your current medications as directed. Please refer to the Current Medication list given to you today.  If you need a refill on your cardiac medications before your next appointment, please call your pharmacy.   Lab work: Future: 04/09/18: Fasting labs, Lipid and Liver  If you have labs (blood work) drawn today and your tests are completely normal, you will receive your results only by: Marland Kitchen MyChart Message (if you have MyChart) OR . A paper copy in the mail If you have any lab test that is abnormal or we need to change your treatment, we will call you to review the results.  Testing/Procedures: Your physician has requested that you have a lexiscan myoview. For further information please visit [email or web address]. Please follow instruction sheet, as given.  Your physician has requested that you have an echocardiogram. Echocardiography is a painless test that uses sound waves to create images of your heart. It provides your doctor with information about the size and shape of your heart and how well your heart's chambers and valves are working. This procedure takes approximately one hour. There are no restrictions for this procedure.  Follow-Up: At [facility], you and your health needs are our priority.  As part of our continuing mission to provide you with exceptional heart care, we have created designated Provider Care Teams.  These Care Teams include your primary Cardiologist (physician) and Advanced Practice Providers (APPs -  Physician Assistants and Nurse Practitioners) who all work together to provide you with the care you need, when you need it. You will need a follow up appointment in 1 years.  Please call our office 2 months in advance to schedule this appointment.  You may see Dr. Radford Pax or one of the following Advanced Practice Providers on your designated Care Team:   Lazy Y U, PA-C Melina Copa,  PA-C . Ermalinda Barrios, PA-C

## 2018-04-01 NOTE — Progress Notes (Signed)
Cardiology Office Note    Date:  04/01/2018   ID:  Seth [name], DOB [birth year], MRN [id number]  PCP:  [name], MD  Cardiologist:  Fransico Him, MD   Chief Complaint  Patient presents with  . Coronary Artery Disease  . Hypertension  . Hyperlipidemia    History of Present Illness:  Seth [name] is a 73 y.o. male who is being seen today for the evaluation of Nonobstructive CAD at the request of [name], MD.  Is a very pleasant 73 year old male with a history of nonobstructive CAD by cath after stress test showed transient ischemic dilation and has been on medical therapy, tobacco abuse, hypertension, hyperlipidemia, PVD with abdominal aortic aneurysm and bilateral iliac stents followed by Dr. Maryjean Morn in [facility].  He was lost to follow-up by Dr. Marlou Porch and is now here for preoperative cardiac clearance prior to undergoing lung resection for a pulmonary nodule.  He has a long history of tobacco use smoking 2 packs/day for 30 years and quit 2 years ago.  He has had hemoptysis since Thanksgiving and was found to have a 2.1 x 2.1 cm mass in the sacral lingula of the left upper lobe suspicious for malignancy and cytology consistent with stage IIb non-small cell lung CA.   Dr. Servando Snare is referred him here for cardiac work-up prior to undergoing lung resection.  He denies any chest pain or pressure, SOB, DOE, PND, orthopnea, LE edema, dizziness, palpitations or syncope. He is compliant with his meds and is tolerating meds with no SE.    Past Medical History:  Diagnosis Date  . AAA (abdominal aortic aneurysm) ([place])   . Allergic rhinitis   . Anxiety   . Back pain   . CAD (coronary artery disease)   . Cataracts, bilateral   . Emphysema, unspecified ([place])     " moderate"  . Essential hypertension, benign   . GERD (gastroesophageal reflux disease)   . Gout   . Headache    migraines  . Heart murmur    as a child only  . Hilar adenopathy   . History of hiatal hernia   .  History of sciatica   . Hyperlipidemia   . Lung nodule   . Pneumonia    as child  . Prostate hypertrophy   . PVD (peripheral vascular disease) ([place])   . Seasonal allergies   . Subclavian steal syndrome of left subclavian artery   . Wears dentures   . Wears dentures     Past Surgical History:  Procedure Laterality Date  . CARDIAC CATHETERIZATION     stent placement  . COLONOSCOPY W/ BIOPSIES AND POLYPECTOMY    . ILIAC ARTERY STENT    . MULTIPLE TOOTH EXTRACTIONS    . VIDEO BRONCHOSCOPY WITH ENDOBRONCHIAL NAVIGATION N/A 02/26/2018   Procedure: VIDEO BRONCHOSCOPY WITH ENDOBRONCHIAL NAVIGATION;  Surgeon: Garner Nash, DO;  Location: [facility];  Service: Thoracic;  Laterality: N/A;  . VIDEO BRONCHOSCOPY WITH ENDOBRONCHIAL NAVIGATION N/A 03/19/2018   Procedure: VIDEO BRONCHOSCOPY WITH ENDOBRONCHIAL NAVIGATION and endobronchial ultrasound;  Surgeon: Garner Nash, DO;  Location: [facility];  Service: Thoracic;  Laterality: N/A;  . VIDEO BRONCHOSCOPY WITH ENDOBRONCHIAL ULTRASOUND N/A 02/26/2018   Procedure: VIDEO BRONCHOSCOPY WITH ENDOBRONCHIAL ULTRASOUND;  Surgeon: Garner Nash, DO;  Location: [facility];  Service: Thoracic;  Laterality: N/A;  . VIDEO BRONCHOSCOPY WITH ENDOBRONCHIAL ULTRASOUND N/A 03/19/2018   Procedure: NAVIGATION BRONCHOSCOPY;  Surgeon: Garner Nash, DO;  Location: [facility];  Service: Thoracic;  Laterality: N/A;    Current Medications: No outpatient medications have been marked as taking for the 04/01/18 encounter (Office Visit) with Sueanne Margarita, MD.   Current Facility-Administered Medications for the 04/01/18 encounter (Office Visit) with Sueanne Margarita, MD  Medication  . 0.9 %  sodium chloride infusion    Allergies:   Biaxin [clarithromycin]; Clarithromycin; Iodinated diagnostic agents; Prednisone; Cortisone; Flomax [tamsulosin hcl]; and Iodine   Social History   Socioeconomic History  . Marital status: Married    Spouse name: Not on file  . Number of children:  1  . Years of education: Not on file  . Highest education level: Not on file  Occupational History  . Occupation: retired  Scientific laboratory technician  . Financial resource strain: Not hard at all  . Food insecurity:    Worry: Never true    Inability: Never true  . Transportation needs:    Medical: No    Non-medical: No  Tobacco Use  . Smoking status: Former Smoker    Packs/day: 3.00    Years: 50.00    Pack years: 150.00    Types: Cigarettes    Last attempt to quit: 04/05/2016    Years since quitting: 1.9  . Smokeless tobacco: Never Used  Substance and Sexual Activity  . Alcohol use: Yes    Alcohol/week: 0.0 standard drinks    Comment: rare  . Drug use: No  . Sexual activity: Not on file  Lifestyle  . Physical activity:    Days per week: 0 days    Minutes per session: 0 min  . Stress: Not at all  Relationships  . Social connections:    Talks on phone: More than three times a week    Gets together: More than three times a week    Attends religious service: Never    Active member of club or organization: No    Attends meetings of clubs or organizations: Never    Relationship status: Married  Other Topics Concern  . Not on file  Social History Narrative   Married   Exercise: No   Education: GED     Family History:  The patient's family history includes Cancer in his maternal grandmother; Diabetes in his paternal grandfather, paternal grandmother, and sister; Heart disease in his brother, maternal uncle, mother, paternal grandfather, paternal grandmother, and sister; Hyperlipidemia in his brother, paternal grandfather, paternal grandmother, and sister; Hypertension in his brother and mother; Leukemia in his maternal uncle; Stroke in his mother.   ROS:   Please see the history of present illness.    ROS All other systems reviewed and are negative.  No flowsheet data found.     PHYSICAL EXAM:   VS:  There were no vitals taken for this visit.   GEN: Well nourished, well  developed, in no acute distress  HEENT: normal  Neck: no JVD, carotid bruits, or masses Cardiac: RRR; no murmurs, rubs, or gallops,no edema.  Intact distal pulses bilaterally.  Respiratory:  clear to auscultation bilaterally, normal work of breathing GI: soft, nontender, nondistended, + BS MS: no deformity or atrophy  Skin: warm and dry, no rash Neuro:  Alert and Oriented x 3, Strength and sensation are intact Psych: euthymic mood, full affect  Wt Readings from Last 3 Encounters:  03/27/18 213 lb 4.8 oz (96.8 kg)  03/27/18 213 lb 4.8 oz (96.8 kg)  03/19/18 214 lb (97.1 kg)      Studies/Labs Reviewed:   EKG:  EKG  is not ordered today.    Recent Labs: 03/27/2018: ALT 10; BUN 11; Creatinine 1.29; Hemoglobin 15.6; Platelet Count 189; Potassium 4.2; Sodium 138   Lipid Panel    Component Value Date/Time   CHOL 149 04/23/2017 1024   TRIG 107 04/23/2017 1024   HDL 40 04/23/2017 1024   CHOLHDL 3.7 04/23/2017 1024   CHOLHDL 3.9 03/23/2015 0928   VLDL 20 03/23/2015 0928   LDLCALC 88 04/23/2017 1024    Additional studies/ records that were reviewed today include:  Office notes    ASSESSMENT:    1. Coronary artery disease involving native coronary artery of native heart without angina pectoris   2. PVD (peripheral vascular disease) ([place])   3. Essential hypertension, benign   4. Pure hypercholesterolemia      PLAN:  In order of problems listed above:  1.  ASCAD - cardiac cath in 2014 showed nonobstructive CAD with 40% stenosis in the mid LAD after the takeoff of the first diagonal, patent left circumflex with OM x2, patent RCA with 30 to 40% diffuse irregularities in the proximal portion.  He has not had any anginal chest pain.  He stopped smoking 2 years ago.  EKG shows no acute ST changes.  I recommended proceeding with Lexiscan Myoview to rule out ischemia prior to him undergoing lung surgery.  I am going also order a 2D echocardiogram to make sure LV function remains  preserved.  He will continue on aspirin 81 mg daily, statin, beta-blocker and Plavix 75 mg daily but okay to hold for surgery.  2.  PVD - he has had bilateral iliac stenting and has a known abdominal aortic aneurysm at 4 cm followed by vascular surgery.  Continue on aspirin and Plavix but okay to hold for surgery.  3.  Hypertension -BP is well controlled on exam today.  He will continue on Lopressor 75 mg twice daily and lisinopril 15 mg daily.  4.  Hyperlipidemia -LDL was slightly above goal at 88 on 04/23/2017.  I will repeat this.  His LDL goal is less than 70.  He will continue on atorvastatin 20 mg daily.    Medication Adjustments/Labs and Tests Ordered: Current medicines are reviewed at length with the patient today.  Concerns regarding medicines are outlined above.  Medication changes, Labs and Tests ordered today are listed in the Patient Instructions below.  There are no Patient Instructions on file for this visit.   Signed, Fransico Him, MD  04/01/2018 11:45 AM    [address], [place], [place]  [address] Phone: [phone]; Fax: [phone]

## 2018-04-03 ENCOUNTER — Telehealth (HOSPITAL_COMMUNITY): Payer: Self-pay | Admitting: *Deleted

## 2018-04-03 NOTE — Telephone Encounter (Signed)
Patient given detailed instructions per Myocardial Perfusion Study Information Sheet for the test on 04/08/18. Patient notified to arrive 15 minutes early and that it is imperative to arrive on time for appointment to keep from having the test rescheduled.  If you need to cancel or reschedule your appointment, please call the office within 24 hours of your appointment. . Patient verbalized understanding. .me

## 2018-04-08 ENCOUNTER — Other Ambulatory Visit: Payer: Medicare Other

## 2018-04-08 ENCOUNTER — Ambulatory Visit (HOSPITAL_COMMUNITY): Payer: Medicare Other | Attending: Cardiovascular Disease

## 2018-04-08 ENCOUNTER — Ambulatory Visit (HOSPITAL_BASED_OUTPATIENT_CLINIC_OR_DEPARTMENT_OTHER): Payer: Medicare Other

## 2018-04-08 DIAGNOSIS — I2510 Atherosclerotic heart disease of native coronary artery without angina pectoris: Secondary | ICD-10-CM

## 2018-04-08 DIAGNOSIS — E7800 Pure hypercholesterolemia, unspecified: Secondary | ICD-10-CM

## 2018-04-08 LAB — HEPATIC FUNCTION PANEL
ALT: 14 IU/L (ref 0–44)
AST: 13 IU/L (ref 0–40)
Albumin: 4.2 g/dL (ref 3.7–4.7)
Alkaline Phosphatase: 111 IU/L (ref 39–117)
Bilirubin Total: 0.5 mg/dL (ref 0.0–1.2)
Bilirubin, Direct: 0.16 mg/dL (ref 0.00–0.40)
Total Protein: 6.4 g/dL (ref 6.0–8.5)

## 2018-04-08 LAB — LIPID PANEL
CHOL/HDL RATIO: 3.6 ratio (ref 0.0–5.0)
Cholesterol, Total: 138 mg/dL (ref 100–199)
HDL: 38 mg/dL — ABNORMAL LOW (ref >39)
LDL Calculated: 80 mg/dL (ref 0–99)
Triglycerides: 101 mg/dL (ref 0–149)
VLDL Cholesterol Cal: 20 mg/dL (ref 5–40)

## 2018-04-08 LAB — MYOCARDIAL PERFUSION IMAGING
LV dias vol: 71 mL (ref 62–150)
LVSYSVOL: 25 mL
Peak HR: 88 {beats}/min
Rest HR: 66 {beats}/min
SDS: 0
SRS: 0
SSS: 0
TID: 1.01

## 2018-04-08 LAB — ECHOCARDIOGRAM COMPLETE
Height: 72 in
Weight: 3392 oz

## 2018-04-08 MED ORDER — REGADENOSON 0.4 MG/5ML IV SOLN
0.4000 mg | Freq: Once | INTRAVENOUS | Status: AC
  Administered 2018-04-08: 0.4 mg via INTRAVENOUS

## 2018-04-08 MED ORDER — TECHNETIUM TC 99M TETROFOSMIN IV KIT
10.0000 | PACK | Freq: Once | INTRAVENOUS | Status: AC | PRN
  Administered 2018-04-08: 10 via INTRAVENOUS
  Filled 2018-04-08: qty 10

## 2018-04-08 MED ORDER — TECHNETIUM TC 99M TETROFOSMIN IV KIT
31.8000 | PACK | Freq: Once | INTRAVENOUS | Status: AC | PRN
  Administered 2018-04-08: 31.8 via INTRAVENOUS
  Filled 2018-04-08: qty 32

## 2018-04-09 ENCOUNTER — Telehealth: Payer: Self-pay

## 2018-04-09 ENCOUNTER — Other Ambulatory Visit: Payer: Medicare Other

## 2018-04-09 DIAGNOSIS — E7800 Pure hypercholesterolemia, unspecified: Secondary | ICD-10-CM

## 2018-04-09 MED ORDER — ATORVASTATIN CALCIUM 40 MG PO TABS: 40.0000 mg | ORAL_TABLET | Freq: Every day | ORAL | 3 refills | Status: DC

## 2018-04-09 NOTE — Telephone Encounter (Signed)
Spoke with the patient, he expressed understanding about increasing his Lipitor, he will call to schedule 6 week repeat fasting labs closer to the day. He had no further questions.

## 2018-04-09 NOTE — Telephone Encounter (Signed)
-----   Message from Sueanne Margarita, MD sent at 04/08/2018  6:11 PM EST ----- LDL not at goal - increase lipitor to 40mg  daily and repeat FLP and ALT in 6 weeks

## 2018-04-14 ENCOUNTER — Other Ambulatory Visit (HOSPITAL_COMMUNITY): Payer: Self-pay | Admitting: *Deleted

## 2018-04-14 ENCOUNTER — Other Ambulatory Visit: Payer: Self-pay

## 2018-04-14 ENCOUNTER — Ambulatory Visit (HOSPITAL_COMMUNITY): Payer: Medicare Other | Attending: Cardiothoracic Surgery

## 2018-04-14 DIAGNOSIS — Z01818 Encounter for other preprocedural examination: Secondary | ICD-10-CM

## 2018-04-14 DIAGNOSIS — H5203 Hypermetropia, bilateral: Secondary | ICD-10-CM | POA: Diagnosis not present

## 2018-04-14 DIAGNOSIS — J449 Chronic obstructive pulmonary disease, unspecified: Secondary | ICD-10-CM | POA: Diagnosis not present

## 2018-04-14 DIAGNOSIS — Z01811 Encounter for preprocedural respiratory examination: Secondary | ICD-10-CM | POA: Diagnosis not present

## 2018-04-14 DIAGNOSIS — H2513 Age-related nuclear cataract, bilateral: Secondary | ICD-10-CM | POA: Diagnosis not present

## 2018-04-14 DIAGNOSIS — Z6827 Body mass index (BMI) 27.0-27.9, adult: Secondary | ICD-10-CM | POA: Insufficient documentation

## 2018-04-14 DIAGNOSIS — E669 Obesity, unspecified: Secondary | ICD-10-CM | POA: Insufficient documentation

## 2018-04-15 ENCOUNTER — Ambulatory Visit (INDEPENDENT_AMBULATORY_CARE_PROVIDER_SITE_OTHER): Payer: Medicare Other | Admitting: Cardiothoracic Surgery

## 2018-04-15 VITALS — BP 132/68 | HR 72 | Resp 20 | Ht 74.0 in | Wt 215.0 lb

## 2018-04-15 DIAGNOSIS — C3412 Malignant neoplasm of upper lobe, left bronchus or lung: Secondary | ICD-10-CM | POA: Diagnosis not present

## 2018-04-15 DIAGNOSIS — I2510 Atherosclerotic heart disease of native coronary artery without angina pectoris: Secondary | ICD-10-CM

## 2018-04-15 NOTE — Progress Notes (Signed)
[address]       [place] [address]             [phone]                    [place] Medical Record #[id number] Date of Birth: Jul 30, 1945  Referring: Garner Nash, DO Primary Care: [name], MD Primary Cardiologist: Dr Etter Sjogren  Chief Complaint:    Chief Complaint  Patient presents with  . Lung Lesion    f/u to review Cardiopulmonary exercise test 04/16/18    History of Present Illness:    Seth [name] 73 y.o. male is seen in the office last week  for for abnormal CT scan, PET scan and positive cytology for 11 L lymph node, biopsies cytology of left upper lobe lesion were negative for malignancy.  Patient notes episodic hemoptysis since Thanksgiving of 2019 CT scan of the chest and bronchoscopy on 2 different occasions was performed ultimately 11 R lymph node was positive for cytology  Patient is a long-term smoker quit years ago but prior to that smoked 2 packs a day for 30 years.  He has peripheral vascular disease including 4 cm abdominal aortic aneurysm.  He has been followed by Dr. Marlou Porch in the past.,  Patient has also had bilateral iliac stents placed by Dr. Maryjean Morn in [facility]  He has had CPX testing done last week, and cardiac stress test. Stress test did did not show evidence of cardiac ischemia.  The patient's CPX testing did show severe diffusion deficit, with maximum O2 consumption in the range to predict high risk of postoperative complications with thoracic surgery.   Patient does note shortness of breath with exertion.  Patient's wife notes that he is very inactive sitting around the house most of the time, likely does not walk more than 500 steps in a day.   Diagnosis Lung, biopsy, Left Upper Lingua - MINUTE STRIPS OF BENIGN BRONCHIAL EPITHELIUM PRESENT IN THE BACKGROUND OF A MAJORITY OF BLOOD. Microscopic Comment Intradepartmental consultation was obtained (Dr. Vic Ripper). Adequacy Reason Satisfactory For  Evaluation. Diagnosis FINE NEEDLE ASPIRATION ENDOSCOPIC SPECIMEN A EBUS 11L NODE (SPECIMEN 1 OF 5, COLLECTED 03/19/2018): MALIGNANT CELLS CONSISTENT WITH CARCINOMA. SEE COMMENT. Preliminary Diagnosis ADEQUATE RECOMMENED OBTAINING MATERIAL FOR CELL BLOCK [facility]) [name] MD  Current Activity/ Functional Status:  Patient is independent with mobility/ambulation, transfers, ADL's, IADL's.   Zubrod Score: At the time of surgery this patient's most appropriate activity status/level should be described as: []     0    Normal activity, no symptoms [x]     1    Restricted in physical strenuous activity but ambulatory, able to do out light work []     2    Ambulatory and capable of self care, unable to do work activities, up and about               >50 % of waking hours                              []     3    Only limited self care, in bed greater than 50% of waking hours []     4    Completely disabled, no self care, confined to bed or chair []     5    Moribund   Past Medical History:  Diagnosis Date  . AAA (abdominal  aortic aneurysm) ([place])   . Allergic rhinitis   . Anxiety   . Back pain   . CAD (coronary artery disease)   . Cataracts, bilateral   . Emphysema, unspecified ([place])     " moderate"  . Essential hypertension, benign   . GERD (gastroesophageal reflux disease)   . Gout   . Headache    migraines  . Heart murmur    as a child only  . Hilar adenopathy   . History of hiatal hernia   . History of sciatica   . Hyperlipidemia   . Lung nodule   . Pneumonia    as child  . Prostate hypertrophy   . PVD (peripheral vascular disease) ([place])   . Seasonal allergies   . Subclavian steal syndrome of left subclavian artery   . Wears dentures   . Wears dentures     Past Surgical History:  Procedure Laterality Date  . CARDIAC CATHETERIZATION     stent placement  . COLONOSCOPY W/ BIOPSIES AND POLYPECTOMY    . ILIAC ARTERY STENT    . MULTIPLE TOOTH EXTRACTIONS    . VIDEO  BRONCHOSCOPY WITH ENDOBRONCHIAL NAVIGATION N/A 02/26/2018   Procedure: VIDEO BRONCHOSCOPY WITH ENDOBRONCHIAL NAVIGATION;  Surgeon: Garner Nash, DO;  Location: [facility];  Service: Thoracic;  Laterality: N/A;  . VIDEO BRONCHOSCOPY WITH ENDOBRONCHIAL NAVIGATION N/A 03/19/2018   Procedure: VIDEO BRONCHOSCOPY WITH ENDOBRONCHIAL NAVIGATION and endobronchial ultrasound;  Surgeon: Garner Nash, DO;  Location: [facility];  Service: Thoracic;  Laterality: N/A;  . VIDEO BRONCHOSCOPY WITH ENDOBRONCHIAL ULTRASOUND N/A 02/26/2018   Procedure: VIDEO BRONCHOSCOPY WITH ENDOBRONCHIAL ULTRASOUND;  Surgeon: Garner Nash, DO;  Location: [facility];  Service: Thoracic;  Laterality: N/A;  . VIDEO BRONCHOSCOPY WITH ENDOBRONCHIAL ULTRASOUND N/A 03/19/2018   Procedure: NAVIGATION BRONCHOSCOPY;  Surgeon: Garner Nash, DO;  Location[facility];  Service: Thoracic;  Laterality: N/A;    Family History  Problem Relation Age of Onset  . Hypertension Mother   . Heart disease Mother   . Stroke Mother   . Heart disease Paternal Grandmother   . Diabetes Paternal Grandmother   . Hyperlipidemia Paternal Grandmother   . Diabetes Sister   . Heart disease Sister   . Hyperlipidemia Sister   . Heart disease Brother   . Hyperlipidemia Brother   . Hypertension Brother   . Diabetes Paternal Grandfather   . Heart disease Paternal Grandfather   . Hyperlipidemia Paternal Grandfather   . Heart disease Maternal Uncle        x 2  . Cancer Maternal Grandmother        type unknown, ? lung  . Leukemia Maternal Uncle      Social History   Tobacco Use  Smoking Status Former Smoker  . Packs/day: 3.00  . Years: 50.00  . Pack years: 150.00  . Types: Cigarettes  . Last attempt to quit: 04/05/2016  . Years since quitting: 2.0  Smokeless Tobacco Never Used    Social History   Substance and Sexual Activity  Alcohol Use Yes  . Alcohol/week: 0.0 standard drinks   Comment: rare     Allergies  Allergen Reactions  . Biaxin  [Clarithromycin] Shortness Of Breath  . Clarithromycin Shortness Of Breath  . Iodinated Diagnostic Agents Hives  . Prednisone Other (See Comments)    Patient states he turns REALLY RED all over.  . Cortisone Other (See Comments)    Turns red from breast up  . Flomax [Tamsulosin Hcl]  Blurred vision and lower back pain   . Iodine Hives    Dye from nuclear medicine test    Current Outpatient Medications  Medication Sig Dispense Refill  . aspirin EC 81 MG tablet Take 81 mg by mouth daily as needed for mild pain.    Marland Kitchen atorvastatin (LIPITOR) 40 MG tablet Take 1 tablet (40 mg total) by mouth daily. 90 tablet 3  . cetirizine (ZYRTEC) 10 MG tablet Take 10 mg by mouth daily at 12 noon.    . clobetasol cream (TEMOVATE) 7.37 % Apply 1 application topically 2 (two) times daily as needed (rash/skin irritation.).     Marland Kitchen clopidogrel (PLAVIX) 75 MG tablet Take 1 tablet (75 mg total) by mouth daily. (Patient taking differently: Take 75 mg by mouth daily at 3 pm. ) 90 tablet 2  . clotrimazole (LOTRIMIN) 1 % cream Apply 1 application topically 2 (two) times daily. To affected area on groin. Use for 2 weeks, or at least 2 days after resolution of rash (Patient taking differently: Apply 1 application topically 2 (two) times daily as needed (rash/fungal infection.). To affected area on groin. Use for 2 weeks, or at least 2 days after resolution of rash) 30 g 0  . famotidine (PEPCID) 20 MG tablet Take 20 mg by mouth 2 (two) times daily. Noon & in the evening.    Marland Kitchen ipratropium (ATROVENT) 0.06 % nasal spray Place 1-2 sprays into both nostrils 4 (four) times daily as needed for rhinitis. (Patient taking differently: Place 1-2 sprays into both nostrils 2 (two) times daily. ) 15 mL 1  . lisinopril (PRINIVIL,ZESTRIL) 10 MG tablet TAKE 1 TABLET BY MOUTH EVERY DAY 90 tablet 1  . lisinopril (PRINIVIL,ZESTRIL) 5 MG tablet TAKE 1 TABLET BY MOUTH EVERY DAY (Patient taking differently: Take 5 mg by mouth daily at 3 pm. TAKE 1  TABLET BY MOUTH EVERY DAY) 90 tablet 1  . metoprolol tartrate (LOPRESSOR) 50 MG tablet TAKE 1.5 TABLETS (75 MG TOTAL) BY MOUTH 2 (TWO) TIMES DAILY. 270 tablet 1  . Triamcinolone Acetonide (NASACORT AQ NA) Place 1 spray into the nose daily at 2 am. (0300 or 0400)    . triamcinolone cream (KENALOG) 0.1 % Apply 1 application topically 2 (two) times daily as needed. (Patient taking differently: Apply 1 application topically 2 (two) times daily as needed (rash/skin irritation.). ) 30 g 0   Current Facility-Administered Medications  Medication Dose Route Frequency Provider Last Rate Last Dose  . 0.9 %  sodium chloride infusion  500 mL Intravenous Once Irene Shipper, MD        Pertinent items are noted in HPI.   Review of Systems:     Cardiac Review of Systems: [Y] = yes  or   [ N ] = no   Chest Pain [N  Resting SOB [ N ] Exertional SOB  Y]  Orthopnea [ N ]   Pedal Edema Aqua.Slicker  ]    Palpitations [ N ] Syncope  [ N ]   Presyncope Aqua.Slicker  ]   General Review of Systems: [Y] = yes [  ]=no Constitional: recent weight change [  ];  Wt loss over the last 3 months [   ] anorexia [  ]; fatigue [  ]; nausea [  ]; night sweats [  ]; fever [  ]; or chills [  ];           Eye : blurred vision [  ]; diplopia [   ];  vision changes [  ];  Amaurosis fugax[  ]; Resp: cough [Y ];  wheezing[Y ];  hemoptysis[Y ]; shortness of breath[Y ]; paroxysmal nocturnal dyspnea[  ]; dyspnea on exertion[  ]; or orthopnea[  ];  GI:  gallstones[  ], vomiting[  ];  dysphagia[  ]; melena[  ];  hematochezia [  ]; heartburn[  ];   Hx of  Colonoscopy[  ]; GU: kidney stones [  ]; hematuria[  ];   dysuria [  ];  nocturia[  ];  history of     obstruction [  ]; urinary frequency [y frequent and can not take flomax  ]             Skin: rash, swelling[  ];, hair loss[  ];  peripheral edema[  ];  or itching[  ]; Musculosketetal: myalgias[  ];  joint swelling[  ];  joint erythema[  ];  joint pain[  ];  back pain[  ];  Heme/Lymph: bruising[  ];   bleeding[  ];  anemia[  ];  Neuro: TIA[  ];  headaches[  ];  stroke[  ];  vertigo[  ];  seizures[  ];   paresthesias[  ];  difficulty walking[  ];  Psych:depression[  ]; anxiety[  ];  Endocrine: diabetes[  ];  thyroid dysfunction[  ];  Immunizations: Flu up to date [  ]; Pneumococcal up to date [  ];  Other:     PHYSICAL EXAMINATION: BP 132/68   Pulse 72   Resp 20   Ht 6\' 2"  (1.88 m)   Wt 215 lb (97.5 kg)   SpO2 99% Comment: RA  BMI 27.60 kg/m  General appearance: alert, cooperative and appears older than stated age Head: Normocephalic, without obvious abnormality, atraumatic Neck: no adenopathy, no carotid bruit, no JVD, supple, symmetrical, trachea midline and thyroid not enlarged, symmetric, no tenderness/mass/nodules Lymph nodes: Cervical, supraclavicular, and axillary nodes normal. Resp: diminished breath sounds bibasilar Back: symmetric, no curvature. ROM normal. No CVA tenderness. Cardio: regular rate and rhythm, S1, S2 normal, no murmur, click, rub or gallop GI: soft, non-tender; bowel sounds normal; no masses,  no organomegaly Extremities: extremities normal, atraumatic, no cyanosis or edema and Homans sign is negative, no sign of DVT Neurologic: Grossly normal  Diagnostic Studies & Laboratory data:     Recent Radiology Findings:   Nm Pet Image Initial (pi) Skull Base To Thigh  Result Date: 03/03/2018 CLINICAL DATA:  Initial treatment strategy for staging of left upper lobe pulmonary nodule. EXAM: NUCLEAR MEDICINE PET SKULL BASE TO THIGH TECHNIQUE: 11.3 mCi F-18 FDG was injected intravenously. Full-ring PET imaging was performed from the skull base to thigh after the radiotracer. CT data was obtained and used for attenuation correction and anatomic localization. Fasting blood glucose: 93 mg/dl COMPARISON:  Chest CT of 02/14/2018 FINDINGS: Mediastinal blood pool activity: SUV max 2.6 NECK: No areas of abnormal hypermetabolism. Incidental CT findings: Right carotid  atherosclerosis. No cervical adenopathy. Mucosal thickening in the right maxillary sinus. CHEST: Hypermetabolism corresponding to the spiculated central lingular pulmonary nodule. Example at 1.9 x 2.2 cm and a S.U.V. max of 7.9 on image 45/8. Hypermetabolism corresponding to the enlarged left hilar node. This measures 1.1 cm and a S.U.V. max of 6.8 on image 75/4. Incidental CT findings: Deferred to recent diagnostic CT. Tiny hiatal hernia. Coronary and aortic atherosclerosis. Moderate centrilobular and paraseptal emphysema. ABDOMEN/PELVIS: No abdominopelvic parenchymal or nodal hypermetabolism. Incidental CT findings: Normal adrenal glands. Bilateral renal vascular calcifications. Infrarenal abdominal  aortic aneurysm at maximally 4.2 x 4.2 cm on image 136/4. Status post bilateral iliac stent grafts with borderline aneurysmal dilatation of the left common iliac at 2.0 cm on image 159/4. Mild prostatomegaly. SKELETON: No abnormal marrow activity. Incidental CT findings: Grade 2 L5-S1 anterolisthesis, with bilateral L5 pars defects. IMPRESSION: 1. Central lingular primary bronchogenic carcinoma with ipsilateral hilar nodal metastasis. G2IR4W5 or stage IIB 2. No evidence of extrathoracic hypermetabolic metastasis. 3. Aortic atherosclerosis (ICD10-I70.0), coronary artery atherosclerosis and emphysema (ICD10-J43.9). 4. Infrarenal aortic aneurysm of 4.2 cm. Recommend followup by ultrasound in 1 year. This recommendation follows ACR consensus guidelines: White Paper of the ACR Incidental Findings Committee II on Vascular Findings. J Am Coll Radiol 2013; 10:789-794. Electronically Signed   By: Abigail Miyamoto M.D.   On: 03/03/2018 14:38   Mr Jeri Cos IO Contrast  Result Date: 03/31/2018 CLINICAL DATA:  Non-small cell lung cancer. EXAM: MRI HEAD WITHOUT AND WITH CONTRAST TECHNIQUE: Multiplanar, multiecho pulse sequences of the brain and surrounding structures were obtained without and with intravenous contrast. CONTRAST:  9  mL Gadavist COMPARISON:  None. FINDINGS: Brain: There is no evidence of acute infarct, intracranial hemorrhage, mass, midline shift, or extra-axial fluid collection. Cerebral atrophy is not greater than expected for age. There is a small region of encephalomalacia laterally in the left temporal lobe which may be postischemic or posttraumatic. A small chronic cortical infarct is also noted in the high posterior left frontal lobe. Patchy T2 hyperintensities in the cerebral white matter bilaterally are nonspecific but compatible with moderate chronic small vessel ischemic disease. No abnormal enhancement is identified. Vascular: Major intracranial vascular flow voids are preserved. Skull and upper cervical spine: No suspicious marrow lesion. Sinuses/Orbits: Unremarkable orbits. Paranasal sinuses and mastoid air cells are clear. Other: None. IMPRESSION: 1. No evidence of intracranial metastases. 2. Moderate chronic small vessel ischemic disease. 3. Chronic infarct or posttraumatic encephalomalacia in the left temporal lobe. Electronically Signed   By: Logan Bores M.D.   On: 03/31/2018 11:25   Referred for: Surgical Risk Stratification   Procedure: This patient underwent staged symptom-limited exercise testing using a individualized bike protocol with expired gas analysis metabolic evaluation during exercise.  Demographics  Age: 73 Ht. (in.) 18 Wt. (lb) 212 BMI: 27.2   Predicted Peak VO2: 23.9  Gender: Male Ht (cm) 188 Wt. (kg) 96.2    Results  Pre-Exercise PFTs   FVC 3.38 (70%)     FEV1 2.04 (57%)      FEV1/FVC 60 (80%)      MVV 100 (71%)      Exercise Time:  8:00   Watts: 105  RPE: 18  Reason stopped: patient ended test due to leg fatigue  Additional symptoms: dyspnea (3/10)  Resting HR: 78 Peak HR: 114  (77% age predicted max HR)  BP rest: 112/74 BP peak: 192/66  Peak VO2: 13.5 (56% predicted peak VO2)  VE/VCO2 slope: 39  OUES: 1.50  Peak RER:  1.17  Ventilatory Threshold: 10.9 (46% predicted or 81% measured peak VO2)  Peak RR 29  Peak Ventilation: 62.3  VE/MVV: 62%  PETCO2 at peak: 27  O2pulse: 11  (69% predicted O2pulse)   Interpretation  Notes: Patient gave a very good effort. Pulse-oximetry remained 97-99% for the duration of exercise. Exercise was performed on a cycle ergometer starting at [facility] and increasing by 15W/min.  ECG: Resting ECG in normal sinus rhythm. HR response blunted (77% predicted). There was much artifact with no obvious sustained arrhythmias or ST-T changes. BP  response appropriate.   PFT: Pre-exercise spirometry demonstrates moderate restrictive properties. MVV normal.   CPX: Exercise testing with gas exchange demonstrates a moderate to severely reduced peak VO2 of 13.5 ml/kg/min (56% of the age/gender/weight matched sedentary norms). The RER of 1.17 indicates a maximal effort. When adjusted to the patient's ideal body weight of 193.5 lb (87.8 kg) the peak VO2 is 14.8 ml/kg (ibw)/min (62% of the ibw-adjusted predicted). The VE/VCO2 slope is significantly elevated and indicates excessive dead space ventilation. The oxygen uptake efficiency slope (OUES) is lower-normal. The VO2 at the ventilatory threshold was low at 46% of the predicted peak VO2 and 81% measured PVO2. At peak exercise, the ventilation reached 62% of the measured MVV indicating ventilatory reserve remained. The O2pulse (a surrogate for stroke volume) increased with incremental exercise, reaching blunted peak at 11 ml/beat (69% predicted).    Conclusion: Exercise testing with gas exchange demonstrates moderate to severely reduced functional capacity when compared to matched sedentary norms. There is moderate restriction detected with pre-exercise spirometry. There is a slightly increased risk of complication, given moderate to severely reduced VO2 and elevated VE/VCO2 slope. Patient's body habitus is contributing to exercise intolerance.  There was chronotropic incompetence.    Test, report and preliminary impression by: Landis Martins, MS, ACSM-RCEP 04/14/2018 5:04 PM     I have independently reviewed the above radiology studies  and reviewed the findings with the patient.   Recent Lab Findings: Lab Results  Component Value Date   WBC 8.0 03/27/2018   HGB 15.6 03/27/2018   HCT 47.8 03/27/2018   PLT 189 03/27/2018   GLUCOSE 112 (H) 03/27/2018   CHOL 138 04/08/2018   TRIG 101 04/08/2018   HDL 38 (L) 04/08/2018   LDLCALC 80 04/08/2018   ALT 14 04/08/2018   AST 13 04/08/2018   NA 138 03/27/2018   K 4.2 03/27/2018   CL 103 03/27/2018   CREATININE 1.29 (H) 03/27/2018   BUN 11 03/27/2018   CO2 26 03/27/2018   TSH 1.65 03/23/2015   INR 1.00 03/19/2018   HGBA1C 6.2 (H) 04/23/2017   PFTs show FEV1 2.34 68% of predicted drop 15% with bronchodilator diffusion capacity 17.51 49% predicted   Assessment / Plan:   1/ 2.1 x 2.1 cm mass within the central lingula portion of the left upper lobe, highly suspicious for malignancy, +11 L lymph node on ebus.  Consistent with clinical stage IIb non-small cell carcinoma of the lung. 2/patient with known peripheral vascular disease and at high risk for coronary artery disease.  With the patient's limited functional capacity, dyspnea with low levels of activity, reduced diffusion capacity, and moderate to severe restriction on  VO2 surgical resection and carries increased risk of postoperative pulmonary restrictions and complications.  We will refer the patient back to multidisciplinary thoracic oncology clinic to consider chemotherapy, which replacement was going to require even with surgical resection due to his positive N1 nodes and consider radiation.      Grace Isaac MD      [address] [place] [address] Office [phone]   Beeper [phone]  04/15/2018 12:32 PM

## 2018-04-16 ENCOUNTER — Other Ambulatory Visit: Payer: Self-pay | Admitting: Family Medicine

## 2018-04-16 ENCOUNTER — Telehealth: Payer: Self-pay | Admitting: *Deleted

## 2018-04-16 DIAGNOSIS — I10 Essential (primary) hypertension: Secondary | ICD-10-CM

## 2018-04-16 DIAGNOSIS — C3492 Malignant neoplasm of unspecified part of left bronchus or lung: Secondary | ICD-10-CM

## 2018-04-16 DIAGNOSIS — E785 Hyperlipidemia, unspecified: Secondary | ICD-10-CM

## 2018-04-16 NOTE — Telephone Encounter (Signed)
Requested Prescriptions  Pending Prescriptions Disp Refills  . lisinopril (PRINIVIL,ZESTRIL) 5 MG tablet [Pharmacy Med Name: LISINOPRIL 5 MG TABLET] 90 tablet 0    Sig: TAKE 1 TABLET BY MOUTH EVERY DAY     Cardiovascular:  ACE Inhibitors Failed - 04/16/2018  1:47 AM      Failed - Cr in normal range and within 180 days    Creatinine  Date Value Ref Range Status  03/27/2018 1.29 (H) 0.61 - 1.24 mg/dL Final   Creat  Date Value Ref Range Status  03/23/2015 1.09 0.70 - 1.25 mg/dL Final         Passed - K in normal range and within 180 days    Potassium  Date Value Ref Range Status  03/27/2018 4.2 3.5 - 5.1 mmol/L Final         Passed - Patient is not pregnant      Passed - Last BP in normal range    BP Readings from Last 1 Encounters:  04/15/18 132/68         Passed - Valid encounter within last 6 months    Recent Outpatient Visits          3 months ago Hemoptysis   [facility], [name], MD   3 months ago Chest x-ray abnormality   [facility], [name], MD   4 months ago Hemoptysis   [facility], [name], MD   7 months ago Dry skin dermatitis   [facility], [name], MD   7 months ago Venous stasis dermatitis of right lower extremity   [facility], [name], MD           . metoprolol tartrate (LOPRESSOR) 50 MG tablet [Pharmacy Med Name: METOPROLOL TARTRATE 50 MG TAB] 270 tablet 0    Sig: TAKE 1.5 TABLETS (75 MG TOTAL) BY MOUTH 2 (TWO) TIMES DAILY.     Cardiovascular:  Beta Blockers Passed - 04/16/2018  1:47 AM      Passed - Last BP in normal range    BP Readings from Last 1 Encounters:  04/15/18 132/68         Passed - Last Heart Rate in normal range    Pulse Readings from Last 1 Encounters:  04/15/18 72         Passed - Valid encounter within last 6 months    Recent Outpatient Visits          3 months ago Hemoptysis   [facility], [name],  MD   3 months ago Chest x-ray abnormality   [facility], [name], MD   4 months ago Hemoptysis   [facility], [name], MD   7 months ago Dry skin dermatitis   [facility], [name], MD   7 months ago Venous stasis dermatitis of right lower extremity   [facility], [name], MD

## 2018-04-16 NOTE — Telephone Encounter (Signed)
Oncology Nurse Navigator Documentation  Oncology Nurse Navigator Flowsheets 04/16/2018  Navigator Location [facility]  Navigator Encounter Type Telephone/I received a message from Dr. Servando Snare.  He would like patient to be seen at [facility].  I called patient and set him up to be seen tomorrow with Dr. Julien Nordmann and rad onc.  He verbalized understanding of appt time and place.  Telephone Outgoing Call  Treatment Phase Pre-Tx/Tx Discussion  Barriers/Navigation Needs Education;Coordination of Care  Education Other  Interventions Coordination of Care;Education  Coordination of Care Appts  Education Method Verbal  Acuity Level 1  Time Spent with Patient 15

## 2018-04-17 ENCOUNTER — Ambulatory Visit: Payer: Self-pay | Admitting: Cardiothoracic Surgery

## 2018-04-17 ENCOUNTER — Other Ambulatory Visit: Payer: Self-pay

## 2018-04-17 ENCOUNTER — Telehealth: Payer: Self-pay | Admitting: Internal Medicine

## 2018-04-17 ENCOUNTER — Encounter: Payer: Self-pay | Admitting: Internal Medicine

## 2018-04-17 ENCOUNTER — Ambulatory Visit
Admission: RE | Admit: 2018-04-17 | Discharge: 2018-04-17 | Disposition: A | Discharge status: Home / Self Care | Payer: Medicare Other | Source: Ambulatory Visit | Attending: Radiation Oncology | Admitting: Radiation Oncology

## 2018-04-17 ENCOUNTER — Inpatient Hospital Stay: Payer: Medicare Other | Attending: Internal Medicine

## 2018-04-17 ENCOUNTER — Inpatient Hospital Stay (HOSPITAL_BASED_OUTPATIENT_CLINIC_OR_DEPARTMENT_OTHER): Payer: Medicare Other | Admitting: Internal Medicine

## 2018-04-17 VITALS — BP 133/79 | HR 80 | Temp 98.4°F | Resp 20 | Ht 74.0 in | Wt 214.8 lb

## 2018-04-17 DIAGNOSIS — Z5111 Encounter for antineoplastic chemotherapy: Secondary | ICD-10-CM

## 2018-04-17 DIAGNOSIS — I10 Essential (primary) hypertension: Secondary | ICD-10-CM | POA: Insufficient documentation

## 2018-04-17 DIAGNOSIS — Z7189 Other specified counseling: Secondary | ICD-10-CM

## 2018-04-17 DIAGNOSIS — C3492 Malignant neoplasm of unspecified part of left bronchus or lung: Secondary | ICD-10-CM

## 2018-04-17 DIAGNOSIS — Z87891 Personal history of nicotine dependence: Secondary | ICD-10-CM | POA: Diagnosis not present

## 2018-04-17 DIAGNOSIS — R5383 Other fatigue: Secondary | ICD-10-CM

## 2018-04-17 DIAGNOSIS — C3411 Malignant neoplasm of upper lobe, right bronchus or lung: Secondary | ICD-10-CM

## 2018-04-17 DIAGNOSIS — Z79899 Other long term (current) drug therapy: Secondary | ICD-10-CM

## 2018-04-17 DIAGNOSIS — K219 Gastro-esophageal reflux disease without esophagitis: Secondary | ICD-10-CM | POA: Diagnosis not present

## 2018-04-17 DIAGNOSIS — E785 Hyperlipidemia, unspecified: Secondary | ICD-10-CM | POA: Diagnosis not present

## 2018-04-17 DIAGNOSIS — J439 Emphysema, unspecified: Secondary | ICD-10-CM | POA: Insufficient documentation

## 2018-04-17 DIAGNOSIS — Z7982 Long term (current) use of aspirin: Secondary | ICD-10-CM

## 2018-04-17 DIAGNOSIS — C3412 Malignant neoplasm of upper lobe, left bronchus or lung: Secondary | ICD-10-CM | POA: Diagnosis not present

## 2018-04-17 LAB — CBC WITH DIFFERENTIAL (CANCER CENTER ONLY)
ABS IMMATURE GRANULOCYTES: 0.04 10*3/uL (ref 0.00–0.07)
Basophils Absolute: 0.1 10*3/uL (ref 0.0–0.1)
Basophils Relative: 1 %
Eosinophils Absolute: 0.2 10*3/uL (ref 0.0–0.5)
Eosinophils Relative: 3 %
HCT: 45.1 % (ref 39.0–52.0)
HEMOGLOBIN: 14.7 g/dL (ref 13.0–17.0)
IMMATURE GRANULOCYTES: 1 %
Lymphocytes Relative: 21 %
Lymphs Abs: 1.5 10*3/uL (ref 0.7–4.0)
MCH: 29.8 pg (ref 26.0–34.0)
MCHC: 32.6 g/dL (ref 30.0–36.0)
MCV: 91.5 fL (ref 80.0–100.0)
Monocytes Absolute: 0.7 10*3/uL (ref 0.1–1.0)
Monocytes Relative: 10 %
NEUTROS ABS: 4.8 10*3/uL (ref 1.7–7.7)
NEUTROS PCT: 64 %
Platelet Count: 215 10*3/uL (ref 150–400)
RBC: 4.93 MIL/uL (ref 4.22–5.81)
RDW: 13.6 % (ref 11.5–15.5)
WBC Count: 7.4 10*3/uL (ref 4.0–10.5)
nRBC: 0 % (ref 0.0–0.2)

## 2018-04-17 LAB — CMP (CANCER CENTER ONLY)
ALT: 11 U/L (ref 0–44)
AST: 11 U/L — AB (ref 15–41)
Albumin: 3.8 g/dL (ref 3.5–5.0)
Alkaline Phosphatase: 107 U/L (ref 38–126)
Anion gap: 9 (ref 5–15)
BUN: 10 mg/dL (ref 8–23)
CO2: 24 mmol/L (ref 22–32)
Calcium: 8.7 mg/dL — ABNORMAL LOW (ref 8.9–10.3)
Chloride: 104 mmol/L (ref 98–111)
Creatinine: 1.32 mg/dL — ABNORMAL HIGH (ref 0.61–1.24)
GFR, Est AFR Am: >60 mL/min (ref >60)
GFR, Estimated: 54 mL/min — ABNORMAL LOW (ref >60)
GLUCOSE: 99 mg/dL (ref 70–99)
Potassium: 4.1 mmol/L (ref 3.5–5.1)
Sodium: 137 mmol/L (ref 135–145)
Total Bilirubin: 0.7 mg/dL (ref 0.3–1.2)
Total Protein: 6.8 g/dL (ref 6.5–8.1)

## 2018-04-17 MED ORDER — PROCHLORPERAZINE MALEATE 10 MG PO TABS: 10.0000 mg | ORAL_TABLET | Freq: Four times a day (QID) | ORAL | 0 refills | Status: DC | PRN

## 2018-04-17 NOTE — Progress Notes (Signed)
Radiation Oncology         [phone] ________________________________  Name: Seth [name]        MRN: [id number]  Date of Service: 04/17/2018 DOB: January 12, 1946  GN:OIBBCW, [name], MD  Curt Bears, MD     REFERRING PHYSICIAN: Curt Bears, MD   DIAGNOSIS: The encounter diagnosis was Adenocarcinoma of left lung, stage 2 ([place]).   HISTORY OF PRESENT ILLNESS: Seth [name] is a 73 y.o. male seen at the request of Dr. Julien Nordmann for a newly diagnosed NSCLC, of the left hilum. The patient was seen due to hemoptysis and a CXR on 12/24/17 revealed assymmetric opacity in the right lung. A CT chest without contrast on 02/14/2018 revealed a 21 x 21 mm mass in the central lingula in the left chest and a 1 cm left hilar lymph node was noted no other abnormal nodes were identified. He underwent bronchoscopy on 02/26/2018 which revealed benign features. PET imaging on 03/03/2018 revealed hypermetabolic changes in the lingular lesion with an SUV of 7.9 and the left hilar node measured 1.1 cm and had an SUV of 6.8. His case was discussed in conference and recommendations were for repeat bronchoscopy. He underwent this procedure on 03/19/2018 which revealed NSCLC in the 11L node. He met with Dr. Servando Snare and is not a surgical candidate. Rather than surgery, he comes today discuss options of chemoRT.    PREVIOUS RADIATION THERAPY: No   PAST MEDICAL HISTORY:  Past Medical History:  Diagnosis Date  . AAA (abdominal aortic aneurysm) ([place])   . Allergic rhinitis   . Anxiety   . Back pain   . CAD (coronary artery disease)   . Cataracts, bilateral   . Emphysema, unspecified ([place])     " moderate"  . Essential hypertension, benign   . GERD (gastroesophageal reflux disease)   . Gout   . Headache    migraines  . Heart murmur    as a child only  . Hilar adenopathy   . History of hiatal hernia   . History of sciatica   . Hyperlipidemia   . Lung nodule   . Pneumonia    as child  . Prostate  hypertrophy   . PVD (peripheral vascular disease) ([place])   . Seasonal allergies   . Subclavian steal syndrome of left subclavian artery   . Wears dentures   . Wears dentures        PAST SURGICAL HISTORY: Past Surgical History:  Procedure Laterality Date  . CARDIAC CATHETERIZATION     stent placement  . COLONOSCOPY W/ BIOPSIES AND POLYPECTOMY    . ILIAC ARTERY STENT    . MULTIPLE TOOTH EXTRACTIONS    . VIDEO BRONCHOSCOPY WITH ENDOBRONCHIAL NAVIGATION N/A 02/26/2018   Procedure: VIDEO BRONCHOSCOPY WITH ENDOBRONCHIAL NAVIGATION;  Surgeon: Garner Nash, DO;  Location: [facility];  Service: Thoracic;  Laterality: N/A;  . VIDEO BRONCHOSCOPY WITH ENDOBRONCHIAL NAVIGATION N/A 03/19/2018   Procedure: VIDEO BRONCHOSCOPY WITH ENDOBRONCHIAL NAVIGATION and endobronchial ultrasound;  Surgeon: Garner Nash, DO;  Location: [facility];  Service: Thoracic;  Laterality: N/A;  . VIDEO BRONCHOSCOPY WITH ENDOBRONCHIAL ULTRASOUND N/A 02/26/2018   Procedure: VIDEO BRONCHOSCOPY WITH ENDOBRONCHIAL ULTRASOUND;  Surgeon: Garner Nash, DO;  Location: [place] [facility];  Service: Thoracic;  Laterality: N/A;  . VIDEO BRONCHOSCOPY WITH ENDOBRONCHIAL ULTRASOUND N/A 03/19/2018   Procedure: NAVIGATION BRONCHOSCOPY;  Surgeon: Garner Nash, DO;  Location: [facility];  Service: Thoracic;  Laterality: N/A;     FAMILY HISTORY:  Family  History  Problem Relation Age of Onset  . Hypertension Mother   . Heart disease Mother   . Stroke Mother   . Heart disease Paternal Grandmother   . Diabetes Paternal Grandmother   . Hyperlipidemia Paternal Grandmother   . Diabetes Sister   . Heart disease Sister   . Hyperlipidemia Sister   . Heart disease Brother   . Hyperlipidemia Brother   . Hypertension Brother   . Diabetes Paternal Grandfather   . Heart disease Paternal Grandfather   . Hyperlipidemia Paternal Grandfather   . Heart disease Maternal Uncle        x 2  . Cancer Maternal Grandmother        type unknown, ? lung  . Leukemia  Maternal Uncle      SOCIAL HISTORY:  reports that he quit smoking about 2 years ago. His smoking use included cigarettes. He has a 150.00 pack-year smoking history. He has never used smokeless tobacco. He reports current alcohol use. He reports that he does not use drugs. The patient is married and lives in [place]. 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    ALLERGIES: Biaxin [clarithromycin]; Clarithromycin; Iodinated diagnostic agents; Prednisone; Cortisone; Flomax [tamsulosin hcl]; and Iodine   MEDICATIONS:  Current Outpatient Medications  Medication Sig Dispense Refill  . aspirin EC 81 MG tablet Take 81 mg by mouth daily as needed for mild pain.    Marland Kitchen atorvastatin (LIPITOR) 40 MG tablet Take 1 tablet (40 mg total) by mouth daily. 90 tablet 3  . cetirizine (ZYRTEC) 10 MG tablet Take 10 mg by mouth daily at 12 noon.    . clobetasol cream (TEMOVATE) 0.25 % Apply 1 application topically 2 (two) times daily as needed (rash/skin irritation.).     Marland Kitchen clopidogrel (PLAVIX) 75 MG tablet Take 1 tablet (75 mg total) by mouth daily. (Patient taking differently: Take 75 mg by mouth daily at 3 pm. ) 90 tablet 2  . clotrimazole (LOTRIMIN) 1 % cream Apply 1 application topically 2 (two) times daily. To affected area on groin. Use for 2 weeks, or at least 2 days after resolution of rash (Patient taking differently: Apply 1 application topically 2 (two) times daily as needed (rash/fungal infection.). To affected area on groin. Use for 2 weeks, or at least 2 days after resolution of rash) 30 g 0  . famotidine (PEPCID) 20 MG tablet Take 20 mg by mouth 2 (two) times daily. Noon & in the evening.    Marland Kitchen lisinopril (PRINIVIL,ZESTRIL) 10 MG tablet TAKE 1 TABLET BY MOUTH EVERY DAY 90 tablet 1  . lisinopril (PRINIVIL,ZESTRIL) 5 MG tablet TAKE 1 TABLET BY MOUTH EVERY DAY 90 tablet 0  . metoprolol tartrate (LOPRESSOR) 50 MG tablet TAKE 1.5 TABLETS (75 MG TOTAL) BY MOUTH 2 (TWO) TIMES  DAILY. 270 tablet 0  . prochlorperazine (COMPAZINE) 10 MG tablet Take 1 tablet (10 mg total) by mouth every 6 (six) hours as needed for nausea or vomiting. 30 tablet 0  . Triamcinolone Acetonide (NASACORT AQ NA) Place 1 spray into the nose daily at 2 am. (0300 or 0400)    . triamcinolone cream (KENALOG) 0.1 % Apply 1 application topically 2 (two) times daily as needed. (Patient taking differently: Apply 1 application topically 2 (two) times daily as needed (rash/skin irritation.). ) 30 g 0   Current Facility-Administered Medications  Medication Dose Route Frequency Provider Last Rate Last Dose  . 0.9 %  sodium chloride infusion  500 mL Intravenous  Once Irene Shipper, MD         REVIEW OF SYSTEMS: On review of systems, the patient reports that he is doing well overall. He does have some occasional hemoptysis with his sputum when he coughs. He denies significant amounts of blood when this occurs. He denies any chest pain, shortness of breath,  fevers, chills, night sweats, unintended weight changes. He denies any bowel or bladder disturbances, and denies abdominal pain, nausea or vomiting. He denies any new musculoskeletal or joint aches or pains. A complete review of systems is obtained and is otherwise negative.     PHYSICAL EXAM:  Wt Readings from Last 3 Encounters:  04/17/18 214 lb 12.8 oz (97.4 kg)  04/15/18 215 lb (97.5 kg)  04/08/18 212 lb (96.2 kg)   Temp Readings from Last 3 Encounters:  04/17/18 98.4 F (36.9 C) (Oral)  03/27/18 97.6 F (36.4 C)  03/27/18 97.6 F (36.4 C) (Oral)   BP Readings from Last 3 Encounters:  04/17/18 133/79  04/15/18 132/68  04/01/18 (!) 141/81   Pulse Readings from Last 3 Encounters:  04/17/18 80  04/15/18 72  04/01/18 75    In general this is a well appearing caucasian male in no acute distress. He is alert and oriented x4 and appropriate throughout the examination. HEENT reveals that the patient is normocephalic, atraumatic. EOMs are  intact. Skin is intact without any evidence of gross lesions. Cardiovascular exam reveals a regular rate and rhythm, no clicks rubs or murmurs are auscultated. Chest is clear to auscultation bilaterally. Lymphatic assessment is performed and does not reveal any adenopathy in the cervical, supraclavicular,or axillary chains. Abdomen has active bowel sounds in all quadrants and is intact. The abdomen is soft, non tender, non distended. Lower extremities are negative for pretibial pitting edema, deep calf tenderness, cyanosis or clubbing.   ECOG = 1  0 - Asymptomatic (Fully active, able to carry on all predisease activities without restriction)  1 - Symptomatic but completely ambulatory (Restricted in physically strenuous activity but ambulatory and able to carry out work of a light or sedentary nature. For example, light housework, office work)  2 - Symptomatic, <50% in bed during the day (Ambulatory and capable of all self care but unable to carry out any work activities. Up and about more than 50% of waking hours)  3 - Symptomatic, >50% in bed, but not bedbound (Capable of only limited self-care, confined to bed or chair 50% or more of waking hours)  4 - Bedbound (Completely disabled. Cannot carry on any self-care. Totally confined to bed or chair)  5 - Death   Eustace Pen MM, Creech RH, Tormey DC, et al. [phone]). "Toxicity and response criteria of the [facility]". [place] Oncol. 5 (6): 649-55    LABORATORY DATA:  Lab Results  Component Value Date   WBC 7.4 04/17/2018   HGB 14.7 04/17/2018   HCT 45.1 04/17/2018   MCV 91.5 04/17/2018   PLT 215 04/17/2018   Lab Results  Component Value Date   NA 137 04/17/2018   K 4.1 04/17/2018   CL 104 04/17/2018   CO2 24 04/17/2018   Lab Results  Component Value Date   ALT 11 04/17/2018   AST 11 (L) 04/17/2018   ALKPHOS 107 04/17/2018   BILITOT 0.7 04/17/2018      RADIOGRAPHY: Mr Jeri Cos QI Contrast  Result Date:  03/31/2018 CLINICAL DATA:  Non-small cell lung cancer. EXAM: MRI HEAD WITHOUT AND WITH CONTRAST  TECHNIQUE: Multiplanar, multiecho pulse sequences of the brain and surrounding structures were obtained without and with intravenous contrast. CONTRAST:  9 mL Gadavist COMPARISON:  None. FINDINGS: Brain: There is no evidence of acute infarct, intracranial hemorrhage, mass, midline shift, or extra-axial fluid collection. Cerebral atrophy is not greater than expected for age. There is a small region of encephalomalacia laterally in the left temporal lobe which may be postischemic or posttraumatic. A small chronic cortical infarct is also noted in the high posterior left frontal lobe. Patchy T2 hyperintensities in the cerebral white matter bilaterally are nonspecific but compatible with moderate chronic small vessel ischemic disease. No abnormal enhancement is identified. Vascular: Major intracranial vascular flow voids are preserved. Skull and upper cervical spine: No suspicious marrow lesion. Sinuses/Orbits: Unremarkable orbits. Paranasal sinuses and mastoid air cells are clear. Other: None. IMPRESSION: 1. No evidence of intracranial metastases. 2. Moderate chronic small vessel ischemic disease. 3. Chronic infarct or posttraumatic encephalomalacia in the left temporal lobe. Electronically Signed   By: Logan Bores M.D.   On: 03/31/2018 11:25   Dg Chest Port 1 View  Result Date: 03/19/2018 CLINICAL DATA:  Post bronchoscopy. EXAM: PORTABLE CHEST 1 VIEW COMPARISON:  PET-CT 03/03/2018.  Radiographs 02/26/2018. FINDINGS: 1611 hours. The heart size and mediastinal contours are stable. There is aortic atherosclerosis. There are stable asymmetric interstitial opacities in the left lung, partly obscuring the left perihilar nodules. The right lung is clear. No evidence of pleural effusion or pneumothorax. IMPRESSION: Stable chest post bronchoscopy.  No pneumothorax. These results were called by telephone at the time of  interpretation on 03/19/2018 at 4:34 pm to Richardson Landry in the PACU, who verbally acknowledged these results. Electronically Signed   By: Richardean Sale M.D.   On: 03/19/2018 16:34   Dg C-arm Bronchoscopy  Result Date: 03/19/2018 C-ARM BRONCHOSCOPY: Fluoroscopy was utilized by the requesting physician.  No radiographic interpretation.       IMPRESSION/PLAN: 1. Stage IIB, cT1cN1, NSCLC, not otherwise classified of the left hilum. Dr. Lisbeth Renshaw discusses the pathology findings and reviewed the rationale for chemoRT for patients who are unable to proceed with surgical resection. We discussed the risks, benefits, short, and long term effects of radiotherapy, and the patient is interested in proceeding. Dr. Lisbeth Renshaw discusses the delivery and logistics of radiotherapy and anticipates a course of 6 1/2 weeks of radiotherapy. Written consent is obtained and placed in the chart, a copy was provided to the patient. He will simulate on 04/29/18,and we anticipate starting the week of 05/05/2018. He will notify us if he has any progressive hemoptysis.     The above documentation reflects my direct findings during this shared patient visit. Please see the separate note by Dr. Lisbeth Renshaw on this date for the remainder of the patient's plan of care.    Carola Rhine, PAC

## 2018-04-17 NOTE — Telephone Encounter (Signed)
Gave avs and calendar ° °

## 2018-04-17 NOTE — Progress Notes (Signed)
START ON PATHWAY REGIMEN - Non-Small Cell Lung     Administer weekly:     Paclitaxel      Carboplatin   **Always confirm dose/schedule in your pharmacy ordering system**  Patient Characteristics: Stage IIA/IIB - Unresectable AJCC T Category: T1c Current Disease Status: No Distant Mets or Local Recurrence AJCC N Category: N1 AJCC M Category: M0 AJCC 8 Stage Grouping: IIB Intent of Therapy: Curative Intent, Discussed with Patient

## 2018-04-17 NOTE — Progress Notes (Signed)
[place] [phone]   [phone]  OFFICE PROGRESS NOTE  [name], MD [place] [address]  DIAGNOSIS: Stage IIB (T1c, N1, M0) non-small cell lung cancer favoring adenocarcinoma presented with right upper lobe lung nodule in addition to right hilar lymphadenopathy diagnosed in February 2020.  The patient is not a good surgical candidate for resection.  PRIOR THERAPY: None  CURRENT THERAPY: Concurrent chemoradiation with weekly carboplatin for AUC of 2 and paclitaxel 45 mg/M2.  First dose May 05, 2018.  INTERVAL HISTORY: Seth [name] 73 y.o. male returns to the clinic today for follow-up visit accompanied by his wife.  The patient is feeling fine today with no concerning complaints.  The patient was recently seen by Dr. Servando Snare for evaluation and consideration of surgical resection but unfortunately he is not a good candidate for surgical resection because of the poor pulmonary function and other comorbidities.  He is here today for reevaluation and discussion of other treatment options.  He denied having any chest pain, shortness of breath except with exertion with mild cough and no hemoptysis.  He denied having any fever or chills.  He has no nausea, vomiting, diarrhea or constipation.  He has no headache or visual changes.  MEDICAL HISTORY: Past Medical History:  Diagnosis Date  . AAA (abdominal aortic aneurysm) ([place])   . Allergic rhinitis   . Anxiety   . Back pain   . CAD (coronary artery disease)   . Cataracts, bilateral   . Emphysema, unspecified ([place])     " moderate"  . Essential hypertension, benign   . GERD (gastroesophageal reflux disease)   . Gout   . Headache    migraines  . Heart murmur    as a child only  . Hilar adenopathy   . History of hiatal hernia   . History of sciatica   . Hyperlipidemia   . Lung nodule   . Pneumonia    as child  . Prostate hypertrophy   . PVD (peripheral vascular  disease) ([place])   . Seasonal allergies   . Subclavian steal syndrome of left subclavian artery   . Wears dentures   . Wears dentures     ALLERGIES:  is allergic to biaxin [clarithromycin]; clarithromycin; iodinated diagnostic agents; prednisone; cortisone; flomax [tamsulosin hcl]; and iodine.  MEDICATIONS:  Current Outpatient Medications  Medication Sig Dispense Refill  . aspirin EC 81 MG tablet Take 81 mg by mouth daily as needed for mild pain.    Marland Kitchen atorvastatin (LIPITOR) 40 MG tablet Take 1 tablet (40 mg total) by mouth daily. 90 tablet 3  . cetirizine (ZYRTEC) 10 MG tablet Take 10 mg by mouth daily at 12 noon.    . clobetasol cream (TEMOVATE) 9.02 % Apply 1 application topically 2 (two) times daily as needed (rash/skin irritation.).     Marland Kitchen clopidogrel (PLAVIX) 75 MG tablet Take 1 tablet (75 mg total) by mouth daily. (Patient taking differently: Take 75 mg by mouth daily at 3 pm. ) 90 tablet 2  . clotrimazole (LOTRIMIN) 1 % cream Apply 1 application topically 2 (two) times daily. To affected area on groin. Use for 2 weeks, or at least 2 days after resolution of rash (Patient taking differently: Apply 1 application topically 2 (two) times daily as needed (rash/fungal infection.). To affected area on groin. Use for 2 weeks, or at least 2 days after resolution of rash) 30 g 0  . famotidine (PEPCID)  20 MG tablet Take 20 mg by mouth 2 (two) times daily. Noon & in the evening.    Marland Kitchen ipratropium (ATROVENT) 0.06 % nasal spray Place 1-2 sprays into both nostrils 4 (four) times daily as needed for rhinitis. (Patient taking differently: Place 1-2 sprays into both nostrils 2 (two) times daily. ) 15 mL 1  . lisinopril (PRINIVIL,ZESTRIL) 10 MG tablet TAKE 1 TABLET BY MOUTH EVERY DAY 90 tablet 1  . lisinopril (PRINIVIL,ZESTRIL) 5 MG tablet TAKE 1 TABLET BY MOUTH EVERY DAY 90 tablet 0  . metoprolol tartrate (LOPRESSOR) 50 MG tablet TAKE 1.5 TABLETS (75 MG TOTAL) BY MOUTH 2 (TWO) TIMES DAILY. 270 tablet 0  .  Triamcinolone Acetonide (NASACORT AQ NA) Place 1 spray into the nose daily at 2 am. (0300 or 0400)    . triamcinolone cream (KENALOG) 0.1 % Apply 1 application topically 2 (two) times daily as needed. (Patient taking differently: Apply 1 application topically 2 (two) times daily as needed (rash/skin irritation.). ) 30 g 0   Current Facility-Administered Medications  Medication Dose Route Frequency Provider Last Rate Last Dose  . 0.9 %  sodium chloride infusion  500 mL Intravenous Once Irene Shipper, MD        SURGICAL HISTORY:  Past Surgical History:  Procedure Laterality Date  . CARDIAC CATHETERIZATION     stent placement  . COLONOSCOPY W/ BIOPSIES AND POLYPECTOMY    . ILIAC ARTERY STENT    . MULTIPLE TOOTH EXTRACTIONS    . VIDEO BRONCHOSCOPY WITH ENDOBRONCHIAL NAVIGATION N/A 02/26/2018   Procedure: VIDEO BRONCHOSCOPY WITH ENDOBRONCHIAL NAVIGATION;  Surgeon: Garner Nash, DO;  Location: [facility];  Service: Thoracic;  Laterality: N/A;  . VIDEO BRONCHOSCOPY WITH ENDOBRONCHIAL NAVIGATION N/A 03/19/2018   Procedure: VIDEO BRONCHOSCOPY WITH ENDOBRONCHIAL NAVIGATION and endobronchial ultrasound;  Surgeon: Garner Nash, DO;  Location: [facility];  Service: Thoracic;  Laterality: N/A;  . VIDEO BRONCHOSCOPY WITH ENDOBRONCHIAL ULTRASOUND N/A 02/26/2018   Procedure: VIDEO BRONCHOSCOPY WITH ENDOBRONCHIAL ULTRASOUND;  Surgeon: Garner Nash, DO;  Location: [facility];  Service: Thoracic;  Laterality: N/A;  . VIDEO BRONCHOSCOPY WITH ENDOBRONCHIAL ULTRASOUND N/A 03/19/2018   Procedure: NAVIGATION BRONCHOSCOPY;  Surgeon: Garner Nash, DO;  Location: [facility] [place];  Service: Thoracic;  Laterality: N/A;    REVIEW OF SYSTEMS:  Constitutional: positive for fatigue Eyes: negative Ears, nose, mouth, throat, and face: negative Respiratory: positive for dyspnea on exertion Cardiovascular: negative Gastrointestinal: negative Genitourinary:negative Integument/breast: negative Hematologic/lymphatic: negative  Musculoskeletal:negative Neurological: negative Behavioral/Psych: negative Endocrine: negative Allergic/Immunologic: negative   PHYSICAL EXAMINATION: General appearance: alert, cooperative, fatigued and no distress Head: Normocephalic, without obvious abnormality, atraumatic Neck: no adenopathy, no JVD, supple, symmetrical, trachea midline and thyroid not enlarged, symmetric, no tenderness/mass/nodules Lymph nodes: Cervical, supraclavicular, and axillary nodes normal. Resp: clear to auscultation bilaterally Back: symmetric, no curvature. ROM normal. No CVA tenderness. Cardio: regular rate and rhythm, S1, S2 normal, no murmur, click, rub or gallop GI: soft, non-tender; bowel sounds normal; no masses,  no organomegaly Extremities: extremities normal, atraumatic, no cyanosis or edema Neurologic: Alert and oriented X 3, normal strength and tone. Normal symmetric reflexes. Normal coordination and gait  ECOG PERFORMANCE STATUS: 1 - Symptomatic but completely ambulatory  Blood pressure 133/79, pulse 80, temperature 98.4 F (36.9 C), temperature source Oral, resp. rate 20, height 6\' 2"  (1.88 m), weight 214 lb 12.8 oz (97.4 kg), SpO2 98 %.  LABORATORY DATA: Lab Results  Component Value Date   WBC 7.4 04/17/2018   HGB 14.7 04/17/2018  HCT 45.1 04/17/2018   MCV 91.5 04/17/2018   PLT 215 04/17/2018      Chemistry      Component Value Date/Time   NA 138 03/27/2018 1315   NA 137 06/06/2017 1457   K 4.2 03/27/2018 1315   CL 103 03/27/2018 1315   CO2 26 03/27/2018 1315   BUN 11 03/27/2018 1315   BUN 10 06/06/2017 1457   CREATININE 1.29 (H) 03/27/2018 1315   CREATININE 1.09 03/23/2015 0928      Component Value Date/Time   CALCIUM 8.7 (L) 03/27/2018 1315   ALKPHOS 111 04/08/2018 0806   AST 13 04/08/2018 0806   AST 11 (L) 03/27/2018 1315   ALT 14 04/08/2018 0806   ALT 10 03/27/2018 1315   BILITOT 0.5 04/08/2018 0806   BILITOT 0.6 03/27/2018 1315       RADIOGRAPHIC STUDIES:  Mr Jeri Cos ER Contrast  Result Date: 03/31/2018 CLINICAL DATA:  Non-small cell lung cancer. EXAM: MRI HEAD WITHOUT AND WITH CONTRAST TECHNIQUE: Multiplanar, multiecho pulse sequences of the brain and surrounding structures were obtained without and with intravenous contrast. CONTRAST:  9 mL Gadavist COMPARISON:  None. FINDINGS: Brain: There is no evidence of acute infarct, intracranial hemorrhage, mass, midline shift, or extra-axial fluid collection. Cerebral atrophy is not greater than expected for age. There is a small region of encephalomalacia laterally in the left temporal lobe which may be postischemic or posttraumatic. A small chronic cortical infarct is also noted in the high posterior left frontal lobe. Patchy T2 hyperintensities in the cerebral white matter bilaterally are nonspecific but compatible with moderate chronic small vessel ischemic disease. No abnormal enhancement is identified. Vascular: Major intracranial vascular flow voids are preserved. Skull and upper cervical spine: No suspicious marrow lesion. Sinuses/Orbits: Unremarkable orbits. Paranasal sinuses and mastoid air cells are clear. Other: None. IMPRESSION: 1. No evidence of intracranial metastases. 2. Moderate chronic small vessel ischemic disease. 3. Chronic infarct or posttraumatic encephalomalacia in the left temporal lobe. Electronically Signed   By: Logan Bores M.D.   On: 03/31/2018 11:25   Dg Chest Port 1 View  Result Date: 03/19/2018 CLINICAL DATA:  Post bronchoscopy. EXAM: PORTABLE CHEST 1 VIEW COMPARISON:  PET-CT 03/03/2018.  Radiographs 02/26/2018. FINDINGS: 1611 hours. The heart size and mediastinal contours are stable. There is aortic atherosclerosis. There are stable asymmetric interstitial opacities in the left lung, partly obscuring the left perihilar nodules. The right lung is clear. No evidence of pleural effusion or pneumothorax. IMPRESSION: Stable chest post bronchoscopy.  No pneumothorax. These results were  called by telephone at the time of interpretation on 03/19/2018 at 4:34 pm to Richardson Landry in the PACU, who verbally acknowledged these results. Electronically Signed   By: Richardean Sale M.D.   On: 03/19/2018 16:34   Dg C-arm Bronchoscopy  Result Date: 03/19/2018 C-ARM BRONCHOSCOPY: Fluoroscopy was utilized by the requesting physician.  No radiographic interpretation.    ASSESSMENT AND PLAN: This is a very pleasant 73 years old white male recently diagnosed with unresectable stage IIb non-small cell lung cancer, adenocarcinoma presented with right upper lobe lung nodule in addition to right hilar lymphadenopathy diagnosed in February 2020.  The patient is now a good candidate for surgical resection. I had a lengthy discussion with the patient and his wife today about his current condition and treatment options. I recommended for the patient a course of concurrent chemoradiation with weekly carboplatin for AUC of 2 and paclitaxel 45 mg/M2. I discussed with the patient the adverse effect of this  treatment including but not limited to alopecia, myelosuppression, nausea and vomiting, peripheral neuropathy, liver or renal dysfunction. He is expected to start the first dose of his concurrent chemotherapy on May 05, 2018. The patient will see Dr. Lisbeth Renshaw with radiation oncology later today for evaluation and discussion of the radiotherapy option. He will come back for follow-up visit with the start of the first dose of his treatment. We will arrange for the patient to have a chemotherapy education class before the first dose of his treatment. I will call his pharmacy with prescription for Compazine 10 mg p.o. every 6 hours as needed for nausea. The patient was advised to call immediately if he has any concerning symptoms in the interval. The patient voices understanding of current disease status and treatment options and is in agreement with the current care plan. All questions were answered. The patient knows  to call the clinic with any problems, questions or concerns. We can certainly see the patient much sooner if necessary.  I spent 20 minutes counseling the patient face to face. The total time spent in the appointment was 30 minutes.  Disclaimer: This note was dictated with voice recognition software. Similar sounding words can inadvertently be transcribed and may not be corrected upon review.

## 2018-04-18 DIAGNOSIS — Z5111 Encounter for antineoplastic chemotherapy: Secondary | ICD-10-CM | POA: Insufficient documentation

## 2018-04-18 DIAGNOSIS — Z7189 Other specified counseling: Secondary | ICD-10-CM | POA: Insufficient documentation

## 2018-04-21 ENCOUNTER — Encounter: Payer: Self-pay | Admitting: *Deleted

## 2018-04-21 NOTE — Progress Notes (Signed)
Oncology Nurse Navigator Documentation  Oncology Nurse Navigator Flowsheets 04/21/2018  Navigator Location [facility]  Referral date to RadOnc/MedOnc -  Navigator Encounter Type Clinic/MDC/I spoke with patient and his wife last week at [facility].  I gave and explained information on lung cancer, treatments, and resources to help him understand next steps. His treatment plan is concurrent chemo rad.  He is set up for [facility] on 3/24, chemo ed 3/24, and 1st infusion set for 3/30.    Telephone -  Abnormal Finding Date 02/14/2018  Confirmed Diagnosis Date 03/19/2018  Multidisiplinary Clinic Date 04/17/2018  Treatment Initiated Date 04/29/2018  Patient Visit Type MedOnc  Treatment Phase Pre-Tx/Tx Discussion  Barriers/Navigation Needs Education  Education Understanding Cancer/ Treatment Options;Newly Diagnosed Cancer Education;Other  Interventions Education  Coordination of Care -  Education Method Verbal;Written  Acuity Level 2  Time Spent with Patient 45

## 2018-04-24 ENCOUNTER — Ambulatory Visit: Payer: Medicare Other | Admitting: Cardiothoracic Surgery

## 2018-04-28 ENCOUNTER — Ambulatory Visit (INDEPENDENT_AMBULATORY_CARE_PROVIDER_SITE_OTHER): Payer: Medicare Other | Admitting: Family Medicine

## 2018-04-28 ENCOUNTER — Other Ambulatory Visit: Payer: Self-pay

## 2018-04-28 ENCOUNTER — Ambulatory Visit: Payer: Self-pay | Admitting: *Deleted

## 2018-04-28 ENCOUNTER — Encounter: Payer: Self-pay | Admitting: Family Medicine

## 2018-04-28 ENCOUNTER — Encounter: Payer: Self-pay | Admitting: *Deleted

## 2018-04-28 ENCOUNTER — Ambulatory Visit (INDEPENDENT_AMBULATORY_CARE_PROVIDER_SITE_OTHER): Payer: Medicare Other

## 2018-04-28 ENCOUNTER — Telehealth: Payer: Self-pay | Admitting: Medical Oncology

## 2018-04-28 VITALS — BP 140/68 | HR 90 | Temp 97.8°F | Resp 16 | Ht 74.0 in | Wt 216.4 lb

## 2018-04-28 DIAGNOSIS — I2510 Atherosclerotic heart disease of native coronary artery without angina pectoris: Secondary | ICD-10-CM

## 2018-04-28 DIAGNOSIS — Z7189 Other specified counseling: Secondary | ICD-10-CM

## 2018-04-28 DIAGNOSIS — M79672 Pain in left foot: Secondary | ICD-10-CM

## 2018-04-28 DIAGNOSIS — Z7185 Encounter for immunization safety counseling: Secondary | ICD-10-CM

## 2018-04-28 MED ORDER — COLCHICINE 0.6 MG PO TABS: 0.6000 mg | ORAL_TABLET | Freq: Two times a day (BID) | ORAL | 1 refills | Status: DC

## 2018-04-28 NOTE — Progress Notes (Signed)
Established Patient Office Visit  Subjective:  Patient ID: Seth [name], male    DOB: Aug 23, 1945  Age: 73 y.o. MRN: [id number]  CC:  Chief Complaint  Patient presents with  . swollen left and ankle    mentioned to oncologist last week and was more swollen this morning.  Per pt thought ? gout flare but told by oncologist to go see primary for the swelling    HPI Seth [name] presents for   Onset a week ago  More swollen this morning Last episode of gout was 2 years ago affecting the great toe (podagra) He had no injuries He will start radiation for cancer so wanted to get checked out   He reports that his wife has been cooking more seafood and has been eating healthier than usual. He does not drink beer, eat aged cheeses  Past Medical History:  Diagnosis Date  . AAA (abdominal aortic aneurysm) ([place])   . Allergic rhinitis   . Anxiety   . Back pain   . CAD (coronary artery disease)   . Cataracts, bilateral   . Emphysema, unspecified ([place])     " moderate"  . Essential hypertension, benign   . GERD (gastroesophageal reflux disease)   . Gout   . Headache    migraines  . Heart murmur    as a child only  . Hilar adenopathy   . History of hiatal hernia   . History of sciatica   . Hyperlipidemia   . Lung nodule   . Pneumonia    as child  . Prostate hypertrophy   . PVD (peripheral vascular disease) ([place])   . Seasonal allergies   . Subclavian steal syndrome of left subclavian artery   . Wears dentures   . Wears dentures     Past Surgical History:  Procedure Laterality Date  . CARDIAC CATHETERIZATION     stent placement  . COLONOSCOPY W/ BIOPSIES AND POLYPECTOMY    . ILIAC ARTERY STENT    . MULTIPLE TOOTH EXTRACTIONS    . VIDEO BRONCHOSCOPY WITH ENDOBRONCHIAL NAVIGATION N/A 02/26/2018   Procedure: VIDEO BRONCHOSCOPY WITH ENDOBRONCHIAL NAVIGATION;  Surgeon: Garner Nash, DO;  Location: [place];  Service: Thoracic;  Laterality: N/A;  . VIDEO BRONCHOSCOPY WITH  ENDOBRONCHIAL NAVIGATION N/A 03/19/2018   Procedure: VIDEO BRONCHOSCOPY WITH ENDOBRONCHIAL NAVIGATION and endobronchial ultrasound;  Surgeon: Garner Nash, DO;  Location: [place];  Service: Thoracic;  Laterality: N/A;  . VIDEO BRONCHOSCOPY WITH ENDOBRONCHIAL ULTRASOUND N/A 02/26/2018   Procedure: VIDEO BRONCHOSCOPY WITH ENDOBRONCHIAL ULTRASOUND;  Surgeon: Garner Nash, DO;  Location: [place];  Service: Thoracic;  Laterality: N/A;  . VIDEO BRONCHOSCOPY WITH ENDOBRONCHIAL ULTRASOUND N/A 03/19/2018   Procedure: NAVIGATION BRONCHOSCOPY;  Surgeon: Garner Nash, DO;  Location: [facility];  Service: Thoracic;  Laterality: N/A;    Family History  Problem Relation Age of Onset  . Hypertension Mother   . Heart disease Mother   . Stroke Mother   . Heart disease Paternal Grandmother   . Diabetes Paternal Grandmother   . Hyperlipidemia Paternal Grandmother   . Diabetes Sister   . Heart disease Sister   . Hyperlipidemia Sister   . Heart disease Brother   . Hyperlipidemia Brother   . Hypertension Brother   . Diabetes Paternal Grandfather   . Heart disease Paternal Grandfather   . Hyperlipidemia Paternal Grandfather   . Heart disease Maternal Uncle        x 2  . Cancer Maternal  Grandmother        type unknown, ? lung  . Leukemia Maternal Uncle     Social History   Socioeconomic History  . Marital status: Married    Spouse name: Not on file  . Number of children: 1  . Years of education: Not on file  . Highest education level: Not on file  Occupational History  . Occupation: retired  Scientific laboratory technician  . Financial resource strain: Not hard at all  . Food insecurity:    Worry: Never true    Inability: Never true  . Transportation needs:    Medical: No    Non-medical: No  Tobacco Use  . Smoking status: Former Smoker    Packs/day: 3.00    Years: 50.00    Pack years: 150.00    Types: Cigarettes    Last attempt to quit: 04/05/2016    Years since quitting: 2.0  . Smokeless tobacco: Never  Used  Substance and Sexual Activity  . Alcohol use: Yes    Alcohol/week: 0.0 standard drinks    Comment: rare  . Drug use: No  . Sexual activity: Not on file  Lifestyle  . Physical activity:    Days per week: 0 days    Minutes per session: 0 min  . Stress: Not at all  Relationships  . Social connections:    Talks on phone: More than three times a week    Gets together: More than three times a week    Attends religious service: Never    Active member of club or organization: No    Attends meetings of clubs or organizations: Never    Relationship status: Married  . Intimate partner violence:    Fear of current or ex partner: No    Emotionally abused: No    Physically abused: No    Forced sexual activity: No  Other Topics Concern  . Not on file  Social History Narrative   Married   Exercise: No   Education: GED    Outpatient Medications Prior to Visit  Medication Sig Dispense Refill  . aspirin EC 81 MG tablet Take 81 mg by mouth daily as needed for mild pain.    Marland Kitchen atorvastatin (LIPITOR) 40 MG tablet Take 1 tablet (40 mg total) by mouth daily. 90 tablet 3  . cetirizine (ZYRTEC) 10 MG tablet Take 10 mg by mouth daily at 12 noon.    . clobetasol cream (TEMOVATE) 3.71 % Apply 1 application topically 2 (two) times daily as needed (rash/skin irritation.).     Marland Kitchen clopidogrel (PLAVIX) 75 MG tablet Take 1 tablet (75 mg total) by mouth daily. 90 tablet 2  . clotrimazole (LOTRIMIN) 1 % cream Apply 1 application topically 2 (two) times daily. To affected area on groin. Use for 2 weeks, or at least 2 days after resolution of rash (Patient taking differently: Apply 1 application topically 2 (two) times daily as needed (rash/fungal infection.). To affected area on groin. Use for 2 weeks, or at least 2 days after resolution of rash) 30 g 0  . famotidine (PEPCID) 20 MG tablet Take 20 mg by mouth 2 (two) times daily. Noon & in the evening.    Marland Kitchen lisinopril (PRINIVIL,ZESTRIL) 10 MG tablet TAKE 1  TABLET BY MOUTH EVERY DAY 90 tablet 1  . lisinopril (PRINIVIL,ZESTRIL) 5 MG tablet TAKE 1 TABLET BY MOUTH EVERY DAY 90 tablet 0  . metoprolol tartrate (LOPRESSOR) 50 MG tablet TAKE 1.5 TABLETS (75 MG TOTAL) BY MOUTH 2 (  TWO) TIMES DAILY. 270 tablet 0  . Triamcinolone Acetonide (NASACORT AQ NA) Place 1 spray into the nose daily at 2 am. (0300 or 0400)    . triamcinolone cream (KENALOG) 0.1 % Apply 1 application topically 2 (two) times daily as needed. (Patient taking differently: Apply 1 application topically 2 (two) times daily as needed (rash/skin irritation.). ) 30 g 0  . prochlorperazine (COMPAZINE) 10 MG tablet Take 1 tablet (10 mg total) by mouth every 6 (six) hours as needed for nausea or vomiting. (Patient not taking: Reported on 04/28/2018) 30 tablet 0   Facility-Administered Medications Prior to Visit  Medication Dose Route Frequency Provider Last Rate Last Dose  . 0.9 %  sodium chloride infusion  500 mL Intravenous Once Irene Shipper, MD        Allergies  Allergen Reactions  . Biaxin [Clarithromycin] Shortness Of Breath  . Clarithromycin Shortness Of Breath  . Iodinated Diagnostic Agents Hives  . Prednisone Other (See Comments)    Patient states he turns REALLY RED all over.  . Cortisone Other (See Comments)    Turns red from breast up  . Flomax [Tamsulosin Hcl]     Blurred vision and lower back pain   . Iodine Hives    Dye from nuclear medicine test    ROS Review of Systems Review of Systems  Constitutional: Negative for activity change, appetite change, chills and fever.  HENT: Negative for congestion, nosebleeds, trouble swallowing and voice change.   Respiratory: Negative for cough, shortness of breath and wheezing.   Gastrointestinal: Negative for diarrhea, nausea and vomiting.  Genitourinary: Negative for difficulty urinating, dysuria, flank pain and hematuria.  Musculoskeletal: see hpi Neurological: Negative for dizziness, speech difficulty, light-headedness and  numbness.  See HPI. All other review of systems negative.     Objective:    Physical Exam  BP 140/68 (BP Location: Right Arm, Patient Position: Sitting, Cuff Size: Normal)   Pulse 90   Temp 97.8 F (36.6 C) (Oral)   Resp 16   Ht 6\' 2"  (1.88 m)   Wt 216 lb 6.4 oz (98.2 kg)   SpO2 98%   BMI 27.78 kg/m  Wt Readings from Last 3 Encounters:  04/28/18 216 lb 6.4 oz (98.2 kg)  04/17/18 214 lb 12.8 oz (97.4 kg)  04/15/18 215 lb (97.5 kg)   Physical Exam  Constitutional: Oriented to person, place, and time. Appears well-developed and well-nourished.  HENT:  Head: Normocephalic and atraumatic.  Eyes: Conjunctivae and EOM are normal.  Cardiovascular: Normal rate, regular rhythm, normal heart sounds and intact distal pulses.  No murmur heard. Pulmonary/Chest: Effort normal and breath sounds normal. Neurological: Is alert and oriented to person, place, and time.  Skin: Skin is warm. Capillary refill takes less than 2 seconds.  Psychiatric: Has a normal mood and affect. Behavior is normal. Judgment and thought content normal.   Left ankle with trace edema Left foot with mild rubor and mild tenderness at the base of the 2-5th toes Normal range of motion   EXAM: LEFT FOOT - 2 VIEW  COMPARISON: None.  FINDINGS: There is no evidence of fracture or dislocation. There is mild osteoarthritis of the first MTP joint. There is a small plantar calcaneal spur. Soft tissues are unremarkable.  IMPRESSION: No acute osseous injury of the left foot.   Electronically Signed By: Kathreen Devoid On: 04/28/2018 14:27   Health Maintenance Due  Topic Date Due  . PNA vac Low Risk Adult (1 of 2 - PCV13)  04/26/2010  . TETANUS/TDAP  02/06/2015  . INFLUENZA VACCINE  09/05/2017    There are no preventive care reminders to display for this patient.  Lab Results  Component Value Date   TSH 1.65 03/23/2015   Lab Results  Component Value Date   WBC 7.4 04/17/2018   HGB 14.7  04/17/2018   HCT 45.1 04/17/2018   MCV 91.5 04/17/2018   PLT 215 04/17/2018   Lab Results  Component Value Date   NA 137 04/17/2018   K 4.1 04/17/2018   CO2 24 04/17/2018   GLUCOSE 99 04/17/2018   BUN 10 04/17/2018   CREATININE 1.32 (H) 04/17/2018   BILITOT 0.7 04/17/2018   ALKPHOS 107 04/17/2018   AST 11 (L) 04/17/2018   ALT 11 04/17/2018   PROT 6.8 04/17/2018   ALBUMIN 3.8 04/17/2018   CALCIUM 8.7 (L) 04/17/2018   ANIONGAP 9 04/17/2018   GFR 56.05 (L) 02/20/2018   Lab Results  Component Value Date   CHOL 138 04/08/2018   Lab Results  Component Value Date   HDL 38 (L) 04/08/2018   Lab Results  Component Value Date   LDLCALC 80 04/08/2018   Lab Results  Component Value Date   TRIG 101 04/08/2018   Lab Results  Component Value Date   CHOLHDL 3.6 04/08/2018   Lab Results  Component Value Date   HGBA1C 6.2 (H) 04/23/2017      Assessment & Plan:   Problem List Items Addressed This Visit    None    Visit Diagnoses    Left foot pain    -  Primary   Relevant Medications   colchicine 0.6 MG tablet   Other Relevant Orders   DG Foot 2 Views Left (Completed)   Uric Acid   Basic metabolic panel  concerning for gout Will check uric acid If uric acid is concerning will plan to start colchicine      Vaccine counseling    -  Advised pneumovax23 today, pt initially agreed but was concerned about side effects Discussed cdc guidelines Pt will go home and think about it and let us know      Meds ordered this encounter  Medications  . colchicine 0.6 MG tablet    Sig: Take 1 tablet (0.6 mg total) by mouth 2 (two) times daily.    Dispense:  30 tablet    Refill:  1    Follow-up: No follow-ups on file.    Forrest Moron, MD

## 2018-04-28 NOTE — Telephone Encounter (Signed)
This encounter was created in error - please disregard.

## 2018-04-28 NOTE — Telephone Encounter (Signed)
Message from Vernona Rieger sent at 04/28/2018 12:44 PM EDT   Patient said his left foot has been swollen since last week. He has hx of gout. Please advise   Called pt regarding his foot being swollen. He denies pain, redness or fever. The foot and leg feels tight. Denies seeing any bite areas to the foot. He can wear his shoes.  Poor circulation to feet and nerve damage already to feet.  He called his oncologist and was told to call his pcp. He does have a history of lung cancer and getting ready to start chemo. Appointment scheduled. Routing to flow at PCP.  Reason for Disposition . [1] Swollen foot AND [2] no fever  (Exceptions: localized bump from bunions, calluses, insect bite, sting)  Answer Assessment - Initial Assessment Questions 1. ONSET: "When did the pain start?"      No pain 2. LOCATION: "Where is the pain located?"      Left foot 3. PAIN: "How bad is the pain?"    (Scale 1-10; or mild, moderate, severe)   -  MILD (1-3): doesn't interfere with normal activities    -  MODERATE (4-7): interferes with normal activities (e.g., work or school) or awakens from sleep, limping    -  SEVERE (8-10): excruciating pain, unable to do any normal activities, unable to walk     No pain, just feels tight 4. WORK OR EXERCISE: "Has there been any recent work or exercise that involved this part of the body?"      no 5. CAUSE: "What do you think is causing the foot pain?"     Not sure but think it could be gout 6. OTHER SYMPTOMS: "Do you have any other symptoms?" (e.g., leg pain, rash, fever, numbness)     Red just a little, not pitting, poor circulation and nerve damage, some numbness 7. PREGNANCY: "Is there any chance you are pregnant?" "When was your last menstrual period?"     n/a  Protocols used: FOOT PAIN-A-AH

## 2018-04-28 NOTE — Patient Instructions (Addendum)
I am checking labs for gout.  If they are suggestive  Then you can start the colchicine medication one tablet twice a day until symptoms resolve  Do not take lipitor while taking the colchicine medication   EXAM: LEFT FOOT - 2 VIEW  COMPARISON:  None.  FINDINGS: There is no evidence of fracture or dislocation. There is mild osteoarthritis of the first MTP joint. There is a small plantar calcaneal spur. Soft tissues are unremarkable.  IMPRESSION: No acute osseous injury of the left foot.   Electronically Signed   By: Kathreen Devoid   On: 04/28/2018 14:27    If you have lab work done today you will be contacted with your lab results within the next 2 weeks.  If you have not heard from [place] then please contact us. The fastest way to get your results is to register for My Chart.   IF you received an x-ray today, you will receive an invoice from [facility]. Please contact [facility] at [phone] with questions or concerns regarding your invoice.   IF you received labwork today, you will receive an invoice from [facility]. Please contact [facility] at [phone] with questions or concerns regarding your invoice.   Our billing staff will not be able to assist you with questions regarding bills from these companies.  You will be contacted with the lab results as soon as they are available. The fastest way to get your results is to activate your My Chart account. Instructions are located on the last page of this paperwork. If you have not heard from [place] regarding the results in 2 weeks, please contact this office.

## 2018-04-28 NOTE — Telephone Encounter (Signed)
Pt has OV today 04/28/2018 with PCP to get foot evaluated and possible treatment.

## 2018-04-28 NOTE — Telephone Encounter (Signed)
Left foot swollen since last week. Denies warmth ,but slight redness and tightness. Pt instructed to contact PCP for his foot.

## 2018-04-29 ENCOUNTER — Other Ambulatory Visit: Payer: Self-pay

## 2018-04-29 ENCOUNTER — Ambulatory Visit
Admission: RE | Admit: 2018-04-29 | Discharge: 2018-04-29 | Disposition: A | Discharge status: Home / Self Care | Payer: Medicare Other | Source: Ambulatory Visit | Attending: Radiation Oncology | Admitting: Radiation Oncology

## 2018-04-29 ENCOUNTER — Telehealth: Payer: Self-pay | Admitting: Family Medicine

## 2018-04-29 ENCOUNTER — Inpatient Hospital Stay: Payer: Medicare Other

## 2018-04-29 DIAGNOSIS — Z510 Encounter for antineoplastic radiation therapy: Secondary | ICD-10-CM | POA: Diagnosis not present

## 2018-04-29 DIAGNOSIS — Z79899 Other long term (current) drug therapy: Secondary | ICD-10-CM | POA: Diagnosis not present

## 2018-04-29 DIAGNOSIS — Z87891 Personal history of nicotine dependence: Secondary | ICD-10-CM | POA: Diagnosis not present

## 2018-04-29 DIAGNOSIS — K219 Gastro-esophageal reflux disease without esophagitis: Secondary | ICD-10-CM | POA: Diagnosis not present

## 2018-04-29 DIAGNOSIS — Z7982 Long term (current) use of aspirin: Secondary | ICD-10-CM | POA: Diagnosis not present

## 2018-04-29 DIAGNOSIS — J439 Emphysema, unspecified: Secondary | ICD-10-CM | POA: Insufficient documentation

## 2018-04-29 DIAGNOSIS — I739 Peripheral vascular disease, unspecified: Secondary | ICD-10-CM | POA: Diagnosis not present

## 2018-04-29 DIAGNOSIS — I10 Essential (primary) hypertension: Secondary | ICD-10-CM | POA: Insufficient documentation

## 2018-04-29 DIAGNOSIS — I2510 Atherosclerotic heart disease of native coronary artery without angina pectoris: Secondary | ICD-10-CM | POA: Diagnosis not present

## 2018-04-29 DIAGNOSIS — C3412 Malignant neoplasm of upper lobe, left bronchus or lung: Secondary | ICD-10-CM | POA: Diagnosis not present

## 2018-04-29 DIAGNOSIS — Z7902 Long term (current) use of antithrombotics/antiplatelets: Secondary | ICD-10-CM | POA: Diagnosis not present

## 2018-04-29 DIAGNOSIS — J309 Allergic rhinitis, unspecified: Secondary | ICD-10-CM | POA: Insufficient documentation

## 2018-04-29 DIAGNOSIS — C3402 Malignant neoplasm of left main bronchus: Secondary | ICD-10-CM | POA: Insufficient documentation

## 2018-04-29 LAB — BASIC METABOLIC PANEL
BUN/Creatinine Ratio: 8 — ABNORMAL LOW (ref 10–24)
BUN: 9 mg/dL (ref 8–27)
CO2: 21 mmol/L (ref 20–29)
Calcium: 9.6 mg/dL (ref 8.6–10.2)
Chloride: 103 mmol/L (ref 96–106)
Creatinine, Ser: 1.11 mg/dL (ref 0.76–1.27)
GFR calc Af Amer: 76 mL/min/{1.73_m2} (ref >59)
GFR calc non Af Amer: 66 mL/min/{1.73_m2} (ref >59)
GLUCOSE: 130 mg/dL — AB (ref 65–99)
Potassium: 4.1 mmol/L (ref 3.5–5.2)
Sodium: 140 mmol/L (ref 134–144)

## 2018-04-29 LAB — URIC ACID: Uric Acid: 6.9 mg/dL (ref 3.7–8.6)

## 2018-04-29 NOTE — Telephone Encounter (Signed)
Copied from [place] [id number]. Topic: General - Inquiry >> Apr 29, 2018  4:13 PM Margot Ables wrote: Reason for CRM: pt called to check on lab results

## 2018-04-30 ENCOUNTER — Telehealth: Payer: Self-pay | Admitting: Medical Oncology

## 2018-04-30 NOTE — Telephone Encounter (Signed)
New dx -"gout- can I take my medicine if I am starting chemo on  Monday" . I told him to take his gout medicine as prescribed.

## 2018-04-30 NOTE — Telephone Encounter (Signed)
Please review labs and advise.

## 2018-05-01 ENCOUNTER — Ambulatory Visit: Payer: Self-pay | Admitting: Cardiology

## 2018-05-02 DIAGNOSIS — I2510 Atherosclerotic heart disease of native coronary artery without angina pectoris: Secondary | ICD-10-CM | POA: Diagnosis not present

## 2018-05-02 DIAGNOSIS — I10 Essential (primary) hypertension: Secondary | ICD-10-CM | POA: Diagnosis not present

## 2018-05-02 DIAGNOSIS — C3402 Malignant neoplasm of left main bronchus: Secondary | ICD-10-CM | POA: Diagnosis not present

## 2018-05-02 DIAGNOSIS — C3412 Malignant neoplasm of upper lobe, left bronchus or lung: Secondary | ICD-10-CM | POA: Diagnosis not present

## 2018-05-02 DIAGNOSIS — Z510 Encounter for antineoplastic radiation therapy: Secondary | ICD-10-CM | POA: Diagnosis not present

## 2018-05-02 DIAGNOSIS — J439 Emphysema, unspecified: Secondary | ICD-10-CM | POA: Diagnosis not present

## 2018-05-02 DIAGNOSIS — I739 Peripheral vascular disease, unspecified: Secondary | ICD-10-CM | POA: Diagnosis not present

## 2018-05-05 ENCOUNTER — Ambulatory Visit
Admission: RE | Admit: 2018-05-05 | Discharge: 2018-05-05 | Disposition: A | Discharge status: Home / Self Care | Payer: Medicare Other | Source: Ambulatory Visit | Attending: Radiation Oncology | Admitting: Radiation Oncology

## 2018-05-05 ENCOUNTER — Encounter: Payer: Self-pay | Admitting: Internal Medicine

## 2018-05-05 ENCOUNTER — Other Ambulatory Visit: Payer: Self-pay

## 2018-05-05 ENCOUNTER — Inpatient Hospital Stay: Payer: Medicare Other

## 2018-05-05 ENCOUNTER — Telehealth: Payer: Self-pay | Admitting: Medical Oncology

## 2018-05-05 VITALS — BP 164/77 | HR 71 | Temp 98.1°F | Resp 18

## 2018-05-05 DIAGNOSIS — C3492 Malignant neoplasm of unspecified part of left bronchus or lung: Secondary | ICD-10-CM

## 2018-05-05 DIAGNOSIS — I2510 Atherosclerotic heart disease of native coronary artery without angina pectoris: Secondary | ICD-10-CM | POA: Diagnosis not present

## 2018-05-05 DIAGNOSIS — C3411 Malignant neoplasm of upper lobe, right bronchus or lung: Secondary | ICD-10-CM | POA: Diagnosis not present

## 2018-05-05 DIAGNOSIS — R5383 Other fatigue: Secondary | ICD-10-CM | POA: Diagnosis not present

## 2018-05-05 DIAGNOSIS — E785 Hyperlipidemia, unspecified: Secondary | ICD-10-CM | POA: Diagnosis not present

## 2018-05-05 DIAGNOSIS — J439 Emphysema, unspecified: Secondary | ICD-10-CM | POA: Diagnosis not present

## 2018-05-05 DIAGNOSIS — I739 Peripheral vascular disease, unspecified: Secondary | ICD-10-CM | POA: Diagnosis not present

## 2018-05-05 DIAGNOSIS — K219 Gastro-esophageal reflux disease without esophagitis: Secondary | ICD-10-CM | POA: Diagnosis not present

## 2018-05-05 DIAGNOSIS — I10 Essential (primary) hypertension: Secondary | ICD-10-CM | POA: Diagnosis not present

## 2018-05-05 DIAGNOSIS — C3402 Malignant neoplasm of left main bronchus: Secondary | ICD-10-CM | POA: Diagnosis not present

## 2018-05-05 DIAGNOSIS — Z5111 Encounter for antineoplastic chemotherapy: Secondary | ICD-10-CM | POA: Diagnosis not present

## 2018-05-05 DIAGNOSIS — C3412 Malignant neoplasm of upper lobe, left bronchus or lung: Secondary | ICD-10-CM | POA: Diagnosis not present

## 2018-05-05 DIAGNOSIS — Z510 Encounter for antineoplastic radiation therapy: Secondary | ICD-10-CM | POA: Diagnosis not present

## 2018-05-05 LAB — CBC WITH DIFFERENTIAL (CANCER CENTER ONLY)
Abs Immature Granulocytes: 0.03 10*3/uL (ref 0.00–0.07)
Basophils Absolute: 0.1 10*3/uL (ref 0.0–0.1)
Basophils Relative: 1 %
Eosinophils Absolute: 0.2 10*3/uL (ref 0.0–0.5)
Eosinophils Relative: 3 %
HCT: 44.8 % (ref 39.0–52.0)
Hemoglobin: 15 g/dL (ref 13.0–17.0)
IMMATURE GRANULOCYTES: 0 %
Lymphocytes Relative: 22 %
Lymphs Abs: 1.5 10*3/uL (ref 0.7–4.0)
MCH: 30.3 pg (ref 26.0–34.0)
MCHC: 33.5 g/dL (ref 30.0–36.0)
MCV: 90.5 fL (ref 80.0–100.0)
Monocytes Absolute: 0.4 10*3/uL (ref 0.1–1.0)
Monocytes Relative: 6 %
Neutro Abs: 4.6 10*3/uL (ref 1.7–7.7)
Neutrophils Relative %: 68 %
Platelet Count: 199 10*3/uL (ref 150–400)
RBC: 4.95 MIL/uL (ref 4.22–5.81)
RDW: 13.6 % (ref 11.5–15.5)
WBC Count: 6.8 10*3/uL (ref 4.0–10.5)
nRBC: 0 % (ref 0.0–0.2)

## 2018-05-05 LAB — CMP (CANCER CENTER ONLY)
ALT: 16 U/L (ref 0–44)
AST: 14 U/L — ABNORMAL LOW (ref 15–41)
Albumin: 3.9 g/dL (ref 3.5–5.0)
Alkaline Phosphatase: 109 U/L (ref 38–126)
Anion gap: 10 (ref 5–15)
BUN: 8 mg/dL (ref 8–23)
CO2: 22 mmol/L (ref 22–32)
Calcium: 8.9 mg/dL (ref 8.9–10.3)
Chloride: 107 mmol/L (ref 98–111)
Creatinine: 1.05 mg/dL (ref 0.61–1.24)
GFR, Est AFR Am: >60 mL/min (ref >60)
GFR, Estimated: >60 mL/min (ref >60)
Glucose, Bld: 109 mg/dL — ABNORMAL HIGH (ref 70–99)
Potassium: 3.7 mmol/L (ref 3.5–5.1)
Sodium: 139 mmol/L (ref 135–145)
Total Bilirubin: 0.8 mg/dL (ref 0.3–1.2)
Total Protein: 6.7 g/dL (ref 6.5–8.1)

## 2018-05-05 MED ORDER — FAMOTIDINE IN NACL 20-0.9 MG/50ML-% IV SOLN: 20.0000 mg | Freq: Once | INTRAVENOUS | Status: DC

## 2018-05-05 MED ORDER — SODIUM CHLORIDE 0.9 % IV SOLN
20.0000 mg | Freq: Once | INTRAVENOUS | Status: AC
  Administered 2018-05-05: 20 mg via INTRAVENOUS
  Filled 2018-05-05: qty 2

## 2018-05-05 MED ORDER — DIPHENHYDRAMINE HCL 50 MG/ML IJ SOLN
50.0000 mg | Freq: Once | INTRAMUSCULAR | Status: AC
  Administered 2018-05-05: 50 mg via INTRAVENOUS

## 2018-05-05 MED ORDER — SODIUM CHLORIDE 0.9 % IV SOLN
Freq: Once | INTRAVENOUS | Status: AC
  Administered 2018-05-05: 14:00:00 via INTRAVENOUS
  Filled 2018-05-05: qty 250

## 2018-05-05 MED ORDER — SODIUM CHLORIDE 0.9 % IV SOLN
20.0000 mg | Freq: Once | INTRAVENOUS | Status: AC
  Administered 2018-05-05: 20 mg via INTRAVENOUS
  Filled 2018-05-05: qty 20

## 2018-05-05 MED ORDER — PALONOSETRON HCL INJECTION 0.25 MG/5ML
INTRAVENOUS | Status: AC
  Filled 2018-05-05: qty 5

## 2018-05-05 MED ORDER — DIPHENHYDRAMINE HCL 50 MG/ML IJ SOLN
INTRAMUSCULAR | Status: AC
  Filled 2018-05-05: qty 1

## 2018-05-05 MED ORDER — PALONOSETRON HCL INJECTION 0.25 MG/5ML
0.2500 mg | Freq: Once | INTRAVENOUS | Status: AC
  Administered 2018-05-05: 0.25 mg via INTRAVENOUS

## 2018-05-05 MED ORDER — SODIUM CHLORIDE 0.9 % IV SOLN
45.0000 mg/m2 | Freq: Once | INTRAVENOUS | Status: AC
  Administered 2018-05-05: 102 mg via INTRAVENOUS
  Filled 2018-05-05: qty 17

## 2018-05-05 MED ORDER — SODIUM CHLORIDE 0.9 % IV SOLN
210.0000 mg | Freq: Once | INTRAVENOUS | Status: AC
  Administered 2018-05-05: 210 mg via INTRAVENOUS
  Filled 2018-05-05: qty 21

## 2018-05-05 NOTE — Progress Notes (Signed)
Pt completed first treatment of Taxol, Carboplatin without complaint. All questions answered. AVS given.

## 2018-05-05 NOTE — Patient Instructions (Signed)
[facility] Discharge Instructions for Patients Receiving Chemotherapy  Today you received the following chemotherapy agents Taxol, Carboplatin  To help prevent nausea and vomiting after your treatment, we encourage you to take your nausea medication. DO TAKE ONDANSATRON FOR THREE DAYS.   If you develop nausea and vomiting that is not controlled by your nausea medication, call the clinic.   BELOW ARE SYMPTOMS THAT SHOULD BE REPORTED IMMEDIATELY:  *FEVER GREATER THAN 100.5 F  *CHILLS WITH OR WITHOUT FEVER  NAUSEA AND VOMITING THAT IS NOT CONTROLLED WITH YOUR NAUSEA MEDICATION  *UNUSUAL SHORTNESS OF BREATH  *UNUSUAL BRUISING OR BLEEDING  TENDERNESS IN MOUTH AND THROAT WITH OR WITHOUT PRESENCE OF ULCERS  *URINARY PROBLEMS  *BOWEL PROBLEMS  UNUSUAL RASH Items with * indicate a potential emergency and should be followed up as soon as possible.  Feel free to call the clinic should you have any questions or concerns. The clinic phone number is [phone].  Please show the [facility] at check-in to the Emergency Department and triage nurse.  Paclitaxel injection What is this medicine? PACLITAXEL (PAK li TAX el) is a chemotherapy drug. It targets fast dividing cells, like cancer cells, and causes these cells to die. This medicine is used to treat ovarian cancer, breast cancer, lung cancer, Kaposi's sarcoma, and other cancers. This medicine may be used for other purposes; ask your health care provider or pharmacist if you have questions. COMMON BRAND NAME(S): Onxol, Taxol What should I tell my health care provider before I take this medicine? They need to know if you have any of these conditions: -history of irregular heartbeat -liver disease -low blood counts, like low white cell, platelet, or red cell counts -lung or breathing disease, like asthma -tingling of the fingers or toes, or other nerve disorder -an unusual or allergic reaction to paclitaxel,  alcohol, polyoxyethylated castor oil, other chemotherapy, other medicines, foods, dyes, or preservatives -pregnant or trying to get pregnant -breast-feeding How should I use this medicine? This drug is given as an infusion into a vein. It is administered in a hospital or clinic by a specially trained health care professional. Talk to your pediatrician regarding the use of this medicine in children. Special care may be needed. Overdosage: If you think you have taken too much of this medicine contact a poison control center or emergency room at once. NOTE: This medicine is only for you. Do not share this medicine with others. What if I miss a dose? It is important not to miss your dose. Call your doctor or health care professional if you are unable to keep an appointment. What may interact with this medicine? Do not take this medicine with any of the following medications: -disulfiram -metronidazole This medicine may also interact with the following medications: -antiviral medicines for hepatitis, HIV or AIDS -certain antibiotics like erythromycin and clarithromycin -certain medicines for fungal infections like ketoconazole and itraconazole -certain medicines for seizures like carbamazepine, phenobarbital, phenytoin -gemfibrozil -nefazodone -rifampin -St. John's wort This list may not describe all possible interactions. Give your health care provider a list of all the medicines, herbs, non-prescription drugs, or dietary supplements you use. Also tell them if you smoke, drink alcohol, or use illegal drugs. Some items may interact with your medicine. What should I watch for while using this medicine? Your condition will be monitored carefully while you are receiving this medicine. You will need important blood work done while you are taking this medicine. This medicine can cause serious allergic  reactions. To reduce your risk you will need to take other medicine(s) before treatment with this  medicine. If you experience allergic reactions like skin rash, itching or hives, swelling of the face, lips, or tongue, tell your doctor or health care professional right away. In some cases, you may be given additional medicines to help with side effects. Follow all directions for their use. This drug may make you feel generally unwell. This is not uncommon, as chemotherapy can affect healthy cells as well as cancer cells. Report any side effects. Continue your course of treatment even though you feel ill unless your doctor tells you to stop. Call your doctor or health care professional for advice if you get a fever, chills or sore throat, or other symptoms of a cold or flu. Do not treat yourself. This drug decreases your body's ability to fight infections. Try to avoid being around people who are sick. This medicine may increase your risk to bruise or bleed. Call your doctor or health care professional if you notice any unusual bleeding. Be careful brushing and flossing your teeth or using a toothpick because you may get an infection or bleed more easily. If you have any dental work done, tell your dentist you are receiving this medicine. Avoid taking products that contain aspirin, acetaminophen, ibuprofen, naproxen, or ketoprofen unless instructed by your doctor. These medicines may hide a fever. Do not become pregnant while taking this medicine. Women should inform their doctor if they wish to become pregnant or think they might be pregnant. There is a potential for serious side effects to an unborn child. Talk to your health care professional or pharmacist for more information. Do not breast-feed an infant while taking this medicine. Men are advised not to father a child while receiving this medicine. This product may contain alcohol. Ask your pharmacist or healthcare provider if this medicine contains alcohol. Be sure to tell all healthcare providers you are taking this medicine. Certain medicines,  like metronidazole and disulfiram, can cause an unpleasant reaction when taken with alcohol. The reaction includes flushing, headache, nausea, vomiting, sweating, and increased thirst. The reaction can last from 30 minutes to several hours. What side effects may I notice from receiving this medicine? Side effects that you should report to your doctor or health care professional as soon as possible: -allergic reactions like skin rash, itching or hives, swelling of the face, lips, or tongue -breathing problems -changes in vision -fast, irregular heartbeat -high or low blood pressure -mouth sores -pain, tingling, numbness in the hands or feet -signs of decreased platelets or bleeding - bruising, pinpoint red spots on the skin, black, tarry stools, blood in the urine -signs of decreased red blood cells - unusually weak or tired, feeling faint or lightheaded, falls -signs of infection - fever or chills, cough, sore throat, pain or difficulty passing urine -signs and symptoms of liver injury like dark yellow or brown urine; general ill feeling or flu-like symptoms; light-colored stools; loss of appetite; nausea; right upper belly pain; unusually weak or tired; yellowing of the eyes or skin -swelling of the ankles, feet, hands -unusually slow heartbeat Side effects that usually do not require medical attention (report to your doctor or health care professional if they continue or are bothersome): -diarrhea -hair loss -loss of appetite -muscle or joint pain -nausea, vomiting -pain, redness, or irritation at site where injected -tiredness This list may not describe all possible side effects. Call your doctor for medical advice about side effects. You may  report side effects to FDA at 1-800-FDA-1088. Where should I keep my medicine? This drug is given in a hospital or clinic and will not be stored at home. NOTE: This sheet is a summary. It may not cover all possible information. If you have  questions about this medicine, talk to your doctor, pharmacist, or health care provider.  2019 Elsevier/Gold Standard (2016-09-25 13:14:55) Carboplatin injection What is this medicine? CARBOPLATIN (KAR boe pla tin) is a chemotherapy drug. It targets fast dividing cells, like cancer cells, and causes these cells to die. This medicine is used to treat ovarian cancer and many other cancers. This medicine may be used for other purposes; ask your health care provider or pharmacist if you have questions. COMMON BRAND NAME(S): Paraplatin What should I tell my health care provider before I take this medicine? They need to know if you have any of these conditions: -blood disorders -hearing problems -kidney disease -recent or ongoing radiation therapy -an unusual or allergic reaction to carboplatin, cisplatin, other chemotherapy, other medicines, foods, dyes, or preservatives -pregnant or trying to get pregnant -breast-feeding How should I use this medicine? This drug is usually given as an infusion into a vein. It is administered in a hospital or clinic by a specially trained health care professional. Talk to your pediatrician regarding the use of this medicine in children. Special care may be needed. Overdosage: If you think you have taken too much of this medicine contact a poison control center or emergency room at once. NOTE: This medicine is only for you. Do not share this medicine with others. What if I miss a dose? It is important not to miss a dose. Call your doctor or health care professional if you are unable to keep an appointment. What may interact with this medicine? -medicines for seizures -medicines to increase blood counts like filgrastim, pegfilgrastim, sargramostim -some antibiotics like amikacin, gentamicin, neomycin, streptomycin, tobramycin -vaccines Talk to your doctor or health care professional before taking any of these  medicines: -acetaminophen -aspirin -ibuprofen -ketoprofen -naproxen This list may not describe all possible interactions. Give your health care provider a list of all the medicines, herbs, non-prescription drugs, or dietary supplements you use. Also tell them if you smoke, drink alcohol, or use illegal drugs. Some items may interact with your medicine. What should I watch for while using this medicine? Your condition will be monitored carefully while you are receiving this medicine. You will need important blood work done while you are taking this medicine. This drug may make you feel generally unwell. This is not uncommon, as chemotherapy can affect healthy cells as well as cancer cells. Report any side effects. Continue your course of treatment even though you feel ill unless your doctor tells you to stop. In some cases, you may be given additional medicines to help with side effects. Follow all directions for their use. Call your doctor or health care professional for advice if you get a fever, chills or sore throat, or other symptoms of a cold or flu. Do not treat yourself. This drug decreases your body's ability to fight infections. Try to avoid being around people who are sick. This medicine may increase your risk to bruise or bleed. Call your doctor or health care professional if you notice any unusual bleeding. Be careful brushing and flossing your teeth or using a toothpick because you may get an infection or bleed more easily. If you have any dental work done, tell your dentist you are receiving this medicine. Avoid  taking products that contain aspirin, acetaminophen, ibuprofen, naproxen, or ketoprofen unless instructed by your doctor. These medicines may hide a fever. Do not become pregnant while taking this medicine. Women should inform their doctor if they wish to become pregnant or think they might be pregnant. There is a potential for serious side effects to an unborn child. Talk to  your health care professional or pharmacist for more information. Do not breast-feed an infant while taking this medicine. What side effects may I notice from receiving this medicine? Side effects that you should report to your doctor or health care professional as soon as possible: -allergic reactions like skin rash, itching or hives, swelling of the face, lips, or tongue -signs of infection - fever or chills, cough, sore throat, pain or difficulty passing urine -signs of decreased platelets or bleeding - bruising, pinpoint red spots on the skin, black, tarry stools, nosebleeds -signs of decreased red blood cells - unusually weak or tired, fainting spells, lightheadedness -breathing problems -changes in hearing -changes in vision -chest pain -high blood pressure -low blood counts - This drug may decrease the number of white blood cells, red blood cells and platelets. You may be at increased risk for infections and bleeding. -nausea and vomiting -pain, swelling, redness or irritation at the injection site -pain, tingling, numbness in the hands or feet -problems with balance, talking, walking -trouble passing urine or change in the amount of urine Side effects that usually do not require medical attention (report to your doctor or health care professional if they continue or are bothersome): -hair loss -loss of appetite -metallic taste in the mouth or changes in taste This list may not describe all possible side effects. Call your doctor for medical advice about side effects. You may report side effects to FDA at 1-800-FDA-1088. Where should I keep my medicine? This drug is given in a hospital or clinic and will not be stored at home. NOTE: This sheet is a summary. It may not cover all possible information. If you have questions about this medicine, talk to your doctor, pharmacist, or health care provider.  2019 Elsevier/Gold Standard (2007-04-29 14:38:05)

## 2018-05-05 NOTE — Progress Notes (Signed)
Met w/ pt to introduce myself as his Financial Resource Specialist.  Unfortunately there aren't any foundations offering copay assistance for his Dx and the type of ins he has.  I asked pt if he needed any assistance w/ personal bills or gas cards while in treatment.  He stated he didn't need any help at this time.  I gave him my card in case his situation changes in the future. °

## 2018-05-05 NOTE — Telephone Encounter (Signed)
Watery diarrhea x 4 days since starting Colchicine for gout. Pt feels fine ,afebrile. Per Mohamed diarrhea likely related to colchicine pt instructed to keep appts.  Instructed him to try Imodium.

## 2018-05-06 ENCOUNTER — Telehealth: Payer: Self-pay | Admitting: *Deleted

## 2018-05-06 ENCOUNTER — Other Ambulatory Visit: Payer: Self-pay

## 2018-05-06 ENCOUNTER — Ambulatory Visit
Admission: RE | Admit: 2018-05-06 | Discharge: 2018-05-06 | Disposition: A | Discharge status: Home / Self Care | Payer: Medicare Other | Source: Ambulatory Visit | Attending: Radiation Oncology | Admitting: Radiation Oncology

## 2018-05-06 DIAGNOSIS — Z510 Encounter for antineoplastic radiation therapy: Secondary | ICD-10-CM | POA: Diagnosis not present

## 2018-05-06 DIAGNOSIS — J439 Emphysema, unspecified: Secondary | ICD-10-CM | POA: Diagnosis not present

## 2018-05-06 DIAGNOSIS — C3412 Malignant neoplasm of upper lobe, left bronchus or lung: Secondary | ICD-10-CM | POA: Diagnosis not present

## 2018-05-06 DIAGNOSIS — I739 Peripheral vascular disease, unspecified: Secondary | ICD-10-CM | POA: Diagnosis not present

## 2018-05-06 DIAGNOSIS — C3402 Malignant neoplasm of left main bronchus: Secondary | ICD-10-CM | POA: Diagnosis not present

## 2018-05-06 DIAGNOSIS — I2510 Atherosclerotic heart disease of native coronary artery without angina pectoris: Secondary | ICD-10-CM | POA: Diagnosis not present

## 2018-05-06 DIAGNOSIS — I10 Essential (primary) hypertension: Secondary | ICD-10-CM | POA: Diagnosis not present

## 2018-05-06 NOTE — Telephone Encounter (Signed)
Received call from pt. He states he received his first treatment yesterday and today his face and head are all red. Denies itching, pain fever etc.  He states steroids are listed as an allergy because prednisone, steroid injections etc have always made him turn red within 1-2 days. He wants Dr. Julien Nordmann to be aware of this. Advised that it will subside. Pt currently takes zyrtec and pepcid for allergies-this has not changed the redness he is experincing. He understands to call back if symptoms do not subside

## 2018-05-07 ENCOUNTER — Other Ambulatory Visit: Payer: Self-pay | Admitting: Family Medicine

## 2018-05-07 ENCOUNTER — Ambulatory Visit
Admission: RE | Admit: 2018-05-07 | Discharge: 2018-05-07 | Disposition: A | Discharge status: Home / Self Care | Payer: Medicare Other | Source: Ambulatory Visit | Attending: Radiation Oncology | Admitting: Radiation Oncology

## 2018-05-07 DIAGNOSIS — I2510 Atherosclerotic heart disease of native coronary artery without angina pectoris: Secondary | ICD-10-CM | POA: Insufficient documentation

## 2018-05-07 DIAGNOSIS — Z79899 Other long term (current) drug therapy: Secondary | ICD-10-CM | POA: Diagnosis not present

## 2018-05-07 DIAGNOSIS — I739 Peripheral vascular disease, unspecified: Secondary | ICD-10-CM | POA: Insufficient documentation

## 2018-05-07 DIAGNOSIS — J309 Allergic rhinitis, unspecified: Secondary | ICD-10-CM | POA: Insufficient documentation

## 2018-05-07 DIAGNOSIS — I10 Essential (primary) hypertension: Secondary | ICD-10-CM | POA: Diagnosis not present

## 2018-05-07 DIAGNOSIS — Z87891 Personal history of nicotine dependence: Secondary | ICD-10-CM | POA: Insufficient documentation

## 2018-05-07 DIAGNOSIS — M79672 Pain in left foot: Secondary | ICD-10-CM

## 2018-05-07 DIAGNOSIS — K219 Gastro-esophageal reflux disease without esophagitis: Secondary | ICD-10-CM | POA: Insufficient documentation

## 2018-05-07 DIAGNOSIS — C3402 Malignant neoplasm of left main bronchus: Secondary | ICD-10-CM | POA: Diagnosis not present

## 2018-05-07 DIAGNOSIS — Z510 Encounter for antineoplastic radiation therapy: Secondary | ICD-10-CM | POA: Insufficient documentation

## 2018-05-07 DIAGNOSIS — Z7902 Long term (current) use of antithrombotics/antiplatelets: Secondary | ICD-10-CM | POA: Diagnosis not present

## 2018-05-07 DIAGNOSIS — Z7982 Long term (current) use of aspirin: Secondary | ICD-10-CM | POA: Diagnosis not present

## 2018-05-07 DIAGNOSIS — C3412 Malignant neoplasm of upper lobe, left bronchus or lung: Secondary | ICD-10-CM | POA: Diagnosis not present

## 2018-05-07 DIAGNOSIS — J439 Emphysema, unspecified: Secondary | ICD-10-CM | POA: Insufficient documentation

## 2018-05-07 NOTE — Telephone Encounter (Signed)
Requested Prescriptions  Pending Prescriptions Disp Refills  . colchicine 0.6 MG tablet [Pharmacy Med Name: COLCHICINE 0.6 MG TABLET] 90 tablet 0    Sig: TAKE 1 TABLET BY MOUTH TWICE A DAY     Endocrinology:  Gout Agents Passed - 05/07/2018 12:33 PM      Passed - Uric Acid in normal range and within 360 days    Uric Acid  Date Value Ref Range Status  04/28/2018 6.9 3.7 - 8.6 mg/dL Final    Comment:               Therapeutic target for gout patients: <6.0         Passed - Cr in normal range and within 360 days    Creatinine  Date Value Ref Range Status  05/05/2018 1.05 0.61 - 1.24 mg/dL Final   Creat  Date Value Ref Range Status  03/23/2015 1.09 0.70 - 1.25 mg/dL Final         Passed - Valid encounter within last 12 months    Recent Outpatient Visits          1 week ago Left foot pain   [facility], Arlie Solomons, MD   4 months ago Hemoptysis   [facility], [name], MD   4 months ago Chest x-ray abnormality   [facility], [name], MD   4 months ago Hemoptysis   [facility], [name], MD   8 months ago Dry skin dermatitis   [facility], [name], MD      Future Appointments            In 2 weeks [facility] [place], [facility]

## 2018-05-08 ENCOUNTER — Ambulatory Visit
Admission: RE | Admit: 2018-05-08 | Discharge: 2018-05-08 | Disposition: A | Discharge status: Home / Self Care | Payer: Medicare Other | Source: Ambulatory Visit | Attending: Radiation Oncology | Admitting: Radiation Oncology

## 2018-05-08 ENCOUNTER — Other Ambulatory Visit: Payer: Self-pay

## 2018-05-08 DIAGNOSIS — I2510 Atherosclerotic heart disease of native coronary artery without angina pectoris: Secondary | ICD-10-CM | POA: Diagnosis not present

## 2018-05-08 DIAGNOSIS — I739 Peripheral vascular disease, unspecified: Secondary | ICD-10-CM | POA: Diagnosis not present

## 2018-05-08 DIAGNOSIS — I10 Essential (primary) hypertension: Secondary | ICD-10-CM | POA: Diagnosis not present

## 2018-05-08 DIAGNOSIS — C3402 Malignant neoplasm of left main bronchus: Secondary | ICD-10-CM | POA: Diagnosis not present

## 2018-05-08 DIAGNOSIS — C3412 Malignant neoplasm of upper lobe, left bronchus or lung: Secondary | ICD-10-CM | POA: Diagnosis not present

## 2018-05-08 DIAGNOSIS — J439 Emphysema, unspecified: Secondary | ICD-10-CM | POA: Diagnosis not present

## 2018-05-08 DIAGNOSIS — Z510 Encounter for antineoplastic radiation therapy: Secondary | ICD-10-CM | POA: Diagnosis not present

## 2018-05-09 ENCOUNTER — Other Ambulatory Visit: Payer: Self-pay

## 2018-05-09 ENCOUNTER — Ambulatory Visit
Admission: RE | Admit: 2018-05-09 | Discharge: 2018-05-09 | Disposition: A | Discharge status: Home / Self Care | Payer: Medicare Other | Source: Ambulatory Visit | Attending: Radiation Oncology | Admitting: Radiation Oncology

## 2018-05-09 DIAGNOSIS — I739 Peripheral vascular disease, unspecified: Secondary | ICD-10-CM | POA: Diagnosis not present

## 2018-05-09 DIAGNOSIS — C3402 Malignant neoplasm of left main bronchus: Secondary | ICD-10-CM | POA: Diagnosis not present

## 2018-05-09 DIAGNOSIS — I10 Essential (primary) hypertension: Secondary | ICD-10-CM | POA: Diagnosis not present

## 2018-05-09 DIAGNOSIS — C3412 Malignant neoplasm of upper lobe, left bronchus or lung: Secondary | ICD-10-CM | POA: Diagnosis not present

## 2018-05-09 DIAGNOSIS — J439 Emphysema, unspecified: Secondary | ICD-10-CM | POA: Diagnosis not present

## 2018-05-09 DIAGNOSIS — I2510 Atherosclerotic heart disease of native coronary artery without angina pectoris: Secondary | ICD-10-CM | POA: Diagnosis not present

## 2018-05-09 DIAGNOSIS — R911 Solitary pulmonary nodule: Secondary | ICD-10-CM

## 2018-05-09 DIAGNOSIS — Z510 Encounter for antineoplastic radiation therapy: Secondary | ICD-10-CM | POA: Diagnosis not present

## 2018-05-09 MED ORDER — SONAFINE EX EMUL
1.0000 "application " | Freq: Two times a day (BID) | CUTANEOUS | Status: DC
  Administered 2018-05-09: 1 via TOPICAL

## 2018-05-12 ENCOUNTER — Inpatient Hospital Stay (HOSPITAL_BASED_OUTPATIENT_CLINIC_OR_DEPARTMENT_OTHER): Payer: Medicare Other | Admitting: Physician Assistant

## 2018-05-12 ENCOUNTER — Other Ambulatory Visit: Payer: Self-pay

## 2018-05-12 ENCOUNTER — Inpatient Hospital Stay: Payer: Medicare Other | Attending: Internal Medicine

## 2018-05-12 ENCOUNTER — Encounter: Payer: Self-pay | Admitting: Physician Assistant

## 2018-05-12 ENCOUNTER — Inpatient Hospital Stay: Payer: Medicare Other

## 2018-05-12 ENCOUNTER — Ambulatory Visit
Admission: RE | Admit: 2018-05-12 | Discharge: 2018-05-12 | Disposition: A | Discharge status: Home / Self Care | Payer: Medicare Other | Source: Ambulatory Visit | Attending: Radiation Oncology | Admitting: Radiation Oncology

## 2018-05-12 VITALS — BP 108/67 | HR 70 | Temp 97.8°F | Resp 18 | Ht 74.0 in | Wt 209.9 lb

## 2018-05-12 DIAGNOSIS — K219 Gastro-esophageal reflux disease without esophagitis: Secondary | ICD-10-CM | POA: Insufficient documentation

## 2018-05-12 DIAGNOSIS — R590 Localized enlarged lymph nodes: Secondary | ICD-10-CM | POA: Diagnosis not present

## 2018-05-12 DIAGNOSIS — C3412 Malignant neoplasm of upper lobe, left bronchus or lung: Secondary | ICD-10-CM | POA: Diagnosis not present

## 2018-05-12 DIAGNOSIS — R634 Abnormal weight loss: Secondary | ICD-10-CM | POA: Diagnosis not present

## 2018-05-12 DIAGNOSIS — R197 Diarrhea, unspecified: Secondary | ICD-10-CM

## 2018-05-12 DIAGNOSIS — C3411 Malignant neoplasm of upper lobe, right bronchus or lung: Secondary | ICD-10-CM | POA: Insufficient documentation

## 2018-05-12 DIAGNOSIS — R630 Anorexia: Secondary | ICD-10-CM | POA: Diagnosis not present

## 2018-05-12 DIAGNOSIS — Z888 Allergy status to other drugs, medicaments and biological substances status: Secondary | ICD-10-CM | POA: Insufficient documentation

## 2018-05-12 DIAGNOSIS — E785 Hyperlipidemia, unspecified: Secondary | ICD-10-CM | POA: Diagnosis not present

## 2018-05-12 DIAGNOSIS — C3492 Malignant neoplasm of unspecified part of left bronchus or lung: Secondary | ICD-10-CM

## 2018-05-12 DIAGNOSIS — I10 Essential (primary) hypertension: Secondary | ICD-10-CM

## 2018-05-12 DIAGNOSIS — M25521 Pain in right elbow: Secondary | ICD-10-CM | POA: Insufficient documentation

## 2018-05-12 DIAGNOSIS — C3402 Malignant neoplasm of left main bronchus: Secondary | ICD-10-CM | POA: Diagnosis not present

## 2018-05-12 DIAGNOSIS — R042 Hemoptysis: Secondary | ICD-10-CM

## 2018-05-12 DIAGNOSIS — Z510 Encounter for antineoplastic radiation therapy: Secondary | ICD-10-CM | POA: Diagnosis not present

## 2018-05-12 DIAGNOSIS — Z5111 Encounter for antineoplastic chemotherapy: Secondary | ICD-10-CM

## 2018-05-12 DIAGNOSIS — Z7982 Long term (current) use of aspirin: Secondary | ICD-10-CM

## 2018-05-12 DIAGNOSIS — J439 Emphysema, unspecified: Secondary | ICD-10-CM | POA: Diagnosis not present

## 2018-05-12 DIAGNOSIS — R140 Abdominal distension (gaseous): Secondary | ICD-10-CM | POA: Diagnosis not present

## 2018-05-12 DIAGNOSIS — E875 Hyperkalemia: Secondary | ICD-10-CM

## 2018-05-12 DIAGNOSIS — R232 Flushing: Secondary | ICD-10-CM | POA: Insufficient documentation

## 2018-05-12 DIAGNOSIS — I739 Peripheral vascular disease, unspecified: Secondary | ICD-10-CM | POA: Diagnosis not present

## 2018-05-12 DIAGNOSIS — I2510 Atherosclerotic heart disease of native coronary artery without angina pectoris: Secondary | ICD-10-CM | POA: Diagnosis not present

## 2018-05-12 LAB — CMP (CANCER CENTER ONLY)
ALT: 20 U/L (ref 0–44)
AST: 12 U/L — ABNORMAL LOW (ref 15–41)
Albumin: 3.6 g/dL (ref 3.5–5.0)
Alkaline Phosphatase: 113 U/L (ref 38–126)
Anion gap: 7 (ref 5–15)
BUN: 12 mg/dL (ref 8–23)
CO2: 22 mmol/L (ref 22–32)
Calcium: 8.4 mg/dL — ABNORMAL LOW (ref 8.9–10.3)
Chloride: 106 mmol/L (ref 98–111)
Creatinine: 1.02 mg/dL (ref 0.61–1.24)
GFR, Est AFR Am: >60 mL/min (ref >60)
GFR, Estimated: >60 mL/min (ref >60)
Glucose, Bld: 117 mg/dL — ABNORMAL HIGH (ref 70–99)
Potassium: 3.8 mmol/L (ref 3.5–5.1)
Sodium: 135 mmol/L (ref 135–145)
Total Bilirubin: 0.7 mg/dL (ref 0.3–1.2)
Total Protein: 6.1 g/dL — ABNORMAL LOW (ref 6.5–8.1)

## 2018-05-12 LAB — CBC WITH DIFFERENTIAL (CANCER CENTER ONLY)
Abs Immature Granulocytes: 0.04 10*3/uL (ref 0.00–0.07)
Basophils Absolute: 0.1 10*3/uL (ref 0.0–0.1)
Basophils Relative: 1 %
Eosinophils Absolute: 0.2 10*3/uL (ref 0.0–0.5)
Eosinophils Relative: 4 %
HCT: 43.5 % (ref 39.0–52.0)
Hemoglobin: 14.6 g/dL (ref 13.0–17.0)
Immature Granulocytes: 1 %
Lymphocytes Relative: 22 %
Lymphs Abs: 1.3 10*3/uL (ref 0.7–4.0)
MCH: 30.3 pg (ref 26.0–34.0)
MCHC: 33.6 g/dL (ref 30.0–36.0)
MCV: 90.2 fL (ref 80.0–100.0)
Monocytes Absolute: 0.2 10*3/uL (ref 0.1–1.0)
Monocytes Relative: 4 %
Neutro Abs: 4.1 10*3/uL (ref 1.7–7.7)
Neutrophils Relative %: 68 %
Platelet Count: 192 10*3/uL (ref 150–400)
RBC: 4.82 MIL/uL (ref 4.22–5.81)
RDW: 13.5 % (ref 11.5–15.5)
WBC Count: 5.9 10*3/uL (ref 4.0–10.5)
nRBC: 0 % (ref 0.0–0.2)

## 2018-05-12 MED ORDER — DIPHENHYDRAMINE HCL 50 MG/ML IJ SOLN
INTRAMUSCULAR | Status: AC
  Filled 2018-05-12: qty 1

## 2018-05-12 MED ORDER — DEXAMETHASONE SODIUM PHOSPHATE 10 MG/ML IJ SOLN
10.0000 mg | Freq: Once | INTRAMUSCULAR | Status: AC
  Administered 2018-05-12: 10 mg via INTRAVENOUS

## 2018-05-12 MED ORDER — PALONOSETRON HCL INJECTION 0.25 MG/5ML
INTRAVENOUS | Status: AC
  Filled 2018-05-12: qty 5

## 2018-05-12 MED ORDER — DEXAMETHASONE SODIUM PHOSPHATE 10 MG/ML IJ SOLN
INTRAMUSCULAR | Status: AC
  Filled 2018-05-12: qty 1

## 2018-05-12 MED ORDER — SODIUM CHLORIDE 0.9 % IV SOLN
Freq: Once | INTRAVENOUS | Status: AC
  Administered 2018-05-12: 14:00:00 via INTRAVENOUS
  Filled 2018-05-12: qty 250

## 2018-05-12 MED ORDER — SODIUM CHLORIDE 0.9 % IV SOLN
210.0000 mg | Freq: Once | INTRAVENOUS | Status: AC
  Administered 2018-05-12: 210 mg via INTRAVENOUS
  Filled 2018-05-12: qty 21

## 2018-05-12 MED ORDER — SODIUM CHLORIDE 0.9 % IV SOLN
45.0000 mg/m2 | Freq: Once | INTRAVENOUS | Status: AC
  Administered 2018-05-12: 102 mg via INTRAVENOUS
  Filled 2018-05-12: qty 17

## 2018-05-12 MED ORDER — SODIUM CHLORIDE 0.9 % IV SOLN
20.0000 mg | Freq: Once | INTRAVENOUS | Status: AC
  Administered 2018-05-12: 20 mg via INTRAVENOUS
  Filled 2018-05-12: qty 2

## 2018-05-12 MED ORDER — PALONOSETRON HCL INJECTION 0.25 MG/5ML
0.2500 mg | Freq: Once | INTRAVENOUS | Status: AC
  Administered 2018-05-12: 0.25 mg via INTRAVENOUS

## 2018-05-12 MED ORDER — DIPHENHYDRAMINE HCL 50 MG/ML IJ SOLN
50.0000 mg | Freq: Once | INTRAMUSCULAR | Status: AC
  Administered 2018-05-12: 50 mg via INTRAVENOUS

## 2018-05-12 NOTE — Patient Instructions (Signed)
[name] Discharge Instructions for Patients Receiving Chemotherapy  Today you received the following chemotherapy agents Taxol, Carboplatin  To help prevent nausea and vomiting after your treatment, we encourage you to take your nausea medication. DO TAKE ONDANSATRON FOR THREE DAYS.   If you develop nausea and vomiting that is not controlled by your nausea medication, call the clinic.   BELOW ARE SYMPTOMS THAT SHOULD BE REPORTED IMMEDIATELY:  *FEVER GREATER THAN 100.5 F  *CHILLS WITH OR WITHOUT FEVER  NAUSEA AND VOMITING THAT IS NOT CONTROLLED WITH YOUR NAUSEA MEDICATION  *UNUSUAL SHORTNESS OF BREATH  *UNUSUAL BRUISING OR BLEEDING  TENDERNESS IN MOUTH AND THROAT WITH OR WITHOUT PRESENCE OF ULCERS  *URINARY PROBLEMS  *BOWEL PROBLEMS  UNUSUAL RASH Items with * indicate a potential emergency and should be followed up as soon as possible.  Feel free to call the clinic should you have any questions or concerns. The clinic phone number is [phone].  Please show the [facility] at check-in to the Emergency Department and triage nurse.  Paclitaxel injection What is this medicine? PACLITAXEL (PAK li TAX el) is a chemotherapy drug. It targets fast dividing cells, like cancer cells, and causes these cells to die. This medicine is used to treat ovarian cancer, breast cancer, lung cancer, Kaposi's sarcoma, and other cancers. This medicine may be used for other purposes; ask your health care provider or pharmacist if you have questions. COMMON BRAND NAME(S): Onxol, Taxol What should I tell my health care provider before I take this medicine? They need to know if you have any of these conditions: -history of irregular heartbeat -liver disease -low blood counts, like low white cell, platelet, or red cell counts -lung or breathing disease, like asthma -tingling of the fingers or toes, or other nerve disorder -an unusual or allergic reaction to paclitaxel,  alcohol, polyoxyethylated castor oil, other chemotherapy, other medicines, foods, dyes, or preservatives -pregnant or trying to get pregnant -breast-feeding How should I use this medicine? This drug is given as an infusion into a vein. It is administered in a hospital or clinic by a specially trained health care professional. Talk to your pediatrician regarding the use of this medicine in children. Special care may be needed. Overdosage: If you think you have taken too much of this medicine contact a poison control center or emergency room at once. NOTE: This medicine is only for you. Do not share this medicine with others. What if I miss a dose? It is important not to miss your dose. Call your doctor or health care professional if you are unable to keep an appointment. What may interact with this medicine? Do not take this medicine with any of the following medications: -disulfiram -metronidazole This medicine may also interact with the following medications: -antiviral medicines for hepatitis, HIV or AIDS -certain antibiotics like erythromycin and clarithromycin -certain medicines for fungal infections like ketoconazole and itraconazole -certain medicines for seizures like carbamazepine, phenobarbital, phenytoin -gemfibrozil -nefazodone -rifampin -St. John's wort This list may not describe all possible interactions. Give your health care provider a list of all the medicines, herbs, non-prescription drugs, or dietary supplements you use. Also tell them if you smoke, drink alcohol, or use illegal drugs. Some items may interact with your medicine. What should I watch for while using this medicine? Your condition will be monitored carefully while you are receiving this medicine. You will need important blood work done while you are taking this medicine. This medicine can cause serious allergic  reactions. To reduce your risk you will need to take other medicine(s) before treatment with this  medicine. If you experience allergic reactions like skin rash, itching or hives, swelling of the face, lips, or tongue, tell your doctor or health care professional right away. In some cases, you may be given additional medicines to help with side effects. Follow all directions for their use. This drug may make you feel generally unwell. This is not uncommon, as chemotherapy can affect healthy cells as well as cancer cells. Report any side effects. Continue your course of treatment even though you feel ill unless your doctor tells you to stop. Call your doctor or health care professional for advice if you get a fever, chills or sore throat, or other symptoms of a cold or flu. Do not treat yourself. This drug decreases your body's ability to fight infections. Try to avoid being around people who are sick. This medicine may increase your risk to bruise or bleed. Call your doctor or health care professional if you notice any unusual bleeding. Be careful brushing and flossing your teeth or using a toothpick because you may get an infection or bleed more easily. If you have any dental work done, tell your dentist you are receiving this medicine. Avoid taking products that contain aspirin, acetaminophen, ibuprofen, naproxen, or ketoprofen unless instructed by your doctor. These medicines may hide a fever. Do not become pregnant while taking this medicine. Women should inform their doctor if they wish to become pregnant or think they might be pregnant. There is a potential for serious side effects to an unborn child. Talk to your health care professional or pharmacist for more information. Do not breast-feed an infant while taking this medicine. Men are advised not to father a child while receiving this medicine. This product may contain alcohol. Ask your pharmacist or healthcare provider if this medicine contains alcohol. Be sure to tell all healthcare providers you are taking this medicine. Certain medicines,  like metronidazole and disulfiram, can cause an unpleasant reaction when taken with alcohol. The reaction includes flushing, headache, nausea, vomiting, sweating, and increased thirst. The reaction can last from 30 minutes to several hours. What side effects may I notice from receiving this medicine? Side effects that you should report to your doctor or health care professional as soon as possible: -allergic reactions like skin rash, itching or hives, swelling of the face, lips, or tongue -breathing problems -changes in vision -fast, irregular heartbeat -high or low blood pressure -mouth sores -pain, tingling, numbness in the hands or feet -signs of decreased platelets or bleeding - bruising, pinpoint red spots on the skin, black, tarry stools, blood in the urine -signs of decreased red blood cells - unusually weak or tired, feeling faint or lightheaded, falls -signs of infection - fever or chills, cough, sore throat, pain or difficulty passing urine -signs and symptoms of liver injury like dark yellow or brown urine; general ill feeling or flu-like symptoms; light-colored stools; loss of appetite; nausea; right upper belly pain; unusually weak or tired; yellowing of the eyes or skin -swelling of the ankles, feet, hands -unusually slow heartbeat Side effects that usually do not require medical attention (report to your doctor or health care professional if they continue or are bothersome): -diarrhea -hair loss -loss of appetite -muscle or joint pain -nausea, vomiting -pain, redness, or irritation at site where injected -tiredness This list may not describe all possible side effects. Call your doctor for medical advice about side effects. You may  report side effects to FDA at 1-800-FDA-1088. Where should I keep my medicine? This drug is given in a hospital or clinic and will not be stored at home. NOTE: This sheet is a summary. It may not cover all possible information. If you have  questions about this medicine, talk to your doctor, pharmacist, or health care provider.  2019 [employer] (2016-09-25 13:14:55) Carboplatin injection What is this medicine? CARBOPLATIN (KAR boe pla tin) is a chemotherapy drug. It targets fast dividing cells, like cancer cells, and causes these cells to die. This medicine is used to treat ovarian cancer and many other cancers. This medicine may be used for other purposes; ask your health care provider or pharmacist if you have questions. COMMON BRAND NAME(S): Paraplatin What should I tell my health care provider before I take this medicine? They need to know if you have any of these conditions: -blood disorders -hearing problems -kidney disease -recent or ongoing radiation therapy -an unusual or allergic reaction to carboplatin, cisplatin, other chemotherapy, other medicines, foods, dyes, or preservatives -pregnant or trying to get pregnant -breast-feeding How should I use this medicine? This drug is usually given as an infusion into a vein. It is administered in a hospital or clinic by a specially trained health care professional. Talk to your pediatrician regarding the use of this medicine in children. Special care may be needed. Overdosage: If you think you have taken too much of this medicine contact a poison control center or emergency room at once. NOTE: This medicine is only for you. Do not share this medicine with others. What if I miss a dose? It is important not to miss a dose. Call your doctor or health care professional if you are unable to keep an appointment. What may interact with this medicine? -medicines for seizures -medicines to increase blood counts like filgrastim, pegfilgrastim, sargramostim -some antibiotics like amikacin, gentamicin, neomycin, streptomycin, tobramycin -vaccines Talk to your doctor or health care professional before taking any of these  medicines: -acetaminophen -aspirin -ibuprofen -ketoprofen -naproxen This list may not describe all possible interactions. Give your health care provider a list of all the medicines, herbs, non-prescription drugs, or dietary supplements you use. Also tell them if you smoke, drink alcohol, or use illegal drugs. Some items may interact with your medicine. What should I watch for while using this medicine? Your condition will be monitored carefully while you are receiving this medicine. You will need important blood work done while you are taking this medicine. This drug may make you feel generally unwell. This is not uncommon, as chemotherapy can affect healthy cells as well as cancer cells. Report any side effects. Continue your course of treatment even though you feel ill unless your doctor tells you to stop. In some cases, you may be given additional medicines to help with side effects. Follow all directions for their use. Call your doctor or health care professional for advice if you get a fever, chills or sore throat, or other symptoms of a cold or flu. Do not treat yourself. This drug decreases your body's ability to fight infections. Try to avoid being around people who are sick. This medicine may increase your risk to bruise or bleed. Call your doctor or health care professional if you notice any unusual bleeding. Be careful brushing and flossing your teeth or using a toothpick because you may get an infection or bleed more easily. If you have any dental work done, tell your dentist you are receiving this medicine. Avoid  taking products that contain aspirin, acetaminophen, ibuprofen, naproxen, or ketoprofen unless instructed by your doctor. These medicines may hide a fever. Do not become pregnant while taking this medicine. Women should inform their doctor if they wish to become pregnant or think they might be pregnant. There is a potential for serious side effects to an unborn child. Talk to  your health care professional or pharmacist for more information. Do not breast-feed an infant while taking this medicine. What side effects may I notice from receiving this medicine? Side effects that you should report to your doctor or health care professional as soon as possible: -allergic reactions like skin rash, itching or hives, swelling of the face, lips, or tongue -signs of infection - fever or chills, cough, sore throat, pain or difficulty passing urine -signs of decreased platelets or bleeding - bruising, pinpoint red spots on the skin, black, tarry stools, nosebleeds -signs of decreased red blood cells - unusually weak or tired, fainting spells, lightheadedness -breathing problems -changes in hearing -changes in vision -chest pain -high blood pressure -low blood counts - This drug may decrease the number of white blood cells, red blood cells and platelets. You may be at increased risk for infections and bleeding. -nausea and vomiting -pain, swelling, redness or irritation at the injection site -pain, tingling, numbness in the hands or feet -problems with balance, talking, walking -trouble passing urine or change in the amount of urine Side effects that usually do not require medical attention (report to your doctor or health care professional if they continue or are bothersome): -hair loss -loss of appetite -metallic taste in the mouth or changes in taste This list may not describe all possible side effects. Call your doctor for medical advice about side effects. You may report side effects to FDA at 1-800-FDA-1088. Where should I keep my medicine? This drug is given in a hospital or clinic and will not be stored at home. NOTE: This sheet is a summary. It may not cover all possible information. If you have questions about this medicine, talk to your doctor, pharmacist, or health care provider.  2019 [employer] Standard (2007-04-29 14:38:05)

## 2018-05-12 NOTE — Progress Notes (Signed)
[facility] OFFICE PROGRESS NOTE  [name], MD [place] [address]  DIAGNOSIS: Unresectable stage IIB (T1c, N1, M0) non-small cell lung cancer favoring adenocarcinoma presented with right upper lobe lung nodule in addition to right hilar lymphadenopathy diagnosed in February 2020.  The patient is not a good surgical candidate for resection due to poor pulmonary function.  PRIOR THERAPY: None  CURRENT THERAPY: Concurrent chemoradiation with weekly carboplatin for AUC of 2 and paclitaxel 45 mg/M2.  First dose May 05, 2018. Status post 1 cycle.   INTERVAL HISTORY: Seth [name] 73 y.o. male returns to the clinic for a follow-up visit.  The patient is feeling fair today without any concerning complaints except for facial redness and flushing for approximately 1 day after his chemotherapy due to his 20 mg of decadron pretreatment.  His symptoms lasted approximately 1 day.  He has tried Benadryl and Pepcid at home which has helped alleviate his symptoms.  Additionally, he also endorses a 6 pound weight loss over the last 2 weeks. He states he has lost his appetite. He does not use any nutritional supplementation. He states he would be interested in meeting with our nutritionist regarding their recommendations.  Otherwise he tolerated his first treatment well.  He denied any nausea, vomiting, or constipation.  He has been experiencing some diarrhea recently secondary to his colchicine use for his gout flare up.  We had recommended he take Imodium which has been providing some relief.  He denies any fevers, chills, or night sweats.  He believes his breathing has improved since starting treatment and he denies any chest pain, shortness of breath, or cough.  He does continue to experience some persistent blood tinged sputum.  He denies any headaches or visual changes.  He is here today for evaluation before starting cycle #2.  MEDICAL HISTORY: Past Medical History:   Diagnosis Date  . AAA (abdominal aortic aneurysm) ([place])   . Allergic rhinitis   . Anxiety   . Back pain   . CAD (coronary artery disease)   . Cataracts, bilateral   . Emphysema, unspecified ([place])     " moderate"  . Essential hypertension, benign   . GERD (gastroesophageal reflux disease)   . Gout   . Headache    migraines  . Heart murmur    as a child only  . Hilar adenopathy   . History of hiatal hernia   . History of sciatica   . Hyperlipidemia   . Lung nodule   . Pneumonia    as child  . Prostate hypertrophy   . PVD (peripheral vascular disease) ([place])   . Seasonal allergies   . Subclavian steal syndrome of left subclavian artery   . Wears dentures   . Wears dentures     ALLERGIES:  is allergic to biaxin [clarithromycin]; clarithromycin; iodinated diagnostic agents; prednisone; cortisone; flomax [tamsulosin hcl]; and iodine.  MEDICATIONS:  Current Outpatient Medications  Medication Sig Dispense Refill  . aspirin EC 81 MG tablet Take 81 mg by mouth daily as needed for mild pain.    Marland Kitchen atorvastatin (LIPITOR) 40 MG tablet Take 1 tablet (40 mg total) by mouth daily. 90 tablet 3  . cetirizine (ZYRTEC) 10 MG tablet Take 10 mg by mouth daily at 12 noon.    . clobetasol cream (TEMOVATE) 6.96 % Apply 1 application topically 2 (two) times daily as needed (rash/skin irritation.).     Marland Kitchen clopidogrel (PLAVIX) 75 MG tablet Take 1  tablet (75 mg total) by mouth daily. 90 tablet 2  . clotrimazole (LOTRIMIN) 1 % cream Apply 1 application topically 2 (two) times daily. To affected area on groin. Use for 2 weeks, or at least 2 days after resolution of rash (Patient taking differently: Apply 1 application topically 2 (two) times daily as needed (rash/fungal infection.). To affected area on groin. Use for 2 weeks, or at least 2 days after resolution of rash) 30 g 0  . colchicine 0.6 MG tablet TAKE 1 TABLET BY MOUTH TWICE A DAY 180 tablet 1  . famotidine (PEPCID) 20 MG tablet Take 20 mg by  mouth 2 (two) times daily. Noon & in the evening.    Marland Kitchen lisinopril (PRINIVIL,ZESTRIL) 10 MG tablet TAKE 1 TABLET BY MOUTH EVERY DAY 90 tablet 1  . lisinopril (PRINIVIL,ZESTRIL) 5 MG tablet TAKE 1 TABLET BY MOUTH EVERY DAY 90 tablet 0  . loperamide (IMODIUM) 2 MG capsule Take 4 mg by mouth as needed for diarrhea or loose stools.    . metoprolol tartrate (LOPRESSOR) 50 MG tablet TAKE 1.5 TABLETS (75 MG TOTAL) BY MOUTH 2 (TWO) TIMES DAILY. 270 tablet 0  . Triamcinolone Acetonide (NASACORT AQ NA) Place 1 spray into the nose daily at 2 am. (0300 or 0400)    . triamcinolone cream (KENALOG) 0.1 % Apply 1 application topically 2 (two) times daily as needed. (Patient taking differently: Apply 1 application topically 2 (two) times daily as needed (rash/skin irritation.). ) 30 g 0  . prochlorperazine (COMPAZINE) 10 MG tablet Take 1 tablet (10 mg total) by mouth every 6 (six) hours as needed for nausea or vomiting. (Patient not taking: Reported on 04/28/2018) 30 tablet 0   Current Facility-Administered Medications  Medication Dose Route Frequency Provider Last Rate Last Dose  . 0.9 %  sodium chloride infusion  500 mL Intravenous Once Irene Shipper, MD        SURGICAL HISTORY:  Past Surgical History:  Procedure Laterality Date  . CARDIAC CATHETERIZATION     stent placement  . COLONOSCOPY W/ BIOPSIES AND POLYPECTOMY    . ILIAC ARTERY STENT    . MULTIPLE TOOTH EXTRACTIONS    . VIDEO BRONCHOSCOPY WITH ENDOBRONCHIAL NAVIGATION N/A 02/26/2018   Procedure: VIDEO BRONCHOSCOPY WITH ENDOBRONCHIAL NAVIGATION;  Surgeon: Garner Nash, DO;  Location: [facility];  Service: Thoracic;  Laterality: N/A;  . VIDEO BRONCHOSCOPY WITH ENDOBRONCHIAL NAVIGATION N/A 03/19/2018   Procedure: VIDEO BRONCHOSCOPY WITH ENDOBRONCHIAL NAVIGATION and endobronchial ultrasound;  Surgeon: Garner Nash, DO;  Location: [facility];  Service: Thoracic;  Laterality: N/A;  . VIDEO BRONCHOSCOPY WITH ENDOBRONCHIAL ULTRASOUND N/A 02/26/2018   Procedure:  VIDEO BRONCHOSCOPY WITH ENDOBRONCHIAL ULTRASOUND;  Surgeon: Garner Nash, DO;  Location: [facility];  Service: Thoracic;  Laterality: N/A;  . VIDEO BRONCHOSCOPY WITH ENDOBRONCHIAL ULTRASOUND N/A 03/19/2018   Procedure: NAVIGATION BRONCHOSCOPY;  Surgeon: Garner Nash, DO;  Location: [facility];  Service: Thoracic;  Laterality: N/A;    REVIEW OF SYSTEMS:   Review of Systems  Constitutional: Positive for appetite change and unexpected weight change. Negative for  chills, fatigue, and fever HENT:  Positive for facial swelling/flushing from his decadron pre-treatment. Negative for mouth sores, nosebleeds, sore throat and trouble swallowing.   Eyes: Negative for eye problems and icterus.  Respiratory: Positive for blood tinged sputum. Negative for cough, shortness of breath and wheezing.   Cardiovascular: Negative for chest pain and leg swelling.  Gastrointestinal: Positive for diarrhea. Negative for abdominal pain, constipation, nausea and vomiting.  Genitourinary: Negative for bladder incontinence, difficulty urinating, dysuria, frequency and hematuria.   Musculoskeletal: Negative for back pain, gait problem, neck pain and neck stiffness.  Skin: Negative for itching and rash.  Neurological: Negative for dizziness, extremity weakness, gait problem, headaches, light-headedness and seizures.  Hematological: Negative for adenopathy. Does not bruise/bleed easily.  Psychiatric/Behavioral: Negative for confusion, depression and sleep disturbance. The patient is not nervous/anxious.     PHYSICAL EXAMINATION:  Blood pressure 108/67, pulse 70, temperature 97.8 F (36.6 C), temperature source Oral, resp. rate 18, height 6\' 2"  (1.88 m), weight 209 lb 14.4 oz (95.2 kg), SpO2 99 %.  ECOG PERFORMANCE STATUS: 1 - Symptomatic but completely ambulatory  Physical Exam  Constitutional: Oriented to person, place, and time and well-developed, well-nourished, and in no distress. No distress.  HENT:  Head:  Normocephalic and atraumatic.  Mouth/Throat: Oropharynx is clear and moist. No oropharyngeal exudate.  Eyes: Conjunctivae are normal. Right eye exhibits no discharge. Left eye exhibits no discharge. No scleral icterus.  Neck: Normal range of motion. Neck supple.  Cardiovascular: Normal rate, regular rhythm, normal heart sounds and intact distal pulses.   Pulmonary/Chest: Effort normal and breath sounds normal. No respiratory distress. No wheezes. No rales.  Abdominal: Soft. Bowel sounds are normal. Exhibits no distension and no mass. There is no tenderness.  Musculoskeletal: Normal range of motion. Exhibits no edema.  Lymphadenopathy:    No cervical adenopathy.  Neurological: Alert and oriented to person, place, and time. Exhibits normal muscle tone. Gait normal. Coordination normal.  Skin: Skin is warm and dry. No rash noted. Not diaphoretic. No erythema. No pallor.  Psychiatric: Mood, memory and judgment normal.  Vitals reviewed.  LABORATORY DATA: Lab Results  Component Value Date   WBC 5.9 05/12/2018   HGB 14.6 05/12/2018   HCT 43.5 05/12/2018   MCV 90.2 05/12/2018   PLT 192 05/12/2018      Chemistry      Component Value Date/Time   NA 135 05/12/2018 0806   NA 140 04/28/2018 1647   K 3.8 05/12/2018 0806   CL 106 05/12/2018 0806   CO2 22 05/12/2018 0806   BUN 12 05/12/2018 0806   BUN 9 04/28/2018 1647   CREATININE 1.02 05/12/2018 0806   CREATININE 1.09 03/23/2015 0928      Component Value Date/Time   CALCIUM 8.4 (L) 05/12/2018 0806   ALKPHOS 113 05/12/2018 0806   AST 12 (L) 05/12/2018 0806   ALT 20 05/12/2018 0806   BILITOT 0.7 05/12/2018 0806       RADIOGRAPHIC STUDIES:  Dg Foot 2 Views Left  Result Date: 04/28/2018 CLINICAL DATA:  Left foot pain EXAM: LEFT FOOT - 2 VIEW COMPARISON:  None. FINDINGS: There is no evidence of fracture or dislocation. There is mild osteoarthritis of the first MTP joint. There is a small plantar calcaneal spur. Soft tissues are  unremarkable. IMPRESSION: No acute osseous injury of the left foot. Electronically Signed   By: Kathreen Devoid   On: 04/28/2018 14:27     ASSESSMENT/PLAN:  This is a very pleasant 73 year old Caucasian male who was recently diagnosed with unresectable stage IIb non-small cell lung cancer, adenocarcinoma.  He presented with a right upper lobe lung nodule in addition to right hilar lymphadenopathy.  He was diagnosed in February 2020.  The patient is not a good candidate for surgical resection due to his poor pulmonary function.   The patient is currently undergoing concurrent chemoradiation with carboplatin for an AUC of 2 and paclitaxel 45  mg/m.  He is status post 1 cycle.  The patient tolerated treatment fairly well except for facial flushing secondary  to the decadron pretreatment for approximately 1 day after treatment.   The patient was seen with Dr. Julien Nordmann today.  Labs were reviewed with the patient.  We recommend he proceed with cycle #2 today scheduled.  Per pharmacy recommendation, we will reduce the dose of his decadron to 10 mg.  He will continue taking Benadryl at home if his symptoms persist.   We will see him back in 2 weeks for evaluation before starting cycle #4.   For the patient's diarrhea, we recommend he continue taking Imodium.   For the patient's weight loss and lack of appetite, I have placed a referral to the nutritionist. The patient is interested in their suggestions. In the meantime, the patient was encouraged to eat small frequent meals and to snack in between meals.  The patient was advised to call immediately if he has any concerning symptoms in the interval. The patient voices understanding of current disease status and treatment options and is in agreement with the current care plan. All questions were answered. The patient knows to call the clinic with any problems, questions or concerns. We can certainly see the patient much sooner if necessary  No orders of the  defined types were placed in this encounter.    Seth L Heilingoetter, PA-C 05/12/18  ADDENDUM: Hematology/Oncology Attending: I had a face-to-face encounter with the patient today.  I recommended his care plan.  This is a very pleasant 73 years old white male with unresectable stage IIb non-small cell lung cancer, adenocarcinoma.  The patient is currently undergoing a course of concurrent chemoradiation with weekly carboplatin and paclitaxel status post 1 cycle.  He tolerated the first cycle of his treatment well except for facial flushing and swelling after the treatment from suspicious steroid allergy.  He took Benadryl and improved. I recommended for the patient to proceed with cycle #2 today as scheduled.  I will reduce the dose of Decadron and his premedication to 10 mg instead of 20. The patient was also advised to take Benadryl if needed. We will see him back for follow-up visit in 2 weeks for evaluation before starting cycle #4. The patient was advised to call immediately if he has any concerning symptoms in the interval.  Disclaimer: This note was dictated with voice recognition software. Similar sounding words can inadvertently be transcribed and may be missed upon review. Eilleen Kempf, MD 05/12/18

## 2018-05-13 ENCOUNTER — Ambulatory Visit
Admission: RE | Admit: 2018-05-13 | Discharge: 2018-05-13 | Disposition: A | Discharge status: Home / Self Care | Payer: Medicare Other | Source: Ambulatory Visit | Attending: Radiation Oncology | Admitting: Radiation Oncology

## 2018-05-13 ENCOUNTER — Other Ambulatory Visit: Payer: Self-pay

## 2018-05-13 DIAGNOSIS — I10 Essential (primary) hypertension: Secondary | ICD-10-CM | POA: Diagnosis not present

## 2018-05-13 DIAGNOSIS — I739 Peripheral vascular disease, unspecified: Secondary | ICD-10-CM | POA: Diagnosis not present

## 2018-05-13 DIAGNOSIS — C3412 Malignant neoplasm of upper lobe, left bronchus or lung: Secondary | ICD-10-CM | POA: Diagnosis not present

## 2018-05-13 DIAGNOSIS — Z510 Encounter for antineoplastic radiation therapy: Secondary | ICD-10-CM | POA: Diagnosis not present

## 2018-05-13 DIAGNOSIS — C3402 Malignant neoplasm of left main bronchus: Secondary | ICD-10-CM | POA: Diagnosis not present

## 2018-05-13 DIAGNOSIS — I2510 Atherosclerotic heart disease of native coronary artery without angina pectoris: Secondary | ICD-10-CM | POA: Diagnosis not present

## 2018-05-13 DIAGNOSIS — J439 Emphysema, unspecified: Secondary | ICD-10-CM | POA: Diagnosis not present

## 2018-05-14 ENCOUNTER — Other Ambulatory Visit: Payer: Self-pay

## 2018-05-14 ENCOUNTER — Ambulatory Visit
Admission: RE | Admit: 2018-05-14 | Discharge: 2018-05-14 | Disposition: A | Discharge status: Home / Self Care | Payer: Medicare Other | Source: Ambulatory Visit | Attending: Radiation Oncology | Admitting: Radiation Oncology

## 2018-05-14 DIAGNOSIS — J439 Emphysema, unspecified: Secondary | ICD-10-CM | POA: Diagnosis not present

## 2018-05-14 DIAGNOSIS — I2510 Atherosclerotic heart disease of native coronary artery without angina pectoris: Secondary | ICD-10-CM | POA: Diagnosis not present

## 2018-05-14 DIAGNOSIS — C3402 Malignant neoplasm of left main bronchus: Secondary | ICD-10-CM | POA: Diagnosis not present

## 2018-05-14 DIAGNOSIS — Z510 Encounter for antineoplastic radiation therapy: Secondary | ICD-10-CM | POA: Diagnosis not present

## 2018-05-14 DIAGNOSIS — C3412 Malignant neoplasm of upper lobe, left bronchus or lung: Secondary | ICD-10-CM | POA: Diagnosis not present

## 2018-05-14 DIAGNOSIS — I739 Peripheral vascular disease, unspecified: Secondary | ICD-10-CM | POA: Diagnosis not present

## 2018-05-14 DIAGNOSIS — I10 Essential (primary) hypertension: Secondary | ICD-10-CM | POA: Diagnosis not present

## 2018-05-15 ENCOUNTER — Other Ambulatory Visit: Payer: Self-pay

## 2018-05-15 ENCOUNTER — Ambulatory Visit
Admission: RE | Admit: 2018-05-15 | Discharge: 2018-05-15 | Disposition: A | Discharge status: Home / Self Care | Payer: Medicare Other | Source: Ambulatory Visit | Attending: Radiation Oncology | Admitting: Radiation Oncology

## 2018-05-15 DIAGNOSIS — J439 Emphysema, unspecified: Secondary | ICD-10-CM | POA: Diagnosis not present

## 2018-05-15 DIAGNOSIS — I2510 Atherosclerotic heart disease of native coronary artery without angina pectoris: Secondary | ICD-10-CM | POA: Diagnosis not present

## 2018-05-15 DIAGNOSIS — C3402 Malignant neoplasm of left main bronchus: Secondary | ICD-10-CM | POA: Diagnosis not present

## 2018-05-15 DIAGNOSIS — C3412 Malignant neoplasm of upper lobe, left bronchus or lung: Secondary | ICD-10-CM | POA: Diagnosis not present

## 2018-05-15 DIAGNOSIS — Z510 Encounter for antineoplastic radiation therapy: Secondary | ICD-10-CM | POA: Diagnosis not present

## 2018-05-15 DIAGNOSIS — I739 Peripheral vascular disease, unspecified: Secondary | ICD-10-CM | POA: Diagnosis not present

## 2018-05-15 DIAGNOSIS — I10 Essential (primary) hypertension: Secondary | ICD-10-CM | POA: Diagnosis not present

## 2018-05-16 ENCOUNTER — Telehealth: Payer: Self-pay | Admitting: *Deleted

## 2018-05-16 ENCOUNTER — Ambulatory Visit
Admission: RE | Admit: 2018-05-16 | Discharge: 2018-05-16 | Disposition: A | Discharge status: Home / Self Care | Payer: Medicare Other | Source: Ambulatory Visit | Attending: Radiation Oncology | Admitting: Radiation Oncology

## 2018-05-16 ENCOUNTER — Other Ambulatory Visit: Payer: Self-pay

## 2018-05-16 DIAGNOSIS — J439 Emphysema, unspecified: Secondary | ICD-10-CM | POA: Diagnosis not present

## 2018-05-16 DIAGNOSIS — C3412 Malignant neoplasm of upper lobe, left bronchus or lung: Secondary | ICD-10-CM | POA: Diagnosis not present

## 2018-05-16 DIAGNOSIS — I739 Peripheral vascular disease, unspecified: Secondary | ICD-10-CM | POA: Diagnosis not present

## 2018-05-16 DIAGNOSIS — I2510 Atherosclerotic heart disease of native coronary artery without angina pectoris: Secondary | ICD-10-CM | POA: Diagnosis not present

## 2018-05-16 DIAGNOSIS — Z510 Encounter for antineoplastic radiation therapy: Secondary | ICD-10-CM | POA: Diagnosis not present

## 2018-05-16 DIAGNOSIS — I10 Essential (primary) hypertension: Secondary | ICD-10-CM | POA: Diagnosis not present

## 2018-05-16 DIAGNOSIS — C3402 Malignant neoplasm of left main bronchus: Secondary | ICD-10-CM | POA: Diagnosis not present

## 2018-05-16 NOTE — Telephone Encounter (Signed)
Received vm call from pt stating that he had infusion on Monday & tues he had reaction to cortisone & he reports taking benadryl.  Called pt & verified symptoms.  He states that he was red in face next day & face hurt a little & was swollen.  He denies any breathing issues & symptoms are gone now. FYI to Dr Mohamed/Pod RN

## 2018-05-19 ENCOUNTER — Ambulatory Visit: Payer: Medicare Other

## 2018-05-19 ENCOUNTER — Other Ambulatory Visit: Payer: Self-pay

## 2018-05-19 ENCOUNTER — Inpatient Hospital Stay: Payer: Medicare Other

## 2018-05-19 ENCOUNTER — Ambulatory Visit
Admission: RE | Admit: 2018-05-19 | Discharge: 2018-05-19 | Disposition: A | Discharge status: Home / Self Care | Payer: Medicare Other | Source: Ambulatory Visit | Attending: Radiation Oncology | Admitting: Radiation Oncology

## 2018-05-19 VITALS — BP 150/79 | HR 71 | Temp 98.2°F | Resp 18

## 2018-05-19 DIAGNOSIS — C3402 Malignant neoplasm of left main bronchus: Secondary | ICD-10-CM | POA: Diagnosis not present

## 2018-05-19 DIAGNOSIS — C3492 Malignant neoplasm of unspecified part of left bronchus or lung: Secondary | ICD-10-CM

## 2018-05-19 DIAGNOSIS — I2510 Atherosclerotic heart disease of native coronary artery without angina pectoris: Secondary | ICD-10-CM | POA: Diagnosis not present

## 2018-05-19 DIAGNOSIS — J439 Emphysema, unspecified: Secondary | ICD-10-CM | POA: Diagnosis not present

## 2018-05-19 DIAGNOSIS — R232 Flushing: Secondary | ICD-10-CM | POA: Diagnosis not present

## 2018-05-19 DIAGNOSIS — R197 Diarrhea, unspecified: Secondary | ICD-10-CM | POA: Diagnosis not present

## 2018-05-19 DIAGNOSIS — I739 Peripheral vascular disease, unspecified: Secondary | ICD-10-CM | POA: Diagnosis not present

## 2018-05-19 DIAGNOSIS — C3411 Malignant neoplasm of upper lobe, right bronchus or lung: Secondary | ICD-10-CM | POA: Diagnosis not present

## 2018-05-19 DIAGNOSIS — I10 Essential (primary) hypertension: Secondary | ICD-10-CM | POA: Diagnosis not present

## 2018-05-19 DIAGNOSIS — M25521 Pain in right elbow: Secondary | ICD-10-CM | POA: Diagnosis not present

## 2018-05-19 DIAGNOSIS — Z510 Encounter for antineoplastic radiation therapy: Secondary | ICD-10-CM | POA: Diagnosis not present

## 2018-05-19 DIAGNOSIS — C3412 Malignant neoplasm of upper lobe, left bronchus or lung: Secondary | ICD-10-CM | POA: Diagnosis not present

## 2018-05-19 DIAGNOSIS — R634 Abnormal weight loss: Secondary | ICD-10-CM | POA: Diagnosis not present

## 2018-05-19 DIAGNOSIS — Z5111 Encounter for antineoplastic chemotherapy: Secondary | ICD-10-CM | POA: Diagnosis not present

## 2018-05-19 LAB — CMP (CANCER CENTER ONLY)
ALT: 17 U/L (ref 0–44)
AST: 10 U/L — ABNORMAL LOW (ref 15–41)
Albumin: 3.5 g/dL (ref 3.5–5.0)
Alkaline Phosphatase: 109 U/L (ref 38–126)
Anion gap: 8 (ref 5–15)
BUN: 11 mg/dL (ref 8–23)
CO2: 22 mmol/L (ref 22–32)
Calcium: 8.5 mg/dL — ABNORMAL LOW (ref 8.9–10.3)
Chloride: 106 mmol/L (ref 98–111)
Creatinine: 0.98 mg/dL (ref 0.61–1.24)
GFR, Est AFR Am: >60 mL/min (ref >60)
GFR, Estimated: >60 mL/min (ref >60)
Glucose, Bld: 122 mg/dL — ABNORMAL HIGH (ref 70–99)
Potassium: 3.9 mmol/L (ref 3.5–5.1)
Sodium: 136 mmol/L (ref 135–145)
Total Bilirubin: 0.6 mg/dL (ref 0.3–1.2)
Total Protein: 6.1 g/dL — ABNORMAL LOW (ref 6.5–8.1)

## 2018-05-19 LAB — CBC WITH DIFFERENTIAL (CANCER CENTER ONLY)
Abs Immature Granulocytes: 0.03 10*3/uL (ref 0.00–0.07)
Basophils Absolute: 0.1 10*3/uL (ref 0.0–0.1)
Basophils Relative: 1 %
Eosinophils Absolute: 0.1 10*3/uL (ref 0.0–0.5)
Eosinophils Relative: 2 %
HCT: 42.1 % (ref 39.0–52.0)
Hemoglobin: 13.8 g/dL (ref 13.0–17.0)
Immature Granulocytes: 1 %
Lymphocytes Relative: 23 %
Lymphs Abs: 1 10*3/uL (ref 0.7–4.0)
MCH: 30.3 pg (ref 26.0–34.0)
MCHC: 32.8 g/dL (ref 30.0–36.0)
MCV: 92.5 fL (ref 80.0–100.0)
Monocytes Absolute: 0.3 10*3/uL (ref 0.1–1.0)
Monocytes Relative: 6 %
Neutro Abs: 3 10*3/uL (ref 1.7–7.7)
Neutrophils Relative %: 67 %
Platelet Count: 192 10*3/uL (ref 150–400)
RBC: 4.55 MIL/uL (ref 4.22–5.81)
RDW: 13.7 % (ref 11.5–15.5)
WBC Count: 4.5 10*3/uL (ref 4.0–10.5)
nRBC: 0 % (ref 0.0–0.2)

## 2018-05-19 MED ORDER — DIPHENHYDRAMINE HCL 50 MG/ML IJ SOLN
50.0000 mg | Freq: Once | INTRAMUSCULAR | Status: AC
  Administered 2018-05-19: 50 mg via INTRAVENOUS

## 2018-05-19 MED ORDER — SODIUM CHLORIDE 0.9 % IV SOLN
45.0000 mg/m2 | Freq: Once | INTRAVENOUS | Status: AC
  Administered 2018-05-19: 102 mg via INTRAVENOUS
  Filled 2018-05-19: qty 17

## 2018-05-19 MED ORDER — SODIUM CHLORIDE 0.9 % IV SOLN
189.4000 mg | Freq: Once | INTRAVENOUS | Status: AC
  Administered 2018-05-19: 12:00:00 190 mg via INTRAVENOUS
  Filled 2018-05-19: qty 19

## 2018-05-19 MED ORDER — SODIUM CHLORIDE 0.9 % IV SOLN
Freq: Once | INTRAVENOUS | Status: AC
  Administered 2018-05-19: 09:00:00 via INTRAVENOUS
  Filled 2018-05-19: qty 250

## 2018-05-19 MED ORDER — PALONOSETRON HCL INJECTION 0.25 MG/5ML
0.2500 mg | Freq: Once | INTRAVENOUS | Status: AC
  Administered 2018-05-19: 10:00:00 0.25 mg via INTRAVENOUS

## 2018-05-19 MED ORDER — DEXAMETHASONE SODIUM PHOSPHATE 10 MG/ML IJ SOLN
INTRAMUSCULAR | Status: AC
  Filled 2018-05-19: qty 1

## 2018-05-19 MED ORDER — PALONOSETRON HCL INJECTION 0.25 MG/5ML
INTRAVENOUS | Status: AC
  Filled 2018-05-19: qty 5

## 2018-05-19 MED ORDER — DIPHENHYDRAMINE HCL 50 MG/ML IJ SOLN
INTRAMUSCULAR | Status: AC
  Filled 2018-05-19: qty 1

## 2018-05-19 MED ORDER — DEXAMETHASONE SODIUM PHOSPHATE 10 MG/ML IJ SOLN: 10.0000 mg | Freq: Once | INTRAMUSCULAR | Status: DC

## 2018-05-19 MED ORDER — SODIUM CHLORIDE 0.9 % IV SOLN
20.0000 mg | Freq: Once | INTRAVENOUS | Status: AC
  Administered 2018-05-19: 10:00:00 20 mg via INTRAVENOUS
  Filled 2018-05-19: qty 2

## 2018-05-19 NOTE — Patient Instructions (Signed)
[facility] Discharge Instructions for Patients Receiving Chemotherapy  Today you received the following chemotherapy agents Taxol, Carboplatin  To help prevent nausea and vomiting after your treatment, we encourage you to take your nausea medication. DO TAKE ONDANSATRON FOR THREE DAYS.   If you develop nausea and vomiting that is not controlled by your nausea medication, call the clinic.   BELOW ARE SYMPTOMS THAT SHOULD BE REPORTED IMMEDIATELY:  *FEVER GREATER THAN 100.5 F  *CHILLS WITH OR WITHOUT FEVER  NAUSEA AND VOMITING THAT IS NOT CONTROLLED WITH YOUR NAUSEA MEDICATION  *UNUSUAL SHORTNESS OF BREATH  *UNUSUAL BRUISING OR BLEEDING  TENDERNESS IN MOUTH AND THROAT WITH OR WITHOUT PRESENCE OF ULCERS  *URINARY PROBLEMS  *BOWEL PROBLEMS  UNUSUAL RASH Items with * indicate a potential emergency and should be followed up as soon as possible.  Feel free to call the clinic should you have any questions or concerns. The clinic phone number is [phone].  Please show the [facility] at check-in to the Emergency Department and triage nurse.  Paclitaxel injection What is this medicine? PACLITAXEL (PAK li TAX el) is a chemotherapy drug. It targets fast dividing cells, like cancer cells, and causes these cells to die. This medicine is used to treat ovarian cancer, breast cancer, lung cancer, Kaposi's sarcoma, and other cancers. This medicine may be used for other purposes; ask your health care provider or pharmacist if you have questions. COMMON BRAND NAME(S): Onxol, Taxol What should I tell my health care provider before I take this medicine? They need to know if you have any of these conditions: -history of irregular heartbeat -liver disease -low blood counts, like low white cell, platelet, or red cell counts -lung or breathing disease, like asthma -tingling of the fingers or toes, or other nerve disorder -an unusual or allergic reaction to paclitaxel,  alcohol, polyoxyethylated castor oil, other chemotherapy, other medicines, foods, dyes, or preservatives -pregnant or trying to get pregnant -breast-feeding How should I use this medicine? This drug is given as an infusion into a vein. It is administered in a hospital or clinic by a specially trained health care professional. Talk to your pediatrician regarding the use of this medicine in children. Special care may be needed. Overdosage: If you think you have taken too much of this medicine contact a poison control center or emergency room at once. NOTE: This medicine is only for you. Do not share this medicine with others. What if I miss a dose? It is important not to miss your dose. Call your doctor or health care professional if you are unable to keep an appointment. What may interact with this medicine? Do not take this medicine with any of the following medications: -disulfiram -metronidazole This medicine may also interact with the following medications: -antiviral medicines for hepatitis, HIV or AIDS -certain antibiotics like erythromycin and clarithromycin -certain medicines for fungal infections like ketoconazole and itraconazole -certain medicines for seizures like carbamazepine, phenobarbital, phenytoin -gemfibrozil -nefazodone -rifampin -St. John's wort This list may not describe all possible interactions. Give your health care provider a list of all the medicines, herbs, non-prescription drugs, or dietary supplements you use. Also tell them if you smoke, drink alcohol, or use illegal drugs. Some items may interact with your medicine. What should I watch for while using this medicine? Your condition will be monitored carefully while you are receiving this medicine. You will need important blood work done while you are taking this medicine. This medicine can cause serious allergic  reactions. To reduce your risk you will need to take other medicine(s) before treatment with this  medicine. If you experience allergic reactions like skin rash, itching or hives, swelling of the face, lips, or tongue, tell your doctor or health care professional right away. In some cases, you may be given additional medicines to help with side effects. Follow all directions for their use. This drug may make you feel generally unwell. This is not uncommon, as chemotherapy can affect healthy cells as well as cancer cells. Report any side effects. Continue your course of treatment even though you feel ill unless your doctor tells you to stop. Call your doctor or health care professional for advice if you get a fever, chills or sore throat, or other symptoms of a cold or flu. Do not treat yourself. This drug decreases your body's ability to fight infections. Try to avoid being around people who are sick. This medicine may increase your risk to bruise or bleed. Call your doctor or health care professional if you notice any unusual bleeding. Be careful brushing and flossing your teeth or using a toothpick because you may get an infection or bleed more easily. If you have any dental work done, tell your dentist you are receiving this medicine. Avoid taking products that contain aspirin, acetaminophen, ibuprofen, naproxen, or ketoprofen unless instructed by your doctor. These medicines may hide a fever. Do not become pregnant while taking this medicine. Women should inform their doctor if they wish to become pregnant or think they might be pregnant. There is a potential for serious side effects to an unborn child. Talk to your health care professional or pharmacist for more information. Do not breast-feed an infant while taking this medicine. Men are advised not to father a child while receiving this medicine. This product may contain alcohol. Ask your pharmacist or healthcare provider if this medicine contains alcohol. Be sure to tell all healthcare providers you are taking this medicine. Certain medicines,  like metronidazole and disulfiram, can cause an unpleasant reaction when taken with alcohol. The reaction includes flushing, headache, nausea, vomiting, sweating, and increased thirst. The reaction can last from 30 minutes to several hours. What side effects may I notice from receiving this medicine? Side effects that you should report to your doctor or health care professional as soon as possible: -allergic reactions like skin rash, itching or hives, swelling of the face, lips, or tongue -breathing problems -changes in vision -fast, irregular heartbeat -high or low blood pressure -mouth sores -pain, tingling, numbness in the hands or feet -signs of decreased platelets or bleeding - bruising, pinpoint red spots on the skin, black, tarry stools, blood in the urine -signs of decreased red blood cells - unusually weak or tired, feeling faint or lightheaded, falls -signs of infection - fever or chills, cough, sore throat, pain or difficulty passing urine -signs and symptoms of liver injury like dark yellow or brown urine; general ill feeling or flu-like symptoms; light-colored stools; loss of appetite; nausea; right upper belly pain; unusually weak or tired; yellowing of the eyes or skin -swelling of the ankles, feet, hands -unusually slow heartbeat Side effects that usually do not require medical attention (report to your doctor or health care professional if they continue or are bothersome): -diarrhea -hair loss -loss of appetite -muscle or joint pain -nausea, vomiting -pain, redness, or irritation at site where injected -tiredness This list may not describe all possible side effects. Call your doctor for medical advice about side effects. You may  report side effects to FDA at 1-800-FDA-1088. Where should I keep my medicine? This drug is given in a hospital or clinic and will not be stored at home. NOTE: This sheet is a summary. It may not cover all possible information. If you have  questions about this medicine, talk to your doctor, pharmacist, or health care provider.  2019 Elsevier/Gold Standard (2016-09-25 13:14:55) Carboplatin injection What is this medicine? CARBOPLATIN (KAR boe pla tin) is a chemotherapy drug. It targets fast dividing cells, like cancer cells, and causes these cells to die. This medicine is used to treat ovarian cancer and many other cancers. This medicine may be used for other purposes; ask your health care provider or pharmacist if you have questions. COMMON BRAND NAME(S): Paraplatin What should I tell my health care provider before I take this medicine? They need to know if you have any of these conditions: -blood disorders -hearing problems -kidney disease -recent or ongoing radiation therapy -an unusual or allergic reaction to carboplatin, cisplatin, other chemotherapy, other medicines, foods, dyes, or preservatives -pregnant or trying to get pregnant -breast-feeding How should I use this medicine? This drug is usually given as an infusion into a vein. It is administered in a hospital or clinic by a specially trained health care professional. Talk to your pediatrician regarding the use of this medicine in children. Special care may be needed. Overdosage: If you think you have taken too much of this medicine contact a poison control center or emergency room at once. NOTE: This medicine is only for you. Do not share this medicine with others. What if I miss a dose? It is important not to miss a dose. Call your doctor or health care professional if you are unable to keep an appointment. What may interact with this medicine? -medicines for seizures -medicines to increase blood counts like filgrastim, pegfilgrastim, sargramostim -some antibiotics like amikacin, gentamicin, neomycin, streptomycin, tobramycin -vaccines Talk to your doctor or health care professional before taking any of these  medicines: -acetaminophen -aspirin -ibuprofen -ketoprofen -naproxen This list may not describe all possible interactions. Give your health care provider a list of all the medicines, herbs, non-prescription drugs, or dietary supplements you use. Also tell them if you smoke, drink alcohol, or use illegal drugs. Some items may interact with your medicine. What should I watch for while using this medicine? Your condition will be monitored carefully while you are receiving this medicine. You will need important blood work done while you are taking this medicine. This drug may make you feel generally unwell. This is not uncommon, as chemotherapy can affect healthy cells as well as cancer cells. Report any side effects. Continue your course of treatment even though you feel ill unless your doctor tells you to stop. In some cases, you may be given additional medicines to help with side effects. Follow all directions for their use. Call your doctor or health care professional for advice if you get a fever, chills or sore throat, or other symptoms of a cold or flu. Do not treat yourself. This drug decreases your body's ability to fight infections. Try to avoid being around people who are sick. This medicine may increase your risk to bruise or bleed. Call your doctor or health care professional if you notice any unusual bleeding. Be careful brushing and flossing your teeth or using a toothpick because you may get an infection or bleed more easily. If you have any dental work done, tell your dentist you are receiving this medicine. Avoid  taking products that contain aspirin, acetaminophen, ibuprofen, naproxen, or ketoprofen unless instructed by your doctor. These medicines may hide a fever. Do not become pregnant while taking this medicine. Women should inform their doctor if they wish to become pregnant or think they might be pregnant. There is a potential for serious side effects to an unborn child. Talk to  your health care professional or pharmacist for more information. Do not breast-feed an infant while taking this medicine. What side effects may I notice from receiving this medicine? Side effects that you should report to your doctor or health care professional as soon as possible: -allergic reactions like skin rash, itching or hives, swelling of the face, lips, or tongue -signs of infection - fever or chills, cough, sore throat, pain or difficulty passing urine -signs of decreased platelets or bleeding - bruising, pinpoint red spots on the skin, black, tarry stools, nosebleeds -signs of decreased red blood cells - unusually weak or tired, fainting spells, lightheadedness -breathing problems -changes in hearing -changes in vision -chest pain -high blood pressure -low blood counts - This drug may decrease the number of white blood cells, red blood cells and platelets. You may be at increased risk for infections and bleeding. -nausea and vomiting -pain, swelling, redness or irritation at the injection site -pain, tingling, numbness in the hands or feet -problems with balance, talking, walking -trouble passing urine or change in the amount of urine Side effects that usually do not require medical attention (report to your doctor or health care professional if they continue or are bothersome): -hair loss -loss of appetite -metallic taste in the mouth or changes in taste This list may not describe all possible side effects. Call your doctor for medical advice about side effects. You may report side effects to FDA at 1-800-FDA-1088. Where should I keep my medicine? This drug is given in a hospital or clinic and will not be stored at home. NOTE: This sheet is a summary. It may not cover all possible information. If you have questions about this medicine, talk to your doctor, pharmacist, or health care provider.  2019 Elsevier/Gold Standard (2007-04-29 14:38:05)

## 2018-05-19 NOTE — Progress Notes (Signed)
Spoke w/ Dr. Julien Nordmann discontinue dexamethasone due to continued issues with tolerability. Also, keep dose of carboplatin at 190mg  today - do not increase dose for improved Scr.   Demetrius Charity, PharmD, [facility] Pharmacist Pharmacy Phone: [phone] 05/19/2018

## 2018-05-20 ENCOUNTER — Ambulatory Visit
Admission: RE | Admit: 2018-05-20 | Discharge: 2018-05-20 | Disposition: A | Discharge status: Home / Self Care | Payer: Medicare Other | Source: Ambulatory Visit | Attending: Radiation Oncology | Admitting: Radiation Oncology

## 2018-05-20 ENCOUNTER — Other Ambulatory Visit: Payer: Self-pay

## 2018-05-20 DIAGNOSIS — I2510 Atherosclerotic heart disease of native coronary artery without angina pectoris: Secondary | ICD-10-CM | POA: Diagnosis not present

## 2018-05-20 DIAGNOSIS — I10 Essential (primary) hypertension: Secondary | ICD-10-CM | POA: Diagnosis not present

## 2018-05-20 DIAGNOSIS — Z510 Encounter for antineoplastic radiation therapy: Secondary | ICD-10-CM | POA: Diagnosis not present

## 2018-05-20 DIAGNOSIS — C3402 Malignant neoplasm of left main bronchus: Secondary | ICD-10-CM | POA: Diagnosis not present

## 2018-05-20 DIAGNOSIS — C3412 Malignant neoplasm of upper lobe, left bronchus or lung: Secondary | ICD-10-CM | POA: Diagnosis not present

## 2018-05-20 DIAGNOSIS — I739 Peripheral vascular disease, unspecified: Secondary | ICD-10-CM | POA: Diagnosis not present

## 2018-05-20 DIAGNOSIS — J439 Emphysema, unspecified: Secondary | ICD-10-CM | POA: Diagnosis not present

## 2018-05-21 ENCOUNTER — Ambulatory Visit: Payer: Medicare Other

## 2018-05-21 ENCOUNTER — Ambulatory Visit
Admission: RE | Admit: 2018-05-21 | Discharge: 2018-05-21 | Disposition: A | Discharge status: Home / Self Care | Payer: Medicare Other | Source: Ambulatory Visit | Attending: Radiation Oncology | Admitting: Radiation Oncology

## 2018-05-21 ENCOUNTER — Other Ambulatory Visit: Payer: Self-pay

## 2018-05-21 DIAGNOSIS — C3402 Malignant neoplasm of left main bronchus: Secondary | ICD-10-CM | POA: Diagnosis not present

## 2018-05-21 DIAGNOSIS — I739 Peripheral vascular disease, unspecified: Secondary | ICD-10-CM | POA: Diagnosis not present

## 2018-05-21 DIAGNOSIS — Z510 Encounter for antineoplastic radiation therapy: Secondary | ICD-10-CM | POA: Diagnosis not present

## 2018-05-21 DIAGNOSIS — C3412 Malignant neoplasm of upper lobe, left bronchus or lung: Secondary | ICD-10-CM | POA: Diagnosis not present

## 2018-05-21 DIAGNOSIS — J439 Emphysema, unspecified: Secondary | ICD-10-CM | POA: Diagnosis not present

## 2018-05-21 DIAGNOSIS — I2510 Atherosclerotic heart disease of native coronary artery without angina pectoris: Secondary | ICD-10-CM | POA: Diagnosis not present

## 2018-05-21 DIAGNOSIS — I10 Essential (primary) hypertension: Secondary | ICD-10-CM | POA: Diagnosis not present

## 2018-05-22 ENCOUNTER — Other Ambulatory Visit: Payer: Self-pay

## 2018-05-22 ENCOUNTER — Ambulatory Visit
Admission: RE | Admit: 2018-05-22 | Discharge: 2018-05-22 | Disposition: A | Discharge status: Home / Self Care | Payer: Medicare Other | Source: Ambulatory Visit | Attending: Radiation Oncology | Admitting: Radiation Oncology

## 2018-05-22 DIAGNOSIS — I2510 Atherosclerotic heart disease of native coronary artery without angina pectoris: Secondary | ICD-10-CM | POA: Diagnosis not present

## 2018-05-22 DIAGNOSIS — C3412 Malignant neoplasm of upper lobe, left bronchus or lung: Secondary | ICD-10-CM | POA: Diagnosis not present

## 2018-05-22 DIAGNOSIS — J439 Emphysema, unspecified: Secondary | ICD-10-CM | POA: Diagnosis not present

## 2018-05-22 DIAGNOSIS — Z510 Encounter for antineoplastic radiation therapy: Secondary | ICD-10-CM | POA: Diagnosis not present

## 2018-05-22 DIAGNOSIS — I10 Essential (primary) hypertension: Secondary | ICD-10-CM | POA: Diagnosis not present

## 2018-05-22 DIAGNOSIS — C3402 Malignant neoplasm of left main bronchus: Secondary | ICD-10-CM | POA: Diagnosis not present

## 2018-05-22 DIAGNOSIS — I739 Peripheral vascular disease, unspecified: Secondary | ICD-10-CM | POA: Diagnosis not present

## 2018-05-23 ENCOUNTER — Ambulatory Visit: Payer: Medicare Other

## 2018-05-23 ENCOUNTER — Ambulatory Visit
Admission: RE | Admit: 2018-05-23 | Discharge: 2018-05-23 | Disposition: A | Discharge status: Home / Self Care | Payer: Medicare Other | Source: Ambulatory Visit | Attending: Radiation Oncology | Admitting: Radiation Oncology

## 2018-05-23 ENCOUNTER — Other Ambulatory Visit: Payer: Self-pay | Admitting: Radiation Oncology

## 2018-05-23 ENCOUNTER — Other Ambulatory Visit: Payer: Self-pay

## 2018-05-23 DIAGNOSIS — I2510 Atherosclerotic heart disease of native coronary artery without angina pectoris: Secondary | ICD-10-CM | POA: Diagnosis not present

## 2018-05-23 DIAGNOSIS — Z510 Encounter for antineoplastic radiation therapy: Secondary | ICD-10-CM | POA: Diagnosis not present

## 2018-05-23 DIAGNOSIS — I10 Essential (primary) hypertension: Secondary | ICD-10-CM | POA: Diagnosis not present

## 2018-05-23 DIAGNOSIS — C3402 Malignant neoplasm of left main bronchus: Secondary | ICD-10-CM | POA: Diagnosis not present

## 2018-05-23 DIAGNOSIS — I739 Peripheral vascular disease, unspecified: Secondary | ICD-10-CM | POA: Diagnosis not present

## 2018-05-23 DIAGNOSIS — J439 Emphysema, unspecified: Secondary | ICD-10-CM | POA: Diagnosis not present

## 2018-05-23 DIAGNOSIS — C3412 Malignant neoplasm of upper lobe, left bronchus or lung: Secondary | ICD-10-CM | POA: Diagnosis not present

## 2018-05-23 MED ORDER — SUCRALFATE 1 G PO TABS: 1.0000 g | ORAL_TABLET | Freq: Four times a day (QID) | ORAL | 2 refills | Status: DC

## 2018-05-26 ENCOUNTER — Inpatient Hospital Stay (HOSPITAL_BASED_OUTPATIENT_CLINIC_OR_DEPARTMENT_OTHER): Payer: Medicare Other | Admitting: Internal Medicine

## 2018-05-26 ENCOUNTER — Ambulatory Visit
Admission: RE | Admit: 2018-05-26 | Discharge: 2018-05-26 | Disposition: A | Discharge status: Home / Self Care | Payer: Medicare Other | Source: Ambulatory Visit | Attending: Radiation Oncology | Admitting: Radiation Oncology

## 2018-05-26 ENCOUNTER — Inpatient Hospital Stay: Payer: Medicare Other

## 2018-05-26 ENCOUNTER — Ambulatory Visit: Payer: Medicare Other

## 2018-05-26 ENCOUNTER — Encounter: Payer: Self-pay | Admitting: Internal Medicine

## 2018-05-26 ENCOUNTER — Other Ambulatory Visit: Payer: Self-pay

## 2018-05-26 VITALS — BP 132/80 | HR 80 | Temp 98.4°F | Resp 18 | Ht 74.0 in | Wt 208.3 lb

## 2018-05-26 DIAGNOSIS — C3412 Malignant neoplasm of upper lobe, left bronchus or lung: Secondary | ICD-10-CM | POA: Diagnosis not present

## 2018-05-26 DIAGNOSIS — Z888 Allergy status to other drugs, medicaments and biological substances status: Secondary | ICD-10-CM

## 2018-05-26 DIAGNOSIS — I2510 Atherosclerotic heart disease of native coronary artery without angina pectoris: Secondary | ICD-10-CM | POA: Diagnosis not present

## 2018-05-26 DIAGNOSIS — I10 Essential (primary) hypertension: Secondary | ICD-10-CM | POA: Diagnosis not present

## 2018-05-26 DIAGNOSIS — I739 Peripheral vascular disease, unspecified: Secondary | ICD-10-CM | POA: Diagnosis not present

## 2018-05-26 DIAGNOSIS — C3411 Malignant neoplasm of upper lobe, right bronchus or lung: Secondary | ICD-10-CM | POA: Diagnosis not present

## 2018-05-26 DIAGNOSIS — Z510 Encounter for antineoplastic radiation therapy: Secondary | ICD-10-CM | POA: Diagnosis not present

## 2018-05-26 DIAGNOSIS — C3492 Malignant neoplasm of unspecified part of left bronchus or lung: Secondary | ICD-10-CM

## 2018-05-26 DIAGNOSIS — R197 Diarrhea, unspecified: Secondary | ICD-10-CM | POA: Diagnosis not present

## 2018-05-26 DIAGNOSIS — R634 Abnormal weight loss: Secondary | ICD-10-CM | POA: Diagnosis not present

## 2018-05-26 DIAGNOSIS — K219 Gastro-esophageal reflux disease without esophagitis: Secondary | ICD-10-CM | POA: Diagnosis not present

## 2018-05-26 DIAGNOSIS — J439 Emphysema, unspecified: Secondary | ICD-10-CM | POA: Diagnosis not present

## 2018-05-26 DIAGNOSIS — E785 Hyperlipidemia, unspecified: Secondary | ICD-10-CM

## 2018-05-26 DIAGNOSIS — Z5111 Encounter for antineoplastic chemotherapy: Secondary | ICD-10-CM

## 2018-05-26 DIAGNOSIS — R140 Abdominal distension (gaseous): Secondary | ICD-10-CM

## 2018-05-26 DIAGNOSIS — R590 Localized enlarged lymph nodes: Secondary | ICD-10-CM

## 2018-05-26 DIAGNOSIS — C3402 Malignant neoplasm of left main bronchus: Secondary | ICD-10-CM | POA: Diagnosis not present

## 2018-05-26 DIAGNOSIS — R232 Flushing: Secondary | ICD-10-CM | POA: Diagnosis not present

## 2018-05-26 DIAGNOSIS — M25521 Pain in right elbow: Secondary | ICD-10-CM | POA: Diagnosis not present

## 2018-05-26 LAB — CBC WITH DIFFERENTIAL (CANCER CENTER ONLY)
Abs Immature Granulocytes: 0.03 10*3/uL (ref 0.00–0.07)
Basophils Absolute: 0.1 10*3/uL (ref 0.0–0.1)
Basophils Relative: 1 %
Eosinophils Absolute: 0.1 10*3/uL (ref 0.0–0.5)
Eosinophils Relative: 1 %
HCT: 42 % (ref 39.0–52.0)
Hemoglobin: 13.9 g/dL (ref 13.0–17.0)
Immature Granulocytes: 1 %
Lymphocytes Relative: 18 %
Lymphs Abs: 0.8 10*3/uL (ref 0.7–4.0)
MCH: 30.8 pg (ref 26.0–34.0)
MCHC: 33.1 g/dL (ref 30.0–36.0)
MCV: 92.9 fL (ref 80.0–100.0)
Monocytes Absolute: 0.4 10*3/uL (ref 0.1–1.0)
Monocytes Relative: 9 %
Neutro Abs: 3.2 10*3/uL (ref 1.7–7.7)
Neutrophils Relative %: 70 %
Platelet Count: 186 10*3/uL (ref 150–400)
RBC: 4.52 MIL/uL (ref 4.22–5.81)
RDW: 14 % (ref 11.5–15.5)
WBC Count: 4.6 10*3/uL (ref 4.0–10.5)
nRBC: 0 % (ref 0.0–0.2)

## 2018-05-26 LAB — CMP (CANCER CENTER ONLY)
ALT: 14 U/L (ref 0–44)
AST: 10 U/L — ABNORMAL LOW (ref 15–41)
Albumin: 3.6 g/dL (ref 3.5–5.0)
Alkaline Phosphatase: 108 U/L (ref 38–126)
Anion gap: 9 (ref 5–15)
BUN: 12 mg/dL (ref 8–23)
CO2: 22 mmol/L (ref 22–32)
Calcium: 8.7 mg/dL — ABNORMAL LOW (ref 8.9–10.3)
Chloride: 106 mmol/L (ref 98–111)
Creatinine: 1.05 mg/dL (ref 0.61–1.24)
GFR, Est AFR Am: >60 mL/min (ref >60)
GFR, Estimated: >60 mL/min (ref >60)
Glucose, Bld: 99 mg/dL (ref 70–99)
Potassium: 4.4 mmol/L (ref 3.5–5.1)
Sodium: 137 mmol/L (ref 135–145)
Total Bilirubin: 0.8 mg/dL (ref 0.3–1.2)
Total Protein: 6.5 g/dL (ref 6.5–8.1)

## 2018-05-26 MED ORDER — PALONOSETRON HCL INJECTION 0.25 MG/5ML
0.2500 mg | Freq: Once | INTRAVENOUS | Status: AC
  Administered 2018-05-26: 13:00:00 0.25 mg via INTRAVENOUS

## 2018-05-26 MED ORDER — DIPHENHYDRAMINE HCL 50 MG/ML IJ SOLN
INTRAMUSCULAR | Status: AC
  Filled 2018-05-26: qty 1

## 2018-05-26 MED ORDER — PALONOSETRON HCL INJECTION 0.25 MG/5ML
INTRAVENOUS | Status: AC
  Filled 2018-05-26: qty 5

## 2018-05-26 MED ORDER — SODIUM CHLORIDE 0.9 % IV SOLN
Freq: Once | INTRAVENOUS | Status: AC
  Administered 2018-05-26: 13:00:00 via INTRAVENOUS
  Filled 2018-05-26: qty 250

## 2018-05-26 MED ORDER — SODIUM CHLORIDE 0.9 % IV SOLN
222.6000 mg | Freq: Once | INTRAVENOUS | Status: AC
  Administered 2018-05-26: 220 mg via INTRAVENOUS
  Filled 2018-05-26: qty 22

## 2018-05-26 MED ORDER — SODIUM CHLORIDE 0.9 % IV SOLN
45.0000 mg/m2 | Freq: Once | INTRAVENOUS | Status: AC
  Administered 2018-05-26: 14:00:00 102 mg via INTRAVENOUS
  Filled 2018-05-26: qty 17

## 2018-05-26 MED ORDER — SODIUM CHLORIDE 0.9 % IV SOLN
20.0000 mg | Freq: Once | INTRAVENOUS | Status: AC
  Administered 2018-05-26: 13:00:00 20 mg via INTRAVENOUS
  Filled 2018-05-26: qty 2

## 2018-05-26 MED ORDER — DIPHENHYDRAMINE HCL 50 MG/ML IJ SOLN
50.0000 mg | Freq: Once | INTRAMUSCULAR | Status: AC
  Administered 2018-05-26: 50 mg via INTRAVENOUS

## 2018-05-26 NOTE — Patient Instructions (Signed)
[facility] Discharge Instructions for Patients Receiving Chemotherapy  Today you received the following chemotherapy agents:  Taxol, Carboplatin  To help prevent nausea and vomiting after your treatment, we encourage you to take your nausea medication as prescribed.   If you develop nausea and vomiting that is not controlled by your nausea medication, call the clinic.   BELOW ARE SYMPTOMS THAT SHOULD BE REPORTED IMMEDIATELY:  *FEVER GREATER THAN 100.5 F  *CHILLS WITH OR WITHOUT FEVER  NAUSEA AND VOMITING THAT IS NOT CONTROLLED WITH YOUR NAUSEA MEDICATION  *UNUSUAL SHORTNESS OF BREATH  *UNUSUAL BRUISING OR BLEEDING  TENDERNESS IN MOUTH AND THROAT WITH OR WITHOUT PRESENCE OF ULCERS  *URINARY PROBLEMS  *BOWEL PROBLEMS  UNUSUAL RASH Items with * indicate a potential emergency and should be followed up as soon as possible.  Feel free to call the clinic should you have any questions or concerns. The clinic phone number is [phone].  Please show the CHEMO ALERT CARD at check-in to the Emergency Department and triage nurse.   

## 2018-05-26 NOTE — Progress Notes (Signed)
[facility] [phone]   [phone]  OFFICE PROGRESS NOTE  [name], MD [place] [address]  DIAGNOSIS: Stage IIB (T1c, N1, M0) non-small cell lung cancer favoring adenocarcinoma presented with right upper lobe lung nodule in addition to right hilar lymphadenopathy diagnosed in February 2020.  The patient is not a good surgical candidate for resection.  PRIOR THERAPY: None  CURRENT THERAPY: Concurrent chemoradiation with weekly carboplatin for AUC of 2 and paclitaxel 45 mg/M2.  First dose May 05, 2018.  Status post 3 cycles.  INTERVAL HISTORY: Seth [name] 73 y.o. male returns to the clinic today for follow-up visit.  The patient is feeling fine today with no concerning complaints except for mild odynophagia and abdominal bloating.  He is currently receiving 20 mg p.o. twice daily.  He denied having any current chest pain, shortness of breath, cough or hemoptysis.  He denied having any fever or chills.  He has no nausea, vomiting, diarrhea or constipation.  He is here today for evaluation before starting cycle #4 of his treatment.  MEDICAL HISTORY: Past Medical History:  Diagnosis Date  . AAA (abdominal aortic aneurysm) ([facility])   . Allergic rhinitis   . Anxiety   . Back pain   . CAD (coronary artery disease)   . Cataracts, bilateral   . Emphysema, unspecified ([facility])     " moderate"  . Essential hypertension, benign   . GERD (gastroesophageal reflux disease)   . Gout   . Headache    migraines  . Heart murmur    as a child only  . Hilar adenopathy   . History of hiatal hernia   . History of sciatica   . Hyperlipidemia   . Lung nodule   . Pneumonia    as child  . Prostate hypertrophy   . PVD (peripheral vascular disease) ([facility])   . Seasonal allergies   . Subclavian steal syndrome of left subclavian artery   . Wears dentures   . Wears dentures     ALLERGIES:  is allergic to biaxin [clarithromycin];  clarithromycin; iodinated diagnostic agents; prednisone; cortisone; flomax [tamsulosin hcl]; and iodine.  MEDICATIONS:  Current Outpatient Medications  Medication Sig Dispense Refill  . aspirin EC 81 MG tablet Take 81 mg by mouth daily as needed for mild pain.    Marland Kitchen atorvastatin (LIPITOR) 40 MG tablet Take 1 tablet (40 mg total) by mouth daily. 90 tablet 3  . cetirizine (ZYRTEC) 10 MG tablet Take 10 mg by mouth daily at 12 noon.    . clobetasol cream (TEMOVATE) 1.68 % Apply 1 application topically 2 (two) times daily as needed (rash/skin irritation.).     Marland Kitchen clopidogrel (PLAVIX) 75 MG tablet Take 1 tablet (75 mg total) by mouth daily. 90 tablet 2  . clotrimazole (LOTRIMIN) 1 % cream Apply 1 application topically 2 (two) times daily. To affected area on groin. Use for 2 weeks, or at least 2 days after resolution of rash (Patient taking differently: Apply 1 application topically 2 (two) times daily as needed (rash/fungal infection.). To affected area on groin. Use for 2 weeks, or at least 2 days after resolution of rash) 30 g 0  . colchicine 0.6 MG tablet TAKE 1 TABLET BY MOUTH TWICE A DAY 180 tablet 1  . famotidine (PEPCID) 20 MG tablet Take 20 mg by mouth 2 (two) times daily. Noon & in the evening.    Marland Kitchen lisinopril (PRINIVIL,ZESTRIL) 10 MG tablet  TAKE 1 TABLET BY MOUTH EVERY DAY 90 tablet 1  . lisinopril (PRINIVIL,ZESTRIL) 5 MG tablet TAKE 1 TABLET BY MOUTH EVERY DAY 90 tablet 0  . loperamide (IMODIUM) 2 MG capsule Take 4 mg by mouth as needed for diarrhea or loose stools.    . metoprolol tartrate (LOPRESSOR) 50 MG tablet TAKE 1.5 TABLETS (75 MG TOTAL) BY MOUTH 2 (TWO) TIMES DAILY. 270 tablet 0  . prochlorperazine (COMPAZINE) 10 MG tablet Take 1 tablet (10 mg total) by mouth every 6 (six) hours as needed for nausea or vomiting. (Patient not taking: Reported on 04/28/2018) 30 tablet 0  . sucralfate (CARAFATE) 1 g tablet Take 1 tablet (1 g total) by mouth 4 (four) times daily. 120 tablet 2  .  Triamcinolone Acetonide (NASACORT AQ NA) Place 1 spray into the nose daily at 2 am. (0300 or 0400)    . triamcinolone cream (KENALOG) 0.1 % Apply 1 application topically 2 (two) times daily as needed. (Patient taking differently: Apply 1 application topically 2 (two) times daily as needed (rash/skin irritation.). ) 30 g 0   Current Facility-Administered Medications  Medication Dose Route Frequency Provider Last Rate Last Dose  . 0.9 %  sodium chloride infusion  500 mL Intravenous Once Irene Shipper, MD        SURGICAL HISTORY:  Past Surgical History:  Procedure Laterality Date  . CARDIAC CATHETERIZATION     stent placement  . COLONOSCOPY W/ BIOPSIES AND POLYPECTOMY    . ILIAC ARTERY STENT    . MULTIPLE TOOTH EXTRACTIONS    . VIDEO BRONCHOSCOPY WITH ENDOBRONCHIAL NAVIGATION N/A 02/26/2018   Procedure: VIDEO BRONCHOSCOPY WITH ENDOBRONCHIAL NAVIGATION;  Surgeon: Garner Nash, DO;  Location: [facility];  Service: Thoracic;  Laterality: N/A;  . VIDEO BRONCHOSCOPY WITH ENDOBRONCHIAL NAVIGATION N/A 03/19/2018   Procedure: VIDEO BRONCHOSCOPY WITH ENDOBRONCHIAL NAVIGATION and endobronchial ultrasound;  Surgeon: Garner Nash, DO;  Location: [facility];  Service: Thoracic;  Laterality: N/A;  . VIDEO BRONCHOSCOPY WITH ENDOBRONCHIAL ULTRASOUND N/A 02/26/2018   Procedure: VIDEO BRONCHOSCOPY WITH ENDOBRONCHIAL ULTRASOUND;  Surgeon: Garner Nash, DO;  Location: [facility];  Service: Thoracic;  Laterality: N/A;  . VIDEO BRONCHOSCOPY WITH ENDOBRONCHIAL ULTRASOUND N/A 03/19/2018   Procedure: NAVIGATION BRONCHOSCOPY;  Surgeon: Garner Nash, DO;  Location: [facility];  Service: Thoracic;  Laterality: N/A;    REVIEW OF SYSTEMS:  A comprehensive review of systems was negative except for: Gastrointestinal: positive for change in bowel habits and odynophagia   PHYSICAL EXAMINATION: General appearance: alert, cooperative, fatigued and no distress Head: Normocephalic, without obvious abnormality, atraumatic Neck: no  adenopathy, no JVD, supple, symmetrical, trachea midline and thyroid not enlarged, symmetric, no tenderness/mass/nodules Lymph nodes: Cervical, supraclavicular, and axillary nodes normal. Resp: clear to auscultation bilaterally Back: symmetric, no curvature. ROM normal. No CVA tenderness. Cardio: regular rate and rhythm, S1, S2 normal, no murmur, click, rub or gallop GI: soft, non-tender; bowel sounds normal; no masses,  no organomegaly Extremities: extremities normal, atraumatic, no cyanosis or edema  ECOG PERFORMANCE STATUS: 1 - Symptomatic but completely ambulatory  Blood pressure 132/80, pulse 80, temperature 98.4 F (36.9 C), temperature source Oral, resp. rate 18, height 6\' 2"  (1.88 m), weight 208 lb 4.8 oz (94.5 kg), SpO2 100 %.  LABORATORY DATA: Lab Results  Component Value Date   WBC 4.6 05/26/2018   HGB 13.9 05/26/2018   HCT 42.0 05/26/2018   MCV 92.9 05/26/2018   PLT 186 05/26/2018      Chemistry  Component Value Date/Time   NA 137 05/26/2018 1032   NA 140 04/28/2018 1647   K 4.4 05/26/2018 1032   CL 106 05/26/2018 1032   CO2 22 05/26/2018 1032   BUN 12 05/26/2018 1032   BUN 9 04/28/2018 1647   CREATININE 1.05 05/26/2018 1032   CREATININE 1.09 03/23/2015 0928      Component Value Date/Time   CALCIUM 8.7 (L) 05/26/2018 1032   ALKPHOS 108 05/26/2018 1032   AST 10 (L) 05/26/2018 1032   ALT 14 05/26/2018 1032   BILITOT 0.8 05/26/2018 1032       RADIOGRAPHIC STUDIES: Dg Foot 2 Views Left  Result Date: 04/28/2018 CLINICAL DATA:  Left foot pain EXAM: LEFT FOOT - 2 VIEW COMPARISON:  None. FINDINGS: There is no evidence of fracture or dislocation. There is mild osteoarthritis of the first MTP joint. There is a small plantar calcaneal spur. Soft tissues are unremarkable. IMPRESSION: No acute osseous injury of the left foot. Electronically Signed   By: Kathreen Devoid   On: 04/28/2018 14:27    ASSESSMENT AND PLAN: This is a very pleasant 73 years old white male  recently diagnosed with unresectable stage IIb non-small cell lung cancer, adenocarcinoma presented with right upper lobe lung nodule in addition to right hilar lymphadenopathy diagnosed in February 2020.  The patient is now a good candidate for surgical resection. The patient is currently on treatment with concurrent chemoradiation with weekly carboplatin and paclitaxel status post 3 cycles. He continues to tolerate his treatment well with no concerning adverse effects except for mild odynophagia. I recommended for the patient to proceed with cycle #4 today as scheduled. He will come back for follow-up visit in 2 weeks for evaluation before the next cycle of his treatment. The patient was advised to call immediately if he has any concerning symptoms in the interval. The patient voices understanding of current disease status and treatment options and is in agreement with the current care plan. All questions were answered. The patient knows to call the clinic with any problems, questions or concerns. We can certainly see the patient much sooner if necessary. I spent 10 minutes counseling the patient face to face. The total time spent in the appointment was 15 minutes.  Disclaimer: This note was dictated with voice recognition software. Similar sounding words can inadvertently be transcribed and may not be corrected upon review.

## 2018-05-27 ENCOUNTER — Telehealth: Payer: Self-pay | Admitting: Medical Oncology

## 2018-05-27 ENCOUNTER — Ambulatory Visit
Admission: RE | Admit: 2018-05-27 | Discharge: 2018-05-27 | Disposition: A | Discharge status: Home / Self Care | Payer: Medicare Other | Source: Ambulatory Visit | Attending: Radiation Oncology | Admitting: Radiation Oncology

## 2018-05-27 ENCOUNTER — Ambulatory Visit: Payer: Medicare Other

## 2018-05-27 ENCOUNTER — Ambulatory Visit (INDEPENDENT_AMBULATORY_CARE_PROVIDER_SITE_OTHER): Payer: Medicare Other | Admitting: Family Medicine

## 2018-05-27 ENCOUNTER — Other Ambulatory Visit: Payer: Self-pay

## 2018-05-27 VITALS — BP 130/80 | Ht 72.0 in | Wt 208.0 lb

## 2018-05-27 DIAGNOSIS — C3402 Malignant neoplasm of left main bronchus: Secondary | ICD-10-CM | POA: Diagnosis not present

## 2018-05-27 DIAGNOSIS — Z510 Encounter for antineoplastic radiation therapy: Secondary | ICD-10-CM | POA: Diagnosis not present

## 2018-05-27 DIAGNOSIS — J439 Emphysema, unspecified: Secondary | ICD-10-CM | POA: Diagnosis not present

## 2018-05-27 DIAGNOSIS — C3412 Malignant neoplasm of upper lobe, left bronchus or lung: Secondary | ICD-10-CM | POA: Diagnosis not present

## 2018-05-27 DIAGNOSIS — I2510 Atherosclerotic heart disease of native coronary artery without angina pectoris: Secondary | ICD-10-CM | POA: Diagnosis not present

## 2018-05-27 DIAGNOSIS — I739 Peripheral vascular disease, unspecified: Secondary | ICD-10-CM | POA: Diagnosis not present

## 2018-05-27 DIAGNOSIS — Z0000 Encounter for general adult medical examination without abnormal findings: Secondary | ICD-10-CM

## 2018-05-27 DIAGNOSIS — I10 Essential (primary) hypertension: Secondary | ICD-10-CM | POA: Diagnosis not present

## 2018-05-27 NOTE — Telephone Encounter (Signed)
"  sneezing a lot and getting blood".  Can he take Nasocort?  On Aspirin and Plavix.  Taxol ,carbo received yesterday.   ILVM to return my call.

## 2018-05-27 NOTE — Progress Notes (Signed)
Presents today for [employer] Visit   Date of last exam: 01/07/2018  Interpreter used for this visit?  No  Patient Care Team: [name], MD as PCP - General (Family Medicine) Mosetta Anis, MD as Referring Physician (Allergy)   Other items to address today:  Patient is going through Chemo will discuss  Shingrix  /pnuemovax  Not exercising wants to get back to it after he finishes chemo/radiation.      ADVANCE DIRECTIVES: Discussed: yes On File: no Materials Provided: yes  Immunization status:  Immunization History  Administered Date(s) Administered  . Hepatitis B 02/06/2000  . Td 02/05/2005     Health Maintenance Due  Topic Date Due  . PNA vac Low Risk Adult (1 of 2 - PCV13) 04/26/2010     Functional Status Survey: Is the patient deaf or have difficulty hearing?: No Does the patient have difficulty seeing, even when wearing glasses/contacts?: No Does the patient have difficulty concentrating, remembering, or making decisions?: No Does the patient have difficulty walking or climbing stairs?: No Does the patient have difficulty doing errands alone such as visiting a doctor's office or shopping?: No   6CIT Screen 05/27/2018 04/16/2017  What Year? 0 points 0 points  What month? 0 points 0 points  What time? 0 points 0 points  Count back from 20 0 points 0 points  Months in reverse 0 points 0 points  Repeat phrase 0 points 0 points  Total Score 0 0        Clinical Support from 05/27/2018 in [facility] at [facility]  AUDIT-C Score  0       Home Environment:   Two story home with wife.  No trouble climbing stairs No scattered rugs No grab bars Well lit.   Patient Active Problem List   Diagnosis Date Noted  . Encounter for antineoplastic chemotherapy 04/18/2018  . Goals of care, counseling/discussion 04/18/2018  . Adenocarcinoma of left lung, stage 2 ([place]) 03/27/2018  . Abnormal PET of left lung   . S/P bronchoscopy   .  Solitary lung nodule 02/20/2018  . Hilar adenopathy 02/20/2018  . Abnormal findings on diagnostic imaging of lung 02/20/2018  . Platelet inhibition due to Plavix 02/20/2018  . PVD (peripheral vascular disease) ([place]) 10/15/2013  . Tobacco use disorder 10/15/2013  . Pure hypercholesterolemia 10/15/2013  . Coronary artery disease involving native coronary artery of native heart without angina pectoris 10/15/2013  . Cataract 09/29/2013  . Colon cancer screening 09/25/2013  . AAA (abdominal aortic aneurysm) without rupture ([place]) 10/22/2012  . Nicotine addiction 10/17/2012  . Atherosclerosis of native arteries of the extremities with ulceration(440.23) 10/17/2012  . Carotid bruit 10/17/2012  . Other nonspecific abnormal cardiovascular system function study 04/10/2012  . History of sciatica   . Essential hypertension, benign   . Seasonal allergies      Past Medical History:  Diagnosis Date  . AAA (abdominal aortic aneurysm) ([place])   . Allergic rhinitis   . Anxiety   . Back pain   . CAD (coronary artery disease)   . Cataracts, bilateral   . Emphysema, unspecified ([place])     " moderate"  . Essential hypertension, benign   . GERD (gastroesophageal reflux disease)   . Gout   . Headache    migraines  . Heart murmur    as a child only  . Hilar adenopathy   . History of hiatal hernia   . History of sciatica   . Hyperlipidemia   .  Lung nodule   . Pneumonia    as child  . Prostate hypertrophy   . PVD (peripheral vascular disease) ([place])   . Seasonal allergies   . Subclavian steal syndrome of left subclavian artery   . Wears dentures   . Wears dentures      Past Surgical History:  Procedure Laterality Date  . CARDIAC CATHETERIZATION     stent placement  . COLONOSCOPY W/ BIOPSIES AND POLYPECTOMY    . ILIAC ARTERY STENT    . MULTIPLE TOOTH EXTRACTIONS    . VIDEO BRONCHOSCOPY WITH ENDOBRONCHIAL NAVIGATION N/A 02/26/2018   Procedure: VIDEO BRONCHOSCOPY WITH ENDOBRONCHIAL  NAVIGATION;  Surgeon: Garner Nash, DO;  Location: [facility];  Service: Thoracic;  Laterality: N/A;  . VIDEO BRONCHOSCOPY WITH ENDOBRONCHIAL NAVIGATION N/A 03/19/2018   Procedure: VIDEO BRONCHOSCOPY WITH ENDOBRONCHIAL NAVIGATION and endobronchial ultrasound;  Surgeon: Garner Nash, DO;  Location: [facility];  Service: Thoracic;  Laterality: N/A;  . VIDEO BRONCHOSCOPY WITH ENDOBRONCHIAL ULTRASOUND N/A 02/26/2018   Procedure: VIDEO BRONCHOSCOPY WITH ENDOBRONCHIAL ULTRASOUND;  Surgeon: Garner Nash, DO;  Location: [facility];  Service: Thoracic;  Laterality: N/A;  . VIDEO BRONCHOSCOPY WITH ENDOBRONCHIAL ULTRASOUND N/A 03/19/2018   Procedure: NAVIGATION BRONCHOSCOPY;  Surgeon: Garner Nash, DO;  Location: [facility];  Service: Thoracic;  Laterality: N/A;     Family History  Problem Relation Age of Onset  . Hypertension Mother   . Heart disease Mother   . Stroke Mother   . Heart disease Paternal Grandmother   . Diabetes Paternal Grandmother   . Hyperlipidemia Paternal Grandmother   . Diabetes Sister   . Heart disease Sister   . Hyperlipidemia Sister   . Heart disease Brother   . Hyperlipidemia Brother   . Hypertension Brother   . Diabetes Paternal Grandfather   . Heart disease Paternal Grandfather   . Hyperlipidemia Paternal Grandfather   . Heart disease Maternal Uncle        x 2  . Cancer Maternal Grandmother        type unknown, ? lung  . Leukemia Maternal Uncle      Social History   Socioeconomic History  . Marital status: Married    Spouse name: Not on file  . Number of children: 1  . Years of education: Not on file  . Highest education level: Not on file  Occupational History  . Occupation: retired  Scientific laboratory technician  . Financial resource strain: Not hard at all  . Food insecurity:    Worry: Never true    Inability: Never true  . Transportation needs:    Medical: No    Non-medical: No  Tobacco Use  . Smoking status: Former Smoker    Packs/day: 3.00    Years: 50.00     Pack years: 150.00    Types: Cigarettes    Last attempt to quit: 04/05/2016    Years since quitting: 2.1  . Smokeless tobacco: Never Used  Substance and Sexual Activity  . Alcohol use: Yes    Alcohol/week: 0.0 standard drinks    Comment: rare  . Drug use: No  . Sexual activity: Not on file  Lifestyle  . Physical activity:    Days per week: 0 days    Minutes per session: 0 min  . Stress: Not at all  Relationships  . Social connections:    Talks on phone: More than three times a week    Gets together: More than three times a week  Attends religious service: Never    Active member of club or organization: No    Attends meetings of clubs or organizations: Never    Relationship status: Married  . Intimate partner violence:    Fear of current or ex partner: No    Emotionally abused: No    Physically abused: No    Forced sexual activity: No  Other Topics Concern  . Not on file  Social History Narrative   Married   Exercise: No   Education: GED     Allergies  Allergen Reactions  . Biaxin [Clarithromycin] Shortness Of Breath  . Clarithromycin Shortness Of Breath  . Iodinated Diagnostic Agents Hives  . Prednisone Other (See Comments)    Patient states he turns REALLY RED all over.  . Cortisone Other (See Comments)    Turns red from breast up  . Flomax [Tamsulosin Hcl]     Blurred vision and lower back pain   . Hepatitis B Virus Vaccines     Nerve problems States his previous doctor told him it was due to this   . Iodine Hives    Dye from nuclear medicine test     Prior to Admission medications   Medication Sig Start Date End Date Taking? Authorizing Provider  aspirin EC 81 MG tablet Take 81 mg by mouth daily as needed for mild pain.   Yes [provider]  atorvastatin (LIPITOR) 40 MG tablet Take 1 tablet (40 mg total) by mouth daily. 04/09/18 04/09/19 Yes Turner, Eber Hong, MD  cetirizine (ZYRTEC) 10 MG tablet Take 10 mg by mouth daily at 12 noon.   Yes [provider]  clobetasol cream (TEMOVATE) 3.22 % Apply 1 application topically 2 (two) times daily as needed (rash/skin irritation.).  05/20/17  Yes [provider]  clopidogrel (PLAVIX) 75 MG tablet Take 1 tablet (75 mg total) by mouth daily. 04/23/17  Yes [name], MD  clotrimazole (LOTRIMIN) 1 % cream Apply 1 application topically 2 (two) times daily. To affected area on groin. Use for 2 weeks, or at least 2 days after resolution of rash Patient taking differently: Apply 1 application topically 2 (two) times daily as needed (rash/fungal infection.). To affected area on groin. Use for 2 weeks, or at least 2 days after resolution of rash 10/23/16  Yes [name], MD  diphenhydrAMINE (BENADRYL) 25 MG tablet Take 25 mg by mouth every 6 (six) hours as needed.   Yes [provider]  famotidine (PEPCID) 20 MG tablet Take 20 mg by mouth 2 (two) times daily. Noon & in the evening.   Yes [provider]  lisinopril (PRINIVIL,ZESTRIL) 10 MG tablet TAKE 1 TABLET BY MOUTH EVERY DAY 03/17/18  Yes [name], MD  lisinopril (PRINIVIL,ZESTRIL) 5 MG tablet TAKE 1 TABLET BY MOUTH EVERY DAY 04/16/18  Yes [name], MD  loperamide (IMODIUM) 2 MG capsule Take 4 mg by mouth as needed for diarrhea or loose stools.   Yes [provider]  metoprolol tartrate (LOPRESSOR) 50 MG tablet TAKE 1.5 TABLETS (75 MG TOTAL) BY MOUTH 2 (TWO) TIMES DAILY. 04/16/18  Yes [name], MD  sucralfate (CARAFATE) 1 g tablet Take 1 tablet (1 g total) by mouth 4 (four) times daily. 05/23/18  Yes Kyung Rudd, MD  Triamcinolone Acetonide (NASACORT AQ NA) Place 1 spray into the nose daily at 2 am. (0300 or 0400)   Yes [provider]  triamcinolone cream (KENALOG) 0.1 % Apply 1 application  topically 2 (two) times daily as needed. Patient taking differently: Apply 1 application topically 2 (two) times daily as needed (rash/skin irritation.).  08/29/17  Yes [name], MD  colchicine 0.6 MG tablet TAKE 1 TABLET BY MOUTH TWICE A DAY Patient not taking: Reported on 05/27/2018 05/07/18   [name], MD  prochlorperazine (COMPAZINE) 10 MG tablet Take 1 tablet (10 mg total) by mouth every 6 (six) hours as needed for nausea or vomiting. Patient not taking: Reported on 04/28/2018 04/17/18   Curt Bears, MD     Depression screen [facility] 2/9 05/27/2018 04/28/2018 01/07/2018 12/24/2017 12/10/2017  Decreased Interest 0 0 0 0 0  Down, Depressed, Hopeless 0 0 0 0 0  PHQ - 2 Score 0 0 0 0 0     Fall Risk  05/27/2018 04/28/2018 01/07/2018 12/24/2017 12/10/2017  Falls in the past year? 0 0 0 0 0  Number falls in past yr: 0 0 - - -  Injury with Fall? 0 0 - - -  Follow up - Falls evaluation completed - - -      PHYSICAL EXAM: BP 130/80   Ht 6' (1.829 m)   Wt 208 lb (94.3 kg)   BMI 28.21 kg/m    Wt Readings from Last 3 Encounters:  05/27/18 208 lb (94.3 kg)  05/26/18 208 lb 4.8 oz (94.5 kg)  05/12/18 209 lb 14.4 oz (95.2 kg)     No exam data present    Physical Exam   Education/Counseling provided regarding diet and exercise, prevention of chronic diseases, smoking/tobacco cessation, if applicable, and reviewed "Covered Medicare Preventive Services."   ASSESSMENT/PLAN: 1. Medicare annual wellness visit, subsequent  Other orders

## 2018-05-27 NOTE — Telephone Encounter (Signed)
Yes he can take Nasacort prescribed by his primary care physician.  He may also need to discuss with his primary care physician or cardiologist holding Plavix and aspirin for few days because of the nosebleed.

## 2018-05-27 NOTE — Patient Instructions (Signed)
Healthy Eating Following a healthy eating pattern may help you to achieve and maintain a healthy body weight, reduce the risk of chronic disease, and live a long and productive life. It is important to follow a healthy eating pattern at an appropriate calorie level for your body. Your nutritional needs should be met primarily through food by choosing a variety of nutrient-rich foods. What are tips for following this plan? Reading food labels  Read labels and choose the following: ? Reduced or low sodium. ? Juices with 100% fruit juice. ? Foods with low saturated fats and high polyunsaturated and monounsaturated fats. ? Foods with whole grains, such as whole wheat, cracked wheat, brown rice, and wild rice. ? Whole grains that are fortified with folic acid. This is recommended for women who are pregnant or who want to become pregnant.  Read labels and avoid the following: ? Foods with a lot of added sugars. These include foods that contain brown sugar, corn sweetener, corn syrup, dextrose, fructose, glucose, high-fructose corn syrup, honey, invert sugar, lactose, malt syrup, maltose, molasses, raw sugar, sucrose, trehalose, or turbinado sugar.  Do not eat more than the following amounts of added sugar per day:  6 teaspoons (25 g) for women.  9 teaspoons (38 g) for men. ? Foods that contain processed or refined starches and grains. ? Refined grain products, such as white flour, degermed cornmeal, white bread, and white rice. Shopping  Choose nutrient-rich snacks, such as vegetables, whole fruits, and nuts. Avoid high-calorie and high-sugar snacks, such as potato chips, fruit snacks, and candy.  Use oil-based dressings and spreads on foods instead of solid fats such as butter, stick margarine, or cream cheese.  Limit pre-made sauces, mixes, and "instant" products such as flavored rice, instant noodles, and ready-made pasta.  Try more plant-protein sources, such as tofu, tempeh, black beans,  edamame, lentils, nuts, and seeds.  Explore eating plans such as the Mediterranean diet or vegetarian diet. Cooking  Use oil to saut or stir-fry foods instead of solid fats such as butter, stick margarine, or lard.  Try baking, boiling, grilling, or broiling instead of frying.  Remove the fatty part of meats before cooking.  Steam vegetables in water or broth. Meal planning   At meals, imagine dividing your plate into fourths: ? One-half of your plate is fruits and vegetables. ? One-fourth of your plate is whole grains. ? One-fourth of your plate is protein, especially lean meats, poultry, eggs, tofu, beans, or nuts.  Include low-fat dairy as part of your daily diet. Lifestyle  Choose healthy options in all settings, including home, work, school, restaurants, or stores.  Prepare your food safely: ? Wash your hands after handling raw meats. ? Keep food preparation surfaces clean by regularly washing with hot, soapy water. ? Keep raw meats separate from ready-to-eat foods, such as fruits and vegetables. ? Cook seafood, meat, poultry, and eggs to the recommended internal temperature. ? Store foods at safe temperatures. In general:  Keep cold foods at 59F (4.4C) or below.  Keep hot foods at 159F (60C) or above.  Keep your freezer at [facility] (-17.8C) or below.  Foods are no longer safe to eat when they have been between the temperatures of 40-159F (4.4-60C) for more than 2 hours. What foods should I eat? Fruits Aim to eat 2 cup-equivalents of fresh, canned (in natural juice), or frozen fruits each day. Examples of 1 cup-equivalent of fruit include 1 small apple, 8 large strawberries, 1 cup canned fruit,  cup  dried fruit, or 1 cup 100% juice. Vegetables Aim to eat 2-3 cup-equivalents of fresh and frozen vegetables each day, including different varieties and colors. Examples of 1 cup-equivalent of vegetables include 2 medium carrots, 2 cups raw, leafy greens, 1 cup chopped  vegetable (raw or cooked), or 1 medium baked potato. Grains Aim to eat 6 ounce-equivalents of whole grains each day. Examples of 1 ounce-equivalent of grains include 1 slice of bread, 1 cup ready-to-eat cereal, 3 cups popcorn, or  cup cooked rice, pasta, or cereal. Meats and other proteins Aim to eat 5-6 ounce-equivalents of protein each day. Examples of 1 ounce-equivalent of protein include 1 egg, 1/2 cup nuts or seeds, or 1 tablespoon (16 g) peanut butter. A cut of meat or fish that is the size of a deck of cards is about 3-4 ounce-equivalents.  Of the protein you eat each week, try to have at least 8 ounces come from seafood. This includes salmon, trout, herring, and anchovies. Dairy Aim to eat 3 cup-equivalents of fat-free or low-fat dairy each day. Examples of 1 cup-equivalent of dairy include 1 cup (240 mL) milk, 8 ounces (250 g) yogurt, 1 ounces (44 g) natural cheese, or 1 cup (240 mL) fortified soy milk. Fats and oils  Aim for about 5 teaspoons (21 g) per day. Choose monounsaturated fats, such as canola and olive oils, avocados, peanut butter, and most nuts, or polyunsaturated fats, such as sunflower, corn, and soybean oils, walnuts, pine nuts, sesame seeds, sunflower seeds, and flaxseed. Beverages  Aim for six 8-oz glasses of water per day. Limit coffee to three to five 8-oz cups per day.  Limit caffeinated beverages that have added calories, such as soda and energy drinks.  Limit alcohol intake to no more than 1 drink a day for nonpregnant women and 2 drinks a day for men. One drink equals 12 oz of beer (355 mL), 5 oz of wine (148 mL), or 1 oz of hard liquor (44 mL). Seasoning and other foods  Avoid adding excess amounts of salt to your foods. Try flavoring foods with herbs and spices instead of salt.  Avoid adding sugar to foods.  Try using oil-based dressings, sauces, and spreads instead of solid fats. This information is based on general [place] nutrition guidelines. For more  information, visit [employer]. Exact amounts may vary based on your nutrition needs. Summary  A healthy eating plan may help you to maintain a healthy weight, reduce the risk of chronic diseases, and stay active throughout your life.  Plan your meals. Make sure you eat the right portions of a variety of nutrient-rich foods.  Try baking, boiling, grilling, or broiling instead of frying.  Choose healthy options in all settings, including home, work, school, restaurants, or stores. This information is not intended to replace advice given to you by your health care provider. Make sure you discuss any questions you have with your health care provider. Document Released: 05/06/2017 Document Revised: 05/06/2017 Document Reviewed: 05/06/2017 Elsevier Interactive Patient Education  2019 [employer].

## 2018-05-28 ENCOUNTER — Ambulatory Visit
Admission: RE | Admit: 2018-05-28 | Discharge: 2018-05-28 | Disposition: A | Discharge status: Home / Self Care | Payer: Medicare Other | Source: Ambulatory Visit | Attending: Radiation Oncology | Admitting: Radiation Oncology

## 2018-05-28 ENCOUNTER — Ambulatory Visit: Payer: Medicare Other

## 2018-05-28 ENCOUNTER — Telehealth (INDEPENDENT_AMBULATORY_CARE_PROVIDER_SITE_OTHER): Payer: Medicare Other | Admitting: Family Medicine

## 2018-05-28 ENCOUNTER — Other Ambulatory Visit: Payer: Self-pay

## 2018-05-28 ENCOUNTER — Ambulatory Visit: Payer: Self-pay | Admitting: *Deleted

## 2018-05-28 DIAGNOSIS — J439 Emphysema, unspecified: Secondary | ICD-10-CM | POA: Diagnosis not present

## 2018-05-28 DIAGNOSIS — C3402 Malignant neoplasm of left main bronchus: Secondary | ICD-10-CM | POA: Diagnosis not present

## 2018-05-28 DIAGNOSIS — C3412 Malignant neoplasm of upper lobe, left bronchus or lung: Secondary | ICD-10-CM | POA: Diagnosis not present

## 2018-05-28 DIAGNOSIS — I10 Essential (primary) hypertension: Secondary | ICD-10-CM | POA: Diagnosis not present

## 2018-05-28 DIAGNOSIS — Z510 Encounter for antineoplastic radiation therapy: Secondary | ICD-10-CM | POA: Diagnosis not present

## 2018-05-28 DIAGNOSIS — R040 Epistaxis: Secondary | ICD-10-CM

## 2018-05-28 DIAGNOSIS — I2510 Atherosclerotic heart disease of native coronary artery without angina pectoris: Secondary | ICD-10-CM | POA: Diagnosis not present

## 2018-05-28 DIAGNOSIS — I739 Peripheral vascular disease, unspecified: Secondary | ICD-10-CM | POA: Diagnosis not present

## 2018-05-28 NOTE — Progress Notes (Signed)
Telemedicine Encounter- SOAP NOTE Established Patient  I discussed the limitations, risks, security and privacy concerns of performing an evaluation and management service by telephone and the availability of in person appointments. I also discussed with the patient that there may be a patient responsible charge related to this service. The patient expressed understanding and agreed to proceed.  This telephone encounter was conducted with the patient's verbal consent via audio telecommunications:yes Patient was instructed to have this encounter in a suitably private space; and to only have persons present to whom they give permission to participate. In addition, patient identity was confirmed by use of name plus two identifiers (DOB and address).  I spent a total of 70mintalking with the patient  Pt states he had been having episodes of nose bleeding everytime he blow his nose. He states he is a Chemo pt and was concerned. Pt states this has been going on since Saturday. No other symptoms at this time.   Subjective   Seth [name] is a 73 y.o.male established patient. Telephone visit today for nosebleeds  HPI Pt states nose bleeds started after severe sneeze. Pt with allergies-takes zyrtec daily then given benadryl on days he takes chemo. Pt also taking radiation for lung cancer. Pt on plavix. Pt states bleeding occurs after sneezing or blowing nose. Pt states bleed is no spontaeous. Pt uses nasal sprays -none recently consistently. Pt with nasal saline but has not used consistently. Pt does not have a humidifier. Pt states he has 2 dogs. Does not open windows in home but does walk outside. Pt states sinus congestion in the past. Pt states he has experienced some facial pressure but no drainage noted. Pt using medicated gargle due to irritation of the throat. Pt with no h/o nose bleeds.  No difficulty stopping the bleed-usually holds pressure. Reviewed labwork-cbc wnl.  Patient Active Problem  List   Diagnosis Date Noted  . Encounter for antineoplastic chemotherapy 04/18/2018  . Goals of care, counseling/discussion 04/18/2018  . Adenocarcinoma of left lung, stage 2 ([place]) 03/27/2018  . Abnormal PET of left lung   . S/P bronchoscopy   . Solitary lung nodule 02/20/2018  . Hilar adenopathy 02/20/2018  . Abnormal findings on diagnostic imaging of lung 02/20/2018  . Platelet inhibition due to Plavix 02/20/2018  . PVD (peripheral vascular disease) ([place]) 10/15/2013  . Tobacco use disorder 10/15/2013  . Pure hypercholesterolemia 10/15/2013  . Coronary artery disease involving native coronary artery of native heart without angina pectoris 10/15/2013  . Cataract 09/29/2013  . Colon cancer screening 09/25/2013  . AAA (abdominal aortic aneurysm) without rupture ([place]) 10/22/2012  . Nicotine addiction 10/17/2012  . Atherosclerosis of native arteries of the extremities with ulceration(440.23) 10/17/2012  . Carotid bruit 10/17/2012  . Other nonspecific abnormal cardiovascular system function study 04/10/2012  . History of sciatica   . Essential hypertension, benign   . Seasonal allergies     Past Medical History:  Diagnosis Date  . AAA (abdominal aortic aneurysm) ([place])   . Allergic rhinitis   . Anxiety   . Back pain   . CAD (coronary artery disease)   . Cataracts, bilateral   . Emphysema, unspecified ([place])     " moderate"  . Essential hypertension, benign   . GERD (gastroesophageal reflux disease)   . Gout   . Headache    migraines  . Heart murmur    as a child only  . Hilar adenopathy   . History of hiatal hernia   .  History of sciatica   . Hyperlipidemia   . Lung nodule   . Pneumonia    as child  . Prostate hypertrophy   . PVD (peripheral vascular disease) ([place])   . Seasonal allergies   . Subclavian steal syndrome of left subclavian artery   . Wears dentures   . Wears dentures     Current Outpatient Medications  Medication Sig Dispense Refill  . aspirin EC 81  MG tablet Take 81 mg by mouth daily as needed for mild pain.    Marland Kitchen atorvastatin (LIPITOR) 40 MG tablet Take 1 tablet (40 mg total) by mouth daily. 90 tablet 3  . cetirizine (ZYRTEC) 10 MG tablet Take 10 mg by mouth daily at 12 noon.    . clobetasol cream (TEMOVATE) 0.93 % Apply 1 application topically 2 (two) times daily as needed (rash/skin irritation.).     Marland Kitchen clopidogrel (PLAVIX) 75 MG tablet Take 1 tablet (75 mg total) by mouth daily. 90 tablet 2  . clotrimazole (LOTRIMIN) 1 % cream Apply 1 application topically 2 (two) times daily. To affected area on groin. Use for 2 weeks, or at least 2 days after resolution of rash (Patient taking differently: Apply 1 application topically 2 (two) times daily as needed (rash/fungal infection.). To affected area on groin. Use for 2 weeks, or at least 2 days after resolution of rash) 30 g 0  . colchicine 0.6 MG tablet TAKE 1 TABLET BY MOUTH TWICE A DAY 180 tablet 1  . diphenhydrAMINE (BENADRYL) 25 MG tablet Take 25 mg by mouth every 6 (six) hours as needed.    . famotidine (PEPCID) 20 MG tablet Take 20 mg by mouth 2 (two) times daily. Noon & in the evening.    Marland Kitchen lisinopril (PRINIVIL,ZESTRIL) 10 MG tablet TAKE 1 TABLET BY MOUTH EVERY DAY 90 tablet 1  . lisinopril (PRINIVIL,ZESTRIL) 5 MG tablet TAKE 1 TABLET BY MOUTH EVERY DAY 90 tablet 0  . loperamide (IMODIUM) 2 MG capsule Take 4 mg by mouth as needed for diarrhea or loose stools.    . metoprolol tartrate (LOPRESSOR) 50 MG tablet TAKE 1.5 TABLETS (75 MG TOTAL) BY MOUTH 2 (TWO) TIMES DAILY. 270 tablet 0  . prochlorperazine (COMPAZINE) 10 MG tablet Take 1 tablet (10 mg total) by mouth every 6 (six) hours as needed for nausea or vomiting. 30 tablet 0  . sucralfate (CARAFATE) 1 g tablet Take 1 tablet (1 g total) by mouth 4 (four) times daily. 120 tablet 2  . Triamcinolone Acetonide (NASACORT AQ NA) Place 1 spray into the nose daily at 2 am. (0300 or 0400)    . triamcinolone cream (KENALOG) 0.1 % Apply 1 application  topically 2 (two) times daily as needed. (Patient taking differently: Apply 1 application topically 2 (two) times daily as needed (rash/skin irritation.). ) 30 g 0   Current Facility-Administered Medications  Medication Dose Route Frequency Provider Last Rate Last Dose  . 0.9 %  sodium chloride infusion  500 mL Intravenous Once Irene Shipper, MD        Allergies  Allergen Reactions  . Biaxin [Clarithromycin] Shortness Of Breath  . Clarithromycin Shortness Of Breath  . Iodinated Diagnostic Agents Hives  . Prednisone Other (See Comments)    Patient states he turns REALLY RED all over.  . Cortisone Other (See Comments)    Turns red from breast up  . Flomax [Tamsulosin Hcl]     Blurred vision and lower back pain   . Hepatitis B Virus Vaccines  Nerve problems States his previous doctor told him it was due to this   . Iodine Hives    Dye from nuclear medicine test    Social History   Socioeconomic History  . Marital status: Married    Spouse name: Not on file  . Number of children: 1  . Years of education: Not on file  . Highest education level: Not on file  Occupational History  . Occupation: retired  Scientific laboratory technician  . Financial resource strain: Not hard at all  . Food insecurity:    Worry: Never true    Inability: Never true  . Transportation needs:    Medical: No    Non-medical: No  Tobacco Use  . Smoking status: Former Smoker    Packs/day: 3.00    Years: 50.00    Pack years: 150.00    Types: Cigarettes    Last attempt to quit: 04/05/2016    Years since quitting: 2.1  . Smokeless tobacco: Never Used  Substance and Sexual Activity  . Alcohol use: Yes    Alcohol/week: 0.0 standard drinks    Comment: rare  . Drug use: No  . Sexual activity: Not on file  Lifestyle  . Physical activity:    Days per week: 0 days    Minutes per session: 0 min  . Stress: Not at all  Relationships  . Social connections:    Talks on phone: More than three times a week    Gets  together: More than three times a week    Attends religious service: Never    Active member of club or organization: No    Attends meetings of clubs or organizations: Never    Relationship status: Married  . Intimate partner violence:    Fear of current or ex partner: No    Emotionally abused: No    Physically abused: No    Forced sexual activity: No  Other Topics Concern  . Not on file  Social History Narrative   Married   Exercise: No   Education: GED    ROS CONSTITUTIONAL: no  Fever EENT: sinus problems congestion, nasal congestion-blood streaks on mucous with blowing nose or sneezing, no ear pain, nosebleeds, sore throat-being treated with medicated gargle, facial tenderness-intermittently CV: no chest pain, RESP: no SOB,no  cough,   Objective   Vitals as reported by the patient: none  Frequent nosebleeds sinus saline-Ayr gel/saline Humidifier Avoid blowing nose  ENT referral if no improvemen   I discussed the assessment and treatment plan with the patient. The patient was provided an opportunity to ask questions and all were answered. The patient agreed with the plan and demonstrated an understanding of the instructions.   The patient was advised to call back or seek an in-person evaluation if the symptoms worsen or if the condition fails to improve as anticipated.  I provided  90minutes of non-face-to-face time during this encounter.  LISA Hannah Beat, MD  Primary Care at [facility] 05-28-18

## 2018-05-28 NOTE — Progress Notes (Signed)
Pt states he had been having episodes of nose bleeding everytime he blow his nose. He states he is a Chemo pt and was concerned. Pt states this has been going on since Saturday. No other symptoms at this time.

## 2018-05-28 NOTE — Telephone Encounter (Signed)
Pt notified. He said his ENT recommended nasocort. "  I am having violent sneezes 6-7 times/day and today there is blood coming from my left nostril with each sneeze.It is a small amount. I have had this before I started my chemo". PT stated h e was going to f/u with his PCP.

## 2018-05-28 NOTE — Telephone Encounter (Signed)
Pt has telemend today with Dr. Holly Bodily 05/28/2018@ 2:20pm

## 2018-05-28 NOTE — Telephone Encounter (Addendum)
Patient with intermittent nose bleeds since Sunday. Occurs with sneezing. Denies lightheadedness/weakness. Usually stops bleeding within 30 minutes. Has not used any nasal sprays like nasonex or nasocort/ice packs/pinching to try to stop the bleeding when it occurs.denies blood tinged urine. Last week he experienced hard BM and had minimal blood with straining but resolved quickly.  Reviewed those interventions with patient. If nosebleeds continues or dizziness occurs, faint-feeling, he will call back. Takes plavix and currently receives radiation. Disposition is a visit within 24 hours due to the plavix. Routing to PCP for any further advice from physician at this time. He can do a virtual visit via smart phone [phone]. Reason for Disposition . Taking Coumadin (warfarin) or other strong blood thinner, or known bleeding disorder (e.g., thrombocytopenia)  Answer Assessment - Initial Assessment Questions 1. AMOUNT OF BLEEDING: "How bad is the bleeding?" "How much blood was lost?" "Has the bleeding stopped?"   - MILD: needed a couple tissues   - MODERATE: needed many tissues   - SEVERE: large blood clots, soaked many tissues, lasted more than 30 minutes      A couple of tissues. Some stringy mucous and blood mixed. 2. ONSET: "When did the nosebleed start?"      About 3 days ago, intermittently  3. FREQUENCY: "How many nosebleeds have you had in the last 24 hours?"      5-7 occurs with sneezing 4. RECURRENT SYMPTOMS: "Have there been other recent nosebleeds?" If so, ask: "How long did it take you to stop the bleeding?" "What worked best?"      yes 5. CAUSE: "What do you think caused this nosebleed?"     Maybe allergies 6. LOCAL FACTORS: "Do you have any cold symptoms?", "Have you been rubbing or picking at your nose?"     no 7. SYSTEMIC FACTORS: "Do you have high blood pressure or any bleeding problems?"     no 8. BLOOD THINNERS: "Do you take any blood thinners?" (e.g., coumadin, heparin,  aspirin, Plavix)     plavix 9. OTHER SYMPTOMS: "Do you have any other symptoms?" (e.g., lightheadedness)     No but reports chronic sinusitis 10. PREGNANCY: "Is there any chance you are pregnant?" "When was your last menstrual period?"       na  Protocols used: [facility]

## 2018-05-29 ENCOUNTER — Ambulatory Visit
Admission: RE | Admit: 2018-05-29 | Discharge: 2018-05-29 | Disposition: A | Discharge status: Home / Self Care | Payer: Medicare Other | Source: Ambulatory Visit | Attending: Radiation Oncology | Admitting: Radiation Oncology

## 2018-05-29 ENCOUNTER — Other Ambulatory Visit: Payer: Self-pay

## 2018-05-29 DIAGNOSIS — C3412 Malignant neoplasm of upper lobe, left bronchus or lung: Secondary | ICD-10-CM | POA: Diagnosis not present

## 2018-05-29 DIAGNOSIS — I739 Peripheral vascular disease, unspecified: Secondary | ICD-10-CM | POA: Diagnosis not present

## 2018-05-29 DIAGNOSIS — Z510 Encounter for antineoplastic radiation therapy: Secondary | ICD-10-CM | POA: Diagnosis not present

## 2018-05-29 DIAGNOSIS — J439 Emphysema, unspecified: Secondary | ICD-10-CM | POA: Diagnosis not present

## 2018-05-29 DIAGNOSIS — I2510 Atherosclerotic heart disease of native coronary artery without angina pectoris: Secondary | ICD-10-CM | POA: Diagnosis not present

## 2018-05-29 DIAGNOSIS — I10 Essential (primary) hypertension: Secondary | ICD-10-CM | POA: Diagnosis not present

## 2018-05-29 DIAGNOSIS — C3402 Malignant neoplasm of left main bronchus: Secondary | ICD-10-CM | POA: Diagnosis not present

## 2018-05-30 ENCOUNTER — Other Ambulatory Visit: Payer: Self-pay

## 2018-05-30 ENCOUNTER — Ambulatory Visit: Payer: Medicare Other

## 2018-05-30 ENCOUNTER — Ambulatory Visit
Admission: RE | Admit: 2018-05-30 | Discharge: 2018-05-30 | Disposition: A | Discharge status: Home / Self Care | Payer: Medicare Other | Source: Ambulatory Visit | Attending: Radiation Oncology | Admitting: Radiation Oncology

## 2018-05-30 DIAGNOSIS — C3412 Malignant neoplasm of upper lobe, left bronchus or lung: Secondary | ICD-10-CM | POA: Diagnosis not present

## 2018-05-30 DIAGNOSIS — Z510 Encounter for antineoplastic radiation therapy: Secondary | ICD-10-CM | POA: Diagnosis not present

## 2018-05-30 DIAGNOSIS — I2510 Atherosclerotic heart disease of native coronary artery without angina pectoris: Secondary | ICD-10-CM | POA: Diagnosis not present

## 2018-05-30 DIAGNOSIS — I10 Essential (primary) hypertension: Secondary | ICD-10-CM | POA: Diagnosis not present

## 2018-05-30 DIAGNOSIS — C3402 Malignant neoplasm of left main bronchus: Secondary | ICD-10-CM | POA: Diagnosis not present

## 2018-05-30 DIAGNOSIS — J439 Emphysema, unspecified: Secondary | ICD-10-CM | POA: Diagnosis not present

## 2018-05-30 DIAGNOSIS — I739 Peripheral vascular disease, unspecified: Secondary | ICD-10-CM | POA: Diagnosis not present

## 2018-05-30 NOTE — Progress Notes (Signed)
Note reviewed, and agree with documentation and plan. Signed,   [name], MD Primary Care at [facility].  05/30/18 12:52 PM

## 2018-06-02 ENCOUNTER — Inpatient Hospital Stay: Payer: Medicare Other

## 2018-06-02 ENCOUNTER — Other Ambulatory Visit: Payer: Self-pay

## 2018-06-02 ENCOUNTER — Inpatient Hospital Stay (HOSPITAL_BASED_OUTPATIENT_CLINIC_OR_DEPARTMENT_OTHER): Payer: Medicare Other | Admitting: Medical

## 2018-06-02 ENCOUNTER — Ambulatory Visit
Admission: RE | Admit: 2018-06-02 | Discharge: 2018-06-02 | Disposition: A | Discharge status: Home / Self Care | Payer: Medicare Other | Source: Ambulatory Visit | Attending: Radiation Oncology | Admitting: Radiation Oncology

## 2018-06-02 VITALS — BP 100/63 | HR 76 | Temp 97.7°F | Resp 18

## 2018-06-02 DIAGNOSIS — Z5111 Encounter for antineoplastic chemotherapy: Secondary | ICD-10-CM | POA: Diagnosis not present

## 2018-06-02 DIAGNOSIS — R197 Diarrhea, unspecified: Secondary | ICD-10-CM | POA: Diagnosis not present

## 2018-06-02 DIAGNOSIS — M25521 Pain in right elbow: Secondary | ICD-10-CM | POA: Diagnosis not present

## 2018-06-02 DIAGNOSIS — C3492 Malignant neoplasm of unspecified part of left bronchus or lung: Secondary | ICD-10-CM

## 2018-06-02 DIAGNOSIS — J439 Emphysema, unspecified: Secondary | ICD-10-CM | POA: Diagnosis not present

## 2018-06-02 DIAGNOSIS — C3411 Malignant neoplasm of upper lobe, right bronchus or lung: Secondary | ICD-10-CM

## 2018-06-02 DIAGNOSIS — R634 Abnormal weight loss: Secondary | ICD-10-CM | POA: Diagnosis not present

## 2018-06-02 DIAGNOSIS — I2510 Atherosclerotic heart disease of native coronary artery without angina pectoris: Secondary | ICD-10-CM | POA: Diagnosis not present

## 2018-06-02 DIAGNOSIS — C3412 Malignant neoplasm of upper lobe, left bronchus or lung: Secondary | ICD-10-CM | POA: Diagnosis not present

## 2018-06-02 DIAGNOSIS — C3402 Malignant neoplasm of left main bronchus: Secondary | ICD-10-CM | POA: Diagnosis not present

## 2018-06-02 DIAGNOSIS — R232 Flushing: Secondary | ICD-10-CM | POA: Diagnosis not present

## 2018-06-02 DIAGNOSIS — I10 Essential (primary) hypertension: Secondary | ICD-10-CM | POA: Diagnosis not present

## 2018-06-02 DIAGNOSIS — I739 Peripheral vascular disease, unspecified: Secondary | ICD-10-CM | POA: Diagnosis not present

## 2018-06-02 DIAGNOSIS — Z510 Encounter for antineoplastic radiation therapy: Secondary | ICD-10-CM | POA: Diagnosis not present

## 2018-06-02 LAB — CBC WITH DIFFERENTIAL (CANCER CENTER ONLY)
Abs Immature Granulocytes: 0.04 10*3/uL (ref 0.00–0.07)
Basophils Absolute: 0 10*3/uL (ref 0.0–0.1)
Basophils Relative: 1 %
Eosinophils Absolute: 0 10*3/uL (ref 0.0–0.5)
Eosinophils Relative: 1 %
HCT: 39.3 % (ref 39.0–52.0)
Hemoglobin: 13 g/dL (ref 13.0–17.0)
Immature Granulocytes: 1 %
Lymphocytes Relative: 20 %
Lymphs Abs: 0.8 10*3/uL (ref 0.7–4.0)
MCH: 30.5 pg (ref 26.0–34.0)
MCHC: 33.1 g/dL (ref 30.0–36.0)
MCV: 92.3 fL (ref 80.0–100.0)
Monocytes Absolute: 0.4 10*3/uL (ref 0.1–1.0)
Monocytes Relative: 10 %
Neutro Abs: 2.5 10*3/uL (ref 1.7–7.7)
Neutrophils Relative %: 67 %
Platelet Count: 137 10*3/uL — ABNORMAL LOW (ref 150–400)
RBC: 4.26 MIL/uL (ref 4.22–5.81)
RDW: 14.5 % (ref 11.5–15.5)
WBC Count: 3.8 10*3/uL — ABNORMAL LOW (ref 4.0–10.5)
nRBC: 0 % (ref 0.0–0.2)

## 2018-06-02 LAB — CMP (CANCER CENTER ONLY)
ALT: 15 U/L (ref 0–44)
AST: 12 U/L — ABNORMAL LOW (ref 15–41)
Albumin: 3.6 g/dL (ref 3.5–5.0)
Alkaline Phosphatase: 108 U/L (ref 38–126)
Anion gap: 8 (ref 5–15)
BUN: 14 mg/dL (ref 8–23)
CO2: 22 mmol/L (ref 22–32)
Calcium: 8.5 mg/dL — ABNORMAL LOW (ref 8.9–10.3)
Chloride: 105 mmol/L (ref 98–111)
Creatinine: 1.06 mg/dL (ref 0.61–1.24)
GFR, Est AFR Am: >60 mL/min (ref >60)
GFR, Estimated: >60 mL/min (ref >60)
Glucose, Bld: 99 mg/dL (ref 70–99)
Potassium: 4.3 mmol/L (ref 3.5–5.1)
Sodium: 135 mmol/L (ref 135–145)
Total Bilirubin: 0.8 mg/dL (ref 0.3–1.2)
Total Protein: 6.3 g/dL — ABNORMAL LOW (ref 6.5–8.1)

## 2018-06-02 MED ORDER — SODIUM CHLORIDE 0.9 % IV SOLN
45.0000 mg/m2 | Freq: Once | INTRAVENOUS | Status: AC
  Administered 2018-06-02: 13:00:00 102 mg via INTRAVENOUS
  Filled 2018-06-02: qty 17

## 2018-06-02 MED ORDER — COLD PACK MISC ONCOLOGY
1.0000 | Freq: Once | Status: DC | PRN
  Filled 2018-06-02: qty 1

## 2018-06-02 MED ORDER — DIPHENHYDRAMINE HCL 50 MG/ML IJ SOLN
INTRAMUSCULAR | Status: AC
  Filled 2018-06-02: qty 1

## 2018-06-02 MED ORDER — PALONOSETRON HCL INJECTION 0.25 MG/5ML
INTRAVENOUS | Status: AC
  Filled 2018-06-02: qty 5

## 2018-06-02 MED ORDER — FAMOTIDINE IN NACL 20-0.9 MG/50ML-% IV SOLN
20.0000 mg | Freq: Once | INTRAVENOUS | Status: AC
  Administered 2018-06-02: 20 mg via INTRAVENOUS

## 2018-06-02 MED ORDER — FAMOTIDINE IN NACL 20-0.9 MG/50ML-% IV SOLN
INTRAVENOUS | Status: AC
  Filled 2018-06-02: qty 50

## 2018-06-02 MED ORDER — SODIUM CHLORIDE 0.9 % IV SOLN
222.6000 mg | Freq: Once | INTRAVENOUS | Status: AC
  Administered 2018-06-02: 220 mg via INTRAVENOUS
  Filled 2018-06-02: qty 22

## 2018-06-02 MED ORDER — PALONOSETRON HCL INJECTION 0.25 MG/5ML
0.2500 mg | Freq: Once | INTRAVENOUS | Status: AC
  Administered 2018-06-02: 0.25 mg via INTRAVENOUS

## 2018-06-02 MED ORDER — DIPHENHYDRAMINE HCL 50 MG/ML IJ SOLN
50.0000 mg | Freq: Once | INTRAMUSCULAR | Status: AC
  Administered 2018-06-02: 50 mg via INTRAVENOUS

## 2018-06-02 MED ORDER — SODIUM CHLORIDE 0.9 % IV SOLN
Freq: Once | INTRAVENOUS | Status: AC
  Administered 2018-06-02: 12:00:00 via INTRAVENOUS
  Filled 2018-06-02: qty 250

## 2018-06-02 NOTE — Progress Notes (Signed)
Pt c/o "burning" to right arm after starting Taxol infusion. Taxol paused and right PIV flushed with normal saline. PIV noted to be patent and flushed without difficulty. Pt would jump and grimace with normal saline flush. Blood return noted from PIV. Pt c/o tenderness to touch to left of PIV site. Mild redness noted above PIV site. Tim, RN started new PIV in left arm without difficulty and taxol infusion restarted in left PIV.  Lucianne Lei, PA at bedside to evaluate PIV and skin; no new orders received. Cold pack placed on left PIV site after removal of PIV for pt comfort.  Upon discharge pt denied any pain to right forearm. Pt stated "It may have been the way my arm was laying" No swelling or redness noted to left forearm. Pt aware to call with any questions or concerns.

## 2018-06-02 NOTE — Progress Notes (Signed)
Seth [name] was seen in the infusion room today.  He had an IV placed in his right anterior forearm.  There was good blood return and patent flushing of the site.  The patient reported that he felt pain in his right medial elbow when his IV site was flushed.  He had been receiving he was receiving Taxol.  The area were the patient reported having pain was 12 cm proximal and 4 cm medial to his IV insertion site.  The area was a slight area of diffuse fullness over the right medial elbow.  The patient reported this area was tender.  It did not appear that the vasculature in question was associated with the vein in which the patient's IV had been placed.  An IV was placed in the left anterior forearm with the remainder of the patient's Taxol infused without incident.  The right IV site was patent with no induration, erythema, increased warmth, or skin changes that would indicate Taxol extravasation.  The patient was reassured.  He was given my card and told to return as needed.  Sandi Mealy, MHS, PA-C Physician Assistant

## 2018-06-02 NOTE — Patient Instructions (Signed)
[facility] Discharge Instructions for Patients Receiving Chemotherapy  Today you received the following chemotherapy agents:  Taxol, Carboplatin  To help prevent nausea and vomiting after your treatment, we encourage you to take your nausea medication as prescribed.   If you develop nausea and vomiting that is not controlled by your nausea medication, call the clinic.   BELOW ARE SYMPTOMS THAT SHOULD BE REPORTED IMMEDIATELY:  *FEVER GREATER THAN 100.5 F  *CHILLS WITH OR WITHOUT FEVER  NAUSEA AND VOMITING THAT IS NOT CONTROLLED WITH YOUR NAUSEA MEDICATION  *UNUSUAL SHORTNESS OF BREATH  *UNUSUAL BRUISING OR BLEEDING  TENDERNESS IN MOUTH AND THROAT WITH OR WITHOUT PRESENCE OF ULCERS  *URINARY PROBLEMS  *BOWEL PROBLEMS  UNUSUAL RASH Items with * indicate a potential emergency and should be followed up as soon as possible.  Feel free to call the clinic should you have any questions or concerns. The clinic phone number is [phone].  Please show the CHEMO ALERT CARD at check-in to the Emergency Department and triage nurse.   

## 2018-06-03 ENCOUNTER — Ambulatory Visit: Payer: Medicare Other

## 2018-06-03 ENCOUNTER — Ambulatory Visit
Admission: RE | Admit: 2018-06-03 | Discharge: 2018-06-03 | Disposition: A | Discharge status: Home / Self Care | Payer: Medicare Other | Source: Ambulatory Visit | Attending: Radiation Oncology | Admitting: Radiation Oncology

## 2018-06-03 ENCOUNTER — Other Ambulatory Visit: Payer: Self-pay

## 2018-06-03 DIAGNOSIS — C3402 Malignant neoplasm of left main bronchus: Secondary | ICD-10-CM | POA: Diagnosis not present

## 2018-06-03 DIAGNOSIS — Z510 Encounter for antineoplastic radiation therapy: Secondary | ICD-10-CM | POA: Diagnosis not present

## 2018-06-03 DIAGNOSIS — J439 Emphysema, unspecified: Secondary | ICD-10-CM | POA: Diagnosis not present

## 2018-06-03 DIAGNOSIS — I739 Peripheral vascular disease, unspecified: Secondary | ICD-10-CM | POA: Diagnosis not present

## 2018-06-03 DIAGNOSIS — I2510 Atherosclerotic heart disease of native coronary artery without angina pectoris: Secondary | ICD-10-CM | POA: Diagnosis not present

## 2018-06-03 DIAGNOSIS — I10 Essential (primary) hypertension: Secondary | ICD-10-CM | POA: Diagnosis not present

## 2018-06-03 DIAGNOSIS — C3412 Malignant neoplasm of upper lobe, left bronchus or lung: Secondary | ICD-10-CM | POA: Diagnosis not present

## 2018-06-04 ENCOUNTER — Other Ambulatory Visit: Payer: Self-pay

## 2018-06-04 ENCOUNTER — Ambulatory Visit
Admission: RE | Admit: 2018-06-04 | Discharge: 2018-06-04 | Disposition: A | Discharge status: Home / Self Care | Payer: Medicare Other | Source: Ambulatory Visit | Attending: Radiation Oncology | Admitting: Radiation Oncology

## 2018-06-04 DIAGNOSIS — I10 Essential (primary) hypertension: Secondary | ICD-10-CM | POA: Diagnosis not present

## 2018-06-04 DIAGNOSIS — J439 Emphysema, unspecified: Secondary | ICD-10-CM | POA: Diagnosis not present

## 2018-06-04 DIAGNOSIS — Z510 Encounter for antineoplastic radiation therapy: Secondary | ICD-10-CM | POA: Diagnosis not present

## 2018-06-04 DIAGNOSIS — C3412 Malignant neoplasm of upper lobe, left bronchus or lung: Secondary | ICD-10-CM | POA: Diagnosis not present

## 2018-06-04 DIAGNOSIS — I739 Peripheral vascular disease, unspecified: Secondary | ICD-10-CM | POA: Diagnosis not present

## 2018-06-04 DIAGNOSIS — C3402 Malignant neoplasm of left main bronchus: Secondary | ICD-10-CM | POA: Diagnosis not present

## 2018-06-04 DIAGNOSIS — I2510 Atherosclerotic heart disease of native coronary artery without angina pectoris: Secondary | ICD-10-CM | POA: Diagnosis not present

## 2018-06-05 ENCOUNTER — Telehealth: Payer: Self-pay | Admitting: Medical Oncology

## 2018-06-05 ENCOUNTER — Other Ambulatory Visit: Payer: Self-pay

## 2018-06-05 ENCOUNTER — Ambulatory Visit
Admission: RE | Admit: 2018-06-05 | Discharge: 2018-06-05 | Disposition: A | Discharge status: Home / Self Care | Payer: Medicare Other | Source: Ambulatory Visit | Attending: Radiation Oncology | Admitting: Radiation Oncology

## 2018-06-05 DIAGNOSIS — J439 Emphysema, unspecified: Secondary | ICD-10-CM | POA: Diagnosis not present

## 2018-06-05 DIAGNOSIS — C3402 Malignant neoplasm of left main bronchus: Secondary | ICD-10-CM | POA: Diagnosis not present

## 2018-06-05 DIAGNOSIS — Z510 Encounter for antineoplastic radiation therapy: Secondary | ICD-10-CM | POA: Diagnosis not present

## 2018-06-05 DIAGNOSIS — I2510 Atherosclerotic heart disease of native coronary artery without angina pectoris: Secondary | ICD-10-CM | POA: Diagnosis not present

## 2018-06-05 DIAGNOSIS — I739 Peripheral vascular disease, unspecified: Secondary | ICD-10-CM | POA: Diagnosis not present

## 2018-06-05 DIAGNOSIS — I10 Essential (primary) hypertension: Secondary | ICD-10-CM | POA: Diagnosis not present

## 2018-06-05 DIAGNOSIS — C3412 Malignant neoplasm of upper lobe, left bronchus or lung: Secondary | ICD-10-CM | POA: Diagnosis not present

## 2018-06-05 NOTE — Telephone Encounter (Addendum)
XRT told pt to call re "rash " they noted on his abdomen. Pt was not aware he had a "rash". He denies itching, redness, heat ,not raised, no pain. "It does not bother me at all". He said he will email picture. Photo retrieved and cc Mohamed. Per Julien Nordmann I told pt to monitor rash for now.

## 2018-06-06 ENCOUNTER — Ambulatory Visit
Admission: RE | Admit: 2018-06-06 | Discharge: 2018-06-06 | Disposition: A | Discharge status: Home / Self Care | Payer: Medicare Other | Source: Ambulatory Visit | Attending: Radiation Oncology | Admitting: Radiation Oncology

## 2018-06-06 DIAGNOSIS — J309 Allergic rhinitis, unspecified: Secondary | ICD-10-CM | POA: Insufficient documentation

## 2018-06-06 DIAGNOSIS — C3412 Malignant neoplasm of upper lobe, left bronchus or lung: Secondary | ICD-10-CM | POA: Diagnosis not present

## 2018-06-06 DIAGNOSIS — I2510 Atherosclerotic heart disease of native coronary artery without angina pectoris: Secondary | ICD-10-CM | POA: Diagnosis not present

## 2018-06-06 DIAGNOSIS — I10 Essential (primary) hypertension: Secondary | ICD-10-CM | POA: Insufficient documentation

## 2018-06-06 DIAGNOSIS — Z7982 Long term (current) use of aspirin: Secondary | ICD-10-CM | POA: Insufficient documentation

## 2018-06-06 DIAGNOSIS — K219 Gastro-esophageal reflux disease without esophagitis: Secondary | ICD-10-CM | POA: Insufficient documentation

## 2018-06-06 DIAGNOSIS — I739 Peripheral vascular disease, unspecified: Secondary | ICD-10-CM | POA: Insufficient documentation

## 2018-06-06 DIAGNOSIS — Z510 Encounter for antineoplastic radiation therapy: Secondary | ICD-10-CM | POA: Insufficient documentation

## 2018-06-06 DIAGNOSIS — J439 Emphysema, unspecified: Secondary | ICD-10-CM | POA: Diagnosis not present

## 2018-06-06 DIAGNOSIS — Z79899 Other long term (current) drug therapy: Secondary | ICD-10-CM | POA: Insufficient documentation

## 2018-06-06 DIAGNOSIS — Z7902 Long term (current) use of antithrombotics/antiplatelets: Secondary | ICD-10-CM | POA: Diagnosis not present

## 2018-06-06 DIAGNOSIS — C3402 Malignant neoplasm of left main bronchus: Secondary | ICD-10-CM | POA: Insufficient documentation

## 2018-06-06 DIAGNOSIS — Z87891 Personal history of nicotine dependence: Secondary | ICD-10-CM | POA: Insufficient documentation

## 2018-06-09 ENCOUNTER — Other Ambulatory Visit: Payer: Self-pay

## 2018-06-09 ENCOUNTER — Inpatient Hospital Stay (HOSPITAL_BASED_OUTPATIENT_CLINIC_OR_DEPARTMENT_OTHER): Payer: Medicare Other | Admitting: Internal Medicine

## 2018-06-09 ENCOUNTER — Telehealth: Payer: Self-pay | Admitting: Internal Medicine

## 2018-06-09 ENCOUNTER — Ambulatory Visit
Admission: RE | Admit: 2018-06-09 | Discharge: 2018-06-09 | Disposition: A | Discharge status: Home / Self Care | Payer: Medicare Other | Source: Ambulatory Visit | Attending: Radiation Oncology | Admitting: Radiation Oncology

## 2018-06-09 ENCOUNTER — Encounter: Payer: Self-pay | Admitting: Internal Medicine

## 2018-06-09 ENCOUNTER — Inpatient Hospital Stay: Payer: Medicare Other

## 2018-06-09 ENCOUNTER — Inpatient Hospital Stay: Payer: Medicare Other | Attending: Internal Medicine

## 2018-06-09 VITALS — BP 107/71 | HR 76 | Temp 98.0°F | Resp 18 | Ht 73.0 in | Wt 207.2 lb

## 2018-06-09 DIAGNOSIS — K219 Gastro-esophageal reflux disease without esophagitis: Secondary | ICD-10-CM

## 2018-06-09 DIAGNOSIS — Z5111 Encounter for antineoplastic chemotherapy: Secondary | ICD-10-CM | POA: Diagnosis not present

## 2018-06-09 DIAGNOSIS — Z79899 Other long term (current) drug therapy: Secondary | ICD-10-CM

## 2018-06-09 DIAGNOSIS — C3411 Malignant neoplasm of upper lobe, right bronchus or lung: Secondary | ICD-10-CM

## 2018-06-09 DIAGNOSIS — C3492 Malignant neoplasm of unspecified part of left bronchus or lung: Secondary | ICD-10-CM

## 2018-06-09 DIAGNOSIS — Z7982 Long term (current) use of aspirin: Secondary | ICD-10-CM

## 2018-06-09 DIAGNOSIS — Z510 Encounter for antineoplastic radiation therapy: Secondary | ICD-10-CM | POA: Diagnosis not present

## 2018-06-09 DIAGNOSIS — C3402 Malignant neoplasm of left main bronchus: Secondary | ICD-10-CM | POA: Diagnosis not present

## 2018-06-09 DIAGNOSIS — E785 Hyperlipidemia, unspecified: Secondary | ICD-10-CM

## 2018-06-09 DIAGNOSIS — I10 Essential (primary) hypertension: Secondary | ICD-10-CM | POA: Insufficient documentation

## 2018-06-09 DIAGNOSIS — K5900 Constipation, unspecified: Secondary | ICD-10-CM

## 2018-06-09 DIAGNOSIS — C3412 Malignant neoplasm of upper lobe, left bronchus or lung: Secondary | ICD-10-CM | POA: Diagnosis not present

## 2018-06-09 DIAGNOSIS — I739 Peripheral vascular disease, unspecified: Secondary | ICD-10-CM | POA: Diagnosis not present

## 2018-06-09 DIAGNOSIS — R197 Diarrhea, unspecified: Secondary | ICD-10-CM | POA: Insufficient documentation

## 2018-06-09 DIAGNOSIS — I2510 Atherosclerotic heart disease of native coronary artery without angina pectoris: Secondary | ICD-10-CM | POA: Diagnosis not present

## 2018-06-09 DIAGNOSIS — J439 Emphysema, unspecified: Secondary | ICD-10-CM | POA: Diagnosis not present

## 2018-06-09 DIAGNOSIS — C3490 Malignant neoplasm of unspecified part of unspecified bronchus or lung: Secondary | ICD-10-CM

## 2018-06-09 LAB — CMP (CANCER CENTER ONLY)
ALT: 15 U/L (ref 0–44)
AST: 13 U/L — ABNORMAL LOW (ref 15–41)
Albumin: 3.7 g/dL (ref 3.5–5.0)
Alkaline Phosphatase: 106 U/L (ref 38–126)
Anion gap: 7 (ref 5–15)
BUN: 11 mg/dL (ref 8–23)
CO2: 23 mmol/L (ref 22–32)
Calcium: 8.5 mg/dL — ABNORMAL LOW (ref 8.9–10.3)
Chloride: 105 mmol/L (ref 98–111)
Creatinine: 1.06 mg/dL (ref 0.61–1.24)
GFR, Est AFR Am: >60 mL/min (ref >60)
GFR, Estimated: >60 mL/min (ref >60)
Glucose, Bld: 102 mg/dL — ABNORMAL HIGH (ref 70–99)
Potassium: 4.1 mmol/L (ref 3.5–5.1)
Sodium: 135 mmol/L (ref 135–145)
Total Bilirubin: 0.8 mg/dL (ref 0.3–1.2)
Total Protein: 6.5 g/dL (ref 6.5–8.1)

## 2018-06-09 LAB — CBC WITH DIFFERENTIAL (CANCER CENTER ONLY)
Abs Immature Granulocytes: 0.03 10*3/uL (ref 0.00–0.07)
Basophils Absolute: 0 10*3/uL (ref 0.0–0.1)
Basophils Relative: 1 %
Eosinophils Absolute: 0 10*3/uL (ref 0.0–0.5)
Eosinophils Relative: 1 %
HCT: 36.2 % — ABNORMAL LOW (ref 39.0–52.0)
Hemoglobin: 12.4 g/dL — ABNORMAL LOW (ref 13.0–17.0)
Immature Granulocytes: 1 %
Lymphocytes Relative: 17 %
Lymphs Abs: 0.7 10*3/uL (ref 0.7–4.0)
MCH: 31.5 pg (ref 26.0–34.0)
MCHC: 34.3 g/dL (ref 30.0–36.0)
MCV: 91.9 fL (ref 80.0–100.0)
Monocytes Absolute: 0.3 10*3/uL (ref 0.1–1.0)
Monocytes Relative: 8 %
Neutro Abs: 2.7 10*3/uL (ref 1.7–7.7)
Neutrophils Relative %: 72 %
Platelet Count: 118 10*3/uL — ABNORMAL LOW (ref 150–400)
RBC: 3.94 MIL/uL — ABNORMAL LOW (ref 4.22–5.81)
RDW: 14.9 % (ref 11.5–15.5)
WBC Count: 3.8 10*3/uL — ABNORMAL LOW (ref 4.0–10.5)
nRBC: 0 % (ref 0.0–0.2)

## 2018-06-09 MED ORDER — DIPHENHYDRAMINE HCL 50 MG/ML IJ SOLN
INTRAMUSCULAR | Status: AC
  Filled 2018-06-09: qty 1

## 2018-06-09 MED ORDER — SODIUM CHLORIDE 0.9 % IV SOLN
45.0000 mg/m2 | Freq: Once | INTRAVENOUS | Status: AC
  Administered 2018-06-09: 13:00:00 102 mg via INTRAVENOUS
  Filled 2018-06-09: qty 17

## 2018-06-09 MED ORDER — DIPHENHYDRAMINE HCL 50 MG/ML IJ SOLN
50.0000 mg | Freq: Once | INTRAMUSCULAR | Status: AC
  Administered 2018-06-09: 11:00:00 50 mg via INTRAVENOUS

## 2018-06-09 MED ORDER — SODIUM CHLORIDE 0.9 % IV SOLN
222.6000 mg | Freq: Once | INTRAVENOUS | Status: AC
  Administered 2018-06-09: 220 mg via INTRAVENOUS
  Filled 2018-06-09: qty 22

## 2018-06-09 MED ORDER — PALONOSETRON HCL INJECTION 0.25 MG/5ML
0.2500 mg | Freq: Once | INTRAVENOUS | Status: AC
  Administered 2018-06-09: 11:00:00 0.25 mg via INTRAVENOUS

## 2018-06-09 MED ORDER — FAMOTIDINE IN NACL 20-0.9 MG/50ML-% IV SOLN
INTRAVENOUS | Status: AC
  Filled 2018-06-09: qty 50

## 2018-06-09 MED ORDER — FAMOTIDINE IN NACL 20-0.9 MG/50ML-% IV SOLN
20.0000 mg | Freq: Once | INTRAVENOUS | Status: AC
  Administered 2018-06-09: 20 mg via INTRAVENOUS

## 2018-06-09 MED ORDER — SODIUM CHLORIDE 0.9 % IV SOLN
Freq: Once | INTRAVENOUS | Status: AC
  Administered 2018-06-09: 11:00:00 via INTRAVENOUS
  Filled 2018-06-09: qty 250

## 2018-06-09 MED ORDER — PALONOSETRON HCL INJECTION 0.25 MG/5ML
INTRAVENOUS | Status: AC
  Filled 2018-06-09: qty 5

## 2018-06-09 NOTE — Patient Instructions (Signed)
[facility] Discharge Instructions for Patients Receiving Chemotherapy  Today you received the following chemotherapy agents:  Taxol, Carboplatin  To help prevent nausea and vomiting after your treatment, we encourage you to take your nausea medication as prescribed.   If you develop nausea and vomiting that is not controlled by your nausea medication, call the clinic.   BELOW ARE SYMPTOMS THAT SHOULD BE REPORTED IMMEDIATELY:  *FEVER GREATER THAN 100.5 F  *CHILLS WITH OR WITHOUT FEVER  NAUSEA AND VOMITING THAT IS NOT CONTROLLED WITH YOUR NAUSEA MEDICATION  *UNUSUAL SHORTNESS OF BREATH  *UNUSUAL BRUISING OR BLEEDING  TENDERNESS IN MOUTH AND THROAT WITH OR WITHOUT PRESENCE OF ULCERS  *URINARY PROBLEMS  *BOWEL PROBLEMS  UNUSUAL RASH Items with * indicate a potential emergency and should be followed up as soon as possible.  Feel free to call the clinic should you have any questions or concerns. The clinic phone number is [phone].  Please show the CHEMO ALERT CARD at check-in to the Emergency Department and triage nurse.   

## 2018-06-09 NOTE — Telephone Encounter (Signed)
Scheduled appt per 5/4 los - pt to get an updated schedule with appt date and time next visit.

## 2018-06-09 NOTE — Progress Notes (Signed)
[place] [phone]   [phone]  OFFICE PROGRESS NOTE  S[place] Agreste, [name] [id number]  DIAGNOSIS: Stage IIB (T1c, N1, M0) non-small cell lung cancer favoring adenocarcinoma presented with right upper lobe lung nodule in addition to right hilar lymphadenopathy diagnosed in February 2020.  The patient is not a good surgical candidate for resection.  PRIOR THERAPY: None  CURRENT THERAPY: Concurrent chemoradiation with weekly carboplatin for AUC of 2 and paclitaxel 45 mg/M2.  First dose May 05, 2018.  Status post 5 cycles.  INTERVAL HISTORY: Seth [name] 73 y.o. male returns to the clinic today for follow-up visit.  The patient is feeling fine today with no concerning complaints except for intermittent diarrhea and constipation.  He denied having any significant dysphagia or odynophagia.  He denied having any weight loss or night sweats.  He has no chest pain, shortness of breath, cough or hemoptysis.  He denied having any fever or chills.  He continues to tolerate his course of concurrent chemoradiation fairly well.  He is here today for evaluation before starting cycle #6.  MEDICAL HISTORY: Past Medical History:  Diagnosis Date  . AAA (abdominal aortic aneurysm) ([place])   . Allergic rhinitis   . Anxiety   . Back pain   . CAD (coronary artery disease)   . Cataracts, bilateral   . Emphysema, unspecified ([place])     " moderate"  . Essential hypertension, benign   . GERD (gastroesophageal reflux disease)   . Gout   . Headache    migraines  . Heart murmur    as a child only  . Hilar adenopathy   . History of hiatal hernia   . History of sciatica   . Hyperlipidemia   . Lung nodule   . Pneumonia    as child  . Prostate hypertrophy   . PVD (peripheral vascular disease) ([place])   . Seasonal allergies   . Subclavian steal syndrome of left subclavian artery   . Wears dentures   . Wears dentures     ALLERGIES:   is allergic to biaxin [clarithromycin]; clarithromycin; iodinated diagnostic agents; prednisone; cortisone; flomax [tamsulosin hcl]; hepatitis b virus vaccines; and iodine.  MEDICATIONS:  Current Outpatient Medications  Medication Sig Dispense Refill  . aspirin EC 81 MG tablet Take 81 mg by mouth daily as needed for mild pain.    Marland Kitchen atorvastatin (LIPITOR) 40 MG tablet Take 1 tablet (40 mg total) by mouth daily. 90 tablet 3  . cetirizine (ZYRTEC) 10 MG tablet Take 10 mg by mouth daily at 12 noon.    . clobetasol cream (TEMOVATE) 9.38 % Apply 1 application topically 2 (two) times daily as needed (rash/skin irritation.).     Marland Kitchen clopidogrel (PLAVIX) 75 MG tablet Take 1 tablet (75 mg total) by mouth daily. 90 tablet 2  . clotrimazole (LOTRIMIN) 1 % cream Apply 1 application topically 2 (two) times daily. To affected area on groin. Use for 2 weeks, or at least 2 days after resolution of rash (Patient taking differently: Apply 1 application topically 2 (two) times daily as needed (rash/fungal infection.). To affected area on groin. Use for 2 weeks, or at least 2 days after resolution of rash) 30 g 0  . colchicine 0.6 MG tablet TAKE 1 TABLET BY MOUTH TWICE A DAY 180 tablet 1  . diphenhydrAMINE (BENADRYL) 25 MG tablet Take 25 mg by mouth every 6 (six) hours as needed.    Marland Kitchen  famotidine (PEPCID) 20 MG tablet Take 20 mg by mouth 2 (two) times daily. Noon & in the evening.    Marland Kitchen lisinopril (PRINIVIL,ZESTRIL) 10 MG tablet TAKE 1 TABLET BY MOUTH EVERY DAY 90 tablet 1  . lisinopril (PRINIVIL,ZESTRIL) 5 MG tablet TAKE 1 TABLET BY MOUTH EVERY DAY 90 tablet 0  . loperamide (IMODIUM) 2 MG capsule Take 4 mg by mouth as needed for diarrhea or loose stools.    . metoprolol tartrate (LOPRESSOR) 50 MG tablet TAKE 1.5 TABLETS (75 MG TOTAL) BY MOUTH 2 (TWO) TIMES DAILY. 270 tablet 0  . prochlorperazine (COMPAZINE) 10 MG tablet Take 1 tablet (10 mg total) by mouth every 6 (six) hours as needed for nausea or vomiting. 30 tablet 0   . sucralfate (CARAFATE) 1 g tablet Take 1 tablet (1 g total) by mouth 4 (four) times daily. 120 tablet 2  . Triamcinolone Acetonide (NASACORT AQ NA) Place 1 spray into the nose daily at 2 am. (0300 or 0400)    . triamcinolone cream (KENALOG) 0.1 % Apply 1 application topically 2 (two) times daily as needed. (Patient taking differently: Apply 1 application topically 2 (two) times daily as needed (rash/skin irritation.). ) 30 g 0   Current Facility-Administered Medications  Medication Dose Route Frequency Provider Last Rate Last Dose  . 0.9 %  sodium chloride infusion  500 mL Intravenous Once Irene Shipper, MD        SURGICAL HISTORY:  Past Surgical History:  Procedure Laterality Date  . CARDIAC CATHETERIZATION     stent placement  . COLONOSCOPY W/ BIOPSIES AND POLYPECTOMY    . ILIAC ARTERY STENT    . MULTIPLE TOOTH EXTRACTIONS    . VIDEO BRONCHOSCOPY WITH ENDOBRONCHIAL NAVIGATION N/A 02/26/2018   Procedure: VIDEO BRONCHOSCOPY WITH ENDOBRONCHIAL NAVIGATION;  Surgeon: Garner Nash, DO;  Location: [facility];  Service: Thoracic;  Laterality: N/A;  . VIDEO BRONCHOSCOPY WITH ENDOBRONCHIAL NAVIGATION N/A 03/19/2018   Procedure: VIDEO BRONCHOSCOPY WITH ENDOBRONCHIAL NAVIGATION and endobronchial ultrasound;  Surgeon: Garner Nash, DO;  Location: [facility];  Service: Thoracic;  Laterality: N/A;  . VIDEO BRONCHOSCOPY WITH ENDOBRONCHIAL ULTRASOUND N/A 02/26/2018   Procedure: VIDEO BRONCHOSCOPY WITH ENDOBRONCHIAL ULTRASOUND;  Surgeon: Garner Nash, DO;  Location: [facility];  Service: Thoracic;  Laterality: N/A;  . VIDEO BRONCHOSCOPY WITH ENDOBRONCHIAL ULTRASOUND N/A 03/19/2018   Procedure: NAVIGATION BRONCHOSCOPY;  Surgeon: Garner Nash, DO;  Location: [facility];  Service: Thoracic;  Laterality: N/A;    REVIEW OF SYSTEMS:  A comprehensive review of systems was negative except for: Gastrointestinal: positive for constipation and diarrhea   PHYSICAL EXAMINATION: General appearance: alert, cooperative and  no distress Head: Normocephalic, without obvious abnormality, atraumatic Neck: no adenopathy, no JVD, supple, symmetrical, trachea midline and thyroid not enlarged, symmetric, no tenderness/mass/nodules Lymph nodes: Cervical, supraclavicular, and axillary nodes normal. Resp: clear to auscultation bilaterally Back: symmetric, no curvature. ROM normal. No CVA tenderness. Cardio: regular rate and rhythm, S1, S2 normal, no murmur, click, rub or gallop GI: soft, non-tender; bowel sounds normal; no masses,  no organomegaly Extremities: extremities normal, atraumatic, no cyanosis or edema  ECOG PERFORMANCE STATUS: 1 - Symptomatic but completely ambulatory  Blood pressure 107/71, pulse 76, temperature 98 F (36.7 C), temperature source Oral, resp. rate 18, height 6\' 1"  (1.854 m), weight 207 lb 3.2 oz (94 kg), SpO2 100 %.  LABORATORY DATA: Lab Results  Component Value Date   WBC 3.8 (L) 06/09/2018   HGB 12.4 (L) 06/09/2018   HCT 36.2 (  L) 06/09/2018   MCV 91.9 06/09/2018   PLT 118 (L) 06/09/2018      Chemistry      Component Value Date/Time   NA 135 06/02/2018 1046   NA 140 04/28/2018 1647   K 4.3 06/02/2018 1046   CL 105 06/02/2018 1046   CO2 22 06/02/2018 1046   BUN 14 06/02/2018 1046   BUN 9 04/28/2018 1647   CREATININE 1.06 06/02/2018 1046   CREATININE 1.09 03/23/2015 0928      Component Value Date/Time   CALCIUM 8.5 (L) 06/02/2018 1046   ALKPHOS 108 06/02/2018 1046   AST 12 (L) 06/02/2018 1046   ALT 15 06/02/2018 1046   BILITOT 0.8 06/02/2018 1046       RADIOGRAPHIC STUDIES: No results found.  ASSESSMENT AND PLAN: This is a very pleasant 73 years old white male recently diagnosed with unresectable stage IIb non-small cell lung cancer, adenocarcinoma presented with right upper lobe lung nodule in addition to right hilar lymphadenopathy diagnosed in February 2020.  The patient is now a good candidate for surgical resection. The patient is currently on treatment with  concurrent chemoradiation with weekly carboplatin and paclitaxel status post 5 cycles. The patient continues to tolerate this treatment well with no concerning adverse effects. I recommended for him to proceed with cycle #6 today as scheduled. I will see him back for follow-up visit in 5 weeks for evaluation with repeat CT scan of the chest for restaging of his disease. The patient was advised to call immediately if he has any concerning symptoms in the interval. The patient voices understanding of current disease status and treatment options and is in agreement with the current care plan. All questions were answered. The patient knows to call the clinic with any problems, questions or concerns. We can certainly see the patient much sooner if necessary. I spent 10 minutes counseling the patient face to face. The total time spent in the appointment was 15 minutes.  Disclaimer: This note was dictated with voice recognition software. Similar sounding words can inadvertently be transcribed and may not be corrected upon review.

## 2018-06-10 ENCOUNTER — Other Ambulatory Visit: Payer: Self-pay

## 2018-06-10 ENCOUNTER — Ambulatory Visit
Admission: RE | Admit: 2018-06-10 | Discharge: 2018-06-10 | Disposition: A | Discharge status: Home / Self Care | Payer: Medicare Other | Source: Ambulatory Visit | Attending: Radiation Oncology | Admitting: Radiation Oncology

## 2018-06-10 DIAGNOSIS — I10 Essential (primary) hypertension: Secondary | ICD-10-CM | POA: Diagnosis not present

## 2018-06-10 DIAGNOSIS — I2510 Atherosclerotic heart disease of native coronary artery without angina pectoris: Secondary | ICD-10-CM | POA: Diagnosis not present

## 2018-06-10 DIAGNOSIS — C3412 Malignant neoplasm of upper lobe, left bronchus or lung: Secondary | ICD-10-CM | POA: Diagnosis not present

## 2018-06-10 DIAGNOSIS — I739 Peripheral vascular disease, unspecified: Secondary | ICD-10-CM | POA: Diagnosis not present

## 2018-06-10 DIAGNOSIS — J439 Emphysema, unspecified: Secondary | ICD-10-CM | POA: Diagnosis not present

## 2018-06-10 DIAGNOSIS — C3402 Malignant neoplasm of left main bronchus: Secondary | ICD-10-CM | POA: Diagnosis not present

## 2018-06-10 DIAGNOSIS — Z510 Encounter for antineoplastic radiation therapy: Secondary | ICD-10-CM | POA: Diagnosis not present

## 2018-06-11 ENCOUNTER — Ambulatory Visit
Admission: RE | Admit: 2018-06-11 | Discharge: 2018-06-11 | Disposition: A | Discharge status: Home / Self Care | Payer: Medicare Other | Source: Ambulatory Visit | Attending: Radiation Oncology | Admitting: Radiation Oncology

## 2018-06-11 ENCOUNTER — Other Ambulatory Visit: Payer: Self-pay

## 2018-06-11 DIAGNOSIS — C3402 Malignant neoplasm of left main bronchus: Secondary | ICD-10-CM | POA: Diagnosis not present

## 2018-06-11 DIAGNOSIS — J439 Emphysema, unspecified: Secondary | ICD-10-CM | POA: Diagnosis not present

## 2018-06-11 DIAGNOSIS — Z510 Encounter for antineoplastic radiation therapy: Secondary | ICD-10-CM | POA: Diagnosis not present

## 2018-06-11 DIAGNOSIS — I2510 Atherosclerotic heart disease of native coronary artery without angina pectoris: Secondary | ICD-10-CM | POA: Diagnosis not present

## 2018-06-11 DIAGNOSIS — I739 Peripheral vascular disease, unspecified: Secondary | ICD-10-CM | POA: Diagnosis not present

## 2018-06-11 DIAGNOSIS — C3412 Malignant neoplasm of upper lobe, left bronchus or lung: Secondary | ICD-10-CM | POA: Diagnosis not present

## 2018-06-11 DIAGNOSIS — I10 Essential (primary) hypertension: Secondary | ICD-10-CM | POA: Diagnosis not present

## 2018-06-12 ENCOUNTER — Other Ambulatory Visit: Payer: Self-pay

## 2018-06-12 ENCOUNTER — Ambulatory Visit
Admission: RE | Admit: 2018-06-12 | Discharge: 2018-06-12 | Disposition: A | Discharge status: Home / Self Care | Payer: Medicare Other | Source: Ambulatory Visit | Attending: Radiation Oncology | Admitting: Radiation Oncology

## 2018-06-12 ENCOUNTER — Ambulatory Visit: Payer: Medicare Other

## 2018-06-12 DIAGNOSIS — I2510 Atherosclerotic heart disease of native coronary artery without angina pectoris: Secondary | ICD-10-CM | POA: Diagnosis not present

## 2018-06-12 DIAGNOSIS — J439 Emphysema, unspecified: Secondary | ICD-10-CM | POA: Diagnosis not present

## 2018-06-12 DIAGNOSIS — I10 Essential (primary) hypertension: Secondary | ICD-10-CM | POA: Diagnosis not present

## 2018-06-12 DIAGNOSIS — I739 Peripheral vascular disease, unspecified: Secondary | ICD-10-CM | POA: Diagnosis not present

## 2018-06-12 DIAGNOSIS — Z510 Encounter for antineoplastic radiation therapy: Secondary | ICD-10-CM | POA: Diagnosis not present

## 2018-06-12 DIAGNOSIS — C3402 Malignant neoplasm of left main bronchus: Secondary | ICD-10-CM | POA: Diagnosis not present

## 2018-06-12 DIAGNOSIS — C3412 Malignant neoplasm of upper lobe, left bronchus or lung: Secondary | ICD-10-CM | POA: Diagnosis not present

## 2018-06-13 ENCOUNTER — Ambulatory Visit
Admission: RE | Admit: 2018-06-13 | Discharge: 2018-06-13 | Disposition: A | Discharge status: Home / Self Care | Payer: Medicare Other | Source: Ambulatory Visit | Attending: Radiation Oncology | Admitting: Radiation Oncology

## 2018-06-13 ENCOUNTER — Other Ambulatory Visit: Payer: Self-pay

## 2018-06-13 DIAGNOSIS — I739 Peripheral vascular disease, unspecified: Secondary | ICD-10-CM | POA: Diagnosis not present

## 2018-06-13 DIAGNOSIS — C3412 Malignant neoplasm of upper lobe, left bronchus or lung: Secondary | ICD-10-CM | POA: Diagnosis not present

## 2018-06-13 DIAGNOSIS — Z510 Encounter for antineoplastic radiation therapy: Secondary | ICD-10-CM | POA: Diagnosis not present

## 2018-06-13 DIAGNOSIS — J439 Emphysema, unspecified: Secondary | ICD-10-CM | POA: Diagnosis not present

## 2018-06-13 DIAGNOSIS — I10 Essential (primary) hypertension: Secondary | ICD-10-CM | POA: Diagnosis not present

## 2018-06-13 DIAGNOSIS — C3402 Malignant neoplasm of left main bronchus: Secondary | ICD-10-CM | POA: Diagnosis not present

## 2018-06-13 DIAGNOSIS — I2510 Atherosclerotic heart disease of native coronary artery without angina pectoris: Secondary | ICD-10-CM | POA: Diagnosis not present

## 2018-06-16 ENCOUNTER — Inpatient Hospital Stay: Payer: Medicare Other

## 2018-06-16 ENCOUNTER — Ambulatory Visit: Payer: Medicare Other

## 2018-06-16 ENCOUNTER — Ambulatory Visit
Admission: RE | Admit: 2018-06-16 | Discharge: 2018-06-16 | Disposition: A | Discharge status: Home / Self Care | Payer: Medicare Other | Source: Ambulatory Visit | Attending: Radiation Oncology | Admitting: Radiation Oncology

## 2018-06-16 ENCOUNTER — Other Ambulatory Visit: Payer: Self-pay

## 2018-06-16 VITALS — BP 134/66 | HR 86 | Temp 98.0°F | Resp 16 | Wt 206.8 lb

## 2018-06-16 DIAGNOSIS — Z5111 Encounter for antineoplastic chemotherapy: Secondary | ICD-10-CM | POA: Diagnosis not present

## 2018-06-16 DIAGNOSIS — Z510 Encounter for antineoplastic radiation therapy: Secondary | ICD-10-CM | POA: Diagnosis not present

## 2018-06-16 DIAGNOSIS — I2510 Atherosclerotic heart disease of native coronary artery without angina pectoris: Secondary | ICD-10-CM | POA: Diagnosis not present

## 2018-06-16 DIAGNOSIS — E785 Hyperlipidemia, unspecified: Secondary | ICD-10-CM | POA: Diagnosis not present

## 2018-06-16 DIAGNOSIS — I10 Essential (primary) hypertension: Secondary | ICD-10-CM | POA: Diagnosis not present

## 2018-06-16 DIAGNOSIS — C3411 Malignant neoplasm of upper lobe, right bronchus or lung: Secondary | ICD-10-CM | POA: Diagnosis not present

## 2018-06-16 DIAGNOSIS — C3412 Malignant neoplasm of upper lobe, left bronchus or lung: Secondary | ICD-10-CM | POA: Diagnosis not present

## 2018-06-16 DIAGNOSIS — C3402 Malignant neoplasm of left main bronchus: Secondary | ICD-10-CM | POA: Diagnosis not present

## 2018-06-16 DIAGNOSIS — K5900 Constipation, unspecified: Secondary | ICD-10-CM | POA: Diagnosis not present

## 2018-06-16 DIAGNOSIS — C3492 Malignant neoplasm of unspecified part of left bronchus or lung: Secondary | ICD-10-CM

## 2018-06-16 DIAGNOSIS — I739 Peripheral vascular disease, unspecified: Secondary | ICD-10-CM | POA: Diagnosis not present

## 2018-06-16 DIAGNOSIS — R197 Diarrhea, unspecified: Secondary | ICD-10-CM | POA: Diagnosis not present

## 2018-06-16 DIAGNOSIS — J439 Emphysema, unspecified: Secondary | ICD-10-CM | POA: Diagnosis not present

## 2018-06-16 LAB — CBC WITH DIFFERENTIAL (CANCER CENTER ONLY)
Abs Immature Granulocytes: 0.02 10*3/uL (ref 0.00–0.07)
Basophils Absolute: 0 10*3/uL (ref 0.0–0.1)
Basophils Relative: 1 %
Eosinophils Absolute: 0 10*3/uL (ref 0.0–0.5)
Eosinophils Relative: 0 %
HCT: 34.3 % — ABNORMAL LOW (ref 39.0–52.0)
Hemoglobin: 11.7 g/dL — ABNORMAL LOW (ref 13.0–17.0)
Immature Granulocytes: 1 %
Lymphocytes Relative: 21 %
Lymphs Abs: 0.7 10*3/uL (ref 0.7–4.0)
MCH: 31 pg (ref 26.0–34.0)
MCHC: 34.1 g/dL (ref 30.0–36.0)
MCV: 91 fL (ref 80.0–100.0)
Monocytes Absolute: 0.2 10*3/uL (ref 0.1–1.0)
Monocytes Relative: 6 %
Neutro Abs: 2.2 10*3/uL (ref 1.7–7.7)
Neutrophils Relative %: 71 %
Platelet Count: 113 10*3/uL — ABNORMAL LOW (ref 150–400)
RBC: 3.77 MIL/uL — ABNORMAL LOW (ref 4.22–5.81)
RDW: 15.6 % — ABNORMAL HIGH (ref 11.5–15.5)
WBC Count: 3.1 10*3/uL — ABNORMAL LOW (ref 4.0–10.5)
nRBC: 0 % (ref 0.0–0.2)

## 2018-06-16 LAB — CMP (CANCER CENTER ONLY)
ALT: 19 U/L (ref 0–44)
AST: 13 U/L — ABNORMAL LOW (ref 15–41)
Albumin: 3.8 g/dL (ref 3.5–5.0)
Alkaline Phosphatase: 116 U/L (ref 38–126)
Anion gap: 8 (ref 5–15)
BUN: 16 mg/dL (ref 8–23)
CO2: 23 mmol/L (ref 22–32)
Calcium: 8.8 mg/dL — ABNORMAL LOW (ref 8.9–10.3)
Chloride: 103 mmol/L (ref 98–111)
Creatinine: 1.1 mg/dL (ref 0.61–1.24)
GFR, Est AFR Am: >60 mL/min (ref >60)
GFR, Estimated: >60 mL/min (ref >60)
Glucose, Bld: 109 mg/dL — ABNORMAL HIGH (ref 70–99)
Potassium: 4.1 mmol/L (ref 3.5–5.1)
Sodium: 134 mmol/L — ABNORMAL LOW (ref 135–145)
Total Bilirubin: 0.9 mg/dL (ref 0.3–1.2)
Total Protein: 6.5 g/dL (ref 6.5–8.1)

## 2018-06-16 MED ORDER — SODIUM CHLORIDE 0.9 % IV SOLN
Freq: Once | INTRAVENOUS | Status: AC
  Administered 2018-06-16: 12:00:00 via INTRAVENOUS
  Filled 2018-06-16: qty 250

## 2018-06-16 MED ORDER — SODIUM CHLORIDE 0.9 % IV SOLN
222.6000 mg | Freq: Once | INTRAVENOUS | Status: AC
  Administered 2018-06-16: 220 mg via INTRAVENOUS
  Filled 2018-06-16: qty 22

## 2018-06-16 MED ORDER — PALONOSETRON HCL INJECTION 0.25 MG/5ML
0.2500 mg | Freq: Once | INTRAVENOUS | Status: AC
  Administered 2018-06-16: 12:00:00 0.25 mg via INTRAVENOUS

## 2018-06-16 MED ORDER — PALONOSETRON HCL INJECTION 0.25 MG/5ML
INTRAVENOUS | Status: AC
  Filled 2018-06-16: qty 5

## 2018-06-16 MED ORDER — FAMOTIDINE IN NACL 20-0.9 MG/50ML-% IV SOLN
20.0000 mg | Freq: Once | INTRAVENOUS | Status: AC
  Administered 2018-06-16: 12:00:00 20 mg via INTRAVENOUS

## 2018-06-16 MED ORDER — DIPHENHYDRAMINE HCL 50 MG/ML IJ SOLN
50.0000 mg | Freq: Once | INTRAMUSCULAR | Status: AC
  Administered 2018-06-16: 12:00:00 50 mg via INTRAVENOUS

## 2018-06-16 MED ORDER — DIPHENHYDRAMINE HCL 50 MG/ML IJ SOLN
INTRAMUSCULAR | Status: AC
  Filled 2018-06-16: qty 1

## 2018-06-16 MED ORDER — SODIUM CHLORIDE 0.9 % IV SOLN
45.0000 mg/m2 | Freq: Once | INTRAVENOUS | Status: AC
  Administered 2018-06-16: 13:00:00 102 mg via INTRAVENOUS
  Filled 2018-06-16: qty 17

## 2018-06-16 MED ORDER — FAMOTIDINE IN NACL 20-0.9 MG/50ML-% IV SOLN
INTRAVENOUS | Status: AC
  Filled 2018-06-16: qty 50

## 2018-06-16 NOTE — Patient Instructions (Signed)
[facility] Discharge Instructions for Patients Receiving Chemotherapy  Today you received the following chemotherapy agents Taxol, Carboplatin  To help prevent nausea and vomiting after your treatment, we encourage you to take your nausea medication: As Directed by MD   If you develop nausea and vomiting that is not controlled by your nausea medication, call the clinic.   BELOW ARE SYMPTOMS THAT SHOULD BE REPORTED IMMEDIATELY:  *FEVER GREATER THAN 100.5 F  *CHILLS WITH OR WITHOUT FEVER  NAUSEA AND VOMITING THAT IS NOT CONTROLLED WITH YOUR NAUSEA MEDICATION  *UNUSUAL SHORTNESS OF BREATH  *UNUSUAL BRUISING OR BLEEDING  TENDERNESS IN MOUTH AND THROAT WITH OR WITHOUT PRESENCE OF ULCERS  *URINARY PROBLEMS  *BOWEL PROBLEMS  UNUSUAL RASH Items with * indicate a potential emergency and should be followed up as soon as possible.  Feel free to call the clinic should you have any questions or concerns. The clinic phone number is [phone].  Please show the [facility] at check-in to the Emergency Department and triage nurse.  Coronavirus (COVID-19) Are you at risk?  Are you at risk for the Coronavirus (COVID-19)?  To be considered HIGH RISK for Coronavirus (COVID-19), you have to meet the following criteria:  . Traveled to [place], [place], [place], [place] or [place]; or in the [place] to [place], [place], [place], or [place]; and have fever, cough, and shortness of breath within the last 2 weeks of travel OR . Been in close contact with a person diagnosed with COVID-19 within the last 2 weeks and have fever, cough, and shortness of breath . IF YOU DO NOT MEET THESE CRITERIA, YOU ARE CONSIDERED LOW RISK FOR COVID-19.  What to do if you are HIGH RISK for COVID-19?  Marland Kitchen If you are having a medical emergency, call 911. . Seek medical care right away. Before you go to a doctor's office, urgent care or emergency department, call ahead and tell them  about your recent travel, contact with someone diagnosed with COVID-19, and your symptoms. You should receive instructions from your physician's office regarding next steps of care.  . When you arrive at healthcare provider, tell the healthcare staff immediately you have returned from visiting [place], [place], [place], [place] or [place]; or traveled in the [place] to [place], [place], [place], or [place]; in the last two weeks or you have been in close contact with a person diagnosed with COVID-19 in the last 2 weeks.   . Tell the health care staff about your symptoms: fever, cough and shortness of breath. . After you have been seen by a medical provider, you will be either: o Tested for (COVID-19) and discharged home on quarantine except to seek medical care if symptoms worsen, and asked to  - Stay home and avoid contact with others until you get your results (4-5 days)  - Avoid travel on public transportation if possible (such as bus, train, or airplane) or o Sent to the Emergency Department by EMS for evaluation, COVID-19 testing, and possible admission depending on your condition and test results.  What to do if you are LOW RISK for COVID-19?  Reduce your risk of any infection by using the same precautions used for avoiding the common cold or flu:  Marland Kitchen Wash your hands often with soap and warm water for at least 20 seconds.  If soap and water are not readily available, use an alcohol-based hand sanitizer with at least 60% alcohol.  . If  coughing or sneezing, cover your mouth and nose by coughing or sneezing into the elbow areas of your shirt or coat, into a tissue or into your sleeve (not your hands). . Avoid shaking hands with others and consider head nods or verbal greetings only. . Avoid touching your eyes, nose, or mouth with unwashed hands.  . Avoid close contact with people who are sick. . Avoid places or events with large numbers of people in one location, like concerts or  sporting events. . Carefully consider travel plans you have or are making. . If you are planning any travel outside or inside the [place], visit the CDC's Travelers' Health webpage for the latest health notices. . If you have some symptoms but not all symptoms, continue to monitor at home and seek medical attention if your symptoms worsen. . If you are having a medical emergency, call 911.   [place] / e-Visit: [email or web address]         [facility]: [facility]: [phone]                   [facility]: [phone]

## 2018-06-17 ENCOUNTER — Other Ambulatory Visit: Payer: Self-pay | Admitting: Internal Medicine

## 2018-06-17 ENCOUNTER — Ambulatory Visit
Admission: RE | Admit: 2018-06-17 | Discharge: 2018-06-17 | Disposition: A | Discharge status: Home / Self Care | Payer: Medicare Other | Source: Ambulatory Visit | Attending: Radiation Oncology | Admitting: Radiation Oncology

## 2018-06-17 ENCOUNTER — Ambulatory Visit: Payer: Medicare Other

## 2018-06-17 ENCOUNTER — Telehealth: Payer: Self-pay | Admitting: Medical Oncology

## 2018-06-17 ENCOUNTER — Other Ambulatory Visit: Payer: Self-pay

## 2018-06-17 DIAGNOSIS — C3412 Malignant neoplasm of upper lobe, left bronchus or lung: Secondary | ICD-10-CM | POA: Diagnosis not present

## 2018-06-17 DIAGNOSIS — I10 Essential (primary) hypertension: Secondary | ICD-10-CM | POA: Diagnosis not present

## 2018-06-17 DIAGNOSIS — I2510 Atherosclerotic heart disease of native coronary artery without angina pectoris: Secondary | ICD-10-CM | POA: Diagnosis not present

## 2018-06-17 DIAGNOSIS — I739 Peripheral vascular disease, unspecified: Secondary | ICD-10-CM | POA: Diagnosis not present

## 2018-06-17 DIAGNOSIS — J439 Emphysema, unspecified: Secondary | ICD-10-CM | POA: Diagnosis not present

## 2018-06-17 DIAGNOSIS — C3490 Malignant neoplasm of unspecified part of unspecified bronchus or lung: Secondary | ICD-10-CM

## 2018-06-17 DIAGNOSIS — C3402 Malignant neoplasm of left main bronchus: Secondary | ICD-10-CM | POA: Diagnosis not present

## 2018-06-17 DIAGNOSIS — Z510 Encounter for antineoplastic radiation therapy: Secondary | ICD-10-CM | POA: Diagnosis not present

## 2018-06-17 NOTE — Telephone Encounter (Signed)
Ok. I did order it. They need to cancel the other one. I was unable to cancel it.

## 2018-06-17 NOTE — Telephone Encounter (Signed)
LVM that CT order will be changed to without contrast due to iodine allergy/

## 2018-06-18 ENCOUNTER — Encounter: Payer: Self-pay | Admitting: Radiation Oncology

## 2018-06-18 ENCOUNTER — Ambulatory Visit
Admission: RE | Admit: 2018-06-18 | Discharge: 2018-06-18 | Disposition: A | Discharge status: Home / Self Care | Payer: Medicare Other | Source: Ambulatory Visit | Attending: Radiation Oncology | Admitting: Radiation Oncology

## 2018-06-18 ENCOUNTER — Other Ambulatory Visit: Payer: Self-pay

## 2018-06-18 ENCOUNTER — Ambulatory Visit: Payer: Medicare Other

## 2018-06-18 DIAGNOSIS — I2510 Atherosclerotic heart disease of native coronary artery without angina pectoris: Secondary | ICD-10-CM | POA: Diagnosis not present

## 2018-06-18 DIAGNOSIS — C3402 Malignant neoplasm of left main bronchus: Secondary | ICD-10-CM | POA: Diagnosis not present

## 2018-06-18 DIAGNOSIS — I10 Essential (primary) hypertension: Secondary | ICD-10-CM | POA: Diagnosis not present

## 2018-06-18 DIAGNOSIS — Z510 Encounter for antineoplastic radiation therapy: Secondary | ICD-10-CM | POA: Diagnosis not present

## 2018-06-18 DIAGNOSIS — I739 Peripheral vascular disease, unspecified: Secondary | ICD-10-CM | POA: Diagnosis not present

## 2018-06-18 DIAGNOSIS — C3412 Malignant neoplasm of upper lobe, left bronchus or lung: Secondary | ICD-10-CM | POA: Diagnosis not present

## 2018-06-18 DIAGNOSIS — J439 Emphysema, unspecified: Secondary | ICD-10-CM | POA: Diagnosis not present

## 2018-06-19 ENCOUNTER — Ambulatory Visit: Payer: Medicare Other

## 2018-06-23 ENCOUNTER — Ambulatory Visit: Payer: Medicare Other

## 2018-06-23 NOTE — Progress Notes (Signed)
        Note reviewed, and agree with documentation and plan.  Signed,   [name], MD [facility].  06/23/18 1:17 PM

## 2018-07-11 ENCOUNTER — Ambulatory Visit (HOSPITAL_COMMUNITY)
Admission: RE | Admit: 2018-07-11 | Discharge: 2018-07-11 | Disposition: A | Discharge status: Home / Self Care | Payer: Medicare Other | Source: Ambulatory Visit | Attending: Internal Medicine | Admitting: Internal Medicine

## 2018-07-11 ENCOUNTER — Other Ambulatory Visit: Payer: Medicare Other

## 2018-07-11 ENCOUNTER — Other Ambulatory Visit: Payer: Self-pay

## 2018-07-11 ENCOUNTER — Inpatient Hospital Stay: Payer: Medicare Other | Attending: Internal Medicine

## 2018-07-11 ENCOUNTER — Encounter (HOSPITAL_COMMUNITY): Payer: Self-pay

## 2018-07-11 DIAGNOSIS — I10 Essential (primary) hypertension: Secondary | ICD-10-CM | POA: Insufficient documentation

## 2018-07-11 DIAGNOSIS — C3490 Malignant neoplasm of unspecified part of unspecified bronchus or lung: Secondary | ICD-10-CM

## 2018-07-11 DIAGNOSIS — Z9221 Personal history of antineoplastic chemotherapy: Secondary | ICD-10-CM | POA: Diagnosis not present

## 2018-07-11 DIAGNOSIS — C3411 Malignant neoplasm of upper lobe, right bronchus or lung: Secondary | ICD-10-CM | POA: Insufficient documentation

## 2018-07-11 DIAGNOSIS — R05 Cough: Secondary | ICD-10-CM | POA: Insufficient documentation

## 2018-07-11 DIAGNOSIS — K219 Gastro-esophageal reflux disease without esophagitis: Secondary | ICD-10-CM | POA: Insufficient documentation

## 2018-07-11 DIAGNOSIS — R630 Anorexia: Secondary | ICD-10-CM | POA: Diagnosis not present

## 2018-07-11 DIAGNOSIS — Z923 Personal history of irradiation: Secondary | ICD-10-CM | POA: Diagnosis not present

## 2018-07-11 DIAGNOSIS — Z79899 Other long term (current) drug therapy: Secondary | ICD-10-CM | POA: Insufficient documentation

## 2018-07-11 DIAGNOSIS — Z7982 Long term (current) use of aspirin: Secondary | ICD-10-CM | POA: Insufficient documentation

## 2018-07-11 DIAGNOSIS — E785 Hyperlipidemia, unspecified: Secondary | ICD-10-CM | POA: Insufficient documentation

## 2018-07-11 HISTORY — DX: Malignant (primary) neoplasm, unspecified: C80.1

## 2018-07-11 LAB — CBC WITH DIFFERENTIAL (CANCER CENTER ONLY)
Abs Immature Granulocytes: 0.03 10*3/uL (ref 0.00–0.07)
Basophils Absolute: 0 10*3/uL (ref 0.0–0.1)
Basophils Relative: 1 %
Eosinophils Absolute: 0.1 10*3/uL (ref 0.0–0.5)
Eosinophils Relative: 2 %
HCT: 32.6 % — ABNORMAL LOW (ref 39.0–52.0)
Hemoglobin: 10.8 g/dL — ABNORMAL LOW (ref 13.0–17.0)
Immature Granulocytes: 1 %
Lymphocytes Relative: 17 %
Lymphs Abs: 0.7 10*3/uL (ref 0.7–4.0)
MCH: 32.8 pg (ref 26.0–34.0)
MCHC: 33.1 g/dL (ref 30.0–36.0)
MCV: 99.1 fL (ref 80.0–100.0)
Monocytes Absolute: 0.6 10*3/uL (ref 0.1–1.0)
Monocytes Relative: 15 %
Neutro Abs: 2.6 10*3/uL (ref 1.7–7.7)
Neutrophils Relative %: 64 %
Platelet Count: 195 10*3/uL (ref 150–400)
RBC: 3.29 MIL/uL — ABNORMAL LOW (ref 4.22–5.81)
RDW: 19.4 % — ABNORMAL HIGH (ref 11.5–15.5)
WBC Count: 4 10*3/uL (ref 4.0–10.5)
nRBC: 0 % (ref 0.0–0.2)

## 2018-07-11 LAB — CMP (CANCER CENTER ONLY)
ALT: 14 U/L (ref 0–44)
AST: 13 U/L — ABNORMAL LOW (ref 15–41)
Albumin: 3.5 g/dL (ref 3.5–5.0)
Alkaline Phosphatase: 132 U/L — ABNORMAL HIGH (ref 38–126)
Anion gap: 8 (ref 5–15)
BUN: 10 mg/dL (ref 8–23)
CO2: 24 mmol/L (ref 22–32)
Calcium: 8.7 mg/dL — ABNORMAL LOW (ref 8.9–10.3)
Chloride: 105 mmol/L (ref 98–111)
Creatinine: 1.15 mg/dL (ref 0.61–1.24)
GFR, Est AFR Am: >60 mL/min (ref >60)
GFR, Estimated: >60 mL/min (ref >60)
Glucose, Bld: 110 mg/dL — ABNORMAL HIGH (ref 70–99)
Potassium: 4.3 mmol/L (ref 3.5–5.1)
Sodium: 137 mmol/L (ref 135–145)
Total Bilirubin: 0.6 mg/dL (ref 0.3–1.2)
Total Protein: 6.3 g/dL — ABNORMAL LOW (ref 6.5–8.1)

## 2018-07-13 ENCOUNTER — Other Ambulatory Visit: Payer: Self-pay | Admitting: Family Medicine

## 2018-07-13 DIAGNOSIS — E785 Hyperlipidemia, unspecified: Secondary | ICD-10-CM

## 2018-07-13 DIAGNOSIS — I10 Essential (primary) hypertension: Secondary | ICD-10-CM

## 2018-07-13 NOTE — Telephone Encounter (Signed)
Requested Prescriptions  Pending Prescriptions Disp Refills  . metoprolol tartrate (LOPRESSOR) 50 MG tablet [Pharmacy Med Name: METOPROLOL TARTRATE 50 MG TAB] 270 tablet 0    Sig: TAKE 1.5 TABLETS BY MOUTH TWICE A DAY     Cardiovascular:  Beta Blockers Passed - 07/13/2018 12:33 PM      Passed - Last BP in normal range    BP Readings from Last 1 Encounters:  06/16/18 134/66         Passed - Last Heart Rate in normal range    Pulse Readings from Last 1 Encounters:  06/16/18 86         Passed - Valid encounter within last 6 months    Recent Outpatient Visits          1 month ago Medicare annual wellness visit, subsequent   [facility], [name], MD   2 months ago Left foot pain   [facility], Arlie Solomons, MD   6 months ago Hemoptysis   [facility], [name], MD   6 months ago Chest x-ray abnormality   [facility], MD   7 months ago Hemoptysis   [facility], [name], MD

## 2018-07-14 ENCOUNTER — Other Ambulatory Visit: Payer: Self-pay | Admitting: Family Medicine

## 2018-07-14 ENCOUNTER — Other Ambulatory Visit: Payer: Self-pay

## 2018-07-14 ENCOUNTER — Inpatient Hospital Stay (HOSPITAL_BASED_OUTPATIENT_CLINIC_OR_DEPARTMENT_OTHER): Payer: Medicare Other | Admitting: Internal Medicine

## 2018-07-14 ENCOUNTER — Encounter: Payer: Self-pay | Admitting: Internal Medicine

## 2018-07-14 VITALS — BP 131/75 | HR 78 | Temp 98.7°F | Resp 18 | Ht 73.0 in | Wt 207.2 lb

## 2018-07-14 DIAGNOSIS — R630 Anorexia: Secondary | ICD-10-CM

## 2018-07-14 DIAGNOSIS — I10 Essential (primary) hypertension: Secondary | ICD-10-CM

## 2018-07-14 DIAGNOSIS — F17200 Nicotine dependence, unspecified, uncomplicated: Secondary | ICD-10-CM

## 2018-07-14 DIAGNOSIS — E785 Hyperlipidemia, unspecified: Secondary | ICD-10-CM

## 2018-07-14 DIAGNOSIS — C3490 Malignant neoplasm of unspecified part of unspecified bronchus or lung: Secondary | ICD-10-CM

## 2018-07-14 DIAGNOSIS — C3492 Malignant neoplasm of unspecified part of left bronchus or lung: Secondary | ICD-10-CM

## 2018-07-14 DIAGNOSIS — C3411 Malignant neoplasm of upper lobe, right bronchus or lung: Secondary | ICD-10-CM | POA: Diagnosis not present

## 2018-07-14 DIAGNOSIS — Z923 Personal history of irradiation: Secondary | ICD-10-CM

## 2018-07-14 DIAGNOSIS — R040 Epistaxis: Secondary | ICD-10-CM

## 2018-07-14 DIAGNOSIS — Z79899 Other long term (current) drug therapy: Secondary | ICD-10-CM

## 2018-07-14 DIAGNOSIS — R05 Cough: Secondary | ICD-10-CM

## 2018-07-14 DIAGNOSIS — Z7982 Long term (current) use of aspirin: Secondary | ICD-10-CM | POA: Diagnosis not present

## 2018-07-14 DIAGNOSIS — Z9221 Personal history of antineoplastic chemotherapy: Secondary | ICD-10-CM

## 2018-07-14 DIAGNOSIS — K219 Gastro-esophageal reflux disease without esophagitis: Secondary | ICD-10-CM

## 2018-07-14 NOTE — Progress Notes (Signed)
[place] [phone]   [phone]  OFFICE PROGRESS NOTE  [name], MD [place] [address]  DIAGNOSIS: Stage IIB (T1c, N1, M0) non-small cell lung cancer favoring adenocarcinoma presented with right upper lobe lung nodule in addition to right hilar lymphadenopathy diagnosed in February 2020.  The patient is not a good surgical candidate for resection.  PRIOR THERAPY: Concurrent chemoradiation with weekly carboplatin for AUC of 2 and paclitaxel 45 mg/M2.  First dose May 05, 2018.  Status post 7 cycles.  Last dose was given Jun 16, 2018  CURRENT THERAPY: Observation.  INTERVAL HISTORY: Seth [name] 73 y.o. male returns to the clinic today for follow-up visit.  His wife [name] was available by phone during the visit.  The patient is feeling fine today with no concerning complaints except for change of taste and lack of appetite.  He also has intermittent cough productive of whitish sputum.  He denied having any chest pain but has shortness of breath with exertion with no hemoptysis.  He denied having any fever or chills.  He has no nausea, vomiting, diarrhea or constipation.  He completed a course of concurrent chemoradiation with weekly carboplatin and paclitaxel.  The patient is here today for evaluation with repeat CT scan of the chest for restaging of his disease.  MEDICAL HISTORY: Past Medical History:  Diagnosis Date  . AAA (abdominal aortic aneurysm) ([place])   . Allergic rhinitis   . Anxiety   . Back pain   . CAD (coronary artery disease)   . Cataracts, bilateral   . Emphysema, unspecified ([place])     " moderate"  . Essential hypertension, benign   . GERD (gastroesophageal reflux disease)   . Gout   . Headache    migraines  . Heart murmur    as a child only  . Hilar adenopathy   . History of hiatal hernia   . History of sciatica   . Hyperlipidemia   . lung ca dx'd 02/2018  . Lung nodule   . Pneumonia    as  child  . Prostate hypertrophy   . PVD (peripheral vascular disease) ([place])   . Seasonal allergies   . Subclavian steal syndrome of left subclavian artery   . Wears dentures   . Wears dentures     ALLERGIES:  is allergic to biaxin [clarithromycin]; clarithromycin; iodinated diagnostic agents; prednisone; cortisone; flomax [tamsulosin hcl]; hepatitis b virus vaccines; and iodine.  MEDICATIONS:  Current Outpatient Medications  Medication Sig Dispense Refill  . aspirin EC 81 MG tablet Take 81 mg by mouth daily as needed for mild pain.    Marland Kitchen atorvastatin (LIPITOR) 40 MG tablet Take 1 tablet (40 mg total) by mouth daily. 90 tablet 3  . BioGaia Probiotic (BIOGAIA PROBIOTIC) LIQD Take by mouth daily at 8 pm.    . cetirizine (ZYRTEC) 10 MG tablet Take 10 mg by mouth daily at 12 noon.    . clobetasol cream (TEMOVATE) 0.63 % Apply 1 application topically 2 (two) times daily as needed (rash/skin irritation.).     Marland Kitchen clopidogrel (PLAVIX) 75 MG tablet Take 1 tablet (75 mg total) by mouth daily. 90 tablet 2  . clotrimazole (LOTRIMIN) 1 % cream Apply 1 application topically 2 (two) times daily. To affected area on groin. Use for 2 weeks, or at least 2 days after resolution of rash (Patient taking differently: Apply 1 application topically 2 (two) times daily as needed (rash/fungal  infection.). To affected area on groin. Use for 2 weeks, or at least 2 days after resolution of rash) 30 g 0  . colchicine 0.6 MG tablet TAKE 1 TABLET BY MOUTH TWICE A DAY 180 tablet 1  . diphenhydrAMINE (BENADRYL) 25 MG tablet Take 25 mg by mouth every 6 (six) hours as needed.    . famotidine (PEPCID) 20 MG tablet Take 20 mg by mouth 2 (two) times daily. Noon & in the evening.    Marland Kitchen lisinopril (PRINIVIL,ZESTRIL) 10 MG tablet TAKE 1 TABLET BY MOUTH EVERY DAY 90 tablet 1  . lisinopril (ZESTRIL) 5 MG tablet TAKE 1 TABLET BY MOUTH EVERY DAY 90 tablet 0  . loperamide (IMODIUM) 2 MG capsule Take 4 mg by mouth as needed for diarrhea or  loose stools.    . metoprolol tartrate (LOPRESSOR) 50 MG tablet TAKE 1.5 TABLETS BY MOUTH TWICE A DAY 270 tablet 0  . prochlorperazine (COMPAZINE) 10 MG tablet Take 1 tablet (10 mg total) by mouth every 6 (six) hours as needed for nausea or vomiting. 30 tablet 0  . sucralfate (CARAFATE) 1 g tablet Take 1 tablet (1 g total) by mouth 4 (four) times daily. 120 tablet 2  . Triamcinolone Acetonide (NASACORT AQ NA) Place 1 spray into the nose daily at 2 am. (0300 or 0400)    . triamcinolone cream (KENALOG) 0.1 % Apply 1 application topically 2 (two) times daily as needed. (Patient taking differently: Apply 1 application topically 2 (two) times daily as needed (rash/skin irritation.). ) 30 g 0   Current Facility-Administered Medications  Medication Dose Route Frequency Provider Last Rate Last Dose  . 0.9 %  sodium chloride infusion  500 mL Intravenous Once Irene Shipper, MD        SURGICAL HISTORY:  Past Surgical History:  Procedure Laterality Date  . CARDIAC CATHETERIZATION     stent placement  . COLONOSCOPY W/ BIOPSIES AND POLYPECTOMY    . ILIAC ARTERY STENT    . MULTIPLE TOOTH EXTRACTIONS    . VIDEO BRONCHOSCOPY WITH ENDOBRONCHIAL NAVIGATION N/A 02/26/2018   Procedure: VIDEO BRONCHOSCOPY WITH ENDOBRONCHIAL NAVIGATION;  Surgeon: Garner Nash, DO;  Location: [facility];  Service: Thoracic;  Laterality: N/A;  . VIDEO BRONCHOSCOPY WITH ENDOBRONCHIAL NAVIGATION N/A 03/19/2018   Procedure: VIDEO BRONCHOSCOPY WITH ENDOBRONCHIAL NAVIGATION and endobronchial ultrasound;  Surgeon: Garner Nash, DO;  Location: [facility];  Service: Thoracic;  Laterality: N/A;  . VIDEO BRONCHOSCOPY WITH ENDOBRONCHIAL ULTRASOUND N/A 02/26/2018   Procedure: VIDEO BRONCHOSCOPY WITH ENDOBRONCHIAL ULTRASOUND;  Surgeon: Garner Nash, DO;  Location: [facility];  Service: Thoracic;  Laterality: N/A;  . VIDEO BRONCHOSCOPY WITH ENDOBRONCHIAL ULTRASOUND N/A 03/19/2018   Procedure: NAVIGATION BRONCHOSCOPY;  Surgeon: Garner Nash, DO;   Location: [facility];  Service: Thoracic;  Laterality: N/A;    REVIEW OF SYSTEMS:  Constitutional: positive for fatigue Eyes: negative Ears, nose, mouth, throat, and face: negative Respiratory: positive for cough and dyspnea on exertion Cardiovascular: negative Gastrointestinal: negative Genitourinary:negative Integument/breast: negative Hematologic/lymphatic: negative Musculoskeletal:negative Neurological: negative Behavioral/Psych: negative Endocrine: negative Allergic/Immunologic: negative   PHYSICAL EXAMINATION: General appearance: alert, cooperative and no distress Head: Normocephalic, without obvious abnormality, atraumatic Neck: no adenopathy, no JVD, supple, symmetrical, trachea midline and thyroid not enlarged, symmetric, no tenderness/mass/nodules Lymph nodes: Cervical, supraclavicular, and axillary nodes normal. Resp: clear to auscultation bilaterally Back: symmetric, no curvature. ROM normal. No CVA tenderness. Cardio: regular rate and rhythm, S1, S2 normal, no murmur, click, rub or gallop GI: soft, non-tender; bowel sounds  normal; no masses,  no organomegaly Extremities: extremities normal, atraumatic, no cyanosis or edema Neurologic: Alert and oriented X 3, normal strength and tone. Normal symmetric reflexes. Normal coordination and gait  ECOG PERFORMANCE STATUS: 1 - Symptomatic but completely ambulatory  Blood pressure 131/75, pulse 78, temperature 98.7 F (37.1 C), temperature source Oral, resp. rate 18, height 6\' 1"  (1.854 m), weight 207 lb 3.2 oz (94 kg), SpO2 100 %.  LABORATORY DATA: Lab Results  Component Value Date   WBC 4.0 07/11/2018   HGB 10.8 (L) 07/11/2018   HCT 32.6 (L) 07/11/2018   MCV 99.1 07/11/2018   PLT 195 07/11/2018      Chemistry      Component Value Date/Time   NA 137 07/11/2018 1210   NA 140 04/28/2018 1647   K 4.3 07/11/2018 1210   CL 105 07/11/2018 1210   CO2 24 07/11/2018 1210   BUN 10 07/11/2018 1210   BUN 9 04/28/2018 1647    CREATININE 1.15 07/11/2018 1210   CREATININE 1.09 03/23/2015 0928      Component Value Date/Time   CALCIUM 8.7 (L) 07/11/2018 1210   ALKPHOS 132 (H) 07/11/2018 1210   AST 13 (L) 07/11/2018 1210   ALT 14 07/11/2018 1210   BILITOT 0.6 07/11/2018 1210       RADIOGRAPHIC STUDIES: Ct Chest Wo Contrast  Result Date: 07/11/2018 CLINICAL DATA:  Lung cancer.  Restaging EXAM: CT CHEST WITHOUT CONTRAST TECHNIQUE: Multidetector CT imaging of the chest was performed following the standard protocol without IV contrast. COMPARISON:  03/03/2018 FINDINGS: Cardiovascular: Heart size appears normal. No pericardial effusion identified. Aortic atherosclerosis. Coronary artery calcifications are noted involving the RCA, LAD and left circumflex. Mediastinum/Nodes: Normal appearance of the thyroid gland. The trachea appears patent and is midline. Normal appearance of the esophagus. No mediastinal or hilar adenopathy. No supraclavicular or axillary adenopathy. Lungs/Pleura: No pleural effusion. Moderate change of emphysema. The lingular nodule measures 1.4 by 1.2 cm, image 86/7. Previously 1.9 x 2.2 cm. No new pulmonary nodules. Upper Abdomen: No acute abnormality. Musculoskeletal: No chest wall mass or suspicious bone lesions identified. IMPRESSION: 1. Response to therapy with decrease in size lingular nodule. 2. No new or progressive findings. 3. Aortic Atherosclerosis (ICD10-I70.0) and Emphysema (ICD10-J43.9). Coronary artery calcifications. Electronically Signed   By: Kerby Moors M.D.   On: 07/11/2018 15:47    ASSESSMENT AND PLAN: This is a very pleasant 73 years old white male recently diagnosed with unresectable stage IIb non-small cell lung cancer, adenocarcinoma presented with right upper lobe lung nodule in addition to right hilar lymphadenopathy diagnosed in February 2020.  The patient is now a good candidate for surgical resection. The patient underwent a course of concurrent chemoradiation with weekly  carboplatin and paclitaxel status post 7 cycles completed in May 2020. He had repeat CT scan of the chest performed recently.  I personally and independently reviewed the scans and discussed the results with the patient today. His a scan showed improvement of his disease. I recommended for the patient to continue on observation with repeat CT scan of the chest in 3 months. He was advised to call immediately if he has any concerning symptoms in the interval. The patient voices understanding of current disease status and treatment options and is in agreement with the current care plan. All questions were answered. The patient knows to call the clinic with any problems, questions or concerns. We can certainly see the patient much sooner if necessary.  Disclaimer: This note was dictated  with voice recognition software. Similar sounding words can inadvertently be transcribed and may not be corrected upon review.

## 2018-07-15 ENCOUNTER — Telehealth: Payer: Self-pay | Admitting: Internal Medicine

## 2018-07-15 NOTE — Telephone Encounter (Signed)
Scheduled appt per 6/08 los - mailed letter with appt date and time

## 2018-07-18 ENCOUNTER — Other Ambulatory Visit: Payer: Self-pay | Admitting: Family Medicine

## 2018-07-18 DIAGNOSIS — I739 Peripheral vascular disease, unspecified: Secondary | ICD-10-CM

## 2018-07-18 NOTE — Telephone Encounter (Signed)
Forwarding medication refill to PCP for review. 

## 2018-07-22 ENCOUNTER — Telehealth: Payer: Self-pay | Admitting: Radiation Oncology

## 2018-07-22 NOTE — Telephone Encounter (Signed)
  Radiation Oncology         [phone] ________________________________  Name: Seth [name] MRN: [id number]  Date of Service: 07/22/2018  DOB: [birth year]  Post Treatment Telephone Note  Diagnosis:    Stage IIB, cT1cN1, NSCLC, not otherwise classified of the left hilum.  Interval Since Last Radiation:  5 weeks   05/05/2018-06/18/2018:  The patient received 60 Gy in 30 fractions to the left hilar tumor, along with a 6 Gy boost in 3 fractions.   Narrative:  The patient was contacted today for routine follow-up. During treatment she did very well with radiotherapy and did not have significant desquamation but did have moderate esophagitis.  Impression/Plan: 1.  Stage IIB, cT1cN1, NSCLC, not otherwise classified of the left hilum. The patient has been doing well since completion of radiotherapy. We will plan to see him back as needed. He will continue to follow up with Dr. Julien Nordmann as well in observation.     Carola Rhine, PAC

## 2018-08-12 ENCOUNTER — Telehealth: Payer: Self-pay | Admitting: Family Medicine

## 2018-08-12 NOTE — Telephone Encounter (Signed)
Pt would like a refill on his colchicine 0.6 MG tablet [[id number]] at  Pharmacy:  [id number] - [place], [place]  DEA #:  [id number]   . Please advise at [phone]

## 2018-08-15 NOTE — Telephone Encounter (Signed)
Appointment was scheduled to follow-up for medication 08/18/2018.

## 2018-08-18 ENCOUNTER — Other Ambulatory Visit: Payer: Medicare Other | Admitting: *Deleted

## 2018-08-18 ENCOUNTER — Other Ambulatory Visit: Payer: Self-pay

## 2018-08-18 DIAGNOSIS — E7800 Pure hypercholesterolemia, unspecified: Secondary | ICD-10-CM

## 2018-08-18 LAB — LIPID PANEL
Chol/HDL Ratio: 3.9 ratio (ref 0.0–5.0)
Cholesterol, Total: 132 mg/dL (ref 100–199)
HDL: 34 mg/dL — ABNORMAL LOW (ref >39)
LDL Calculated: 68 mg/dL (ref 0–99)
Triglycerides: 152 mg/dL — ABNORMAL HIGH (ref 0–149)
VLDL Cholesterol Cal: 30 mg/dL (ref 5–40)

## 2018-08-18 LAB — ALT: ALT: 12 IU/L (ref 0–44)

## 2018-08-20 ENCOUNTER — Other Ambulatory Visit: Payer: Self-pay | Admitting: Radiation Oncology

## 2018-08-21 NOTE — Progress Notes (Signed)
°  Radiation Oncology         [phone] ________________________________  Name: Seth [name] MRN: [id number]  Date: 06/18/2018  DOB: Apr 04, 1945  End of Treatment Note  Diagnosis:  73 y.o. male with Stage IIB, cT1cN1, NSCLC, not otherwise classified of the left hilum  Indication for treatment::  curative       Radiation treatment dates:   05/05/2018 - 06/18/2018  Site/dose:   The patient was treated to the disease within the left lung initially to a dose of 60 Gy using a 4 field, 3-D conformal technique. The patient then received a cone down boost treatment for an additional 6 Gy. This yielded a final total dose of 66 Gy.   Beams/energy:   3D / 6X, 10X Photon  Narrative: The patient tolerated radiation treatment relatively well.   The patient did experience esophagitis during the course of treatment which required management with Carafate. He developed some erythema in the treatment field and is using Sonafine as directed. He also noted increased fatigue.  Plan: The patient has completed radiation treatment. The patient will undergo a chest CT scan in early June. The patient will return to radiation oncology clinic for routine followup in one month. I advised the patient to call or return sooner if they have any questions or concerns related to their recovery or treatment. ________________________________  Jodelle Gross, MD, PhD  This document serves as a record of services personally performed by Kyung Rudd, MD. It was created on his behalf by Rae Lips, a trained medical scribe. The creation of this record is based on the scribe's personal observations and the provider's statements to them. This document has been checked and approved by the attending provider.

## 2018-09-06 ENCOUNTER — Other Ambulatory Visit: Payer: Self-pay | Admitting: Family Medicine

## 2018-09-06 DIAGNOSIS — M79672 Pain in left foot: Secondary | ICD-10-CM

## 2018-09-10 ENCOUNTER — Other Ambulatory Visit: Payer: Self-pay | Admitting: Family Medicine

## 2018-09-10 DIAGNOSIS — I10 Essential (primary) hypertension: Secondary | ICD-10-CM

## 2018-09-10 NOTE — Telephone Encounter (Signed)
Requested Prescriptions  Pending Prescriptions Disp Refills  . lisinopril (ZESTRIL) 10 MG tablet [Pharmacy Med Name: LISINOPRIL 10 MG TABLET] 30 tablet 0    Sig: TAKE 1 TABLET BY MOUTH EVERY DAY     Cardiovascular:  ACE Inhibitors Passed - 09/10/2018  1:23 AM      Passed - Cr in normal range and within 180 days    Creatinine  Date Value Ref Range Status  07/11/2018 1.15 0.61 - 1.24 mg/dL Final   Creat  Date Value Ref Range Status  03/23/2015 1.09 0.70 - 1.25 mg/dL Final         Passed - K in normal range and within 180 days    Potassium  Date Value Ref Range Status  07/11/2018 4.3 3.5 - 5.1 mmol/L Final         Passed - Patient is not pregnant      Passed - Last BP in normal range    BP Readings from Last 1 Encounters:  07/14/18 131/75         Passed - Valid encounter within last 6 months    Recent Outpatient Visits          3 months ago Medicare annual wellness visit, subsequent   [facility], [name], MD   4 months ago Left foot pain   [facility], Arlie Solomons, MD   8 months ago Hemoptysis   [facility], [name], MD   8 months ago Chest x-ray abnormality   [facility], [name], MD   9 months ago Hemoptysis   [facility], [name], MD             Left voicemail for patient to call office to schedule follow up on chronic conditions.  30 day courtesy refill sent.

## 2018-09-11 ENCOUNTER — Telehealth: Payer: Self-pay | Admitting: Family Medicine

## 2018-09-11 NOTE — Telephone Encounter (Signed)
Patient will like to get a refill  Until his appointment on 8/13  Patient [phone]

## 2018-09-12 DIAGNOSIS — M720 Palmar fascial fibromatosis [Dupuytren]: Secondary | ICD-10-CM | POA: Diagnosis not present

## 2018-09-12 NOTE — Telephone Encounter (Signed)
Spoke with pt and he advised me he has medication to last until appointment

## 2018-09-18 ENCOUNTER — Encounter: Payer: Self-pay | Admitting: Family Medicine

## 2018-09-18 ENCOUNTER — Other Ambulatory Visit: Payer: Self-pay

## 2018-09-18 ENCOUNTER — Ambulatory Visit (INDEPENDENT_AMBULATORY_CARE_PROVIDER_SITE_OTHER): Payer: Medicare Other | Admitting: Family Medicine

## 2018-09-18 ENCOUNTER — Ambulatory Visit (INDEPENDENT_AMBULATORY_CARE_PROVIDER_SITE_OTHER): Payer: Medicare Other

## 2018-09-18 VITALS — BP 105/68 | HR 94 | Temp 97.9°F | Resp 14 | Wt 205.6 lb

## 2018-09-18 DIAGNOSIS — I739 Peripheral vascular disease, unspecified: Secondary | ICD-10-CM | POA: Diagnosis not present

## 2018-09-18 DIAGNOSIS — C3490 Malignant neoplasm of unspecified part of unspecified bronchus or lung: Secondary | ICD-10-CM | POA: Diagnosis not present

## 2018-09-18 DIAGNOSIS — M25472 Effusion, left ankle: Secondary | ICD-10-CM

## 2018-09-18 DIAGNOSIS — E785 Hyperlipidemia, unspecified: Secondary | ICD-10-CM | POA: Diagnosis not present

## 2018-09-18 DIAGNOSIS — H9191 Unspecified hearing loss, right ear: Secondary | ICD-10-CM | POA: Diagnosis not present

## 2018-09-18 DIAGNOSIS — I10 Essential (primary) hypertension: Secondary | ICD-10-CM

## 2018-09-18 DIAGNOSIS — I2510 Atherosclerotic heart disease of native coronary artery without angina pectoris: Secondary | ICD-10-CM | POA: Diagnosis not present

## 2018-09-18 DIAGNOSIS — H9011 Conductive hearing loss, unilateral, right ear, with unrestricted hearing on the contralateral side: Secondary | ICD-10-CM | POA: Diagnosis not present

## 2018-09-18 DIAGNOSIS — M7989 Other specified soft tissue disorders: Secondary | ICD-10-CM | POA: Diagnosis not present

## 2018-09-18 DIAGNOSIS — M7732 Calcaneal spur, left foot: Secondary | ICD-10-CM | POA: Diagnosis not present

## 2018-09-18 MED ORDER — LISINOPRIL 10 MG PO TABS: 10.0000 mg | ORAL_TABLET | Freq: Every day | ORAL | 1 refills | Status: DC

## 2018-09-18 MED ORDER — METOPROLOL TARTRATE 50 MG PO TABS: ORAL_TABLET | ORAL | 0 refills | Status: DC

## 2018-09-18 NOTE — Patient Instructions (Addendum)
  Decrease lisinopril to just 10mg  per day, continue metoprolol same dose for now for blood pressure.  Follow up with telemedicine visit in next 3 weeks - have home readings available for that appointment.   Call your vascular doctor for appointment as soon as possible to discuss your left foot/ankle swelling as that appears to be due to circulation, not gout.  If any acute worsening of the symptoms or increasing pain, be seen right away.  Elevation when seated can be helpful.  I will let you know if there are any concerns on your x-ray.  Decreased hearing in the right ear appears to be conductive hearing loss, or likely due to some congestion.  Try the triamcinolone nasal spray 1 to 2 sprays/day, saline nasal spray few times per day if needed for congestion and recheck in the next 4 days if that is not improving. I can also refer you back to ENT if needed if not improving.   Return to the clinic or go to the nearest emergency room if any of your symptoms worsen or new symptoms occur.    If you have lab work done today you will be contacted with your lab results within the next 2 weeks.  If you have not heard from [place] then please contact us. The fastest way to get your results is to register for My Chart.   IF you received an x-ray today, you will receive an invoice from [facility]. Please contact [facility] at [phone] with questions or concerns regarding your invoice.   IF you received labwork today, you will receive an invoice from [facility]. Please contact [facility] at [phone] with questions or concerns regarding your invoice.   Our billing staff will not be able to assist you with questions regarding bills from these companies.  You will be contacted with the lab results as soon as they are available. The fastest way to get your results is to activate your My Chart account. Instructions are located on the last page of this paperwork. If you have not heard from [place]  regarding the results in 2 weeks, please contact this office.

## 2018-09-18 NOTE — Progress Notes (Signed)
Subjective:    Patient ID: Seth [name], male    DOB: July 18, 1945, 73 y.o.   MRN: [id number]  HPI Seth [name] is a 73 y.o. male Presents today for: Chief Complaint  Patient presents with   Medication Refill    Patient need a f/u appt in order to keep getting refills on the lisinopril   Edema    both feet but the left foot is worse.  Thought it was gout but took the gout med and it did not help the swelling    Tinnitus    Can not hear out the left ear as of yday  Here for multiple concerns as above.  Last visit with me in December 2019.  Lung cancer: Stage IIb T1c, N1, M0 non-small cell lung cancer favoring adenocarcinoma and right upper lobe nodule in addition to right hilar lymphadenopathy diagnosed in February 2020.  Not a good surgical candidate for resection.  Previous concurrent chemoradiation, 7 cycles from March 30 through May 11.  Currently on observation.  Oncologist Dr. Earlie Server Last office visit June 8, now good candidate for surgical resection.  Repeat CT showed improvement of his disease.  Recommended continued observation with repeat CT scan in 3 months. If scan is ok in September, then plan for repeat imaging in 6 months.   Hypertension: BP Readings from Last 3 Encounters:  09/18/18 105/68  07/14/18 131/75  06/16/18 134/66   Lab Results  Component Value Date   CREATININE 1.15 07/11/2018  Takes lisinopril 15 mg total per day.  Metoprolol 75 mg twice daily. Not checking home BP.  Not lightheaded/dizzy. Missed last nights metoprolol dose.   Coronary artery disease: Cardiology, Dr. Radford Pax, prior Dr. Marlou Porch. Office visit February 25. History of nonobstructive CAD by cath after stress test medical therapy.  History of PVD with abdominal aortic aneurysm and bilateral iliac stents followed by Dr. Maryjean Morn in [facility]. Has continued Plavix and aspirin with history of peripheral vascular disease, atorvastatin 20 mg daily for borderline lipids-ok in June.  Echo in  March, EF 60 to 65%.  No concerning findings on stress testing at that time. New provider in [place] - Dr. Maryjean Morn left. Has ongoing scan for AAA, PVD.  No new bleeding. Still some bruises on arms. ASA prn, plavix daily.   Lab Results  Component Value Date   CHOL 132 08/18/2018   HDL 34 (L) 08/18/2018   LDLCALC 68 08/18/2018   TRIG 152 (H) 08/18/2018   CHOLHDL 3.9 08/18/2018    Foot swelling: Has noticed more foot swelling recently, left greater than right. Telemedicine visit in March with Dr. Nolon Rod, concerning for gout.  Colchicine prescribed. Lab Results  Component Value Date   LABURIC 6.9 04/28/2018   3-4 weeks noticed L ankle swelling. Tried colcicine - 2 per day for few days. Not on now.  Swelling not improved. Some soreness but not persistent. R ankle swells at times, not now.  No known injury.  Elevation of foot usually decreases swelling overnight.  No new CP/dyspena.  ? Arthritis dx in past.  Chronic numbness in left foot, some increased numbness past few months - had to clarify history.  Unable to take prednisone d/t allergy.   04/28/18 XR L foot: FINDINGS: There is no evidence of fracture or dislocation. There is mild osteoarthritis of the first MTP joint. There is a small plantar calcaneal spur. Soft tissues are unremarkable.  IMPRESSION: No acute osseous injury of the left foot.   Decreased hearing,  R ear: Noted since yesterday morning, feels blocked.  No pain.  Chronic tinnitus, no new symptoms.  Some chronic nasal congestion, possibly more past few days.  Has saline solution - has not been using.  No fever.  No d/c from ear.  Water sound in ear during shower last night.  No qtips/injury.  Has Nasacort, but not using. Did tolerate it's use in the past.   Patient Active Problem List   Diagnosis Date Noted   Frequent nosebleeds 05/28/2018   Encounter for antineoplastic chemotherapy 04/18/2018   Goals of care, counseling/discussion 04/18/2018     Adenocarcinoma of left lung, stage 2 ([place]) 03/27/2018   Abnormal PET of left lung    S/P bronchoscopy    Solitary lung nodule 02/20/2018   Hilar adenopathy 02/20/2018   Abnormal findings on diagnostic imaging of lung 02/20/2018   Platelet inhibition due to Plavix 02/20/2018   PVD (peripheral vascular disease) ([place]) 10/15/2013   Tobacco use disorder 10/15/2013   Pure hypercholesterolemia 10/15/2013   Coronary artery disease involving native coronary artery of native heart without angina pectoris 10/15/2013   Cataract 09/29/2013   Colon cancer screening 09/25/2013   AAA (abdominal aortic aneurysm) without rupture ([place]) 10/22/2012   Nicotine addiction 10/17/2012   Atherosclerosis of native arteries of the extremities with ulceration(440.23) 10/17/2012   Carotid bruit 10/17/2012   Other nonspecific abnormal cardiovascular system function study 04/10/2012   History of sciatica    Essential hypertension, benign    Seasonal allergies    Past Medical History:  Diagnosis Date   AAA (abdominal aortic aneurysm) (HCC)    Allergic rhinitis    Anxiety    Back pain    CAD (coronary artery disease)    Cataracts, bilateral    Emphysema, unspecified ([place])     " moderate"   Essential hypertension, benign    GERD (gastroesophageal reflux disease)    Gout    Headache    migraines   Heart murmur    as a child only   Hilar adenopathy    History of hiatal hernia    History of sciatica    Hyperlipidemia    lung ca dx'd 02/2018   Lung nodule    Pneumonia    as child   Prostate hypertrophy    PVD (peripheral vascular disease) ([place])    Seasonal allergies    Subclavian steal syndrome of left subclavian artery    Wears dentures    Wears dentures    Past Surgical History:  Procedure Laterality Date   CARDIAC CATHETERIZATION     stent placement   COLONOSCOPY W/ BIOPSIES AND POLYPECTOMY     ILIAC ARTERY STENT     MULTIPLE TOOTH  EXTRACTIONS     VIDEO BRONCHOSCOPY WITH ENDOBRONCHIAL NAVIGATION N/A 02/26/2018   Procedure: VIDEO BRONCHOSCOPY WITH ENDOBRONCHIAL NAVIGATION;  Surgeon: Garner Nash, DO;  Location: [facility];  Service: Thoracic;  Laterality: N/A;   VIDEO BRONCHOSCOPY WITH ENDOBRONCHIAL NAVIGATION N/A 03/19/2018   Procedure: VIDEO BRONCHOSCOPY WITH ENDOBRONCHIAL NAVIGATION and endobronchial ultrasound;  Surgeon: Garner Nash, DO;  Location: [facility];  Service: Thoracic;  Laterality: N/A;   VIDEO BRONCHOSCOPY WITH ENDOBRONCHIAL ULTRASOUND N/A 02/26/2018   Procedure: VIDEO BRONCHOSCOPY WITH ENDOBRONCHIAL ULTRASOUND;  Surgeon: Garner Nash, DO;  Location: [place];  Service: Thoracic;  Laterality: N/A;   VIDEO BRONCHOSCOPY WITH ENDOBRONCHIAL ULTRASOUND N/A 03/19/2018   Procedure: NAVIGATION BRONCHOSCOPY;  Surgeon: Garner Nash, DO;  Location: [facility];  Service: Thoracic;  Laterality: N/A;  Allergies  Allergen Reactions   Biaxin [Clarithromycin] Shortness Of Breath   Clarithromycin Shortness Of Breath   Iodinated Diagnostic Agents Hives   Prednisone Other (See Comments)    Patient states he turns REALLY RED all over.   Cortisone Other (See Comments)    Turns red from breast up   Flomax [Tamsulosin Hcl]     Blurred vision and lower back pain    Hepatitis B Virus Vaccines     Nerve problems States his previous doctor told him it was due to this    Iodine Hives    Dye from nuclear medicine test   Prior to Admission medications   Medication Sig Start Date End Date Taking? Authorizing Provider  aspirin EC 81 MG tablet Take 81 mg by mouth daily as needed for mild pain.   Yes [provider]  atorvastatin (LIPITOR) 40 MG tablet Take 1 tablet (40 mg total) by mouth daily. 04/09/18 04/09/19 Yes Turner, Eber Hong, MD  cetirizine (ZYRTEC) 10 MG tablet Take 10 mg by mouth daily at 12 noon.   Yes [provider]  clobetasol cream (TEMOVATE) 4.33 % Apply 1 application topically 2 (two) times  daily as needed (rash/skin irritation.).  05/20/17  Yes [provider]  clopidogrel (PLAVIX) 75 MG tablet TAKE 1 TABLET BY MOUTH EVERY DAY 07/18/18  Yes [name], MD  colchicine 0.6 MG tablet TAKE 1 TABLET BY MOUTH TWICE A DAY 05/07/18  Yes [name], MD  diphenhydrAMINE (BENADRYL) 25 MG tablet Take 25 mg by mouth every 6 (six) hours as needed.   Yes [provider]  famotidine (PEPCID) 20 MG tablet Take 20 mg by mouth 2 (two) times daily. Noon & in the evening.   Yes [provider]  lisinopril (ZESTRIL) 10 MG tablet TAKE 1 TABLET BY MOUTH EVERY DAY 09/10/18  Yes [name], MD  lisinopril (ZESTRIL) 5 MG tablet TAKE 1 TABLET BY MOUTH EVERY DAY 07/14/18  Yes [name], MD  loperamide (IMODIUM) 2 MG capsule Take 4 mg by mouth as needed for diarrhea or loose stools.   Yes [provider]  metoprolol tartrate (LOPRESSOR) 50 MG tablet TAKE 1.5 TABLETS BY MOUTH TWICE A DAY 07/13/18  Yes [name], MD  Triamcinolone Acetonide (NASACORT AQ NA) Place 1 spray into the nose daily at 2 am. (0300 or 0400)   Yes [provider]  triamcinolone cream (KENALOG) 0.1 % Apply 1 application topically 2 (two) times daily as needed. Patient taking differently: Apply 1 application topically 2 (two) times daily as needed (rash/skin irritation.).  08/29/17  Yes [name], MD   Social History   Socioeconomic History   Marital status: Married    Spouse name: Not on file   Number of children: 1   Years of education: Not on file   Highest education level: Not on file  Occupational History   Occupation: retired  Scientist, product/process development strain: Not hard at [employer] insecurity    Worry: Never true    Inability: Never true   Transportation needs    Medical: No    Non-medical: No  Tobacco Use   Smoking status: Former Smoker    Packs/day: 3.00    Years: 50.00    Pack years: 150.00    Types: Cigarettes    Quit  date: 04/05/2016    Years since quitting: 2.4   Smokeless tobacco: Never Used  Substance and Sexual  Activity   Alcohol use: Yes    Alcohol/week: 0.0 standard drinks    Comment: rare   Drug use: No   Sexual activity: Not on file  Lifestyle   Physical activity    Days per week: 0 days    Minutes per session: 0 min   Stress: Not at all  Relationships   Social connections    Talks on phone: More than three times a week    Gets together: More than three times a week    Attends religious service: Never    Active member of club or organization: No    Attends meetings of clubs or organizations: Never    Relationship status: Married   Intimate partner violence    Fear of current or ex partner: No    Emotionally abused: No    Physically abused: No    Forced sexual activity: No  Other Topics Concern   Not on file  Social History Narrative   Married   Exercise: No   Education: GED    Review of Systems     Objective:   Physical Exam Vitals signs reviewed.  Constitutional:      Appearance: He is well-developed.  HENT:     Head: Normocephalic and atraumatic.     Right Ear: Ear canal and external ear normal. No swelling. A middle ear effusion (small clear effusion at base. ) is present. There is no impacted cerumen. Tympanic membrane is not injected, scarred, perforated or erythematous.     Left Ear: Tympanic membrane, ear canal and external ear normal.     Ears:     Weber exam findings: lateralizes right.    Right Rinne: BC > AC.    Comments: No cerumen impaction, canal clear.  Weber localizes R,  BC 20s, AC 16s.  (consistent with conductive loss).   Unable to hear finger rub.  Eyes:     Pupils: Pupils are equal, round, and reactive to light.  Neck:     Vascular: No carotid bruit or JVD.  Cardiovascular:     Rate and Rhythm: Normal rate and regular rhythm.     Heart sounds: Normal heart sounds. No murmur.  Pulmonary:     Effort: Pulmonary effort is normal.      Breath sounds: Normal breath sounds. No rales.  Musculoskeletal:     Left ankle: He exhibits decreased range of motion (min) and swelling. He exhibits no ecchymosis and no laceration. Tenderness (min ttp lat/med. sts throughout ankle. ). Lateral malleolus and medial malleolus tenderness found. Achilles tendon normal.     Right lower leg: He exhibits no tenderness and no swelling. No edema.     Left lower leg: He exhibits no tenderness and no swelling. No edema.       Feet:  Skin:    General: Skin is warm and dry.     Comments: Unable to palpate DP pulse, but ca refill 1 second at toes, no wounds/ulcreations.   Neurological:     Mental Status: He is alert and oriented to person, place, and time.    Vitals:   09/18/18 1101  BP: 105/68  Pulse: 94  Resp: 14  Temp: 97.9 F (36.6 C)  TempSrc: Oral  SpO2: 97%  Weight: 205 lb 9.6 oz (93.3 kg)  No results found.  Dg Ankle Complete Left  Result Date: 09/18/2018 CLINICAL DATA:  Acute LEFT ankle pain and swelling for 3 weeks. No known injury. Initial encounter. EXAM: LEFT ANKLE COMPLETE -  3+ VIEW COMPARISON:  None. FINDINGS: No acute fracture, subluxation or dislocation. No definite ankle effusion noted. Mild soft swelling is present. No suspicious focal bony lesions are present. A small calcaneal spur is identified. IMPRESSION: Mild soft tissue swelling without acute bony abnormality. Small calcaneal spur. Electronically Signed   By: Margarette Canada M.D.   On: 09/18/2018 12:54    Over 40 mins face to face care with greater than 50% counseling, as well as second MD exam.    Assessment & Plan:   Athanasios Heldman Mcelhiney is a 73 y.o. male Decreased hearing of right ear Conductive hearing loss of right ear, unspecified hearing status on contralateral side  -Exam/testing above consistent with conductive hearing loss, likely related to congestion, eustachian tube dysfunction.  -Saline nasal spray, Flonase nasal spray discussed with RTC precautions or ENT eval  if not improving.  Recheck 3 to 4 days, sooner if worse  Left ankle swelling - Plan: DG Ankle Complete Left PVD (peripheral vascular disease) (HCC)  -Suspected PVD cause, no signs of critical limb ischemia on exam.  Second MD exam obtained. Findings on x-ray.  -Recommend he call his vascular specialist as soon as possible for evaluation, can recheck next few days.  Elevation discussed.   Essential hypertension - Plan: metoprolol tartrate (LOPRESSOR) 50 MG tablet, lisinopril (ZESTRIL) 10 MG tablet, DISCONTINUED: lisinopril (ZESTRIL) 10 MG tablet  -Slight low readings, will try lower dose of lisinopril at 10 mg daily, continue metoprolol same dose.  Home monitoring.  Recheck as above.  Malignant neoplasm of lung, unspecified laterality, unspecified part of lung ([place])  -Treated with chemo/radiation as above and ongoing follow-up with specialist.  Coronary artery disease involving native heart without angina pectoris, unspecified vessel or lesion type  -Asymptomatic/stable, continue cardiology follow-up as planned, medical management..  Hyperlipidemia, unspecified hyperlipidemia type - Plan:   -Tolerating Lipitor, continue same. Meds ordered this encounter  Medications   DISCONTD: lisinopril (ZESTRIL) 10 MG tablet    Sig: Take 1 tablet (10 mg total) by mouth daily.    Dispense:  90 tablet    Refill:  1    Please schedule follow up appointment   metoprolol tartrate (LOPRESSOR) 50 MG tablet    Sig: TAKE 1.5 TABLETS BY MOUTH TWICE A DAY    Dispense:  270 tablet    Refill:  0   lisinopril (ZESTRIL) 10 MG tablet    Sig: Take 1 tablet (10 mg total) by mouth daily.    Dispense:  90 tablet    Refill:  1   Patient Instructions    Decrease lisinopril to just 10mg  per day, continue metoprolol same dose for now for blood pressure.  Follow up with telemedicine visit in next 3 weeks - have home readings available for that appointment.   Call your vascular doctor for appointment as soon as  possible to discuss your left foot/ankle swelling as that appears to be due to circulation, not gout.  If any acute worsening of the symptoms or increasing pain, be seen right away.  Elevation when seated can be helpful.  I will let you know if there are any concerns on your x-ray.  Decreased hearing in the right ear appears to be conductive hearing loss, or likely due to some congestion.  Try the triamcinolone nasal spray 1 to 2 sprays/day, saline nasal spray few times per day if needed for congestion and recheck in the next 4 days if that is not improving. I can also refer you back to  ENT if needed if not improving.   Return to the clinic or go to the nearest emergency room if any of your symptoms worsen or new symptoms occur.    If you have lab work done today you will be contacted with your lab results within the next 2 weeks.  If you have not heard from [place] then please contact us. The fastest way to get your results is to register for My Chart.   IF you received an x-ray today, you will receive an invoice from [facility]. Please contact [facility] at [phone] with questions or concerns regarding your invoice.   IF you received labwork today, you will receive an invoice from [facility]. Please contact [facility] at [phone] with questions or concerns regarding your invoice.   Our billing staff will not be able to assist you with questions regarding bills from these companies.  You will be contacted with the lab results as soon as they are available. The fastest way to get your results is to activate your My Chart account. Instructions are located on the last page of this paperwork. If you have not heard from [place] regarding the results in 2 weeks, please contact this office.       Signed,   [name], MD [facility].  09/20/18 4:39 PM

## 2018-09-20 ENCOUNTER — Encounter: Payer: Self-pay | Admitting: Family Medicine

## 2018-09-25 DIAGNOSIS — I10 Essential (primary) hypertension: Secondary | ICD-10-CM | POA: Diagnosis not present

## 2018-09-25 DIAGNOSIS — M7989 Other specified soft tissue disorders: Secondary | ICD-10-CM | POA: Diagnosis not present

## 2018-09-25 DIAGNOSIS — I714 Abdominal aortic aneurysm, without rupture: Secondary | ICD-10-CM | POA: Diagnosis not present

## 2018-09-25 DIAGNOSIS — I739 Peripheral vascular disease, unspecified: Secondary | ICD-10-CM | POA: Diagnosis not present

## 2018-09-26 DIAGNOSIS — R600 Localized edema: Secondary | ICD-10-CM | POA: Diagnosis not present

## 2018-10-05 ENCOUNTER — Other Ambulatory Visit: Payer: Self-pay | Admitting: Family Medicine

## 2018-10-05 DIAGNOSIS — I10 Essential (primary) hypertension: Secondary | ICD-10-CM

## 2018-10-09 ENCOUNTER — Other Ambulatory Visit: Payer: Self-pay

## 2018-10-09 ENCOUNTER — Telehealth (INDEPENDENT_AMBULATORY_CARE_PROVIDER_SITE_OTHER): Payer: Medicare Other | Admitting: Family Medicine

## 2018-10-09 DIAGNOSIS — I10 Essential (primary) hypertension: Secondary | ICD-10-CM | POA: Diagnosis not present

## 2018-10-09 DIAGNOSIS — H9011 Conductive hearing loss, unilateral, right ear, with unrestricted hearing on the contralateral side: Secondary | ICD-10-CM | POA: Diagnosis not present

## 2018-10-09 DIAGNOSIS — M25472 Effusion, left ankle: Secondary | ICD-10-CM | POA: Diagnosis not present

## 2018-10-09 DIAGNOSIS — I2510 Atherosclerotic heart disease of native coronary artery without angina pectoris: Secondary | ICD-10-CM

## 2018-10-09 NOTE — Progress Notes (Signed)
CC-Follow-up- Leg swelling and hearing- Patient stated he had an appt with Cardiologist on 10/02/18 then done US of the left leg and foot. [place] neg for blood clot. They also stated the swelling was not heart related. Still have some swelling. Hearing is much better. Blood pressure has been averaging 133/82-162/92 heart rate 51-74

## 2018-10-09 NOTE — Progress Notes (Signed)
Virtual Visit via Telephone Note  I connected with Seth [name] on 10/09/18 at 12:24 PM by telephone and verified that I am speaking with the correct person using two identifiers.   I discussed the limitations, risks, security and privacy concerns of performing an evaluation and management service by telephone and the availability of in person appointments. I also discussed with the patient that there may be a patient responsible charge related to this service. The patient expressed understanding and agreed to proceed, consent obtained  Chief complaint: Leg swelling  History of Present Illness:Seth [name] is a 73 y.o. male  Follow-up from office visit on August 13.  Multiple concerns discussed at that time.  Left leg swelling with history of PVD.: Suspected PVD because, no signs of critical limb ischemia at last visit. Evaluated August 20 at vascular surgeon. Focal swelling of the foot was not thought to be arterial or venous in etiology, leg elevation discussed.  Ultrasound performed on August 21, reportedly negative for DVT.  Still a little swollen, but a little better.  Had similar swelling 25 yrs ago that went away on its own.  Exercising for 15 minutes at a time, swelling improves.    Hypertension: BP Readings from Last 3 Encounters:  09/18/18 105/68  07/14/18 131/75  06/16/18 134/66   Lab Results  Component Value Date   CREATININE 1.15 07/11/2018  BP running on the lower side, decreased his lisinopril to 10 mg daily, continued metoprolol same dose last visit. Home readings:133/84, HR 64 today., 135/80 HR 69 at vascular.  Highest few days ago - 164/92. HR 58.  Concerned about HR - low of 51, high 74.  Not feeling lighteaded or dizzy, no new symptoms.    Decreased hearing of right ear: Suspected conductive loss based on exam in office, possibly related to eustachian tube dysfunction/congestion.  Recommended steroid nasal spray, saline nasal spray with ENT eval if not  improving.  Nasal spray made a big difference even after a day. Still taking nasacort daily, almost back to normal.        Patient Active Problem List   Diagnosis Date Noted  . Frequent nosebleeds 05/28/2018  . Encounter for antineoplastic chemotherapy 04/18/2018  . Goals of care, counseling/discussion 04/18/2018  . Adenocarcinoma of left lung, stage 2 ([place]) 03/27/2018  . Abnormal PET of left lung   . S/P bronchoscopy   . Solitary lung nodule 02/20/2018  . Hilar adenopathy 02/20/2018  . Abnormal findings on diagnostic imaging of lung 02/20/2018  . Platelet inhibition due to Plavix 02/20/2018  . PVD (peripheral vascular disease) ([place]) 10/15/2013  . Tobacco use disorder 10/15/2013  . Pure hypercholesterolemia 10/15/2013  . Coronary artery disease involving native coronary artery of native heart without angina pectoris 10/15/2013  . Cataract 09/29/2013  . Colon cancer screening 09/25/2013  . AAA (abdominal aortic aneurysm) without rupture ([place]) 10/22/2012  . Nicotine addiction 10/17/2012  . Atherosclerosis of native arteries of the extremities with ulceration(440.23) 10/17/2012  . Carotid bruit 10/17/2012  . Other nonspecific abnormal cardiovascular system function study 04/10/2012  . History of sciatica   . Essential hypertension, benign   . Seasonal allergies    Past Medical History:  Diagnosis Date  . AAA (abdominal aortic aneurysm) ([facility])   . Allergic rhinitis   . Anxiety   . Back pain   . CAD (coronary artery disease)   . Cataracts, bilateral   . Emphysema, unspecified ([place])     " moderate"  . Essential hypertension,  benign   . GERD (gastroesophageal reflux disease)   . Gout   . Headache    migraines  . Heart murmur    as a child only  . Hilar adenopathy   . History of hiatal hernia   . History of sciatica   . Hyperlipidemia   . lung ca dx'd 02/2018  . Lung nodule   . Pneumonia    as child  . Prostate hypertrophy   . PVD (peripheral vascular disease) ([place])    . Seasonal allergies   . Subclavian steal syndrome of left subclavian artery   . Wears dentures   . Wears dentures    Past Surgical History:  Procedure Laterality Date  . CARDIAC CATHETERIZATION     stent placement  . COLONOSCOPY W/ BIOPSIES AND POLYPECTOMY    . ILIAC ARTERY STENT    . MULTIPLE TOOTH EXTRACTIONS    . VIDEO BRONCHOSCOPY WITH ENDOBRONCHIAL NAVIGATION N/A 02/26/2018   Procedure: VIDEO BRONCHOSCOPY WITH ENDOBRONCHIAL NAVIGATION;  Surgeon: Garner Nash, DO;  Location: [facility];  Service: Thoracic;  Laterality: N/A;  . VIDEO BRONCHOSCOPY WITH ENDOBRONCHIAL NAVIGATION N/A 03/19/2018   Procedure: VIDEO BRONCHOSCOPY WITH ENDOBRONCHIAL NAVIGATION and endobronchial ultrasound;  Surgeon: Garner Nash, DO;  Location: [facility];  Service: Thoracic;  Laterality: N/A;  . VIDEO BRONCHOSCOPY WITH ENDOBRONCHIAL ULTRASOUND N/A 02/26/2018   Procedure: VIDEO BRONCHOSCOPY WITH ENDOBRONCHIAL ULTRASOUND;  Surgeon: Garner Nash, DO;  Location: [facility];  Service: Thoracic;  Laterality: N/A;  . VIDEO BRONCHOSCOPY WITH ENDOBRONCHIAL ULTRASOUND N/A 03/19/2018   Procedure: NAVIGATION BRONCHOSCOPY;  Surgeon: Garner Nash, DO;  Location[facility];  Service: Thoracic;  Laterality: N/A;   Allergies  Allergen Reactions  . Biaxin [Clarithromycin] Shortness Of Breath  . Clarithromycin Shortness Of Breath  . Iodinated Diagnostic Agents Hives  . Prednisone Other (See Comments)    Patient states he turns REALLY RED all over.  . Cortisone Other (See Comments)    Turns red from breast up  . Flomax [Tamsulosin Hcl]     Blurred vision and lower back pain   . Hepatitis B Virus Vaccines     Nerve problems States his previous doctor told him it was due to this   . Iodine Hives    Dye from nuclear medicine test   Prior to Admission medications   Medication Sig Start Date End Date Taking? Authorizing Provider  aspirin EC 81 MG tablet Take 81 mg by mouth daily as needed for mild pain.   Yes [provider]  atorvastatin (LIPITOR) 40 MG tablet Take 1 tablet (40 mg total) by mouth daily. 04/09/18 04/09/19 Yes Turner, Eber Hong, MD  cetirizine (ZYRTEC) 10 MG tablet Take 10 mg by mouth daily at 12 noon.   Yes [provider]  clobetasol cream (TEMOVATE) 4.09 % Apply 1 application topically 2 (two) times daily as needed (rash/skin irritation.).  05/20/17  Yes [provider]  clopidogrel (PLAVIX) 75 MG tablet TAKE 1 TABLET BY MOUTH EVERY DAY 07/18/18  Yes [name], MD  colchicine 0.6 MG tablet TAKE 1 TABLET BY MOUTH TWICE A DAY 05/07/18  Yes [name], MD  diphenhydrAMINE (BENADRYL) 25 MG tablet Take 25 mg by mouth every 6 (six) hours as needed.   Yes [provider]  famotidine (PEPCID) 20 MG tablet Take 20 mg by mouth 2 (two) times daily. Noon & in the evening.   Yes [provider]  lisinopril (ZESTRIL) 10 MG tablet Take 1 tablet (  10 mg total) by mouth daily. 09/18/18  Yes [name], MD  loperamide (IMODIUM) 2 MG capsule Take 4 mg by mouth as needed for diarrhea or loose stools.   Yes [provider]  metoprolol tartrate (LOPRESSOR) 50 MG tablet TAKE 1.5 TABLETS BY MOUTH TWICE A DAY 09/18/18  Yes [name], MD  Triamcinolone Acetonide (NASACORT AQ NA) Place 1 spray into the nose daily at 2 am. (0300 or 0400)   Yes [provider]  triamcinolone cream (KENALOG) 0.1 % Apply 1 application topically 2 (two) times daily as needed. Patient taking differently: Apply 1 application topically 2 (two) times daily as needed (rash/skin irritation.).  08/29/17  Yes [name], MD   Social History   Socioeconomic History  . Marital status: Married    Spouse name: Not on file  . Number of children: 1  . Years of education: Not on file  . Highest education level: Not on file  Occupational History  . Occupation: retired  Scientific laboratory technician  . Financial resource strain: Not hard at all  . Food insecurity    Worry: Never  true    Inability: Never true  . Transportation needs    Medical: No    Non-medical: No  Tobacco Use  . Smoking status: Former Smoker    Packs/day: 3.00    Years: 50.00    Pack years: 150.00    Types: Cigarettes    Quit date: 04/05/2016    Years since quitting: 2.5  . Smokeless tobacco: Never Used  Substance and Sexual Activity  . Alcohol use: Yes    Alcohol/week: 0.0 standard drinks    Comment: rare  . Drug use: No  . Sexual activity: Not on file  Lifestyle  . Physical activity    Days per week: 0 days    Minutes per session: 0 min  . Stress: Not at all  Relationships  . Social connections    Talks on phone: More than three times a week    Gets together: More than three times a week    Attends religious service: Never    Active member of club or organization: No    Attends meetings of clubs or organizations: Never    Relationship status: Married  . Intimate partner violence    Fear of current or ex partner: No    Emotionally abused: No    Physically abused: No    Forced sexual activity: No  Other Topics Concern  . Not on file  Social History Narrative   Married   Exercise: No   Education: GED     Observations/Objective: No distress. Home vitals as above.   There were no vitals filed for this visit.   Assessment and Plan: Conductive hearing loss of right ear, unspecified hearing status on contralateral side  -Improved, suspected sinus congestion/eustachian tube component.  Continue Nasacort.  Left ankle swelling  -Status post vascular eval, ultrasound without concerns.  Some improvement.  Elevation, continue symptomatic care with RTC precautions  Essential hypertension  -Significant home variability, recommended an office evaluation in the next week for repeat testing and bring home meter for verification of accuracy.  No med changes for now  Follow Up Instructions: 1 week.    I discussed the assessment and treatment plan with the patient. The patient  was provided an opportunity to ask questions and all were answered. The patient agreed with the plan and demonstrated an understanding of the instructions.   The patient  was advised to call back or seek an in-person evaluation if the symptoms worsen or if the condition fails to improve as anticipated.  I provided 13 minutes of non-face-to-face time during this encounter.  Signed,   [name], MD [facility].  10/09/18

## 2018-10-09 NOTE — Patient Instructions (Addendum)
Follow up in next few weeks if foot swelling is not continuing to improve.  Follow up in office next week and bring BP machine with you as well as record of home readings.   Continue nasacort for now same dose - I'm glad to hear that hearing has improved.  Return to the clinic or go to the nearest emergency room if any of your symptoms worsen or new symptoms occur.

## 2018-10-14 ENCOUNTER — Ambulatory Visit (HOSPITAL_COMMUNITY)
Admission: RE | Admit: 2018-10-14 | Discharge: 2018-10-14 | Disposition: A | Discharge status: Home / Self Care | Payer: Medicare Other | Source: Ambulatory Visit | Attending: Internal Medicine | Admitting: Internal Medicine

## 2018-10-14 ENCOUNTER — Other Ambulatory Visit: Payer: Self-pay

## 2018-10-14 ENCOUNTER — Encounter (HOSPITAL_COMMUNITY): Payer: Self-pay

## 2018-10-14 ENCOUNTER — Inpatient Hospital Stay: Payer: Medicare Other | Attending: Internal Medicine

## 2018-10-14 DIAGNOSIS — R5383 Other fatigue: Secondary | ICD-10-CM | POA: Diagnosis not present

## 2018-10-14 DIAGNOSIS — I700 Atherosclerosis of aorta: Secondary | ICD-10-CM | POA: Insufficient documentation

## 2018-10-14 DIAGNOSIS — Z888 Allergy status to other drugs, medicaments and biological substances status: Secondary | ICD-10-CM | POA: Diagnosis not present

## 2018-10-14 DIAGNOSIS — I739 Peripheral vascular disease, unspecified: Secondary | ICD-10-CM | POA: Diagnosis not present

## 2018-10-14 DIAGNOSIS — I10 Essential (primary) hypertension: Secondary | ICD-10-CM | POA: Insufficient documentation

## 2018-10-14 DIAGNOSIS — R042 Hemoptysis: Secondary | ICD-10-CM | POA: Insufficient documentation

## 2018-10-14 DIAGNOSIS — I2510 Atherosclerotic heart disease of native coronary artery without angina pectoris: Secondary | ICD-10-CM | POA: Diagnosis not present

## 2018-10-14 DIAGNOSIS — E279 Disorder of adrenal gland, unspecified: Secondary | ICD-10-CM | POA: Diagnosis not present

## 2018-10-14 DIAGNOSIS — Z79899 Other long term (current) drug therapy: Secondary | ICD-10-CM | POA: Insufficient documentation

## 2018-10-14 DIAGNOSIS — C3411 Malignant neoplasm of upper lobe, right bronchus or lung: Secondary | ICD-10-CM | POA: Diagnosis not present

## 2018-10-14 DIAGNOSIS — R590 Localized enlarged lymph nodes: Secondary | ICD-10-CM | POA: Insufficient documentation

## 2018-10-14 DIAGNOSIS — R0981 Nasal congestion: Secondary | ICD-10-CM | POA: Diagnosis not present

## 2018-10-14 DIAGNOSIS — C3490 Malignant neoplasm of unspecified part of unspecified bronchus or lung: Secondary | ICD-10-CM

## 2018-10-14 DIAGNOSIS — J439 Emphysema, unspecified: Secondary | ICD-10-CM | POA: Diagnosis not present

## 2018-10-14 DIAGNOSIS — R0982 Postnasal drip: Secondary | ICD-10-CM | POA: Diagnosis not present

## 2018-10-14 DIAGNOSIS — J984 Other disorders of lung: Secondary | ICD-10-CM | POA: Insufficient documentation

## 2018-10-14 LAB — CBC WITH DIFFERENTIAL (CANCER CENTER ONLY)
Abs Immature Granulocytes: 0.02 10*3/uL (ref 0.00–0.07)
Basophils Absolute: 0 10*3/uL (ref 0.0–0.1)
Basophils Relative: 0 %
Eosinophils Absolute: 0.2 10*3/uL (ref 0.0–0.5)
Eosinophils Relative: 3 %
HCT: 42.9 % (ref 39.0–52.0)
Hemoglobin: 14.3 g/dL (ref 13.0–17.0)
Immature Granulocytes: 0 %
Lymphocytes Relative: 19 %
Lymphs Abs: 1.3 10*3/uL (ref 0.7–4.0)
MCH: 30.8 pg (ref 26.0–34.0)
MCHC: 33.3 g/dL (ref 30.0–36.0)
MCV: 92.5 fL (ref 80.0–100.0)
Monocytes Absolute: 0.5 10*3/uL (ref 0.1–1.0)
Monocytes Relative: 8 %
Neutro Abs: 4.7 10*3/uL (ref 1.7–7.7)
Neutrophils Relative %: 70 %
Platelet Count: 185 10*3/uL (ref 150–400)
RBC: 4.64 MIL/uL (ref 4.22–5.81)
RDW: 13 % (ref 11.5–15.5)
WBC Count: 6.7 10*3/uL (ref 4.0–10.5)
nRBC: 0 % (ref 0.0–0.2)

## 2018-10-14 LAB — CMP (CANCER CENTER ONLY)
ALT: 9 U/L (ref 0–44)
AST: 12 U/L — ABNORMAL LOW (ref 15–41)
Albumin: 3.8 g/dL (ref 3.5–5.0)
Alkaline Phosphatase: 116 U/L (ref 38–126)
Anion gap: 11 (ref 5–15)
BUN: 8 mg/dL (ref 8–23)
CO2: 21 mmol/L — ABNORMAL LOW (ref 22–32)
Calcium: 8.7 mg/dL — ABNORMAL LOW (ref 8.9–10.3)
Chloride: 106 mmol/L (ref 98–111)
Creatinine: 1.18 mg/dL (ref 0.61–1.24)
GFR, Est AFR Am: >60 mL/min (ref >60)
GFR, Estimated: >60 mL/min (ref >60)
Glucose, Bld: 120 mg/dL — ABNORMAL HIGH (ref 70–99)
Potassium: 3.9 mmol/L (ref 3.5–5.1)
Sodium: 138 mmol/L (ref 135–145)
Total Bilirubin: 0.4 mg/dL (ref 0.3–1.2)
Total Protein: 6.4 g/dL — ABNORMAL LOW (ref 6.5–8.1)

## 2018-10-16 ENCOUNTER — Other Ambulatory Visit: Payer: Self-pay

## 2018-10-16 ENCOUNTER — Encounter: Payer: Self-pay | Admitting: Internal Medicine

## 2018-10-16 ENCOUNTER — Inpatient Hospital Stay (HOSPITAL_BASED_OUTPATIENT_CLINIC_OR_DEPARTMENT_OTHER): Payer: Medicare Other | Admitting: Internal Medicine

## 2018-10-16 ENCOUNTER — Ambulatory Visit (INDEPENDENT_AMBULATORY_CARE_PROVIDER_SITE_OTHER): Payer: Medicare Other | Admitting: Family Medicine

## 2018-10-16 ENCOUNTER — Telehealth: Payer: Self-pay | Admitting: Internal Medicine

## 2018-10-16 VITALS — BP 146/74 | HR 65 | Temp 99.1°F | Resp 20 | Ht 73.0 in | Wt 205.7 lb

## 2018-10-16 VITALS — BP 126/70 | HR 62 | Temp 98.1°F | Resp 14 | Wt 206.0 lb

## 2018-10-16 DIAGNOSIS — I10 Essential (primary) hypertension: Secondary | ICD-10-CM | POA: Diagnosis not present

## 2018-10-16 DIAGNOSIS — R042 Hemoptysis: Secondary | ICD-10-CM | POA: Diagnosis not present

## 2018-10-16 DIAGNOSIS — C3492 Malignant neoplasm of unspecified part of left bronchus or lung: Secondary | ICD-10-CM | POA: Diagnosis not present

## 2018-10-16 DIAGNOSIS — I2510 Atherosclerotic heart disease of native coronary artery without angina pectoris: Secondary | ICD-10-CM | POA: Diagnosis not present

## 2018-10-16 DIAGNOSIS — R0982 Postnasal drip: Secondary | ICD-10-CM | POA: Diagnosis not present

## 2018-10-16 DIAGNOSIS — R001 Bradycardia, unspecified: Secondary | ICD-10-CM | POA: Diagnosis not present

## 2018-10-16 DIAGNOSIS — C3490 Malignant neoplasm of unspecified part of unspecified bronchus or lung: Secondary | ICD-10-CM | POA: Diagnosis not present

## 2018-10-16 DIAGNOSIS — R5383 Other fatigue: Secondary | ICD-10-CM | POA: Diagnosis not present

## 2018-10-16 DIAGNOSIS — R590 Localized enlarged lymph nodes: Secondary | ICD-10-CM | POA: Diagnosis not present

## 2018-10-16 DIAGNOSIS — I700 Atherosclerosis of aorta: Secondary | ICD-10-CM | POA: Diagnosis not present

## 2018-10-16 DIAGNOSIS — C3411 Malignant neoplasm of upper lobe, right bronchus or lung: Secondary | ICD-10-CM | POA: Diagnosis not present

## 2018-10-16 MED ORDER — PROCHLORPERAZINE MALEATE 10 MG PO TABS
ORAL_TABLET | ORAL | Status: AC
  Filled 2018-10-16: qty 1

## 2018-10-16 MED ORDER — METHYLPREDNISOLONE SODIUM SUCC 125 MG IJ SOLR
INTRAMUSCULAR | Status: AC
  Filled 2018-10-16: qty 2

## 2018-10-16 MED ORDER — FAMOTIDINE IN NACL 20-0.9 MG/50ML-% IV SOLN
INTRAVENOUS | Status: AC
  Filled 2018-10-16: qty 50

## 2018-10-16 MED ORDER — DIPHENHYDRAMINE HCL 50 MG/ML IJ SOLN
INTRAMUSCULAR | Status: AC
  Filled 2018-10-16: qty 1

## 2018-10-16 NOTE — Progress Notes (Signed)
Seth [name] [phone]   Fax:([phone]) [phone]  OFFICE PROGRESS NOTE  [name], MD [place] [id number]  DIAGNOSIS: Stage IIB (T1c, N1, M0) non-small cell lung cancer favoring adenocarcinoma presented with right upper lobe lung nodule in addition to right hilar lymphadenopathy diagnosed in February 2020.  The patient is not a good surgical candidate for resection.  PRIOR THERAPY: Concurrent chemoradiation with weekly carboplatin for AUC of 2 and paclitaxel 45 mg/M2.  First dose May 05, 2018.  Status post 7 cycles.  Last dose was given Jun 16, 2018  CURRENT THERAPY: Observation.  INTERVAL HISTORY: Seth [name] 73 y.o. male returns to the clinic today for follow-up visit.  The patient is feeling fine except for fatigue and postnasal drainage with occasional blood-tinged sputum.  He denied having any chest pain, shortness of breath, cough or hemoptysis.  He denied having any fever or chills.  He has no nausea, vomiting, diarrhea or constipation.  He denied having any headache or visual changes.  He had repeat CT scan of the chest performed recently and he is here for evaluation and discussion of his scan results.  MEDICAL HISTORY: Past Medical History:  Diagnosis Date   AAA (abdominal aortic aneurysm) ([facility])    Allergic rhinitis    Anxiety    Back pain    CAD (coronary artery disease)    Cataracts, bilateral    Emphysema, unspecified ([place])     " moderate"   Essential hypertension, benign    GERD (gastroesophageal reflux disease)    Gout    Headache    migraines   Heart murmur    as a child only   Hilar adenopathy    History of hiatal hernia    History of sciatica    Hyperlipidemia    lung ca dx'd 02/2018   Lung nodule    Pneumonia    as child   Prostate hypertrophy    PVD (peripheral vascular disease) ([place])    Seasonal allergies    Subclavian steal syndrome of left subclavian artery    Wears  dentures    Wears dentures     ALLERGIES:  is allergic to biaxin [clarithromycin]; clarithromycin; iodinated diagnostic agents; prednisone; cortisone; flomax [tamsulosin hcl]; hepatitis b virus vaccines; and iodine.  MEDICATIONS:  Current Outpatient Medications  Medication Sig Dispense Refill   aspirin EC 81 MG tablet Take 81 mg by mouth daily as needed for mild pain.     atorvastatin (LIPITOR) 40 MG tablet Take 1 tablet (40 mg total) by mouth daily. 90 tablet 3   cetirizine (ZYRTEC) 10 MG tablet Take 10 mg by mouth daily at 12 noon.     clobetasol cream (TEMOVATE) 1.69 % Apply 1 application topically 2 (two) times daily as needed (rash/skin irritation.).      clopidogrel (PLAVIX) 75 MG tablet TAKE 1 TABLET BY MOUTH EVERY DAY 90 tablet 1   colchicine 0.6 MG tablet TAKE 1 TABLET BY MOUTH TWICE A DAY 180 tablet 1   diphenhydrAMINE (BENADRYL) 25 MG tablet Take 25 mg by mouth every 6 (six) hours as needed.     famotidine (PEPCID) 20 MG tablet Take 20 mg by mouth 2 (two) times daily. Noon & in the evening.     lisinopril (ZESTRIL) 10 MG tablet Take 1 tablet (10 mg total) by mouth daily. 90 tablet 1   loperamide (IMODIUM) 2 MG capsule Take 4 mg by mouth as needed for diarrhea  or loose stools.     metoprolol tartrate (LOPRESSOR) 50 MG tablet TAKE 1.5 TABLETS BY MOUTH TWICE A DAY 270 tablet 0   Triamcinolone Acetonide (NASACORT AQ NA) Place 1 spray into the nose daily at 2 am. (0300 or 0400)     triamcinolone cream (KENALOG) 0.1 % Apply 1 application topically 2 (two) times daily as needed. (Patient taking differently: Apply 1 application topically 2 (two) times daily as needed (rash/skin irritation.). ) 30 g 0   Current Facility-Administered Medications  Medication Dose Route Frequency Provider Last Rate Last Dose   0.9 %  sodium chloride infusion  500 mL Intravenous Once Irene Shipper, MD        SURGICAL HISTORY:  Past Surgical History:  Procedure Laterality Date   CARDIAC  CATHETERIZATION     stent placement   COLONOSCOPY W/ BIOPSIES AND POLYPECTOMY     ILIAC ARTERY STENT     MULTIPLE TOOTH EXTRACTIONS     VIDEO BRONCHOSCOPY WITH ENDOBRONCHIAL NAVIGATION N/A 02/26/2018   Procedure: VIDEO BRONCHOSCOPY WITH ENDOBRONCHIAL NAVIGATION;  Surgeon: Garner Nash, DO;  Location: [facility];  Service: Thoracic;  Laterality: N/A;   VIDEO BRONCHOSCOPY WITH ENDOBRONCHIAL NAVIGATION N/A 03/19/2018   Procedure: VIDEO BRONCHOSCOPY WITH ENDOBRONCHIAL NAVIGATION and endobronchial ultrasound;  Surgeon: Garner Nash, DO;  Location: [facility];  Service: Thoracic;  Laterality: N/A;   VIDEO BRONCHOSCOPY WITH ENDOBRONCHIAL ULTRASOUND N/A 02/26/2018   Procedure: VIDEO BRONCHOSCOPY WITH ENDOBRONCHIAL ULTRASOUND;  Surgeon: Garner Nash, DO;  Location: [facility];  Service: Thoracic;  Laterality: N/A;   VIDEO BRONCHOSCOPY WITH ENDOBRONCHIAL ULTRASOUND N/A 03/19/2018   Procedure: NAVIGATION BRONCHOSCOPY;  Surgeon: Garner Nash, DO;  Location: [facility];  Service: Thoracic;  Laterality: N/A;    REVIEW OF SYSTEMS:  A comprehensive review of systems was negative except for: Constitutional: positive for fatigue Ears, nose, mouth, throat, and face: positive for nasal congestion Respiratory: positive for cough   PHYSICAL EXAMINATION: General appearance: alert, cooperative and no distress Head: Normocephalic, without obvious abnormality, atraumatic Neck: no adenopathy, no JVD, supple, symmetrical, trachea midline and thyroid not enlarged, symmetric, no tenderness/mass/nodules Lymph nodes: Cervical, supraclavicular, and axillary nodes normal. Resp: clear to auscultation bilaterally Back: symmetric, no curvature. ROM normal. No CVA tenderness. Cardio: regular rate and rhythm, S1, S2 normal, no murmur, click, rub or gallop GI: soft, non-tender; bowel sounds normal; no masses,  no organomegaly Extremities: extremities normal, atraumatic, no cyanosis or edema  ECOG PERFORMANCE STATUS: 1 -  Symptomatic but completely ambulatory  Blood pressure (!) 146/74, pulse 65, temperature 99.1 F (37.3 C), temperature source Oral, resp. rate 20, height 6\' 1"  (1.854 m), weight 205 lb 11.2 oz (93.3 kg), SpO2 98 %.  LABORATORY DATA: Lab Results  Component Value Date   WBC 6.7 10/14/2018   HGB 14.3 10/14/2018   HCT 42.9 10/14/2018   MCV 92.5 10/14/2018   PLT 185 10/14/2018      Chemistry      Component Value Date/Time   NA 138 10/14/2018 0753   NA 140 04/28/2018 1647   K 3.9 10/14/2018 0753   CL 106 10/14/2018 0753   CO2 21 (L) 10/14/2018 0753   BUN 8 10/14/2018 0753   BUN 9 04/28/2018 1647   CREATININE 1.18 10/14/2018 0753   CREATININE 1.09 03/23/2015 0928      Component Value Date/Time   CALCIUM 8.7 (L) 10/14/2018 0753   ALKPHOS 116 10/14/2018 0753   AST 12 (L) 10/14/2018 0753   ALT 9 10/14/2018 0753  BILITOT 0.4 10/14/2018 0753       RADIOGRAPHIC STUDIES: Dg Ankle Complete Left  Result Date: 09/18/2018 CLINICAL DATA:  Acute LEFT ankle pain and swelling for 3 weeks. No known injury. Initial encounter. EXAM: LEFT ANKLE COMPLETE - 3+ VIEW COMPARISON:  None. FINDINGS: No acute fracture, subluxation or dislocation. No definite ankle effusion noted. Mild soft swelling is present. No suspicious focal bony lesions are present. A small calcaneal spur is identified. IMPRESSION: Mild soft tissue swelling without acute bony abnormality. Small calcaneal spur. Electronically Signed   By: Margarette Canada M.D.   On: 09/18/2018 12:54   Ct Chest Wo Contrast  Result Date: 10/14/2018 CLINICAL DATA:  Non-small cell lung cancer restaging EXAM: CT CHEST WITHOUT CONTRAST TECHNIQUE: Multidetector CT imaging of the chest was performed following the standard protocol without IV contrast. COMPARISON:  Multiple exams, including 07/11/2018 FINDINGS: Cardiovascular: Coronary, aortic arch, and branch vessel atherosclerotic vascular disease. Mediastinum/Nodes: No pathologic adenopathy identified.  Lungs/Pleura: Biapical pleuroparenchymal scarring. Centrilobular emphysema. Lingular nodule measures 0.9 cm in diameter on image 84/7, formerly about 1.3 cm in diameter. There still airway plugging just distal to the lingular nodule along with some distal atelectasis or scarring in the lingula. In addition, there patchy ground-glass opacities in the left upper lobe and lower lobe the vertical level of the pulmonary nodule. This may reflect radiation pneumonitis given the proximity to the vertical level of the pulmonary nodule, correlate with patient history. Upper Abdomen: New 1.6 by 1.7 cm left adrenal mass along the medial limb, image 156/2. Internal density 36 Hounsfield units. New 2.1 by 1.5 cm right adrenal mass, internal density 36 Hounsfield units. Musculoskeletal: Unremarkable IMPRESSION: 1. Lingular nodule has reduced from previously about 1.3 cm in diameter on 07/11/2018 to current approximately 0.9 cm in diameter. There still some airway plugging distal to the nodule, and there are new patchy ground-glass opacities in the left upper lobe and left lower lobe at the vertical level of the pulmonary nodule. These patchy opacities are probably from radiation pneumonitis given the proximity to the nodule, correlate with treatment history. 2. New bilateral adrenal masses with nonspecific density characteristics. Although possibility such as adrenal hemorrhage exist, the appearance is concerning for the possibility of metastatic lung cancer in this setting. Nuclear medicine PET-CT may be helpful in further characterization. 3. Other imaging findings of potential clinical significance: Aortic Atherosclerosis (ICD10-I70.0) and Emphysema (ICD10-J43.9). Coronary atherosclerosis. Electronically Signed   By: Van Clines M.D.   On: 10/14/2018 08:58    ASSESSMENT AND PLAN: This is a very pleasant 73 years old white male recently diagnosed with unresectable stage IIb non-small cell lung cancer, adenocarcinoma  presented with right upper lobe lung nodule in addition to right hilar lymphadenopathy diagnosed in February 2020.  The patient is now a good candidate for surgical resection. The patient underwent a course of concurrent chemoradiation with weekly carboplatin and paclitaxel status post 7 cycles completed in May 2020. The patient is currently on observation and he is feeling fine with no concerning complaints except for fatigue and occasional blood-tinged sputum and postnasal drainage. He had repeat CT scan of the chest performed recently.  I personally and independently reviewed the scan images and discussed the results with the patient today. Has a scan showed further decrease in the size of the lingular nodule but the patient has new bilateral adrenal masses with nonspecific density questionable for adrenal hemorrhage but metastatic disease was not excluded. I discussed with the patient the option of proceeding with a  PET scan versus continuous observation and repeat imaging studies in 3 months. The patient agreed to the option of continuous observation and repeat imaging studies in 3 months. I will see him back for follow-up visit at that time with repeat CT scan of the chest and abdomen and pelvis. He was advised to call immediately if he has any concerning symptoms in the interval. The patient voices understanding of current disease status and treatment options and is in agreement with the current care plan. All questions were answered. The patient knows to call the clinic with any problems, questions or concerns. We can certainly see the patient much sooner if necessary.  Disclaimer: This note was dictated with voice recognition software. Similar sounding words can inadvertently be transcribed and may not be corrected upon review.

## 2018-10-16 NOTE — Progress Notes (Signed)
Subjective:    Patient ID: Seth [name], male    DOB: 08/26/1945, 73 y.o.   MRN: [id number]  HPI Seth [name] is a 74 y.o. male Presents today for: Chief Complaint  Patient presents with  . Hypertension    here for a f/u on blood pressure. Patient bp with his machine in the office was 141/85    Hypertension: BP Readings from Last 3 Encounters:  10/16/18 126/70  10/16/18 (!) 146/74  09/18/18 105/68   Lab Results  Component Value Date   CREATININE 1.18 10/14/2018  Blood pressure was running on the low side on 8/13 visit.  Lisinopril decreased to 10 mg daily, continued metoprolol same dose.  Reported significant variation in blood pressures at telemedicine visit 1 week ago - up to 164/92, 135/80.  Heart rate was also variable from 51-74. Slightly elevated at oncology visit today.   Home machine read similar this am at Oncology eval. HR 65 at that time.  Lowest HR in 50's. Not feeling lightheaded/dizzy at that time. Usual heart rate of 58-64 average. Min sleepy feeling just after taking meds only.   HR 53 on EKG in January, sinus bradycardia.  Small amt of blood in sputum past 2-3 days. D/w oncologist today and said not due to his lungs - ENT eval recommended if persists.  Stopped nasacort last night - no blood today.    Patient Active Problem List   Diagnosis Date Noted  . Frequent nosebleeds 05/28/2018  . Encounter for antineoplastic chemotherapy 04/18/2018  . Goals of care, counseling/discussion 04/18/2018  . Adenocarcinoma of left lung, stage 2 ([place]) 03/27/2018  . Abnormal PET of left lung   . S/P bronchoscopy   . Solitary lung nodule 02/20/2018  . Hilar adenopathy 02/20/2018  . Abnormal findings on diagnostic imaging of lung 02/20/2018  . Platelet inhibition due to Plavix 02/20/2018  . PVD (peripheral vascular disease) ([place]) 10/15/2013  . Tobacco use disorder 10/15/2013  . Pure hypercholesterolemia 10/15/2013  . Coronary artery disease involving native coronary  artery of native heart without angina pectoris 10/15/2013  . Cataract 09/29/2013  . Colon cancer screening 09/25/2013  . AAA (abdominal aortic aneurysm) without rupture ([place]) 10/22/2012  . Nicotine addiction 10/17/2012  . Atherosclerosis of native arteries of the extremities with ulceration(440.23) 10/17/2012  . Carotid bruit 10/17/2012  . Other nonspecific abnormal cardiovascular system function study 04/10/2012  . History of sciatica   . Essential hypertension, benign   . Seasonal allergies    Past Medical History:  Diagnosis Date  . AAA (abdominal aortic aneurysm) ([place])   . Allergic rhinitis   . Anxiety   . Back pain   . CAD (coronary artery disease)   . Cataracts, bilateral   . Emphysema, unspecified ([place])     " moderate"  . Essential hypertension, benign   . GERD (gastroesophageal reflux disease)   . Gout   . Headache    migraines  . Heart murmur    as a child only  . Hilar adenopathy   . History of hiatal hernia   . History of sciatica   . Hyperlipidemia   . lung ca dx'd 02/2018  . Lung nodule   . Pneumonia    as child  . Prostate hypertrophy   . PVD (peripheral vascular disease) ([place])   . Seasonal allergies   . Subclavian steal syndrome of left subclavian artery   . Wears dentures   . Wears dentures    Past Surgical History:  Procedure  Laterality Date  . CARDIAC CATHETERIZATION     stent placement  . COLONOSCOPY W/ BIOPSIES AND POLYPECTOMY    . ILIAC ARTERY STENT    . MULTIPLE TOOTH EXTRACTIONS    . VIDEO BRONCHOSCOPY WITH ENDOBRONCHIAL NAVIGATION N/A 02/26/2018   Procedure: VIDEO BRONCHOSCOPY WITH ENDOBRONCHIAL NAVIGATION;  Surgeon: Garner Nash, DO;  Location: [facility];  Service: Thoracic;  Laterality: N/A;  . VIDEO BRONCHOSCOPY WITH ENDOBRONCHIAL NAVIGATION N/A 03/19/2018   Procedure: VIDEO BRONCHOSCOPY WITH ENDOBRONCHIAL NAVIGATION and endobronchial ultrasound;  Surgeon: Garner Nash, DO;  Location: [facility];  Service: Thoracic;  Laterality: N/A;  .  VIDEO BRONCHOSCOPY WITH ENDOBRONCHIAL ULTRASOUND N/A 02/26/2018   Procedure: VIDEO BRONCHOSCOPY WITH ENDOBRONCHIAL ULTRASOUND;  Surgeon: Garner Nash, DO;  Location: [facility];  Service: Thoracic;  Laterality: N/A;  . VIDEO BRONCHOSCOPY WITH ENDOBRONCHIAL ULTRASOUND N/A 03/19/2018   Procedure: NAVIGATION BRONCHOSCOPY;  Surgeon: Garner Nash, DO;  Location: [facility];  Service: Thoracic;  Laterality: N/A;   Allergies  Allergen Reactions  . Biaxin [Clarithromycin] Shortness Of Breath  . Clarithromycin Shortness Of Breath  . Iodinated Diagnostic Agents Hives  . Prednisone Other (See Comments)    Patient states he turns REALLY RED all over.  . Cortisone Other (See Comments)    Turns red from breast up  . Flomax [Tamsulosin Hcl]     Blurred vision and lower back pain   . Hepatitis B Virus Vaccines     Nerve problems States his previous doctor told him it was due to this   . Iodine Hives    Dye from nuclear medicine test   Prior to Admission medications   Medication Sig Start Date End Date Taking? Authorizing Provider  aspirin EC 81 MG tablet Take 81 mg by mouth daily as needed for mild pain.    [provider]  atorvastatin (LIPITOR) 40 MG tablet Take 1 tablet (40 mg total) by mouth daily. 04/09/18 04/09/19  Sueanne Margarita, MD  cetirizine (ZYRTEC) 10 MG tablet Take 10 mg by mouth daily at 12 noon.    [provider]  clobetasol cream (TEMOVATE) 4.58 % Apply 1 application topically 2 (two) times daily as needed (rash/skin irritation.).  05/20/17   [provider]  clopidogrel (PLAVIX) 75 MG tablet TAKE 1 TABLET BY MOUTH EVERY DAY 07/18/18   [name], MD  colchicine 0.6 MG tablet TAKE 1 TABLET BY MOUTH TWICE A DAY 05/07/18   [name], MD  diphenhydrAMINE (BENADRYL) 25 MG tablet Take 25 mg by mouth every 6 (six) hours as needed.    [provider]  famotidine (PEPCID) 20 MG tablet Take 20 mg by mouth 2 (two) times daily. Noon & in the evening.     [provider]  lisinopril (ZESTRIL) 10 MG tablet Take 1 tablet (10 mg total) by mouth daily. 09/18/18   [name], MD  loperamide (IMODIUM) 2 MG capsule Take 4 mg by mouth as needed for diarrhea or loose stools.    [provider]  metoprolol tartrate (LOPRESSOR) 50 MG tablet TAKE 1.5 TABLETS BY MOUTH TWICE A DAY 09/18/18   [name], MD  Triamcinolone Acetonide (NASACORT AQ NA) Place 1 spray into the nose daily at 2 am. (0300 or 0400)    [provider]  triamcinolone cream (KENALOG) 0.1 % Apply 1 application topically 2 (two) times daily as needed. Patient taking differently: Apply 1 application topically 2 (two) times daily as needed (rash/skin irritation.).  08/29/17  [name], MD   Social History   Socioeconomic History  . Marital status: Married    Spouse name: Not on file  . Number of children: 1  . Years of education: Not on file  . Highest education level: Not on file  Occupational History  . Occupation: retired  Scientific laboratory technician  . Financial resource strain: Not hard at all  . Food insecurity    Worry: Never true    Inability: Never true  . Transportation needs    Medical: No    Non-medical: No  Tobacco Use  . Smoking status: Former Smoker    Packs/day: 3.00    Years: 50.00    Pack years: 150.00    Types: Cigarettes    Quit date: 04/05/2016    Years since quitting: 2.5  . Smokeless tobacco: Never Used  Substance and Sexual Activity  . Alcohol use: Yes    Alcohol/week: 0.0 standard drinks    Comment: rare  . Drug use: No  . Sexual activity: Not on file  Lifestyle  . Physical activity    Days per week: 0 days    Minutes per session: 0 min  . Stress: Not at all  Relationships  . Social connections    Talks on phone: More than three times a week    Gets together: More than three times a week    Attends religious service: Never    Active member of club or organization: No    Attends meetings of clubs or  organizations: Never    Relationship status: Married  . Intimate partner violence    Fear of current or ex partner: No    Emotionally abused: No    Physically abused: No    Forced sexual activity: No  Other Topics Concern  . Not on file  Social History Narrative   Married   Exercise: No   Education: GED    Review of Systems  Constitutional: Negative for fatigue and unexpected weight change.  Eyes: Negative for visual disturbance.  Respiratory: Negative for cough, chest tightness and shortness of breath.   Cardiovascular: Negative for chest pain, palpitations and leg swelling.  Gastrointestinal: Negative for abdominal pain and blood in stool.  Neurological: Negative for dizziness, light-headedness and headaches.       Objective:   Physical Exam Vitals signs reviewed.  Constitutional:      Appearance: He is well-developed.  HENT:     Head: Normocephalic and atraumatic.     Nose: Nose normal.     Comments: No bleeding/wounds seen.     Mouth/Throat:     Mouth: Mucous membranes are moist.     Pharynx: Oropharynx is clear.  Eyes:     Pupils: Pupils are equal, round, and reactive to light.  Neck:     Vascular: No carotid bruit or JVD.  Cardiovascular:     Rate and Rhythm: Normal rate and regular rhythm.     Heart sounds: Normal heart sounds. No murmur.  Pulmonary:     Effort: Pulmonary effort is normal.     Breath sounds: Normal breath sounds. No rales.  Skin:    General: Skin is warm and dry.  Neurological:     Mental Status: He is alert and oriented to person, place, and time.    Vitals:   10/16/18 1346  BP: 126/70  Pulse: 62  Resp: 14  Temp: 98.1 F (36.7 C)  TempSrc: Oral  SpO2: 97%  Weight: 206 lb (93.4 kg)  patient's machine 144/82.      Assessment & Plan:   Seth [name] is a 73 y.o. male Sinus bradycardia Asymptomatic, option of changing to lower dose of beta-blocker, re-starting higher dose of ACE inhibitor if symptomatic or lower readings.   Essential hypertension  -Stable, no changes for now.  Handout given on appropriate blood pressure monitoring at home.  Hemoptysis  -Plan for ENT eval if persistent.  Temporary discontinuation of steroid nasal spray may be helpful.  No orders of the defined types were placed in this encounter.  Patient Instructions    See info below on how to check your blood pressure the right away so you obtain accurate readings.   If heart rate is running lower or you are having lightheadedness or dizziness with standing, we can change metoprolol dose and restart previous dose of lisinopril.  Let me know.  If blood in sputum is not improving next few days, let me know and I will refer you to ear nose and throat.  Return to the clinic or go to the nearest emergency room if any of your symptoms worsen or new symptoms occur.   How to Take Your Blood Pressure You can take your blood pressure at home with a machine. You may need to check your blood pressure at home:  To check if you have high blood pressure (hypertension).  To check your blood pressure over time.  To make sure your blood pressure medicine is working. Supplies needed: You will need a blood pressure machine, or monitor. You can buy one at a drugstore or online. When choosing one:  Choose one with an arm cuff.  Choose one that wraps around your upper arm. Only one finger should fit between your arm and the cuff.  Do not choose one that measures your blood pressure from your wrist or finger. Your doctor can suggest a monitor. How to prepare Avoid these things for 30 minutes before checking your blood pressure:  Drinking caffeine.  Drinking alcohol.  Eating.  Smoking.  Exercising. Five minutes before checking your blood pressure:  Pee.  Sit in a dining chair. Avoid sitting in a soft couch or armchair.  Be quiet. Do not talk. How to take your blood pressure Follow the instructions that came with your machine. If you  have a digital blood pressure monitor, these may be the instructions: 1. Sit up straight. 2. Place your feet on the floor. Do not cross your ankles or legs. 3. Rest your left arm at the level of your heart. You may rest it on a table, desk, or chair. 4. Pull up your shirt sleeve. 5. Wrap the blood pressure cuff around the upper part of your left arm. The cuff should be 1 inch (2.5 cm) above your elbow. It is best to wrap the cuff around bare skin. 6. Fit the cuff snugly around your arm. You should be able to place only one finger between the cuff and your arm. 7. Put the cord inside the groove of your elbow. 8. Press the power button. 9. Sit quietly while the cuff fills with air and loses air. 10. Write down the numbers on the screen. 11. Wait 2-3 minutes and then repeat steps 1-10. What do the numbers mean? Two numbers make up your blood pressure. The first number is called systolic pressure. The second is called diastolic pressure. An example of a blood pressure reading is "120 over 80" (or 120/80). If you are an adult and do not  have a medical condition, use this guide to find out if your blood pressure is normal: Normal  First number: below 120.  Second number: below 80. Elevated  First number: 120-129.  Second number: below 80. Hypertension stage 1  First number: 130-139.  Second number: 80-89. Hypertension stage 2  First number: 140 or above.  Second number: 70 or above. Your blood pressure is above normal even if only the top or bottom number is above normal. Follow these instructions at home:  Check your blood pressure as often as your doctor tells you to.  Take your monitor to your next doctor's appointment. Your doctor will: ? Make sure you are using it correctly. ? Make sure it is working right.  Make sure you understand what your blood pressure numbers should be.  Tell your doctor if your medicines are causing side effects. Contact a doctor if:  Your blood  pressure keeps being high. Get help right away if:  Your first blood pressure number is higher than 180.  Your second blood pressure number is higher than 120. This information is not intended to replace advice given to you by your health care provider. Make sure you discuss any questions you have with your health care provider. Document Released: 01/05/2008 Document Revised: 01/04/2017 Document Reviewed: 07/01/2015 Elsevier Patient Education  [employer].      If you have lab work done today you will be contacted with your lab results within the next 2 weeks.  If you have not heard from [place] then please contact us. The fastest way to get your results is to register for My Chart.   IF you received an x-ray today, you will receive an invoice from [facility]. Please contact [facility] at [phone] with questions or concerns regarding your invoice.   IF you received labwork today, you will receive an invoice from [place]. Please contact [facility] at [phone] with questions or concerns regarding your invoice.   Our billing staff will not be able to assist you with questions regarding bills from these companies.  You will be contacted with the lab results as soon as they are available. The fastest way to get your results is to activate your My Chart account. Instructions are located on the last page of this paperwork. If you have not heard from [place] regarding the results in 2 weeks, please contact this office.       Signed,   [name], MD [facility].  10/18/18 12:38 PM

## 2018-10-16 NOTE — Patient Instructions (Addendum)
See info below on how to check your blood pressure the right away so you obtain accurate readings.   If heart rate is running lower or you are having lightheadedness or dizziness with standing, we can change metoprolol dose and restart previous dose of lisinopril.  Let me know.  If blood in sputum is not improving next few days, let me know and I will refer you to ear nose and throat.  Return to the clinic or go to the nearest emergency room if any of your symptoms worsen or new symptoms occur.   How to Take Your Blood Pressure You can take your blood pressure at home with a machine. You may need to check your blood pressure at home:  To check if you have high blood pressure (hypertension).  To check your blood pressure over time.  To make sure your blood pressure medicine is working. Supplies needed: You will need a blood pressure machine, or monitor. You can buy one at a drugstore or online. When choosing one:  Choose one with an arm cuff.  Choose one that wraps around your upper arm. Only one finger should fit between your arm and the cuff.  Do not choose one that measures your blood pressure from your wrist or finger. Your doctor can suggest a monitor. How to prepare Avoid these things for 30 minutes before checking your blood pressure:  Drinking caffeine.  Drinking alcohol.  Eating.  Smoking.  Exercising. Five minutes before checking your blood pressure:  Pee.  Sit in a dining chair. Avoid sitting in a soft couch or armchair.  Be quiet. Do not talk. How to take your blood pressure Follow the instructions that came with your machine. If you have a digital blood pressure monitor, these may be the instructions: 1. Sit up straight. 2. Place your feet on the floor. Do not cross your ankles or legs. 3. Rest your left arm at the level of your heart. You may rest it on a table, desk, or chair. 4. Pull up your shirt sleeve. 5. Wrap the blood pressure cuff around the  upper part of your left arm. The cuff should be 1 inch (2.5 cm) above your elbow. It is best to wrap the cuff around bare skin. 6. Fit the cuff snugly around your arm. You should be able to place only one finger between the cuff and your arm. 7. Put the cord inside the groove of your elbow. 8. Press the power button. 9. Sit quietly while the cuff fills with air and loses air. 10. Write down the numbers on the screen. 11. Wait 2-3 minutes and then repeat steps 1-10. What do the numbers mean? Two numbers make up your blood pressure. The first number is called systolic pressure. The second is called diastolic pressure. An example of a blood pressure reading is "120 over 80" (or 120/80). If you are an adult and do not have a medical condition, use this guide to find out if your blood pressure is normal: Normal  First number: below 120.  Second number: below 80. Elevated  First number: 120-129.  Second number: below 80. Hypertension stage 1  First number: 130-139.  Second number: 80-89. Hypertension stage 2  First number: 140 or above.  Second number: 90 or above. Your blood pressure is above normal even if only the top or bottom number is above normal. Follow these instructions at home:  Check your blood pressure as often as your doctor tells you to.  Take  your monitor to your next doctor's appointment. Your doctor will: ? Make sure you are using it correctly. ? Make sure it is working right.  Make sure you understand what your blood pressure numbers should be.  Tell your doctor if your medicines are causing side effects. Contact a doctor if:  Your blood pressure keeps being high. Get help right away if:  Your first blood pressure number is higher than 180.  Your second blood pressure number is higher than 120. This information is not intended to replace advice given to you by your health care provider. Make sure you discuss any questions you have with your health care  provider. Document Released: 01/05/2008 Document Revised: 01/04/2017 Document Reviewed: 07/01/2015 Elsevier Patient Education  [employer].      If you have lab work done today you will be contacted with your lab results within the next 2 weeks.  If you have not heard from [place] then please contact us. The fastest way to get your results is to register for My Chart.   IF you received an x-ray today, you will receive an invoice from [facility]. Please contact [facility] at [phone] with questions or concerns regarding your invoice.   IF you received labwork today, you will receive an invoice from [place]. Please contact [facility] at [phone] with questions or concerns regarding your invoice.   Our billing staff will not be able to assist you with questions regarding bills from these companies.  You will be contacted with the lab results as soon as they are available. The fastest way to get your results is to activate your My Chart account. Instructions are located on the last page of this paperwork. If you have not heard from [place] regarding the results in 2 weeks, please contact this office.

## 2018-10-16 NOTE — Telephone Encounter (Signed)
Scheduled appt per 9/10 los - mailed letter with appt date and time

## 2018-10-18 ENCOUNTER — Encounter: Payer: Self-pay | Admitting: Family Medicine

## 2018-11-17 DIAGNOSIS — S82832A Other fracture of upper and lower end of left fibula, initial encounter for closed fracture: Secondary | ICD-10-CM | POA: Diagnosis not present

## 2018-11-19 ENCOUNTER — Encounter: Payer: Self-pay | Admitting: *Deleted

## 2018-11-19 NOTE — Telephone Encounter (Signed)
This encounter was created in error - please disregard.

## 2018-11-24 DIAGNOSIS — S82832D Other fracture of upper and lower end of left fibula, subsequent encounter for closed fracture with routine healing: Secondary | ICD-10-CM | POA: Diagnosis not present

## 2018-12-04 DIAGNOSIS — S93491A Sprain of other ligament of right ankle, initial encounter: Secondary | ICD-10-CM | POA: Diagnosis not present

## 2018-12-04 DIAGNOSIS — S82832D Other fracture of upper and lower end of left fibula, subsequent encounter for closed fracture with routine healing: Secondary | ICD-10-CM | POA: Diagnosis not present

## 2018-12-15 ENCOUNTER — Other Ambulatory Visit: Payer: Self-pay | Admitting: Family Medicine

## 2018-12-15 DIAGNOSIS — I10 Essential (primary) hypertension: Secondary | ICD-10-CM

## 2018-12-15 DIAGNOSIS — E785 Hyperlipidemia, unspecified: Secondary | ICD-10-CM

## 2018-12-18 DIAGNOSIS — S82832D Other fracture of upper and lower end of left fibula, subsequent encounter for closed fracture with routine healing: Secondary | ICD-10-CM | POA: Diagnosis not present

## 2018-12-20 ENCOUNTER — Other Ambulatory Visit: Payer: Self-pay | Admitting: Family Medicine

## 2018-12-20 DIAGNOSIS — I739 Peripheral vascular disease, unspecified: Secondary | ICD-10-CM

## 2019-01-02 ENCOUNTER — Telehealth: Payer: Self-pay | Admitting: Medical Oncology

## 2019-01-02 NOTE — Telephone Encounter (Signed)
Returned pt call -LVM to return my call.

## 2019-01-05 ENCOUNTER — Telehealth: Payer: Self-pay | Admitting: Medical Oncology

## 2019-01-05 DIAGNOSIS — S82832D Other fracture of upper and lower end of left fibula, subsequent encounter for closed fracture with routine healing: Secondary | ICD-10-CM | POA: Diagnosis not present

## 2019-01-05 NOTE — Telephone Encounter (Signed)
LVM re next week lab and CT appts and to pick up  Contrast.

## 2019-01-05 NOTE — Telephone Encounter (Signed)
Pt notified to pick up contrast today.

## 2019-01-13 ENCOUNTER — Ambulatory Visit (HOSPITAL_COMMUNITY)
Admission: RE | Admit: 2019-01-13 | Discharge: 2019-01-13 | Disposition: A | Discharge status: Home / Self Care | Payer: Medicare Other | Source: Ambulatory Visit | Attending: Internal Medicine | Admitting: Internal Medicine

## 2019-01-13 ENCOUNTER — Other Ambulatory Visit: Payer: Self-pay

## 2019-01-13 ENCOUNTER — Inpatient Hospital Stay: Payer: Medicare Other | Attending: Internal Medicine

## 2019-01-13 DIAGNOSIS — C3411 Malignant neoplasm of upper lobe, right bronchus or lung: Secondary | ICD-10-CM | POA: Diagnosis not present

## 2019-01-13 DIAGNOSIS — J439 Emphysema, unspecified: Secondary | ICD-10-CM | POA: Insufficient documentation

## 2019-01-13 DIAGNOSIS — I719 Aortic aneurysm of unspecified site, without rupture: Secondary | ICD-10-CM | POA: Diagnosis not present

## 2019-01-13 DIAGNOSIS — Z888 Allergy status to other drugs, medicaments and biological substances status: Secondary | ICD-10-CM | POA: Diagnosis not present

## 2019-01-13 DIAGNOSIS — I700 Atherosclerosis of aorta: Secondary | ICD-10-CM | POA: Diagnosis not present

## 2019-01-13 DIAGNOSIS — C7971 Secondary malignant neoplasm of right adrenal gland: Secondary | ICD-10-CM | POA: Diagnosis not present

## 2019-01-13 DIAGNOSIS — R5383 Other fatigue: Secondary | ICD-10-CM | POA: Insufficient documentation

## 2019-01-13 DIAGNOSIS — Z79899 Other long term (current) drug therapy: Secondary | ICD-10-CM | POA: Diagnosis not present

## 2019-01-13 DIAGNOSIS — C7951 Secondary malignant neoplasm of bone: Secondary | ICD-10-CM | POA: Diagnosis not present

## 2019-01-13 DIAGNOSIS — I2510 Atherosclerotic heart disease of native coronary artery without angina pectoris: Secondary | ICD-10-CM | POA: Insufficient documentation

## 2019-01-13 DIAGNOSIS — C7972 Secondary malignant neoplasm of left adrenal gland: Secondary | ICD-10-CM | POA: Insufficient documentation

## 2019-01-13 DIAGNOSIS — R58 Hemorrhage, not elsewhere classified: Secondary | ICD-10-CM | POA: Insufficient documentation

## 2019-01-13 DIAGNOSIS — I10 Essential (primary) hypertension: Secondary | ICD-10-CM | POA: Insufficient documentation

## 2019-01-13 DIAGNOSIS — C3490 Malignant neoplasm of unspecified part of unspecified bronchus or lung: Secondary | ICD-10-CM | POA: Diagnosis not present

## 2019-01-13 DIAGNOSIS — R197 Diarrhea, unspecified: Secondary | ICD-10-CM | POA: Diagnosis not present

## 2019-01-13 LAB — CBC WITH DIFFERENTIAL (CANCER CENTER ONLY)
Abs Immature Granulocytes: 0.04 10*3/uL (ref 0.00–0.07)
Basophils Absolute: 0 10*3/uL (ref 0.0–0.1)
Basophils Relative: 0 %
Eosinophils Absolute: 0.2 10*3/uL (ref 0.0–0.5)
Eosinophils Relative: 3 %
HCT: 40.5 % (ref 39.0–52.0)
Hemoglobin: 13.3 g/dL (ref 13.0–17.0)
Immature Granulocytes: 1 %
Lymphocytes Relative: 13 %
Lymphs Abs: 0.8 10*3/uL (ref 0.7–4.0)
MCH: 29.9 pg (ref 26.0–34.0)
MCHC: 32.8 g/dL (ref 30.0–36.0)
MCV: 91 fL (ref 80.0–100.0)
Monocytes Absolute: 0.6 10*3/uL (ref 0.1–1.0)
Monocytes Relative: 9 %
Neutro Abs: 4.9 10*3/uL (ref 1.7–7.7)
Neutrophils Relative %: 74 %
Platelet Count: 192 10*3/uL (ref 150–400)
RBC: 4.45 MIL/uL (ref 4.22–5.81)
RDW: 15.2 % (ref 11.5–15.5)
WBC Count: 6.5 10*3/uL (ref 4.0–10.5)
nRBC: 0 % (ref 0.0–0.2)

## 2019-01-13 LAB — CMP (CANCER CENTER ONLY)
ALT: 7 U/L (ref 0–44)
AST: 10 U/L — ABNORMAL LOW (ref 15–41)
Albumin: 3.8 g/dL (ref 3.5–5.0)
Alkaline Phosphatase: 129 U/L — ABNORMAL HIGH (ref 38–126)
Anion gap: 11 (ref 5–15)
BUN: 6 mg/dL — ABNORMAL LOW (ref 8–23)
CO2: 25 mmol/L (ref 22–32)
Calcium: 9 mg/dL (ref 8.9–10.3)
Chloride: 101 mmol/L (ref 98–111)
Creatinine: 1.09 mg/dL (ref 0.61–1.24)
GFR, Est AFR Am: >60 mL/min (ref >60)
GFR, Estimated: >60 mL/min (ref >60)
Glucose, Bld: 111 mg/dL — ABNORMAL HIGH (ref 70–99)
Potassium: 4 mmol/L (ref 3.5–5.1)
Sodium: 137 mmol/L (ref 135–145)
Total Bilirubin: 0.6 mg/dL (ref 0.3–1.2)
Total Protein: 6.8 g/dL (ref 6.5–8.1)

## 2019-01-14 ENCOUNTER — Encounter: Payer: Self-pay | Admitting: *Deleted

## 2019-01-15 ENCOUNTER — Telehealth: Payer: Self-pay | Admitting: Internal Medicine

## 2019-01-15 ENCOUNTER — Encounter: Payer: Self-pay | Admitting: Family Medicine

## 2019-01-15 ENCOUNTER — Ambulatory Visit (INDEPENDENT_AMBULATORY_CARE_PROVIDER_SITE_OTHER): Payer: Medicare Other | Admitting: Family Medicine

## 2019-01-15 ENCOUNTER — Encounter: Payer: Self-pay | Admitting: Internal Medicine

## 2019-01-15 ENCOUNTER — Other Ambulatory Visit: Payer: Self-pay

## 2019-01-15 ENCOUNTER — Ambulatory Visit: Payer: Medicare Other | Admitting: Family Medicine

## 2019-01-15 ENCOUNTER — Inpatient Hospital Stay (HOSPITAL_BASED_OUTPATIENT_CLINIC_OR_DEPARTMENT_OTHER): Payer: Medicare Other | Admitting: Internal Medicine

## 2019-01-15 ENCOUNTER — Encounter (HOSPITAL_COMMUNITY): Payer: Self-pay | Admitting: Radiology

## 2019-01-15 VITALS — BP 125/76 | HR 87 | Temp 98.2°F | Wt 210.4 lb

## 2019-01-15 VITALS — BP 145/83 | HR 84 | Temp 98.3°F | Resp 18 | Ht 73.0 in | Wt 209.7 lb

## 2019-01-15 DIAGNOSIS — S91302A Unspecified open wound, left foot, initial encounter: Secondary | ICD-10-CM | POA: Diagnosis not present

## 2019-01-15 DIAGNOSIS — F17200 Nicotine dependence, unspecified, uncomplicated: Secondary | ICD-10-CM

## 2019-01-15 DIAGNOSIS — I2510 Atherosclerotic heart disease of native coronary artery without angina pectoris: Secondary | ICD-10-CM

## 2019-01-15 DIAGNOSIS — C3490 Malignant neoplasm of unspecified part of unspecified bronchus or lung: Secondary | ICD-10-CM

## 2019-01-15 DIAGNOSIS — I10 Essential (primary) hypertension: Secondary | ICD-10-CM | POA: Diagnosis not present

## 2019-01-15 DIAGNOSIS — I739 Peripheral vascular disease, unspecified: Secondary | ICD-10-CM | POA: Diagnosis not present

## 2019-01-15 DIAGNOSIS — C3411 Malignant neoplasm of upper lobe, right bronchus or lung: Secondary | ICD-10-CM | POA: Diagnosis not present

## 2019-01-15 DIAGNOSIS — R600 Localized edema: Secondary | ICD-10-CM

## 2019-01-15 DIAGNOSIS — C3492 Malignant neoplasm of unspecified part of left bronchus or lung: Secondary | ICD-10-CM | POA: Diagnosis not present

## 2019-01-15 MED ORDER — DOXYCYCLINE HYCLATE 100 MG PO TABS: 100.0000 mg | ORAL_TABLET | Freq: Two times a day (BID) | ORAL | 0 refills | Status: DC

## 2019-01-15 MED ORDER — POTASSIUM CHLORIDE CRYS ER 10 MEQ PO TBCR: 10.0000 meq | EXTENDED_RELEASE_TABLET | Freq: Every day | ORAL | 0 refills | Status: DC

## 2019-01-15 MED ORDER — FUROSEMIDE 20 MG PO TABS: 20.0000 mg | ORAL_TABLET | Freq: Every day | ORAL | 0 refills | Status: DC

## 2019-01-15 NOTE — Progress Notes (Signed)
[facility] [phone]   [phone]  OFFICE PROGRESS NOTE  [name], MD [place] [address]  DIAGNOSIS: Stage IIB (T1c, N1, M0) non-small cell lung cancer favoring adenocarcinoma presented with right upper lobe lung nodule in addition to right hilar lymphadenopathy diagnosed in February 2020.  The patient is not a good surgical candidate for resection.  PRIOR THERAPY: Concurrent chemoradiation with weekly carboplatin for AUC of 2 and paclitaxel 45 mg/M2.  First dose May 05, 2018.  Status post 7 cycles.  Last dose was given Jun 16, 2018  CURRENT THERAPY: Observation.  INTERVAL HISTORY: Seth [name] 73 y.o. male returns to the clinic today for follow-up visit.  The patient has been on observation the last several months.  He is feeling fine today with no concerning complaints except for diarrhea after the oral contrast.  He denied having any current chest pain, shortness of breath, cough or hemoptysis.  He denied having any fever or chills.  He has no nausea, vomiting or constipation.  He denied having recent weight loss or night sweats.  He has no headache or visual changes.  The patient had repeat CT scan of the chest, abdomen pelvis performed recently and he is here for evaluation and discussion of his scan results.  MEDICAL HISTORY: Past Medical History:  Diagnosis Date   AAA (abdominal aortic aneurysm) (HCC)    Allergic rhinitis    Anxiety    Back pain    CAD (coronary artery disease)    Cataracts, bilateral    Emphysema, unspecified ([place])     " moderate"   Essential hypertension, benign    GERD (gastroesophageal reflux disease)    Gout    Headache    migraines   Heart murmur    as a child only   Hilar adenopathy    History of hiatal hernia    History of sciatica    Hyperlipidemia    lung ca dx'd 02/2018   Lung nodule    Pneumonia    as child   Prostate hypertrophy    PVD  (peripheral vascular disease) ([place])    Seasonal allergies    Subclavian steal syndrome of left subclavian artery    Wears dentures    Wears dentures     ALLERGIES:  is allergic to biaxin [clarithromycin]; clarithromycin; iodinated diagnostic agents; prednisone; cortisone; flomax [tamsulosin hcl]; hepatitis b virus vaccines; and iodine.  MEDICATIONS:  Current Outpatient Medications  Medication Sig Dispense Refill   aspirin EC 81 MG tablet Take 81 mg by mouth daily as needed for mild pain.     atorvastatin (LIPITOR) 40 MG tablet Take 1 tablet (40 mg total) by mouth daily. 90 tablet 3   cetirizine (ZYRTEC) 10 MG tablet Take 10 mg by mouth daily at 12 noon.     clobetasol cream (TEMOVATE) 2.50 % Apply 1 application topically 2 (two) times daily as needed (rash/skin irritation.).      clopidogrel (PLAVIX) 75 MG tablet TAKE 1 TABLET BY MOUTH EVERY DAY 90 tablet 0   colchicine 0.6 MG tablet TAKE 1 TABLET BY MOUTH TWICE A DAY 180 tablet 1   diphenhydrAMINE (BENADRYL) 25 MG tablet Take 25 mg by mouth every 6 (six) hours as needed.     famotidine (PEPCID) 20 MG tablet Take 20 mg by mouth 2 (two) times daily. Noon & in the evening.     lisinopril (ZESTRIL) 10 MG tablet Take 1 tablet (10  mg total) by mouth daily. 90 tablet 1   loperamide (IMODIUM) 2 MG capsule Take 4 mg by mouth as needed for diarrhea or loose stools.     metoprolol tartrate (LOPRESSOR) 50 MG tablet TAKE 1 AND 1/2 TABLETS BY MOUTH TWICE A DAY 270 tablet 0   Triamcinolone Acetonide (NASACORT AQ NA) Place 1 spray into the nose daily at 2 am. (0300 or 0400)     triamcinolone cream (KENALOG) 0.1 % Apply 1 application topically 2 (two) times daily as needed. (Patient taking differently: Apply 1 application topically 2 (two) times daily as needed (rash/skin irritation.). ) 30 g 0   Current Facility-Administered Medications  Medication Dose Route Frequency Provider Last Rate Last Admin   0.9 %  sodium chloride infusion   500 mL Intravenous Once Irene Shipper, MD        SURGICAL HISTORY:  Past Surgical History:  Procedure Laterality Date   CARDIAC CATHETERIZATION     stent placement   COLONOSCOPY W/ BIOPSIES AND POLYPECTOMY     ILIAC ARTERY STENT     MULTIPLE TOOTH EXTRACTIONS     VIDEO BRONCHOSCOPY WITH ENDOBRONCHIAL NAVIGATION N/A 02/26/2018   Procedure: VIDEO BRONCHOSCOPY WITH ENDOBRONCHIAL NAVIGATION;  Surgeon: Garner Nash, DO;  Location: [facility];  Service: Thoracic;  Laterality: N/A;   VIDEO BRONCHOSCOPY WITH ENDOBRONCHIAL NAVIGATION N/A 03/19/2018   Procedure: VIDEO BRONCHOSCOPY WITH ENDOBRONCHIAL NAVIGATION and endobronchial ultrasound;  Surgeon: Garner Nash, DO;  Location: [facility];  Service: Thoracic;  Laterality: N/A;   VIDEO BRONCHOSCOPY WITH ENDOBRONCHIAL ULTRASOUND N/A 02/26/2018   Procedure: VIDEO BRONCHOSCOPY WITH ENDOBRONCHIAL ULTRASOUND;  Surgeon: Garner Nash, DO;  Location: [facility];  Service: Thoracic;  Laterality: N/A;   VIDEO BRONCHOSCOPY WITH ENDOBRONCHIAL ULTRASOUND N/A 03/19/2018   Procedure: NAVIGATION BRONCHOSCOPY;  Surgeon: Garner Nash, DO;  Location: [facility];  Service: Thoracic;  Laterality: N/A;    REVIEW OF SYSTEMS:  Constitutional: negative Eyes: negative Ears, nose, mouth, throat, and face: negative Respiratory: negative Cardiovascular: negative Gastrointestinal: positive for diarrhea Genitourinary:negative Integument/breast: negative Hematologic/lymphatic: negative Musculoskeletal:negative Neurological: negative Behavioral/Psych: negative Endocrine: negative Allergic/Immunologic: negative   PHYSICAL EXAMINATION: General appearance: alert, cooperative and no distress Head: Normocephalic, without obvious abnormality, atraumatic Neck: no adenopathy, no JVD, supple, symmetrical, trachea midline and thyroid not enlarged, symmetric, no tenderness/mass/nodules Lymph nodes: Cervical, supraclavicular, and axillary nodes normal. Resp: clear to auscultation  bilaterally Back: symmetric, no curvature. ROM normal. No CVA tenderness. Cardio: regular rate and rhythm, S1, S2 normal, no murmur, click, rub or gallop GI: soft, non-tender; bowel sounds normal; no masses,  no organomegaly Extremities: extremities normal, atraumatic, no cyanosis or edema Neurologic: Alert and oriented X 3, normal strength and tone. Normal symmetric reflexes. Normal coordination and gait  ECOG PERFORMANCE STATUS: 1 - Symptomatic but completely ambulatory  Blood pressure (!) 145/83, pulse 84, temperature 98.3 F (36.8 C), temperature source Temporal, resp. rate 18, height 6\' 1"  (1.854 m), weight 209 lb 11.2 oz (95.1 kg), SpO2 100 %.  LABORATORY DATA: Lab Results  Component Value Date   WBC 6.5 01/13/2019   HGB 13.3 01/13/2019   HCT 40.5 01/13/2019   MCV 91.0 01/13/2019   PLT 192 01/13/2019      Chemistry      Component Value Date/Time   NA 137 01/13/2019 1012   NA 140 04/28/2018 1647   K 4.0 01/13/2019 1012   CL 101 01/13/2019 1012   CO2 25 01/13/2019 1012   BUN 6 (L) 01/13/2019 1012   BUN  9 04/28/2018 1647   CREATININE 1.09 01/13/2019 1012   CREATININE 1.09 03/23/2015 0928      Component Value Date/Time   CALCIUM 9.0 01/13/2019 1012   ALKPHOS 129 (H) 01/13/2019 1012   AST 10 (L) 01/13/2019 1012   ALT 7 01/13/2019 1012   BILITOT 0.6 01/13/2019 1012       RADIOGRAPHIC STUDIES: CT Abdomen Pelvis Wo Contrast  Result Date: 01/13/2019 CLINICAL DATA:  Non-small cell lung cancer, restaging EXAM: CT CHEST, ABDOMEN AND PELVIS WITHOUT CONTRAST TECHNIQUE: Multidetector CT imaging of the chest, abdomen and pelvis was performed following the standard protocol without IV contrast. COMPARISON:  CT chest dated 10/14/2018.  PET-CT dated 03/03/2018. FINDINGS: CT CHEST FINDINGS Cardiovascular: The heart is normal in size. Trace pericardial effusion. No evidence of thoracic aortic aneurysm. Atherosclerotic calcifications of the aortic arch. Three vessel coronary  atherosclerosis. Mediastinum/Nodes: No suspicious mediastinal lymphadenopathy. Visualized thyroid is unremarkable. Lungs/Pleura: Moderate centrilobular and paraseptal emphysematous changes, upper lung predominant. Radiation changes in the left upper lobe/lingula and superior segment left lower lobe. Near complete resolution of prior underlying central lingular nodule, measuring approximately 7 mm (series 6/image 80). 3 mm subpleural nodule in the anterior right lower lobe (series 6/image 114), unchanged. No new/suspicious pulmonary nodules. No focal consolidation. No pleural effusion or pneumothorax. Musculoskeletal: Visualized osseous structures are within normal limits. CT ABDOMEN PELVIS FINDINGS Hepatobiliary: Unenhanced liver is unremarkable. Gallbladder is unremarkable. No intrahepatic or extrahepatic ductal dilatation. Pancreas: Within normal limits. Spleen: Within normal limits. Adrenals/Urinary Tract: 19 mm left adrenal nodule, previously 15 mm. Two right adrenal nodules measuring 23 and 18 mm, previously 16 and 11 mm when measured in a similar fashion. These are new from prior PET and are compatible with metastases. Kidneys are within normal limits, noting bilateral renal vascular calcifications. No hydronephrosis. Bladder is within normal limits. Stomach/Bowel: Stomach is within normal limits. No evidence of bowel obstruction. Normal appendix (series 2/image 95). Vascular/Lymphatic: 3.9 x 4.5 cm infrarenal abdominal aortic aneurysm, previously 4.0 x 4.6 cm on prior PET, unchanged. Atherosclerotic calcifications of the abdominal aorta and branch vessels. Bilateral common iliac artery stents. No suspicious abdominopelvic lymphadenopathy. Reproductive: Prostatomegaly. Other: No abdominopelvic ascites. Musculoskeletal: Grade 2 spondylolisthesis at L5-S1. Mild degenerative changes of the lower lumbar spine. IMPRESSION: Radiation changes in the left hemithorax, as above. Near complete resolution of prior  lingular nodule, now measuring 7 mm. Progressive bilateral adrenal metastases, measuring up to 2.3 cm on the right. 3.5 x 4.5 cm infrarenal abdominal aortic aneurysm, grossly unchanged. In this patient, attention on follow-up is likely satisfactory. J Am Coll Radiol 2013; 10:789-794. Aortic aneurysm NOS (ICD10-I71.9) Additional stable ancillary findings as above. Aortic Atherosclerosis (ICD10-I70.0) and Emphysema (ICD10-J43.9). Electronically Signed   By: Julian Hy M.D.   On: 01/13/2019 14:02   CT Chest Wo Contrast  Result Date: 01/13/2019 CLINICAL DATA:  Non-small cell lung cancer, restaging EXAM: CT CHEST, ABDOMEN AND PELVIS WITHOUT CONTRAST TECHNIQUE: Multidetector CT imaging of the chest, abdomen and pelvis was performed following the standard protocol without IV contrast. COMPARISON:  CT chest dated 10/14/2018.  PET-CT dated 03/03/2018. FINDINGS: CT CHEST FINDINGS Cardiovascular: The heart is normal in size. Trace pericardial effusion. No evidence of thoracic aortic aneurysm. Atherosclerotic calcifications of the aortic arch. Three vessel coronary atherosclerosis. Mediastinum/Nodes: No suspicious mediastinal lymphadenopathy. Visualized thyroid is unremarkable. Lungs/Pleura: Moderate centrilobular and paraseptal emphysematous changes, upper lung predominant. Radiation changes in the left upper lobe/lingula and superior segment left lower lobe. Near complete resolution of prior underlying central lingular nodule,  measuring approximately 7 mm (series 6/image 80). 3 mm subpleural nodule in the anterior right lower lobe (series 6/image 114), unchanged. No new/suspicious pulmonary nodules. No focal consolidation. No pleural effusion or pneumothorax. Musculoskeletal: Visualized osseous structures are within normal limits. CT ABDOMEN PELVIS FINDINGS Hepatobiliary: Unenhanced liver is unremarkable. Gallbladder is unremarkable. No intrahepatic or extrahepatic ductal dilatation. Pancreas: Within normal limits.  Spleen: Within normal limits. Adrenals/Urinary Tract: 19 mm left adrenal nodule, previously 15 mm. Two right adrenal nodules measuring 23 and 18 mm, previously 16 and 11 mm when measured in a similar fashion. These are new from prior PET and are compatible with metastases. Kidneys are within normal limits, noting bilateral renal vascular calcifications. No hydronephrosis. Bladder is within normal limits. Stomach/Bowel: Stomach is within normal limits. No evidence of bowel obstruction. Normal appendix (series 2/image 95). Vascular/Lymphatic: 3.9 x 4.5 cm infrarenal abdominal aortic aneurysm, previously 4.0 x 4.6 cm on prior PET, unchanged. Atherosclerotic calcifications of the abdominal aorta and branch vessels. Bilateral common iliac artery stents. No suspicious abdominopelvic lymphadenopathy. Reproductive: Prostatomegaly. Other: No abdominopelvic ascites. Musculoskeletal: Grade 2 spondylolisthesis at L5-S1. Mild degenerative changes of the lower lumbar spine. IMPRESSION: Radiation changes in the left hemithorax, as above. Near complete resolution of prior lingular nodule, now measuring 7 mm. Progressive bilateral adrenal metastases, measuring up to 2.3 cm on the right. 3.5 x 4.5 cm infrarenal abdominal aortic aneurysm, grossly unchanged. In this patient, attention on follow-up is likely satisfactory. J Am Coll Radiol 2013; 10:789-794. Aortic aneurysm NOS (ICD10-I71.9) Additional stable ancillary findings as above. Aortic Atherosclerosis (ICD10-I70.0) and Emphysema (ICD10-J43.9). Electronically Signed   By: Julian Hy M.D.   On: 01/13/2019 14:02    ASSESSMENT AND PLAN: This is a very pleasant 73 years old white male recently diagnosed with unresectable stage IIb non-small cell lung cancer, adenocarcinoma presented with right upper lobe lung nodule in addition to right hilar lymphadenopathy diagnosed in February 2020.  The patient is now a good candidate for surgical resection. The patient underwent a  course of concurrent chemoradiation with weekly carboplatin and paclitaxel status post 7 cycles completed in May 2020. The patient is currently on observation and he is feeling fine. He had repeat CT scan of the chest, abdomen pelvis performed recently.  I personally and independently reviewed the scan images and discussed the results with the patient today. Unfortunately his a scan showed further progression of the bilateral adrenal lesions suspicious for metastasis. I recommended for the patient to have a PET scan for further evaluation of this lesion. I will also arrange for the patient to have CT-guided core biopsy by interventional radiology of the most accessible either left or right adrenal gland lesion for confirmation of tissue diagnosis. I will see him back for follow-up visit in 2-3 weeks for reevaluation and discussion of his treatment options based on the final staging work-up and biopsy results. For hypertension, the patient was advised to take his blood pressure medication as prescribed and to monitor it closely at home. He was advised to call immediately if he has any concerning symptoms in the interval. The patient voices understanding of current disease status and treatment options and is in agreement with the current care plan. All questions were answered. The patient knows to call the clinic with any problems, questions or concerns. We can certainly see the patient much sooner if necessary.  Disclaimer: This note was dictated with voice recognition software. Similar sounding words can inadvertently be transcribed and may not be corrected upon review.

## 2019-01-15 NOTE — Progress Notes (Signed)
Seth [name] Male, 73 y.o., [birth year] MRN:  [id number] Phone:  [phone] [name]) PCP:  [name], MD Primary Cvg:  Medicare/Medicare Part A And B Next Appt With Radiology ([facility] PET) 01/22/2019 at 8:00 AM  RE: CT Biopsy Received: Today Message Contents  Arne Cleveland, MD  [name]  Ok   CT core L adrenal met  L lat decub   DDH       Previous Messages   ----- Message -----  From: [name] D  Sent: 01/15/2019 10:09 AM EST  To: [facility] Procedure Requests  Subject: CT Biopsy                     Procedure:  CT Biopsy   Reason:  Malignant neoplasm of unspecified part of unspecified bronchus or lung, CT-guided core biopsy of the left or right adrenal gland suspicious for metastasis.   History: NM PET, CT, MR in computer, patient also has order in system to have another NM PET scan    Ordering Provider:   Curt Bears   Ordering Provider Contact:  [phone]

## 2019-01-15 NOTE — Progress Notes (Signed)
Subjective:  Patient ID: Seth [name], male    DOB: 1945-12-18  Age: 73 y.o. MRN: [id number]  CC:  Chief Complaint  Patient presents with  . Medical Management of Chronic Issues    3 month f/u on BP    HPI Seth [name] presents for   Hypertension: Last discussed September 12.  Stable at that time.  Was bradycardic but asymptomatic.  Option of changing to a low-dose of beta-blocker, and higher dose of ACE inhibitor if needed.  Decided to remain on same medications, handout on  appropriate home monitoring. Home readings: few readings - 117-150/62-98 BP Readings from Last 3 Encounters:  01/15/19 125/76  01/15/19 (!) 145/83  10/16/18 126/70   Lab Results  Component Value Date   CREATININE 1.09 01/13/2019   Hemoptysis: Discussed September 20 visit.  Complete discontinuation of steroid nasal spray at that time with possible ENT eval if persistent.  No further hemoptysis. On nasacort at times.   Followed by Dr. Earlie Server for unresectable stage IIb non-small cell lung cancer, adenocarcinoma. appointment today reviewed.  Status post 7 cycles of chemoradiation with weekly carboplatin and paclitaxel completed in May.  Currently on observation.  CT scan of the chest, abdomen, pelvis with further progression of bilateral adrenal lesions concerning for metastasis.  PET scan recommended.  Also plan for CT-guided core biopsy for interventional radiology of the most accessible either left or right adrenal gland for confirmation of tissue diagnosis.  We will need to stop Plavix for 5 days for that to occur.He discussed these areas on adrenals with oncologist. Was advised that treatment was available if metastases.   On plavix with hx of carotid artery stenosis, left subclavian steal, PVD with iliac artery stents, history of abdominal AAA followed by vascular surgery in [place]. Prior treated by Dr. Maryjean Morn, [place] ,[place] [facility].    Pedal edema: At end of visit - asked about leg  swelling - Some increased leg swelling after ankle fracture on left, sprain on right.  treated by ortho. Left distal fibula fracture, min displaced treated with cam walker 11/17/18. Dr. Noemi Chapel.   sprained R ankle as well. Some swelling in both legs. Past 7 weeks. No new chest pains/dyspnea. No calf pain. Less walking d/t injury.  Treated for cellulitis, suspected venous stasis with pedal edema right lower extremity last year.  Also thought to have component of peripheral edema secondary to increased sodium intake.  Not currently on diuretics. Ortho told to elevate feet. Has not seen vascular provider recently - appt in January.  2 weeks ago noted blister with bandage on left foot - treating with neosporin. seems to be getting better. Small wound on R lower leg - thinks it is healing as well.  Echo 04/08/18 - EF 60-65% Wt Readings from Last 3 Encounters:  01/15/19 210 lb 6.4 oz (95.4 kg)  01/15/19 209 lb 11.2 oz (95.1 kg)  10/16/18 206 lb (93.4 kg)      History Patient Active Problem List   Diagnosis Date Noted  . Frequent nosebleeds 05/28/2018  . Encounter for antineoplastic chemotherapy 04/18/2018  . Goals of care, counseling/discussion 04/18/2018  . Adenocarcinoma of left lung, stage 2 ([place]) 03/27/2018  . Abnormal PET of left lung   . S/P bronchoscopy   . Solitary lung nodule 02/20/2018  . Hilar adenopathy 02/20/2018  . Abnormal findings on diagnostic imaging of lung 02/20/2018  . Platelet inhibition due to Plavix 02/20/2018  . PVD (peripheral vascular disease) ([place]) 10/15/2013  .  Tobacco use disorder 10/15/2013  . Pure hypercholesterolemia 10/15/2013  . Coronary artery disease involving native coronary artery of native heart without angina pectoris 10/15/2013  . Cataract 09/29/2013  . Colon cancer screening 09/25/2013  . AAA (abdominal aortic aneurysm) without rupture ([place]) 10/22/2012  . Nicotine addiction 10/17/2012  . Atherosclerosis of native arteries of the extremities with  ulceration(440.23) 10/17/2012  . Carotid bruit 10/17/2012  . Other nonspecific abnormal cardiovascular system function study 04/10/2012  . History of sciatica   . Essential hypertension, benign   . Seasonal allergies    Past Medical History:  Diagnosis Date  . AAA (abdominal aortic aneurysm) ([place])   . Allergic rhinitis   . Anxiety   . Back pain   . CAD (coronary artery disease)   . Cataracts, bilateral   . Emphysema, unspecified ([place])     " moderate"  . Essential hypertension, benign   . GERD (gastroesophageal reflux disease)   . Gout   . Headache    migraines  . Heart murmur    as a child only  . Hilar adenopathy   . History of hiatal hernia   . History of sciatica   . Hyperlipidemia   . lung ca dx'd 02/2018  . Lung nodule   . Pneumonia    as child  . Prostate hypertrophy   . PVD (peripheral vascular disease) ([place])   . Seasonal allergies   . Subclavian steal syndrome of left subclavian artery   . Wears dentures   . Wears dentures    Past Surgical History:  Procedure Laterality Date  . CARDIAC CATHETERIZATION     stent placement  . COLONOSCOPY W/ BIOPSIES AND POLYPECTOMY    . ILIAC ARTERY STENT    . MULTIPLE TOOTH EXTRACTIONS    . VIDEO BRONCHOSCOPY WITH ENDOBRONCHIAL NAVIGATION N/A 02/26/2018   Procedure: VIDEO BRONCHOSCOPY WITH ENDOBRONCHIAL NAVIGATION;  Surgeon: Garner Nash, DO;  Location: [facility];  Service: Thoracic;  Laterality: N/A;  . VIDEO BRONCHOSCOPY WITH ENDOBRONCHIAL NAVIGATION N/A 03/19/2018   Procedure: VIDEO BRONCHOSCOPY WITH ENDOBRONCHIAL NAVIGATION and endobronchial ultrasound;  Surgeon: Garner Nash, DO;  Location: [facility];  Service: Thoracic;  Laterality: N/A;  . VIDEO BRONCHOSCOPY WITH ENDOBRONCHIAL ULTRASOUND N/A 02/26/2018   Procedure: VIDEO BRONCHOSCOPY WITH ENDOBRONCHIAL ULTRASOUND;  Surgeon: Garner Nash, DO;  Location: [facility];  Service: Thoracic;  Laterality: N/A;  . VIDEO BRONCHOSCOPY WITH ENDOBRONCHIAL ULTRASOUND N/A 03/19/2018    Procedure: NAVIGATION BRONCHOSCOPY;  Surgeon: Garner Nash, DO;  Location: [facility];  Service: Thoracic;  Laterality: N/A;   Allergies  Allergen Reactions  . Biaxin [Clarithromycin] Shortness Of Breath  . Clarithromycin Shortness Of Breath  . Iodinated Diagnostic Agents Hives  . Prednisone Other (See Comments)    Patient states he turns REALLY RED all over.  . Cortisone Other (See Comments)    Turns red from breast up  . Flomax [Tamsulosin Hcl]     Blurred vision and lower back pain   . Hepatitis B Virus Vaccines     Nerve problems States his previous doctor told him it was due to this   . Iodine Hives    Dye from nuclear medicine test   Prior to Admission medications   Medication Sig Start Date End Date Taking? Authorizing Provider  aspirin EC 81 MG tablet Take 81 mg by mouth daily as needed for mild pain.   Yes [provider]  atorvastatin (LIPITOR) 40 MG tablet Take 1 tablet (40 mg total) by mouth daily.  04/09/18 04/09/19 Yes Turner, Eber Hong, MD  cetirizine (ZYRTEC) 10 MG tablet Take 10 mg by mouth daily at 12 noon.   Yes [provider]  clopidogrel (PLAVIX) 75 MG tablet TAKE 1 TABLET BY MOUTH EVERY DAY 12/21/18  Yes [name], MD  colchicine 0.6 MG tablet TAKE 1 TABLET BY MOUTH TWICE A DAY 05/07/18  Yes [name], MD  diphenhydrAMINE (BENADRYL) 25 MG tablet Take 25 mg by mouth every 6 (six) hours as needed.   Yes [provider]  famotidine (PEPCID) 20 MG tablet Take 20 mg by mouth 2 (two) times daily. Noon & in the evening.   Yes [provider]  lisinopril (ZESTRIL) 10 MG tablet Take 1 tablet (10 mg total) by mouth daily. 09/18/18  Yes [name], MD  loperamide (IMODIUM) 2 MG capsule Take 4 mg by mouth as needed for diarrhea or loose stools.   Yes [provider]  metoprolol tartrate (LOPRESSOR) 50 MG tablet TAKE 1 AND 1/2 TABLETS BY MOUTH TWICE A DAY 12/15/18  Yes [name], MD  triamcinolone cream (KENALOG)  0.1 % Apply 1 application topically 2 (two) times daily as needed. 08/29/17  Yes [name], MD   Social History   Socioeconomic History  . Marital status: Married    Spouse name: Not on file  . Number of children: 1  . Years of education: Not on file  . Highest education level: Not on file  Occupational History  . Occupation: retired  Tobacco Use  . Smoking status: Former Smoker    Packs/day: 3.00    Years: 50.00    Pack years: 150.00    Types: Cigarettes    Quit date: 04/05/2016    Years since quitting: 2.7  . Smokeless tobacco: Never Used  Substance and Sexual Activity  . Alcohol use: Yes    Alcohol/week: 0.0 standard drinks    Comment: rare  . Drug use: No  . Sexual activity: Not on file  Other Topics Concern  . Not on file  Social History Narrative   Married   Exercise: No   Education: GED   Social Determinants of Health   Financial Resource Strain: Low Risk   . Difficulty of Paying Living Expenses: Not hard at all  Food Insecurity: No Food Insecurity  . Worried About Charity fundraiser in the Last Year: Never true  . Ran Out of Food in the Last Year: Never true  Transportation Needs: No Transportation Needs  . Lack of Transportation (Medical): No  . Lack of Transportation (Non-Medical): No  Physical Activity: Inactive  . Days of Exercise per Week: 0 days  . Minutes of Exercise per Session: 0 min  Stress: No Stress Concern Present  . Feeling of Stress : Not at all  Social Connections: Somewhat Isolated  . Frequency of Communication with Friends and Family: More than three times a week  . Frequency of Social Gatherings with Friends and Family: More than three times a week  . Attends Religious Services: Never  . Active Member of Clubs or Organizations: No  . Attends Archivist Meetings: Never  . Marital Status: Married  Human resources officer Violence: Not At Risk  . Fear of Current or Ex-Partner: No  . Emotionally Abused: No  . Physically  Abused: No  . Sexually Abused: No    Review of Systems   Objective:   Vitals:   01/15/19 1122  BP: 125/76  Pulse: 87  Temp: 98.2 F (36.8 C)  TempSrc: Oral  SpO2: 97%  Weight: 210 lb 6.4 oz (95.4 kg)     Physical Exam Vitals reviewed.  Constitutional:      Appearance: He is well-developed.  HENT:     Head: Normocephalic and atraumatic.  Eyes:     Pupils: Pupils are equal, round, and reactive to light.  Neck:     Vascular: No carotid bruit or JVD.  Cardiovascular:     Rate and Rhythm: Normal rate and regular rhythm.     Heart sounds: Normal heart sounds. No murmur.  Pulmonary:     Effort: Pulmonary effort is normal.     Breath sounds: Normal breath sounds. No rales.  Musculoskeletal:     Right lower leg: No tenderness (calves nontender bilaterally. ). Edema present.     Left lower leg: No tenderness. Edema (bilateral 2+edema to prox 1/3 tibia. dorsal foot warmth., no rash or appreciable erythema. ) present.  Skin:    General: Skin is warm and dry.       Neurological:     Mental Status: He is alert and oriented to person, place, and time.    approx 45 min face to face care with greater than 50% counseling. 2nd MD exam of leg swelling/wounds as well.   Assessment & Plan:  CADIN LUKA is a 73 y.o. male . Essential hypertension  -Stable, no med changes at this time.  Continue same meds  Malignant neoplasm of lung, unspecified laterality, unspecified part of lung ([place])  -Concern for possible increasing adrenal lesions and metastatic disease.  PET scan planned, adrenal biopsy planned, will need to hold Plavix but will ask vascular to comment on timing.  Note sent with their information back to oncology.  PVD (peripheral vascular disease) ([facility]) Pedal edema - Plan: furosemide (LASIX) 20 MG tablet, potassium chloride SA (KLOR-CON) 10 MEQ tablet, doxycycline (VIBRA-TABS) 100 MG tablet Open wound of left foot with complication, initial encounter - Plan: doxycycline  (VIBRA-TABS) 100 MG tablet  -Peripheral edema with likely shallow ulcer on left foot versus abrasion from boot, small area of fluid expressed on right lower leg.  Could be complicated by decreased mobility from CAM Walker versus dietary sodium intake along with underlying peripheral vascular disease.  -Start doxycycline, Lasix 20 mg daily with potassium 10 mg daily for the next 3 days, recheck in 4 days in office. consider unna boot depending on progression next week.  RTC/ER precautions  Meds ordered this encounter  Medications  . furosemide (LASIX) 20 MG tablet    Sig: Take 1 tablet (20 mg total) by mouth daily.    Dispense:  3 tablet    Refill:  0  . potassium chloride SA (KLOR-CON) 10 MEQ tablet    Sig: Take 1 tablet (10 mEq total) by mouth daily.    Dispense:  3 tablet    Refill:  0  . doxycycline (VIBRA-TABS) 100 MG tablet    Sig: Take 1 tablet (100 mg total) by mouth 2 (two) times daily.    Dispense:  20 tablet    Refill:  0   Patient Instructions    I would like your vascular specialist decide if it is ok to hold the Plavix for the adrenal biopsy. I sent a message back to oncology with contact info to coordinate that clearance.   No change in blood pressure meds for now.  Once you have biopsy of adrenal, your oncologist will be able to give you more  information about the next steps.   For swelling and wounds in legs - start lasix once per day, and potassium once per day, as well as antibiotic twice per day.   Keep wounds clean with soap and water at least once per day, and covered.  Elevate legs when able to help with swelling.  Recheck with me in 4 days.  Try to avoid extra salt in the diet as well.  See other information below on swelling.  Thank you for coming in today and take care.   Peripheral Edema  Peripheral edema is swelling that is caused by a buildup of fluid. Peripheral edema most often affects the lower legs, ankles, and feet. It can also develop in the arms,  hands, and face. The area of the body that has peripheral edema will look swollen. It may also feel heavy or warm. Your clothes may start to feel tight. Pressing on the area may make a temporary dent in your skin. You may not be able to move your swollen arm or leg as much as usual. There are many causes of peripheral edema. It can happen because of a complication of other conditions such as congestive heart failure, kidney disease, or a problem with your blood circulation. It also can be a side effect of certain medicines or because of an infection. It often happens to women during pregnancy. Sometimes, the cause is not known. Follow these instructions at home: Managing pain, stiffness, and swelling   Raise (elevate) your legs while you are sitting or lying down.  Move around often to prevent stiffness and to lessen swelling.  Do not sit or stand for long periods of time.  Wear support stockings as told by your health care provider. Medicines  Take over-the-counter and prescription medicines only as told by your health care provider.  Your health care provider may prescribe medicine to help your body get rid of excess water (diuretic). General instructions  Pay attention to any changes in your symptoms.  Follow instructions from your health care provider about limiting salt (sodium) in your diet. Sometimes, eating less salt may reduce swelling.  Moisturize skin daily to help prevent skin from cracking and draining.  Keep all follow-up visits as told by your health care provider. This is important. Contact a health care provider if you have:  A fever.  Edema that starts suddenly or is getting worse, especially if you are pregnant or have a medical condition.  Swelling in only one leg.  Increased swelling, redness, or pain in one or both of your legs.  Drainage or sores at the area where you have edema. Get help right away if you:  Develop shortness of breath, especially when  you are lying down.  Have pain in your chest or abdomen.  Feel weak.  Feel faint. Summary  Peripheral edema is swelling that is caused by a buildup of fluid. Peripheral edema most often affects the lower legs, ankles, and feet.  Move around often to prevent stiffness and to lessen swelling. Do not sit or stand for long periods of time.  Pay attention to any changes in your symptoms.  Contact a health care provider if you have edema that starts suddenly or is getting worse, especially if you are pregnant or have a medical condition.  Get help right away if you develop shortness of breath, especially when lying down. This information is not intended to replace advice given to you by your health care provider. Make sure you  discuss any questions you have with your health care provider. Document Released: 03/01/2004 Document Revised: 10/16/2017 Document Reviewed: 10/16/2017 Elsevier Patient Education  [employer].     If you have lab work done today you will be contacted with your lab results within the next 2 weeks.  If you have not heard from [place] then please contact us. The fastest way to get your results is to register for My Chart.   IF you received an x-ray today, you will receive an invoice from [facility]. Please contact [facility] at [phone] with questions or concerns regarding your invoice.   IF you received labwork today, you will receive an invoice from [facility]. Please contact [facility] at [phone] with questions or concerns regarding your invoice.   Our billing staff will not be able to assist you with questions regarding bills from these companies.  You will be contacted with the lab results as soon as they are available. The fastest way to get your results is to activate your My Chart account. Instructions are located on the last page of this paperwork. If you have not heard from [place] regarding the results in 2 weeks, please contact this  office.          Signed, [name], MD [facility]

## 2019-01-15 NOTE — Patient Instructions (Addendum)
I would like your vascular specialist decide if it is ok to hold the Plavix for the adrenal biopsy. I sent a message back to oncology with contact info to coordinate that clearance.   No change in blood pressure meds for now.  Once you have biopsy of adrenal, your oncologist will be able to give you more information about the next steps.   For swelling and wounds in legs - start lasix once per day, and potassium once per day, as well as antibiotic twice per day.   Keep wounds clean with soap and water at least once per day, and covered.  Elevate legs when able to help with swelling.  Recheck with me in 4 days.  Try to avoid extra salt in the diet as well.  See other information below on swelling.  Thank you for coming in today and take care.   Peripheral Edema  Peripheral edema is swelling that is caused by a buildup of fluid. Peripheral edema most often affects the lower legs, ankles, and feet. It can also develop in the arms, hands, and face. The area of the body that has peripheral edema will look swollen. It may also feel heavy or warm. Your clothes may start to feel tight. Pressing on the area may make a temporary dent in your skin. You may not be able to move your swollen arm or leg as much as usual. There are many causes of peripheral edema. It can happen because of a complication of other conditions such as congestive heart failure, kidney disease, or a problem with your blood circulation. It also can be a side effect of certain medicines or because of an infection. It often happens to women during pregnancy. Sometimes, the cause is not known. Follow these instructions at home: Managing pain, stiffness, and swelling   Raise (elevate) your legs while you are sitting or lying down.  Move around often to prevent stiffness and to lessen swelling.  Do not sit or stand for long periods of time.  Wear support stockings as told by your health care provider. Medicines  Take  over-the-counter and prescription medicines only as told by your health care provider.  Your health care provider may prescribe medicine to help your body get rid of excess water (diuretic). General instructions  Pay attention to any changes in your symptoms.  Follow instructions from your health care provider about limiting salt (sodium) in your diet. Sometimes, eating less salt may reduce swelling.  Moisturize skin daily to help prevent skin from cracking and draining.  Keep all follow-up visits as told by your health care provider. This is important. Contact a health care provider if you have:  A fever.  Edema that starts suddenly or is getting worse, especially if you are pregnant or have a medical condition.  Swelling in only one leg.  Increased swelling, redness, or pain in one or both of your legs.  Drainage or sores at the area where you have edema. Get help right away if you:  Develop shortness of breath, especially when you are lying down.  Have pain in your chest or abdomen.  Feel weak.  Feel faint. Summary  Peripheral edema is swelling that is caused by a buildup of fluid. Peripheral edema most often affects the lower legs, ankles, and feet.  Move around often to prevent stiffness and to lessen swelling. Do not sit or stand for long periods of time.  Pay attention to any changes in your symptoms.  Contact a  health care provider if you have edema that starts suddenly or is getting worse, especially if you are pregnant or have a medical condition.  Get help right away if you develop shortness of breath, especially when lying down. This information is not intended to replace advice given to you by your health care provider. Make sure you discuss any questions you have with your health care provider. Document Released: 03/01/2004 Document Revised: 10/16/2017 Document Reviewed: 10/16/2017 Elsevier Patient Education  [employer].     If you have lab work  done today you will be contacted with your lab results within the next 2 weeks.  If you have not heard from [place] then please contact us. The fastest way to get your results is to register for My Chart.   IF you received an x-ray today, you will receive an invoice from [facility]. Please contact [facility] Radiology at [phone] with questions or concerns regarding your invoice.   IF you received labwork today, you will receive an invoice from [facility]. Please contact [facility] at [phone] with questions or concerns regarding your invoice.   Our billing staff will not be able to assist you with questions regarding bills from these companies.  You will be contacted with the lab results as soon as they are available. The fastest way to get your results is to activate your My Chart account. Instructions are located on the last page of this paperwork. If you have not heard from [place] regarding the results in 2 weeks, please contact this office.

## 2019-01-15 NOTE — Telephone Encounter (Signed)
Scheduled appt per 12/10 los - gave patient AVS and calender per los.

## 2019-01-15 NOTE — Progress Notes (Signed)
Seth [name]. [place] Male, 73 y.o., [birth year] MRN:  [id number] Phone:  [phone] [name]) PCP:  [name], MD Primary Cvg:  Medicare/Medicare Part A And B Next Appt With Radiology ([facility] PET) 01/22/2019 at 8:00 AM  RE: CT Biopsy Received: Today Message Contents  Curt Bears, MD  Cherrie Gauze, RN  Okay to hold his Plavix for the 5 days for the biopsy.       Previous Messages   ----- Message -----  From: [name] D  Sent: 01/15/2019  2:29 PM EST  To: Curt Bears, MD, Ardeen Garland, RN  Subject: FW: CT Biopsy                   Please contact the Vascular Surgeon per Dr [name] in regards to holding Plavix for 5 days prior to biopsy. Dr [name] is the ordering Provider for patients Plavix.  Thanks  ----- Message -----  From: [name], MD  Sent: 01/15/2019 11:56 AM EST  To: [name]  Subject: RE: CT Biopsy                   Thanks for the message. I am seeing [name] now.  He is followed by vascular surgery - I would like them to make decision on holding plavix and timing - here is their contact info:  Charlane Ferretti, [address]  [place], [place] [address]  Phone: [phone]  Fax: [phone]   Thanks,  Janeann Forehand  ----- Message -----  From: [name] D  Sent: 01/15/2019 11:08 AM EST  To: [name], MD, Curt Bears, MD  Subject: CT Biopsy                     Dr. Carlota Raspberry, [name] has a biopsy that has been ordered by Dr. Julien Nordmann. I see that you have patient on Plavix and in order to get his biopsy done patient will need to hold his Plavix for 5 days prior to his biopsy. I need to know if it is okay to hold his Plavix.   Thanks [name]

## 2019-01-19 ENCOUNTER — Other Ambulatory Visit: Payer: Self-pay

## 2019-01-19 ENCOUNTER — Encounter: Payer: Self-pay | Admitting: Family Medicine

## 2019-01-19 ENCOUNTER — Ambulatory Visit (INDEPENDENT_AMBULATORY_CARE_PROVIDER_SITE_OTHER): Payer: Medicare Other | Admitting: Family Medicine

## 2019-01-19 VITALS — BP 114/73 | HR 74 | Temp 97.7°F | Wt 210.2 lb

## 2019-01-19 DIAGNOSIS — I2510 Atherosclerotic heart disease of native coronary artery without angina pectoris: Secondary | ICD-10-CM | POA: Diagnosis not present

## 2019-01-19 DIAGNOSIS — R600 Localized edema: Secondary | ICD-10-CM

## 2019-01-19 MED ORDER — POTASSIUM CHLORIDE CRYS ER 10 MEQ PO TBCR: 10.0000 meq | EXTENDED_RELEASE_TABLET | Freq: Every day | ORAL | 0 refills | Status: DC

## 2019-01-19 MED ORDER — FUROSEMIDE 20 MG PO TABS: 20.0000 mg | ORAL_TABLET | Freq: Every day | ORAL | 0 refills | Status: DC

## 2019-01-19 NOTE — Progress Notes (Signed)
Subjective:  Patient ID: Seth [name], male    DOB: 1945/12/04  Age: 73 y.o. MRN: [id number]  CC:  Chief Complaint  Patient presents with  . Follow-up    4 day F/U for leg swellling and wound care    HPI Seth [name] presents for   Pedal edema: History of peripheral vascular disease, treated for venous stasis dermatitis last year.  Has had some ongoing pedal edema, but has noticed worsening with the surface activity, use of Cam walker for recent ankle fracture/sprains.  Treated with orthopedics.  noted to have wound of dorsum of left foot (wearing CAM Walker) as well as some slight discharge from the right lower leg at his last visit 4 days ago.  He was started on doxycycline to cover for open wound on left foot, bandaged, and lasix 20 mg daily x3 days, potassium 10 mEq daily.  Took 3 days of furosemide  - swelling still there, maybe a little better.  Tolerating antibiotic. No diarrhea.  No fever, no new redness.  More swollen today than yesterday, but still better than last week.  Has discussed edema with vascular in past - ok to use compression stoclings, but not sure how helpful they would be. Uses small stocking at ankles.   Has adrenal biopsy in 1 week.  History Patient Active Problem List   Diagnosis Date Noted  . Frequent nosebleeds 05/28/2018  . Encounter for antineoplastic chemotherapy 04/18/2018  . Goals of care, counseling/discussion 04/18/2018  . Adenocarcinoma of left lung, stage 2 ([place]) 03/27/2018  . Abnormal PET of left lung   . S/P bronchoscopy   . Solitary lung nodule 02/20/2018  . Hilar adenopathy 02/20/2018  . Abnormal findings on diagnostic imaging of lung 02/20/2018  . Platelet inhibition due to Plavix 02/20/2018  . PVD (peripheral vascular disease) ([place]) 10/15/2013  . Tobacco use disorder 10/15/2013  . Pure hypercholesterolemia 10/15/2013  . Coronary artery disease involving native coronary artery of native heart without angina pectoris 10/15/2013  .  Cataract 09/29/2013  . Colon cancer screening 09/25/2013  . AAA (abdominal aortic aneurysm) without rupture ([place]) 10/22/2012  . Nicotine addiction 10/17/2012  . Atherosclerosis of native arteries of the extremities with ulceration(440.23) 10/17/2012  . Carotid bruit 10/17/2012  . Other nonspecific abnormal cardiovascular system function study 04/10/2012  . History of sciatica   . Essential hypertension, benign   . Seasonal allergies    Past Medical History:  Diagnosis Date  . AAA (abdominal aortic aneurysm) ([place])   . Allergic rhinitis   . Anxiety   . Back pain   . CAD (coronary artery disease)   . Cataracts, bilateral   . Emphysema, unspecified ([place])     " moderate"  . Essential hypertension, benign   . GERD (gastroesophageal reflux disease)   . Gout   . Headache    migraines  . Heart murmur    as a child only  . Hilar adenopathy   . History of hiatal hernia   . History of sciatica   . Hyperlipidemia   . lung ca dx'd 02/2018  . Lung nodule   . Pneumonia    as child  . Prostate hypertrophy   . PVD (peripheral vascular disease) ([place])   . Seasonal allergies   . Subclavian steal syndrome of left subclavian artery   . Wears dentures   . Wears dentures    Past Surgical History:  Procedure Laterality Date  . CARDIAC CATHETERIZATION     stent placement  .  COLONOSCOPY W/ BIOPSIES AND POLYPECTOMY    . ILIAC ARTERY STENT    . MULTIPLE TOOTH EXTRACTIONS    . VIDEO BRONCHOSCOPY WITH ENDOBRONCHIAL NAVIGATION N/A 02/26/2018   Procedure: VIDEO BRONCHOSCOPY WITH ENDOBRONCHIAL NAVIGATION;  Surgeon: Garner Nash, DO;  Location: [facility];  Service: Thoracic;  Laterality: N/A;  . VIDEO BRONCHOSCOPY WITH ENDOBRONCHIAL NAVIGATION N/A 03/19/2018   Procedure: VIDEO BRONCHOSCOPY WITH ENDOBRONCHIAL NAVIGATION and endobronchial ultrasound;  Surgeon: Garner Nash, DO;  Location: [facility];  Service: Thoracic;  Laterality: N/A;  . VIDEO BRONCHOSCOPY WITH ENDOBRONCHIAL ULTRASOUND N/A 02/26/2018     Procedure: VIDEO BRONCHOSCOPY WITH ENDOBRONCHIAL ULTRASOUND;  Surgeon: Garner Nash, DO;  Location: [facility];  Service: Thoracic;  Laterality: N/A;  . VIDEO BRONCHOSCOPY WITH ENDOBRONCHIAL ULTRASOUND N/A 03/19/2018   Procedure: NAVIGATION BRONCHOSCOPY;  Surgeon: Garner Nash, DO;  Location: [facility];  Service: Thoracic;  Laterality: N/A;   Allergies  Allergen Reactions  . Biaxin [Clarithromycin] Shortness Of Breath  . Clarithromycin Shortness Of Breath  . Iodinated Diagnostic Agents Hives  . Prednisone Other (See Comments)    Patient states he turns REALLY RED all over.  . Cortisone Other (See Comments)    Turns red from breast up  . Flomax [Tamsulosin Hcl]     Blurred vision and lower back pain   . Hepatitis B Virus Vaccines     Nerve problems States his previous doctor told him it was due to this   . Iodine Hives    Dye from nuclear medicine test   Prior to Admission medications   Medication Sig Start Date End Date Taking? Authorizing Provider  aspirin EC 81 MG tablet Take 81 mg by mouth daily as needed for mild pain.   Yes [provider]  atorvastatin (LIPITOR) 40 MG tablet Take 1 tablet (40 mg total) by mouth daily. 04/09/18 04/09/19 Yes Turner, Eber Hong, MD  cetirizine (ZYRTEC) 10 MG tablet Take 10 mg by mouth daily at 12 noon.   Yes [provider]  clopidogrel (PLAVIX) 75 MG tablet TAKE 1 TABLET BY MOUTH EVERY DAY 12/21/18  Yes [name], MD  doxycycline (VIBRA-TABS) 100 MG tablet Take 1 tablet (100 mg total) by mouth 2 (two) times daily. 01/15/19  Yes [name], MD  famotidine (PEPCID) 20 MG tablet Take 20 mg by mouth 2 (two) times daily. Noon & in the evening.   Yes [provider]  lisinopril (ZESTRIL) 10 MG tablet Take 1 tablet (10 mg total) by mouth daily. 09/18/18  Yes [name], MD  loperamide (IMODIUM) 2 MG capsule Take 4 mg by mouth as needed for diarrhea or loose stools.   Yes [provider]  metoprolol  tartrate (LOPRESSOR) 50 MG tablet TAKE 1 AND 1/2 TABLETS BY MOUTH TWICE A DAY 12/15/18  Yes [name], MD   Social History   Socioeconomic History  . Marital status: Married    Spouse name: Not on file  . Number of children: 1  . Years of education: Not on file  . Highest education level: Not on file  Occupational History  . Occupation: retired  Tobacco Use  . Smoking status: Former Smoker    Packs/day: 3.00    Years: 50.00    Pack years: 150.00    Types: Cigarettes    Quit date: 04/05/2016    Years since quitting: 2.7  . Smokeless tobacco: Never Used  Substance and Sexual Activity  . Alcohol use: Yes  Alcohol/week: 0.0 standard drinks    Comment: rare  . Drug use: No  . Sexual activity: Not on file  Other Topics Concern  . Not on file  Social History Narrative   Married   Exercise: No   Education: GED   Social Determinants of Radio broadcast assistant Strain:   . Difficulty of Paying Living Expenses: Not on file  Food Insecurity:   . Worried About Charity fundraiser in the Last Year: Not on file  . Ran Out of Food in the Last Year: Not on file  Transportation Needs:   . Lack of Transportation (Medical): Not on file  . Lack of Transportation (Non-Medical): Not on file  Physical Activity:   . Days of Exercise per Week: Not on file  . Minutes of Exercise per Session: Not on file  Stress:   . Feeling of Stress : Not on file  Social Connections:   . Frequency of Communication with Friends and Family: Not on file  . Frequency of Social Gatherings with Friends and Family: Not on file  . Attends Religious Services: Not on file  . Active Member of Clubs or Organizations: Not on file  . Attends Archivist Meetings: Not on file  . Marital Status: Not on file  Intimate Partner Violence:   . Fear of Current or Ex-Partner: Not on file  . Emotionally Abused: Not on file  . Physically Abused: Not on file  . Sexually Abused: Not on file    Review of  Systems Per HPI.   Objective:   Vitals:   01/19/19 1122  BP: 114/73  Pulse: 74  Temp: 97.7 F (36.5 C)  TempSrc: Temporal  SpO2: 98%  Weight: 210 lb 3.2 oz (95.3 kg)     Physical Exam Vitals reviewed.  Constitutional:      General: He is not in acute distress.    Appearance: He is well-developed.  HENT:     Head: Normocephalic and atraumatic.  Cardiovascular:     Rate and Rhythm: Normal rate.  Pulmonary:     Effort: Pulmonary effort is normal.  Musculoskeletal:     Right lower leg: 3+ Pitting Edema present.     Left lower leg: 2+ Pitting Edema present.       Legs:  Neurological:     Mental Status: He is alert and oriented to person, place, and time.        Assessment & Plan:  FILIMON MIRANDA is a 73 y.o. male . Pedal edema - Plan: Basic metabolic panel, potassium chloride (KLOR-CON) 10 MEQ tablet, furosemide (LASIX) 20 MG tablet  -Minimal change since last visit but improved this weekend.  Wounds stable.   -Low intensity compression stockings discussed if cleared to use from his vascular specialist.  -Repeat Lasix 20 mg daily x3 days along with potassium.  Wound care discussed, elevation of legs discussed, recheck in 8 days.  RTC precautions given.  Meds ordered this encounter  Medications  . potassium chloride (KLOR-CON) 10 MEQ tablet    Sig: Take 1 tablet (10 mEq total) by mouth daily.    Dispense:  3 tablet    Refill:  0  . furosemide (LASIX) 20 MG tablet    Sig: Take 1 tablet (20 mg total) by mouth daily.    Dispense:  3 tablet    Refill:  0   Patient Instructions    Light intensity compression stocking to knees may help swelling IF those are ok  per your vascular specialist.  Lasix few more days with potassium. Clean wounds 1-2 times per day with soap and water, then telfa/nonstick pad and gauze wrap (not too tight). Continue antibiotic until finished.  Recheck in 8 days with Dr. Pamella Pert. telemedicine visit initially - video ideally so we can see  these areas. If needed, we can have you come back in office.   Return to the clinic or go to the nearest emergency room if any of your symptoms worsen or new symptoms occur.   Edema  Edema is an abnormal buildup of fluids in the body tissues and under the skin. Swelling of the legs, feet, and ankles is a common symptom that becomes more likely as you get older. Swelling is also common in looser tissues, like around the eyes. When the affected area is squeezed, the fluid may move out of that spot and leave a dent for a few moments. This dent is called pitting edema. There are many possible causes of edema. Eating too much salt (sodium) and being on your feet or sitting for a long time can cause edema in your legs, feet, and ankles. Hot weather may make edema worse. Common causes of edema include:  Heart failure.  Liver or kidney disease.  Weak leg blood vessels.  Cancer.  An injury.  Pregnancy.  Medicines.  Being obese.  Low protein levels in the blood. Edema is usually painless. Your skin may look swollen or shiny. Follow these instructions at home:  Keep the affected body part raised (elevated) above the level of your heart when you are sitting or lying down.  Do not sit still or stand for long periods of time.  Do not wear tight clothing. Do not wear garters on your upper legs.  Exercise your legs to get your circulation going. This helps to move the fluid back into your blood vessels, and it may help the swelling go down.  Wear elastic bandages or support stockings to reduce swelling as told by your health care provider.  Eat a low-salt (low-sodium) diet to reduce fluid as told by your health care provider.  Depending on the cause of your swelling, you may need to limit how much fluid you drink (fluid restriction).  Take over-the-counter and prescription medicines only as told by your health care provider. Contact a health care provider if:  Your edema does not get  better with treatment.  You have heart, liver, or kidney disease and have symptoms of edema.  You have sudden and unexplained weight gain. Get help right away if:  You develop shortness of breath or chest pain.  You cannot breathe when you lie down.  You develop pain, redness, or warmth in the swollen areas.  You have heart, liver, or kidney disease and suddenly get edema.  You have a fever and your symptoms suddenly get worse. Summary  Edema is an abnormal buildup of fluids in the body tissues and under the skin.  Eating too much salt (sodium) and being on your feet or sitting for a long time can cause edema in your legs, feet, and ankles.  Keep the affected body part raised (elevated) above the level of your heart when you are sitting or lying down. This information is not intended to replace advice given to you by your health care provider. Make sure you discuss any questions you have with your health care provider. Document Released: 01/22/2005 Document Revised: 01/25/2017 Document Reviewed: 02/25/2016 Elsevier Patient Education  2020 [employer].  If you have lab work done today you will be contacted with your lab results within the next 2 weeks.  If you have not heard from [place] then please contact us. The fastest way to get your results is to register for My Chart.   IF you received an x-ray today, you will receive an invoice from [facility]. Please contact [facility] Radiology at [phone] with questions or concerns regarding your invoice.   IF you received labwork today, you will receive an invoice from [facility]. Please contact [facility] at [phone] with questions or concerns regarding your invoice.   Our billing staff will not be able to assist you with questions regarding bills from these companies.  You will be contacted with the lab results as soon as they are available. The fastest way to get your results is to activate your My Chart account.  Instructions are located on the last page of this paperwork. If you have not heard from [place] regarding the results in 2 weeks, please contact this office.         Signed, [name], MD [facility]

## 2019-01-19 NOTE — Patient Instructions (Addendum)
Light intensity compression stocking to knees may help swelling IF those are ok per your vascular specialist.  Lasix few more days with potassium. Clean wounds 1-2 times per day with soap and water, then telfa/nonstick pad and gauze wrap (not too tight). Continue antibiotic until finished.  Recheck in 8 days with Dr. Pamella Pert. telemedicine visit initially - video ideally so we can see these areas. If needed, we can have you come back in office.   Return to the clinic or go to the nearest emergency room if any of your symptoms worsen or new symptoms occur.   Edema  Edema is an abnormal buildup of fluids in the body tissues and under the skin. Swelling of the legs, feet, and ankles is a common symptom that becomes more likely as you get older. Swelling is also common in looser tissues, like around the eyes. When the affected area is squeezed, the fluid may move out of that spot and leave a dent for a few moments. This dent is called pitting edema. There are many possible causes of edema. Eating too much salt (sodium) and being on your feet or sitting for a long time can cause edema in your legs, feet, and ankles. Hot weather may make edema worse. Common causes of edema include:  Heart failure.  Liver or kidney disease.  Weak leg blood vessels.  Cancer.  An injury.  Pregnancy.  Medicines.  Being obese.  Low protein levels in the blood. Edema is usually painless. Your skin may look swollen or shiny. Follow these instructions at home:  Keep the affected body part raised (elevated) above the level of your heart when you are sitting or lying down.  Do not sit still or stand for long periods of time.  Do not wear tight clothing. Do not wear garters on your upper legs.  Exercise your legs to get your circulation going. This helps to move the fluid back into your blood vessels, and it may help the swelling go down.  Wear elastic bandages or support stockings to reduce swelling as told  by your health care provider.  Eat a low-salt (low-sodium) diet to reduce fluid as told by your health care provider.  Depending on the cause of your swelling, you may need to limit how much fluid you drink (fluid restriction).  Take over-the-counter and prescription medicines only as told by your health care provider. Contact a health care provider if:  Your edema does not get better with treatment.  You have heart, liver, or kidney disease and have symptoms of edema.  You have sudden and unexplained weight gain. Get help right away if:  You develop shortness of breath or chest pain.  You cannot breathe when you lie down.  You develop pain, redness, or warmth in the swollen areas.  You have heart, liver, or kidney disease and suddenly get edema.  You have a fever and your symptoms suddenly get worse. Summary  Edema is an abnormal buildup of fluids in the body tissues and under the skin.  Eating too much salt (sodium) and being on your feet or sitting for a long time can cause edema in your legs, feet, and ankles.  Keep the affected body part raised (elevated) above the level of your heart when you are sitting or lying down. This information is not intended to replace advice given to you by your health care provider. Make sure you discuss any questions you have with your health care provider. Document Released: 01/22/2005 Document  Revised: 01/25/2017 Document Reviewed: 02/25/2016 Elsevier Patient Education  [employer].   If you have lab work done today you will be contacted with your lab results within the next 2 weeks.  If you have not heard from [place] then please contact us. The fastest way to get your results is to register for My Chart.   IF you received an x-ray today, you will receive an invoice from [facility] Radiology. Please contact [facility] Radiology at [phone] with questions or concerns regarding your invoice.   IF you received labwork today, you will  receive an invoice from [facility]. Please contact [facility] at [phone] with questions or concerns regarding your invoice.   Our billing staff will not be able to assist you with questions regarding bills from these companies.  You will be contacted with the lab results as soon as they are available. The fastest way to get your results is to activate your My Chart account. Instructions are located on the last page of this paperwork. If you have not heard from [place] regarding the results in 2 weeks, please contact this office.

## 2019-01-20 LAB — BASIC METABOLIC PANEL
BUN/Creatinine Ratio: 9 — ABNORMAL LOW (ref 10–24)
BUN: 9 mg/dL (ref 8–27)
CO2: 20 mmol/L (ref 20–29)
Calcium: 9.4 mg/dL (ref 8.6–10.2)
Chloride: 95 mmol/L — ABNORMAL LOW (ref 96–106)
Creatinine, Ser: 1.02 mg/dL (ref 0.76–1.27)
GFR calc Af Amer: 84 mL/min/{1.73_m2} (ref >59)
GFR calc non Af Amer: 73 mL/min/{1.73_m2} (ref >59)
Glucose: 81 mg/dL (ref 65–99)
Potassium: 4.7 mmol/L (ref 3.5–5.2)
Sodium: 133 mmol/L — ABNORMAL LOW (ref 134–144)

## 2019-01-22 ENCOUNTER — Other Ambulatory Visit: Payer: Self-pay

## 2019-01-22 ENCOUNTER — Ambulatory Visit (HOSPITAL_COMMUNITY)
Admission: RE | Admit: 2019-01-22 | Discharge: 2019-01-22 | Disposition: A | Discharge status: Home / Self Care | Payer: Medicare Other | Source: Ambulatory Visit | Attending: Internal Medicine | Admitting: Internal Medicine

## 2019-01-22 DIAGNOSIS — Z888 Allergy status to other drugs, medicaments and biological substances status: Secondary | ICD-10-CM | POA: Diagnosis not present

## 2019-01-22 DIAGNOSIS — Z87891 Personal history of nicotine dependence: Secondary | ICD-10-CM | POA: Insufficient documentation

## 2019-01-22 DIAGNOSIS — C7951 Secondary malignant neoplasm of bone: Secondary | ICD-10-CM | POA: Insufficient documentation

## 2019-01-22 DIAGNOSIS — Z833 Family history of diabetes mellitus: Secondary | ICD-10-CM | POA: Insufficient documentation

## 2019-01-22 DIAGNOSIS — C7972 Secondary malignant neoplasm of left adrenal gland: Secondary | ICD-10-CM | POA: Insufficient documentation

## 2019-01-22 DIAGNOSIS — E785 Hyperlipidemia, unspecified: Secondary | ICD-10-CM | POA: Diagnosis not present

## 2019-01-22 DIAGNOSIS — Z7902 Long term (current) use of antithrombotics/antiplatelets: Secondary | ICD-10-CM | POA: Insufficient documentation

## 2019-01-22 DIAGNOSIS — E279 Disorder of adrenal gland, unspecified: Secondary | ICD-10-CM | POA: Diagnosis present

## 2019-01-22 DIAGNOSIS — I739 Peripheral vascular disease, unspecified: Secondary | ICD-10-CM | POA: Insufficient documentation

## 2019-01-22 DIAGNOSIS — Z9221 Personal history of antineoplastic chemotherapy: Secondary | ICD-10-CM | POA: Diagnosis not present

## 2019-01-22 DIAGNOSIS — I2510 Atherosclerotic heart disease of native coronary artery without angina pectoris: Secondary | ICD-10-CM | POA: Insufficient documentation

## 2019-01-22 DIAGNOSIS — Z887 Allergy status to serum and vaccine status: Secondary | ICD-10-CM | POA: Insufficient documentation

## 2019-01-22 DIAGNOSIS — C3491 Malignant neoplasm of unspecified part of right bronchus or lung: Secondary | ICD-10-CM | POA: Diagnosis not present

## 2019-01-22 DIAGNOSIS — Z79899 Other long term (current) drug therapy: Secondary | ICD-10-CM | POA: Diagnosis not present

## 2019-01-22 DIAGNOSIS — I714 Abdominal aortic aneurysm, without rupture: Secondary | ICD-10-CM | POA: Insufficient documentation

## 2019-01-22 DIAGNOSIS — Z923 Personal history of irradiation: Secondary | ICD-10-CM | POA: Diagnosis not present

## 2019-01-22 DIAGNOSIS — I10 Essential (primary) hypertension: Secondary | ICD-10-CM | POA: Diagnosis not present

## 2019-01-22 DIAGNOSIS — Z881 Allergy status to other antibiotic agents status: Secondary | ICD-10-CM | POA: Diagnosis not present

## 2019-01-22 DIAGNOSIS — Z8249 Family history of ischemic heart disease and other diseases of the circulatory system: Secondary | ICD-10-CM | POA: Insufficient documentation

## 2019-01-22 DIAGNOSIS — Z7982 Long term (current) use of aspirin: Secondary | ICD-10-CM | POA: Insufficient documentation

## 2019-01-22 DIAGNOSIS — Z823 Family history of stroke: Secondary | ICD-10-CM | POA: Insufficient documentation

## 2019-01-22 DIAGNOSIS — K219 Gastro-esophageal reflux disease without esophagitis: Secondary | ICD-10-CM | POA: Diagnosis not present

## 2019-01-22 DIAGNOSIS — Z91041 Radiographic dye allergy status: Secondary | ICD-10-CM | POA: Insufficient documentation

## 2019-01-22 DIAGNOSIS — C3490 Malignant neoplasm of unspecified part of unspecified bronchus or lung: Secondary | ICD-10-CM

## 2019-01-22 LAB — GLUCOSE, CAPILLARY: Glucose-Capillary: 114 mg/dL — ABNORMAL HIGH (ref 70–99)

## 2019-01-22 MED ORDER — FLUDEOXYGLUCOSE F - 18 (FDG) INJECTION
10.5000 | Freq: Once | INTRAVENOUS | Status: AC | PRN
  Administered 2019-01-22: 10.5 via INTRAVENOUS

## 2019-01-26 ENCOUNTER — Ambulatory Visit (HOSPITAL_COMMUNITY)
Admission: RE | Admit: 2019-01-26 | Discharge: 2019-01-26 | Disposition: A | Discharge status: Home / Self Care | Payer: Medicare Other | Source: Ambulatory Visit | Attending: Internal Medicine | Admitting: Internal Medicine

## 2019-01-26 ENCOUNTER — Encounter (HOSPITAL_COMMUNITY): Payer: Self-pay

## 2019-01-26 ENCOUNTER — Other Ambulatory Visit: Payer: Self-pay

## 2019-01-26 DIAGNOSIS — C3411 Malignant neoplasm of upper lobe, right bronchus or lung: Secondary | ICD-10-CM | POA: Diagnosis not present

## 2019-01-26 DIAGNOSIS — C3490 Malignant neoplasm of unspecified part of unspecified bronchus or lung: Secondary | ICD-10-CM

## 2019-01-26 DIAGNOSIS — C7972 Secondary malignant neoplasm of left adrenal gland: Secondary | ICD-10-CM | POA: Diagnosis not present

## 2019-01-26 DIAGNOSIS — C3491 Malignant neoplasm of unspecified part of right bronchus or lung: Secondary | ICD-10-CM | POA: Diagnosis not present

## 2019-01-26 DIAGNOSIS — C7492 Malignant neoplasm of unspecified part of left adrenal gland: Secondary | ICD-10-CM | POA: Diagnosis not present

## 2019-01-26 LAB — CBC WITH DIFFERENTIAL/PLATELET
Abs Immature Granulocytes: 0.03 10*3/uL (ref 0.00–0.07)
Basophils Absolute: 0 10*3/uL (ref 0.0–0.1)
Basophils Relative: 1 %
Eosinophils Absolute: 0.2 10*3/uL (ref 0.0–0.5)
Eosinophils Relative: 3 %
HCT: 41.9 % (ref 39.0–52.0)
Hemoglobin: 13.7 g/dL (ref 13.0–17.0)
Immature Granulocytes: 1 %
Lymphocytes Relative: 11 %
Lymphs Abs: 0.7 10*3/uL (ref 0.7–4.0)
MCH: 30.6 pg (ref 26.0–34.0)
MCHC: 32.7 g/dL (ref 30.0–36.0)
MCV: 93.7 fL (ref 80.0–100.0)
Monocytes Absolute: 0.5 10*3/uL (ref 0.1–1.0)
Monocytes Relative: 8 %
Neutro Abs: 5.2 10*3/uL (ref 1.7–7.7)
Neutrophils Relative %: 76 %
Platelets: 194 10*3/uL (ref 150–400)
RBC: 4.47 MIL/uL (ref 4.22–5.81)
RDW: 15.3 % (ref 11.5–15.5)
WBC: 6.6 10*3/uL (ref 4.0–10.5)
nRBC: 0 % (ref 0.0–0.2)

## 2019-01-26 LAB — PROTIME-INR
INR: 1.1 (ref 0.8–1.2)
Prothrombin Time: 13.9 seconds (ref 11.4–15.2)

## 2019-01-26 MED ORDER — HYDROCODONE-ACETAMINOPHEN 5-325 MG PO TABS: 1.0000 | ORAL_TABLET | ORAL | Status: DC | PRN

## 2019-01-26 MED ORDER — FENTANYL CITRATE (PF) 100 MCG/2ML IJ SOLN
INTRAMUSCULAR | Status: AC | PRN
  Administered 2019-01-26 (×2): 50 ug via INTRAVENOUS

## 2019-01-26 MED ORDER — SODIUM CHLORIDE 0.9 % IV SOLN: INTRAVENOUS | Status: DC

## 2019-01-26 MED ORDER — FENTANYL CITRATE (PF) 100 MCG/2ML IJ SOLN
INTRAMUSCULAR | Status: AC
  Filled 2019-01-26: qty 2

## 2019-01-26 MED ORDER — MIDAZOLAM HCL 2 MG/2ML IJ SOLN
INTRAMUSCULAR | Status: AC | PRN
  Administered 2019-01-26 (×2): 1 mg via INTRAVENOUS

## 2019-01-26 MED ORDER — MIDAZOLAM HCL 2 MG/2ML IJ SOLN
INTRAMUSCULAR | Status: AC
  Filled 2019-01-26: qty 4

## 2019-01-26 NOTE — Consult Note (Addendum)
Chief Complaint: Patient was seen in consultation today for CT-guided left adrenal lesion biopsy  Referring Physician(s): Mohamed,Mohamed  Supervising Physician: Arne Cleveland  Patient Status: [facility] - Out-pt  History of Present Illness: Seth [name] is a 73 y.o. male with history of adenocarcinoma of the right lung diagnosed in February of this year, status post  chemoradiation. Recent PET scan on 01/22/2019 has revealed: 1. Multifocal hypermetabolic bone metastases are noted. Most of these lesions are without corresponding CT abnormality. However, there is a hypermetabolic lytic lesion noted within the posterior column of the left acetabulum. 2. Bilateral hypermetabolic adrenal metastasis. 3. Focal area of increased uptake within the body of the pancreas without corresponding CT abnormality is nonspecific. Cannot rule out metastatic disease to the pancreas however. 4. Changes of external beam radiation noted within the mid left lung. The primary lung lesion no longer measurable. 5. Aortic Atherosclerosis (ICD10-I70.0) and Emphysema (ICD10-J43.9). 6. Infrarenal abdominal aortic aneurysm measures 3.8 cm. Recommend followup by ultrasound in 2 years. This recommendation follows ACR consensus guidelines: White Paper of the ACR Incidental Findings Committee II on Vascular Findings. J Am Coll Radiol 2013; 10:789-794. Aortic aneurysm NOS (ICD10-I71.9).  He presents today for CT-guided left adrenal lesion biopsy for further evaluation. Past Medical History:  Diagnosis Date  . AAA (abdominal aortic aneurysm) ([place])   . Allergic rhinitis   . Anxiety   . Back pain   . CAD (coronary artery disease)   . Cataracts, bilateral   . Emphysema, unspecified ([place])     " moderate"  . Essential hypertension, benign   . GERD (gastroesophageal reflux disease)   . Gout   . Headache    migraines  . Heart murmur    as a child only  . Hilar adenopathy   . History of hiatal hernia   .  History of sciatica   . Hyperlipidemia   . lung ca dx'd 02/2018  . Lung nodule   . Pneumonia    as child  . Prostate hypertrophy   . PVD (peripheral vascular disease) ([place])   . Seasonal allergies   . Subclavian steal syndrome of left subclavian artery   . Wears dentures   . Wears dentures     Past Surgical History:  Procedure Laterality Date  . CARDIAC CATHETERIZATION     stent placement  . COLONOSCOPY W/ BIOPSIES AND POLYPECTOMY    . ILIAC ARTERY STENT    . MULTIPLE TOOTH EXTRACTIONS    . VIDEO BRONCHOSCOPY WITH ENDOBRONCHIAL NAVIGATION N/A 02/26/2018   Procedure: VIDEO BRONCHOSCOPY WITH ENDOBRONCHIAL NAVIGATION;  Surgeon: Garner Nash, DO;  Location: [place];  Service: Thoracic;  Laterality: N/A;  . VIDEO BRONCHOSCOPY WITH ENDOBRONCHIAL NAVIGATION N/A 03/19/2018   Procedure: VIDEO BRONCHOSCOPY WITH ENDOBRONCHIAL NAVIGATION and endobronchial ultrasound;  Surgeon: Garner Nash, DO;  Location: [place];  Service: Thoracic;  Laterality: N/A;  . VIDEO BRONCHOSCOPY WITH ENDOBRONCHIAL ULTRASOUND N/A 02/26/2018   Procedure: VIDEO BRONCHOSCOPY WITH ENDOBRONCHIAL ULTRASOUND;  Surgeon: Garner Nash, DO;  Location: [place];  Service: Thoracic;  Laterality: N/A;  . VIDEO BRONCHOSCOPY WITH ENDOBRONCHIAL ULTRASOUND N/A 03/19/2018   Procedure: NAVIGATION BRONCHOSCOPY;  Surgeon: Garner Nash, DO;  Location: [facility];  Service: Thoracic;  Laterality: N/A;    Allergies: Biaxin [clarithromycin], Clarithromycin, Iodinated diagnostic agents, Prednisone, Cortisone, Flomax [tamsulosin hcl], Hepatitis b virus vaccines, and Iodine  Medications: Prior to Admission medications   Medication Sig Start Date End Date Taking? Authorizing Provider  atorvastatin (LIPITOR) 40 MG tablet Take  1 tablet (40 mg total) by mouth daily. 04/09/18 04/09/19 Yes Turner, Eber Hong, MD  cetirizine (ZYRTEC) 10 MG tablet Take 10 mg by mouth daily at 12 noon.   Yes [provider]  famotidine (PEPCID) 20 MG tablet Take 20  mg by mouth 2 (two) times daily. Noon & in the evening.   Yes [provider]  furosemide (LASIX) 20 MG tablet Take 1 tablet (20 mg total) by mouth daily. 01/19/19  Yes [name], MD  lisinopril (ZESTRIL) 10 MG tablet Take 1 tablet (10 mg total) by mouth daily. 09/18/18  Yes [name], MD  metoprolol tartrate (LOPRESSOR) 50 MG tablet TAKE 1 AND 1/2 TABLETS BY MOUTH TWICE A DAY 12/15/18  Yes [name], MD  potassium chloride (KLOR-CON) 10 MEQ tablet Take 1 tablet (10 mEq total) by mouth daily. 01/19/19  Yes [name], MD  aspirin EC 81 MG tablet Take 81 mg by mouth daily as needed for mild pain.    [provider]  clopidogrel (PLAVIX) 75 MG tablet TAKE 1 TABLET BY MOUTH EVERY DAY 12/21/18   [name], MD  doxycycline (VIBRA-TABS) 100 MG tablet Take 1 tablet (100 mg total) by mouth 2 (two) times daily. 01/15/19   [name], MD  loperamide (IMODIUM) 2 MG capsule Take 4 mg by mouth as needed for diarrhea or loose stools.    [provider]     Family History  Problem Relation Age of Onset  . Hypertension Mother   . Heart disease Mother   . Stroke Mother   . Heart disease Paternal Grandmother   . Diabetes Paternal Grandmother   . Hyperlipidemia Paternal Grandmother   . Diabetes Sister   . Heart disease Sister   . Hyperlipidemia Sister   . Heart disease Brother   . Hyperlipidemia Brother   . Hypertension Brother   . Diabetes Paternal Grandfather   . Heart disease Paternal Grandfather   . Hyperlipidemia Paternal Grandfather   . Heart disease Maternal Uncle        x 2  . Cancer Maternal Grandmother        type unknown, ? lung  . Leukemia Maternal Uncle     Social History   Socioeconomic History  . Marital status: Married    Spouse name: Not on file  . Number of children: 1  . Years of education: Not on file  . Highest education level: Not on file  Occupational History  . Occupation: retired  Tobacco Use  .  Smoking status: Former Smoker    Packs/day: 3.00    Years: 50.00    Pack years: 150.00    Types: Cigarettes    Quit date: 04/05/2016    Years since quitting: 2.8  . Smokeless tobacco: Never Used  Substance and Sexual Activity  . Alcohol use: Yes    Alcohol/week: 0.0 standard drinks    Comment: rare  . Drug use: No  . Sexual activity: Not on file  Other Topics Concern  . Not on file  Social History Narrative   Married   Exercise: No   Education: GED   Social Determinants of Radio broadcast assistant Strain:   . Difficulty of Paying Living Expenses: Not on file  Food Insecurity:   . Worried About Charity fundraiser in the Last Year: Not on file  . Ran Out of Food in the Last Year: Not on file  Transportation Needs:   .  Lack of Transportation (Medical): Not on file  . Lack of Transportation (Non-Medical): Not on file  Physical Activity:   . Days of Exercise per Week: Not on file  . Minutes of Exercise per Session: Not on file  Stress:   . Feeling of Stress : Not on file  Social Connections:   . Frequency of Communication with Friends and Family: Not on file  . Frequency of Social Gatherings with Friends and Family: Not on file  . Attends Religious Services: Not on file  . Active Member of Clubs or Organizations: Not on file  . Attends Archivist Meetings: Not on file  . Marital Status: Not on file      Review of Systems currently denies fever, headache, chest pain, dyspnea, cough, abdominal pain, nausea, vomiting or bleeding.  He does have back pain.  He is also hard of hearing. Also with easy bruising.  Vital Signs: Blood pressure 117/62, heart rate 75, respirations 16, temp 98.1, O2 sat 97% RA   Physical Exam awake, alert.  Chest with distant breath sounds bilaterally.  Heart with regular rate and rhythm.  Abdomen soft, positive bowel sounds, nontender.  Lower extremity edema noted.  Left lower leg/foot in boot.  Imaging: CT Abdomen Pelvis Wo  Contrast  Result Date: 01/13/2019 CLINICAL DATA:  Non-small cell lung cancer, restaging EXAM: CT CHEST, ABDOMEN AND PELVIS WITHOUT CONTRAST TECHNIQUE: Multidetector CT imaging of the chest, abdomen and pelvis was performed following the standard protocol without IV contrast. COMPARISON:  CT chest dated 10/14/2018.  PET-CT dated 03/03/2018. FINDINGS: CT CHEST FINDINGS Cardiovascular: The heart is normal in size. Trace pericardial effusion. No evidence of thoracic aortic aneurysm. Atherosclerotic calcifications of the aortic arch. Three vessel coronary atherosclerosis. Mediastinum/Nodes: No suspicious mediastinal lymphadenopathy. Visualized thyroid is unremarkable. Lungs/Pleura: Moderate centrilobular and paraseptal emphysematous changes, upper lung predominant. Radiation changes in the left upper lobe/lingula and superior segment left lower lobe. Near complete resolution of prior underlying central lingular nodule, measuring approximately 7 mm (series 6/image 80). 3 mm subpleural nodule in the anterior right lower lobe (series 6/image 114), unchanged. No new/suspicious pulmonary nodules. No focal consolidation. No pleural effusion or pneumothorax. Musculoskeletal: Visualized osseous structures are within normal limits. CT ABDOMEN PELVIS FINDINGS Hepatobiliary: Unenhanced liver is unremarkable. Gallbladder is unremarkable. No intrahepatic or extrahepatic ductal dilatation. Pancreas: Within normal limits. Spleen: Within normal limits. Adrenals/Urinary Tract: 19 mm left adrenal nodule, previously 15 mm. Two right adrenal nodules measuring 23 and 18 mm, previously 16 and 11 mm when measured in a similar fashion. These are new from prior PET and are compatible with metastases. Kidneys are within normal limits, noting bilateral renal vascular calcifications. No hydronephrosis. Bladder is within normal limits. Stomach/Bowel: Stomach is within normal limits. No evidence of bowel obstruction. Normal appendix (series 2/image  95). Vascular/Lymphatic: 3.9 x 4.5 cm infrarenal abdominal aortic aneurysm, previously 4.0 x 4.6 cm on prior PET, unchanged. Atherosclerotic calcifications of the abdominal aorta and branch vessels. Bilateral common iliac artery stents. No suspicious abdominopelvic lymphadenopathy. Reproductive: Prostatomegaly. Other: No abdominopelvic ascites. Musculoskeletal: Grade 2 spondylolisthesis at L5-S1. Mild degenerative changes of the lower lumbar spine. IMPRESSION: Radiation changes in the left hemithorax, as above. Near complete resolution of prior lingular nodule, now measuring 7 mm. Progressive bilateral adrenal metastases, measuring up to 2.3 cm on the right. 3.5 x 4.5 cm infrarenal abdominal aortic aneurysm, grossly unchanged. In this patient, attention on follow-up is likely satisfactory. J Am Coll Radiol 2013; 10:789-794. Aortic aneurysm NOS (ICD10-I71.9) Additional stable  ancillary findings as above. Aortic Atherosclerosis (ICD10-I70.0) and Emphysema (ICD10-J43.9). Electronically Signed   By: Julian Hy M.D.   On: 01/13/2019 14:02   CT Chest Wo Contrast  Result Date: 01/13/2019 CLINICAL DATA:  Non-small cell lung cancer, restaging EXAM: CT CHEST, ABDOMEN AND PELVIS WITHOUT CONTRAST TECHNIQUE: Multidetector CT imaging of the chest, abdomen and pelvis was performed following the standard protocol without IV contrast. COMPARISON:  CT chest dated 10/14/2018.  PET-CT dated 03/03/2018. FINDINGS: CT CHEST FINDINGS Cardiovascular: The heart is normal in size. Trace pericardial effusion. No evidence of thoracic aortic aneurysm. Atherosclerotic calcifications of the aortic arch. Three vessel coronary atherosclerosis. Mediastinum/Nodes: No suspicious mediastinal lymphadenopathy. Visualized thyroid is unremarkable. Lungs/Pleura: Moderate centrilobular and paraseptal emphysematous changes, upper lung predominant. Radiation changes in the left upper lobe/lingula and superior segment left lower lobe. Near complete  resolution of prior underlying central lingular nodule, measuring approximately 7 mm (series 6/image 80). 3 mm subpleural nodule in the anterior right lower lobe (series 6/image 114), unchanged. No new/suspicious pulmonary nodules. No focal consolidation. No pleural effusion or pneumothorax. Musculoskeletal: Visualized osseous structures are within normal limits. CT ABDOMEN PELVIS FINDINGS Hepatobiliary: Unenhanced liver is unremarkable. Gallbladder is unremarkable. No intrahepatic or extrahepatic ductal dilatation. Pancreas: Within normal limits. Spleen: Within normal limits. Adrenals/Urinary Tract: 19 mm left adrenal nodule, previously 15 mm. Two right adrenal nodules measuring 23 and 18 mm, previously 16 and 11 mm when measured in a similar fashion. These are new from prior PET and are compatible with metastases. Kidneys are within normal limits, noting bilateral renal vascular calcifications. No hydronephrosis. Bladder is within normal limits. Stomach/Bowel: Stomach is within normal limits. No evidence of bowel obstruction. Normal appendix (series 2/image 95). Vascular/Lymphatic: 3.9 x 4.5 cm infrarenal abdominal aortic aneurysm, previously 4.0 x 4.6 cm on prior PET, unchanged. Atherosclerotic calcifications of the abdominal aorta and branch vessels. Bilateral common iliac artery stents. No suspicious abdominopelvic lymphadenopathy. Reproductive: Prostatomegaly. Other: No abdominopelvic ascites. Musculoskeletal: Grade 2 spondylolisthesis at L5-S1. Mild degenerative changes of the lower lumbar spine. IMPRESSION: Radiation changes in the left hemithorax, as above. Near complete resolution of prior lingular nodule, now measuring 7 mm. Progressive bilateral adrenal metastases, measuring up to 2.3 cm on the right. 3.5 x 4.5 cm infrarenal abdominal aortic aneurysm, grossly unchanged. In this patient, attention on follow-up is likely satisfactory. J Am Coll Radiol 2013; 10:789-794. Aortic aneurysm NOS (ICD10-I71.9)  Additional stable ancillary findings as above. Aortic Atherosclerosis (ICD10-I70.0) and Emphysema (ICD10-J43.9). Electronically Signed   By: Julian Hy M.D.   On: 01/13/2019 14:02   NM PET Image Restag (PS) Skull Base To Thigh  Result Date: 01/22/2019 CLINICAL DATA:  Subsequent treatment strategy for lung cancer. EXAM: NUCLEAR MEDICINE PET SKULL BASE TO THIGH TECHNIQUE: 10.5 mCi F-18 FDG was injected intravenously. Full-ring PET imaging was performed from the skull base to thigh after the radiotracer. CT data was obtained and used for attenuation correction and anatomic localization. Fasting blood glucose: 114 mg/dl COMPARISON:  03/03/2018 FINDINGS: Mediastinal blood pool activity: SUV max 2.12 Liver activity: SUV max NA NECK: No hypermetabolic lymph nodes in the neck. Incidental CT findings: none CHEST: Post treatment changes including volume loss, fibrosis and architectural distortion noted within the left midlung. Findings likely reflect changes secondary to external beam radiation identified. The lingular nodule is no longer measurable. No suspicious nodule or mass identified within the lungs at this time. No hypermetabolic lymph nodes identified within the chest. Incidental CT findings: Moderate changes of centrilobular and paraseptal emphysema. Lad and  left circumflex coronary artery calcifications. ABDOMEN/PELVIS: No abnormal FDG uptake identified within the liver, or spleen. New bilateral FDG avid adrenal nodules are identified compared with 03/03/2018. Right adrenal nodule measures 2.8 cm and has an SUV max of 8.84. Left adrenal nodule measures 2.1 cm and has an SUV max of 6.02. Small to medium size focus of mild uptake within the body of pancreas has an SUV max of 3.65. No corresponding mass identified. No hypermetabolic lymph nodes. Incidental CT findings: Infrarenal abdominal aortic aneurysm measures 3.8 cm, image 140/4. Aortic atherosclerosis. SKELETON: Multifocal FDG avid bone metastases  are new from previous PET-CT. Index lytic lesion within the posterior column of left acetabulum measures 2.2 cm and has an SUV max of 9.12. Index FDG avid lesion within the C4 vertebra has an SUV max of 5.5. No corresponding CT abnormality noted. Index hypermetabolic lesion involving the T9 vertebra without corresponding CT abnormality noted. This has an SUV max of 9.59. Index hypermetabolic lesion within the L2 lesion without CT abnormality has an SUV max of 12.26. Incidental CT findings: none IMPRESSION: 1. Multifocal hypermetabolic bone metastases are noted. Most of these lesions are without corresponding CT abnormality. However, there is a hypermetabolic lytic lesion noted within the posterior column of the left acetabulum. 2. Bilateral hypermetabolic adrenal metastasis. 3. Focal area of increased uptake within the body of the pancreas without corresponding CT abnormality is nonspecific. Cannot rule out metastatic disease to the pancreas however. 4. Changes of external beam radiation noted within the mid left lung. The primary lung lesion no longer measurable. 5. Aortic Atherosclerosis (ICD10-I70.0) and Emphysema (ICD10-J43.9). 6. Infrarenal abdominal aortic aneurysm measures 3.8 cm. Recommend followup by ultrasound in 2 years. This recommendation follows ACR consensus guidelines: White Paper of the ACR Incidental Findings Committee II on Vascular Findings. J Am Coll Radiol 2013; 10:789-794. Aortic aneurysm NOS (ICD10-I71.9). Electronically Signed   By: Kerby Moors M.D.   On: 01/22/2019 10:00    Labs:  CBC: Recent Labs    06/16/18 1038 07/11/18 1210 10/14/18 0753 01/13/19 1012  WBC 3.1* 4.0 6.7 6.5  HGB 11.7* 10.8* 14.3 13.3  HCT 34.3* 32.6* 42.9 40.5  PLT 113* 195 185 192    COAGS: Recent Labs    02/25/18 0829 03/19/18 1059  INR 1.03 1.00  APTT 29 29    BMP: Recent Labs    07/11/18 1210 10/14/18 0753 01/13/19 1012 01/19/19 1457  NA 137 138 137 133*  K 4.3 3.9 4.0 4.7  CL  105 106 101 95*  CO2 24 21* 25 20  GLUCOSE 110* 120* 111* 81  BUN 10 8 6* 9  CALCIUM 8.7* 8.7* 9.0 9.4  CREATININE 1.15 1.18 1.09 1.02  GFRNONAA >60 >60 >60 73  GFRAA >60 >60 >60 84    LIVER FUNCTION TESTS: Recent Labs    06/16/18 1038 07/11/18 1210 08/18/18 0903 10/14/18 0753 01/13/19 1012  BILITOT 0.9 0.6  --  0.4 0.6  AST 13* 13*  --  12* 10*  ALT 19 14 12 9 7   ALKPHOS 116 132*  --  116 129*  PROT 6.5 6.3*  --  6.4* 6.8  ALBUMIN 3.8 3.5  --  3.8 3.8    TUMOR MARKERS: No results for input(s): AFPTM, CEA, CA199, CHROMGRNA in the last 8760 hours.  Assessment and Plan: 73 y.o. male with history of adenocarcinoma of the right lung diagnosed in February of this year, status post  chemoradiation. Recent PET scan on 01/22/2019 has revealed: 1. Multifocal hypermetabolic  bone metastases are noted. Most of these lesions are without corresponding CT abnormality. However, there is a hypermetabolic lytic lesion noted within the posterior column of the left acetabulum. 2. Bilateral hypermetabolic adrenal metastasis. 3. Focal area of increased uptake within the body of the pancreas without corresponding CT abnormality is nonspecific. Cannot rule out metastatic disease to the pancreas however. 4. Changes of external beam radiation noted within the mid left lung. The primary lung lesion no longer measurable. 5. Aortic Atherosclerosis (ICD10-I70.0) and Emphysema (ICD10-J43.9). 6. Infrarenal abdominal aortic aneurysm measures 3.8 cm. Recommend followup by ultrasound in 2 years. This recommendation follows ACR consensus guidelines: White Paper of the ACR Incidental Findings Committee II on Vascular Findings. J Am Coll Radiol 2013; 10:789-794. Aortic aneurysm NOS (ICD10-I71.9).  He presents today for CT-guided left adrenal lesion biopsy for further evaluation.Risks and benefits of procedure was discussed with the patient  including, but not limited to bleeding, infection, damage to  adjacent structures or low yield requiring additional tests.  All of the questions were answered and there is agreement to proceed.  Consent signed and in chart.     Thank you for this interesting consult.  I greatly enjoyed meeting Cain Fitzhenry Sanderford and look forward to participating in their care.  A copy of this report was sent to the requesting provider on this date.  Electronically Signed: D. Rowe Robert, PA-C 01/26/2019, 8:18 AM   I spent a total of  25 minutes   in face to face in clinical consultation, greater than 50% of which was counseling/coordinating care for CT-guided left adrenal lesion biopsy

## 2019-01-26 NOTE — Procedures (Signed)
  Procedure: CT core biopsy L adrenal lesion   EBL:   minimal Complications:  none immediate  See full dictation in [facility].  Dillard Cannon MD Main # [phone] Pager  [phone]

## 2019-01-26 NOTE — Discharge Instructions (Signed)
Please call Interventional Radiology clinic [phone] with any questions or concerns regarding your biopsy site.  You may remove your dressing and shower tomorrow.  Moderate Conscious Sedation, Adult, Care After These instructions provide you with information about caring for yourself after your procedure. Your health care provider may also give you more specific instructions. Your treatment has been planned according to current medical practices, but problems sometimes occur. Call your health care provider if you have any problems or questions after your procedure. What can I expect after the procedure? After your procedure, it is common:  To feel sleepy for several hours.  To feel clumsy and have poor balance for several hours.  To have poor judgment for several hours.  To vomit if you eat too soon. Follow these instructions at home: For at least 24 hours after the procedure:   Do not: ? Participate in activities where you could fall or become injured. ? Drive. ? Use heavy machinery. ? Drink alcohol. ? Take sleeping pills or medicines that cause drowsiness. ? Make important decisions or sign legal documents. ? Take care of children on your own.  Rest. Eating and drinking  Follow the diet recommended by your health care provider.  If you vomit: ? Drink water, juice, or soup when you can drink without vomiting. ? Make sure you have little or no nausea before eating solid foods. General instructions  Have a responsible adult stay with you until you are awake and alert.  Take over-the-counter and prescription medicines only as told by your health care provider.  If you smoke, do not smoke without supervision.  Keep all follow-up visits as told by your health care provider. This is important. Contact a health care provider if:  You keep feeling nauseous or you keep vomiting.  You feel light-headed.  You develop a rash.  You have a fever. Get help right away  if:  You have trouble breathing. This information is not intended to replace advice given to you by your health care provider. Make sure you discuss any questions you have with your health care provider. Document Released: 11/12/2012 Document Revised: 01/04/2017 Document Reviewed: 05/14/2015 Elsevier Patient Education  2020 [employer].   Needle Biopsy, Care After These instructions tell you how to care for yourself after your procedure. Your doctor may also give you more specific instructions. Call your doctor if you have any problems or questions. What can I expect after the procedure? After the procedure, it is common to have:  Soreness.  Bruising.  Mild pain. Follow these instructions at home:   Return to your normal activities as told by your doctor. Ask your doctor what activities are safe for you.  Take over-the-counter and prescription medicines only as told by your doctor.  Wash your hands with soap and water before you change your bandage (dressing). If you cannot use soap and water, use hand sanitizer.  Follow instructions from your doctor about: ? How to take care of your puncture site. ? When and how to change your bandage. ? When to remove your bandage.  Check your puncture site every day for signs of infection. Watch for: ? Redness, swelling, or pain. ? Fluid or blood. ? Pus or a bad smell. ? Warmth.  Do not take baths, swim, or use a hot tub until your doctor approves. Ask your doctor if you may take showers. You may only be allowed to take sponge baths.  Keep all follow-up visits as told by your doctor. This  is important. Contact a doctor if you have:  A fever.  Redness, swelling, or pain at the puncture site, and it lasts longer than a few days.  Fluid, blood, or pus coming from the puncture site.  Warmth coming from the puncture site. Get help right away if:  You have a lot of bleeding from the puncture site. Summary  After the procedure,  it is common to have soreness, bruising, or mild pain at the puncture site.  Check your puncture site every day for signs of infection, such as redness, swelling, or pain.  Get help right away if you have severe bleeding from your puncture site. This information is not intended to replace advice given to you by your health care provider. Make sure you discuss any questions you have with your health care provider. Document Released: 01/05/2008 Document Revised: 02/04/2017 Document Reviewed: 02/04/2017 Elsevier Patient Education  2020 [employer].

## 2019-01-27 ENCOUNTER — Telehealth (INDEPENDENT_AMBULATORY_CARE_PROVIDER_SITE_OTHER): Payer: Medicare Other | Admitting: Family Medicine

## 2019-01-27 DIAGNOSIS — I739 Peripheral vascular disease, unspecified: Secondary | ICD-10-CM

## 2019-01-27 DIAGNOSIS — R600 Localized edema: Secondary | ICD-10-CM | POA: Diagnosis not present

## 2019-01-27 DIAGNOSIS — L602 Onychogryphosis: Secondary | ICD-10-CM | POA: Diagnosis not present

## 2019-01-27 LAB — SURGICAL PATHOLOGY

## 2019-01-27 NOTE — Progress Notes (Signed)
Pt is having swelling and pain in the leg and feet. He fell and broke an ankle 2 months ago, the pain is generating from that. He has been wearing compression and doxycycline for pain and swelling. Medication and pharmacy have been verified.

## 2019-01-27 NOTE — Progress Notes (Signed)
Virtual Visit Note  I connected with patient on 01/27/19 at 528pm by phone and verified that I am speaking with the correct person using two identifiers. Seth [name] is currently located at home and patient is currently with them during visit. The provider, Rutherford Guys, MD is located in their office at time of visit.  I discussed the limitations, risks, security and privacy concerns of performing an evaluation and management service by telephone and the availability of in person appointments. I also discussed with the patient that there may be a patient responsible charge related to this service. The patient expressed understanding and agreed to proceed.   CC: edema  HPI ? Patient seen last week by Dr Carlota Raspberry, his PCP rx doxycycline and lasix, compression stockings Left leg has come down a lot, right not so much  Right leg was actually doing much better until he had BBQ Blisters healing, no weeping Erythema getting better, now only along right ankle and foot Waking up with less edema Having no pain No pitting edema anymore Has h/o severe right ankle sprain unremarkable echo march 2020 Saw vasc surg in aug 2020 for LLE edema Has cut back on salt, he is elevating  Requesting referral back to podiatrist for toenail care  Allergies  Allergen Reactions  . Biaxin [Clarithromycin] Shortness Of Breath  . Clarithromycin Shortness Of Breath  . Iodinated Diagnostic Agents Hives  . Prednisone Other (See Comments)    Patient states he turns REALLY RED all over.  . Cortisone Other (See Comments)    Turns red from breast up  . Flomax [Tamsulosin Hcl]     Blurred vision and lower back pain   . Hepatitis B Virus Vaccines     Nerve problems States his previous doctor told him it was due to this   . Iodine Hives    Dye from nuclear medicine test    Prior to Admission medications   Medication Sig Start Date End Date Taking? Authorizing Provider  atorvastatin (LIPITOR) 40 MG tablet  Take 1 tablet (40 mg total) by mouth daily. 04/09/18 04/09/19 Yes Turner, Eber Hong, MD  cetirizine (ZYRTEC) 10 MG tablet Take 10 mg by mouth daily at 12 noon.   Yes [provider]  clopidogrel (PLAVIX) 75 MG tablet TAKE 1 TABLET BY MOUTH EVERY DAY 12/21/18  Yes [name], MD  famotidine (PEPCID) 20 MG tablet Take 20 mg by mouth 2 (two) times daily. Noon & in the evening.   Yes [provider]  lisinopril (ZESTRIL) 10 MG tablet Take 1 tablet (10 mg total) by mouth daily. 09/18/18  Yes [name], MD  furosemide (LASIX) 20 MG tablet Take 1 tablet (20 mg total) by mouth daily. Patient not taking: Reported on 01/27/2019 01/19/19   [name], MD    Past Medical History:  Diagnosis Date  . AAA (abdominal aortic aneurysm) ([place])   . Allergic rhinitis   . Anxiety   . Back pain   . CAD (coronary artery disease)   . Cataracts, bilateral   . Emphysema, unspecified ([place])     " moderate"  . Essential hypertension, benign   . GERD (gastroesophageal reflux disease)   . Gout   . Headache    migraines  . Heart murmur    as a child only  . Hilar adenopathy   . History of hiatal hernia   . History of sciatica   . Hyperlipidemia   . lung ca dx'd 02/2018  .  Lung nodule   . Pneumonia    as child  . Prostate hypertrophy   . PVD (peripheral vascular disease) ([place])   . Seasonal allergies   . Subclavian steal syndrome of left subclavian artery   . Wears dentures   . Wears dentures     Past Surgical History:  Procedure Laterality Date  . CARDIAC CATHETERIZATION     stent placement  . COLONOSCOPY W/ BIOPSIES AND POLYPECTOMY    . ILIAC ARTERY STENT    . MULTIPLE TOOTH EXTRACTIONS    . VIDEO BRONCHOSCOPY WITH ENDOBRONCHIAL NAVIGATION N/A 02/26/2018   Procedure: VIDEO BRONCHOSCOPY WITH ENDOBRONCHIAL NAVIGATION;  Surgeon: Garner Nash, DO;  Location: [facility];  Service: Thoracic;  Laterality: N/A;  . VIDEO BRONCHOSCOPY WITH ENDOBRONCHIAL NAVIGATION N/A 03/19/2018     Procedure: VIDEO BRONCHOSCOPY WITH ENDOBRONCHIAL NAVIGATION and endobronchial ultrasound;  Surgeon: Garner Nash, DO;  Location: [facility];  Service: Thoracic;  Laterality: N/A;  . VIDEO BRONCHOSCOPY WITH ENDOBRONCHIAL ULTRASOUND N/A 02/26/2018   Procedure: VIDEO BRONCHOSCOPY WITH ENDOBRONCHIAL ULTRASOUND;  Surgeon: Garner Nash, DO;  Location: [facility];  Service: Thoracic;  Laterality: N/A;  . VIDEO BRONCHOSCOPY WITH ENDOBRONCHIAL ULTRASOUND N/A 03/19/2018   Procedure: NAVIGATION BRONCHOSCOPY;  Surgeon: Garner Nash, DO;  Location: [facility];  Service: Thoracic;  Laterality: N/A;    Social History   Tobacco Use  . Smoking status: Former Smoker    Packs/day: 3.00    Years: 50.00    Pack years: 150.00    Types: Cigarettes    Quit date: 04/05/2016    Years since quitting: 2.8  . Smokeless tobacco: Never Used  Substance Use Topics  . Alcohol use: Yes    Alcohol/week: 0.0 standard drinks    Comment: rare    Family History  Problem Relation Age of Onset  . Hypertension Mother   . Heart disease Mother   . Stroke Mother   . Heart disease Paternal Grandmother   . Diabetes Paternal Grandmother   . Hyperlipidemia Paternal Grandmother   . Diabetes Sister   . Heart disease Sister   . Hyperlipidemia Sister   . Heart disease Brother   . Hyperlipidemia Brother   . Hypertension Brother   . Diabetes Paternal Grandfather   . Heart disease Paternal Grandfather   . Hyperlipidemia Paternal Grandfather   . Heart disease Maternal Uncle        x 2  . Cancer Maternal Grandmother        type unknown, ? lung  . Leukemia Maternal Uncle     ROS Per hpi  Objective  Vitals as reported by the patient: none   ASSESSMENT and PLAN  1. Pedal edema Improved, cont with compression stockings, elevation and low salt diet. RTC precautions given  2. Overgrown toenails 3. PVD (peripheral vascular disease) [facility]) - Ambulatory referral to Podiatry  FOLLOW-UP: Feb 2020 with Dr Carlota Raspberry   The above  assessment and management plan was discussed with the patient. The patient verbalized understanding of and has agreed to the management plan. Patient is aware to call the clinic if symptoms persist or worsen. Patient is aware when to return to the clinic for a follow-up visit. Patient educated on when it is appropriate to go to the emergency department.    I provided 17 minutes of non-face-to-face time during this encounter.  Rutherford Guys, MD Primary [facility] at [facility] [place], [place] [address] Ph.  [phone] Fax [phone]

## 2019-02-03 ENCOUNTER — Inpatient Hospital Stay: Payer: Medicare Other

## 2019-02-03 ENCOUNTER — Encounter: Payer: Self-pay | Admitting: *Deleted

## 2019-02-03 ENCOUNTER — Inpatient Hospital Stay (HOSPITAL_BASED_OUTPATIENT_CLINIC_OR_DEPARTMENT_OTHER): Payer: Medicare Other | Admitting: Internal Medicine

## 2019-02-03 ENCOUNTER — Encounter: Payer: Self-pay | Admitting: Internal Medicine

## 2019-02-03 ENCOUNTER — Other Ambulatory Visit: Payer: Self-pay

## 2019-02-03 VITALS — BP 121/62 | HR 79 | Temp 98.1°F | Resp 18 | Ht 73.0 in | Wt 209.5 lb

## 2019-02-03 DIAGNOSIS — I2510 Atherosclerotic heart disease of native coronary artery without angina pectoris: Secondary | ICD-10-CM

## 2019-02-03 DIAGNOSIS — C3411 Malignant neoplasm of upper lobe, right bronchus or lung: Secondary | ICD-10-CM | POA: Diagnosis not present

## 2019-02-03 DIAGNOSIS — Z5112 Encounter for antineoplastic immunotherapy: Secondary | ICD-10-CM

## 2019-02-03 DIAGNOSIS — C3490 Malignant neoplasm of unspecified part of unspecified bronchus or lung: Secondary | ICD-10-CM | POA: Diagnosis not present

## 2019-02-03 DIAGNOSIS — C3492 Malignant neoplasm of unspecified part of left bronchus or lung: Secondary | ICD-10-CM | POA: Insufficient documentation

## 2019-02-03 DIAGNOSIS — R5382 Chronic fatigue, unspecified: Secondary | ICD-10-CM

## 2019-02-03 DIAGNOSIS — Z7189 Other specified counseling: Secondary | ICD-10-CM

## 2019-02-03 DIAGNOSIS — Z5111 Encounter for antineoplastic chemotherapy: Secondary | ICD-10-CM

## 2019-02-03 MED ORDER — FOLIC ACID 1 MG PO TABS: 1.0000 mg | ORAL_TABLET | Freq: Every day | ORAL | 4 refills | Status: DC

## 2019-02-03 MED ORDER — CYANOCOBALAMIN 1000 MCG/ML IJ SOLN
1000.0000 ug | Freq: Once | INTRAMUSCULAR | Status: AC
  Administered 2019-02-03: 16:00:00 1000 ug via INTRAMUSCULAR

## 2019-02-03 MED ORDER — CYANOCOBALAMIN 1000 MCG/ML IJ SOLN
INTRAMUSCULAR | Status: AC
  Filled 2019-02-03: qty 1

## 2019-02-03 MED ORDER — PROCHLORPERAZINE MALEATE 10 MG PO TABS: 10.0000 mg | ORAL_TABLET | Freq: Four times a day (QID) | ORAL | 0 refills | Status: AC | PRN

## 2019-02-03 NOTE — Progress Notes (Signed)
Oncology Nurse Navigator Documentation  Oncology Nurse Navigator Flowsheets 02/03/2019  Abnormal Finding Date -  Confirmed Diagnosis Date -  Navigation Complete Date: -  Reason Not Navigating Patient: -  Navigator Location [facility]  Referral Date to RadOnc/MedOnc -  Navigator Encounter Type Other/per Dr. Julien Nordmann, [employer] and PDL 1 testing requested on recent path case number [place].  Telephone -  [facility] Date -  Multidisiplinary Clinic Type -  Treatment Initiated Date -  Patient Visit Type -  Treatment Phase Abnormal Scans  Barriers/Navigation Needs Coordination of Care  Education -  Interventions Coordination of Care  Acuity Level 2-Minimal Needs (1-2 Barriers Identified)  Coordination of Care Other  Education Method -  Time Spent with Patient 30

## 2019-02-03 NOTE — Progress Notes (Signed)
[place] [phone]   [phone]  OFFICE PROGRESS NOTE  [name], MD [place] [address]  DIAGNOSIS: Metastatic non-small cell lung cancer, adenocarcinoma initially diagnosed as stage IIB (T1c, N1, M0) non-small cell lung cancer favoring adenocarcinoma presented with right upper lobe lung nodule in addition to right hilar lymphadenopathy diagnosed in February 2020.  The patient is not a good surgical candidate for resection.  He had evidence for disease recurrence in December 2020 with adrenal gland metastasis.  PRIOR THERAPY: Concurrent chemoradiation with weekly carboplatin for AUC of 2 and paclitaxel 45 mg/M2.  First dose May 05, 2018.  Status post 7 cycles.  Last dose was given Jun 16, 2018  CURRENT THERAPY: Systemic chemotherapy with carboplatin for AUC of 5, Alimta 500 mg/M2 and Keytruda 200 mg IV every 3 weeks.  First dose starting February 17, 2019 if the molecular studies are negative for actionable mutation.  INTERVAL HISTORY: Seth [name] 73 y.o. male returns to the clinic today for follow-up visit.  The patient is feeling fine today with no concerning complaints except for mild fatigue.  He denied having any chest pain, shortness of breath, cough or hemoptysis.  He denied having any fever or chills.  He has no nausea, vomiting, diarrhea or constipation.  He has no headache or visual changes.  The patient has no recent weight loss or night sweats.  He underwent CT-guided core biopsy of the left adrenal lesion by interventional neurology and the final pathology was consistent with metastatic adenocarcinoma of lung primary.  He is here today for evaluation and discussion of his treatment options.  MEDICAL HISTORY: Past Medical History:  Diagnosis Date  . AAA (abdominal aortic aneurysm) ([place])   . Allergic rhinitis   . Anxiety   . Back pain   . CAD (coronary artery disease)   . Cataracts, bilateral   .  Emphysema, unspecified ([place])     " moderate"  . Essential hypertension, benign   . GERD (gastroesophageal reflux disease)   . Gout   . Headache    migraines  . Heart murmur    as a child only  . Hilar adenopathy   . History of hiatal hernia   . History of sciatica   . Hyperlipidemia   . lung ca dx'd 02/2018  . Lung nodule   . Pneumonia    as child  . Prostate hypertrophy   . PVD (peripheral vascular disease) ([place])   . Seasonal allergies   . Subclavian steal syndrome of left subclavian artery   . Wears dentures   . Wears dentures     ALLERGIES:  is allergic to biaxin [clarithromycin]; clarithromycin; iodinated diagnostic agents; prednisone; cortisone; flomax [tamsulosin hcl]; hepatitis b virus vaccines; and iodine.  MEDICATIONS:  Current Outpatient Medications  Medication Sig Dispense Refill  . atorvastatin (LIPITOR) 40 MG tablet Take 1 tablet (40 mg total) by mouth daily. 90 tablet 3  . cetirizine (ZYRTEC) 10 MG tablet Take 10 mg by mouth daily at 12 noon.    . clopidogrel (PLAVIX) 75 MG tablet TAKE 1 TABLET BY MOUTH EVERY DAY 90 tablet 0  . famotidine (PEPCID) 20 MG tablet Take 20 mg by mouth 2 (two) times daily. Noon & in the evening.    . furosemide (LASIX) 20 MG tablet Take 1 tablet (20 mg total) by mouth daily. (Patient not taking: Reported on 01/27/2019) 3 tablet 0  . lisinopril (ZESTRIL) 10 MG tablet  Take 1 tablet (10 mg total) by mouth daily. 90 tablet 1   Current Facility-Administered Medications  Medication Dose Route Frequency Provider Last Rate Last Admin  . 0.9 %  sodium chloride infusion  500 mL Intravenous Once Irene Shipper, MD        SURGICAL HISTORY:  Past Surgical History:  Procedure Laterality Date  . CARDIAC CATHETERIZATION     stent placement  . COLONOSCOPY W/ BIOPSIES AND POLYPECTOMY    . ILIAC ARTERY STENT    . MULTIPLE TOOTH EXTRACTIONS    . VIDEO BRONCHOSCOPY WITH ENDOBRONCHIAL NAVIGATION N/A 02/26/2018   Procedure: VIDEO BRONCHOSCOPY WITH  ENDOBRONCHIAL NAVIGATION;  Surgeon: Garner Nash, DO;  Location: [facility];  Service: Thoracic;  Laterality: N/A;  . VIDEO BRONCHOSCOPY WITH ENDOBRONCHIAL NAVIGATION N/A 03/19/2018   Procedure: VIDEO BRONCHOSCOPY WITH ENDOBRONCHIAL NAVIGATION and endobronchial ultrasound;  Surgeon: Garner Nash, DO;  Location: [facility];  Service: Thoracic;  Laterality: N/A;  . VIDEO BRONCHOSCOPY WITH ENDOBRONCHIAL ULTRASOUND N/A 02/26/2018   Procedure: VIDEO BRONCHOSCOPY WITH ENDOBRONCHIAL ULTRASOUND;  Surgeon: Garner Nash, DO;  Location: [facility];  Service: Thoracic;  Laterality: N/A;  . VIDEO BRONCHOSCOPY WITH ENDOBRONCHIAL ULTRASOUND N/A 03/19/2018   Procedure: NAVIGATION BRONCHOSCOPY;  Surgeon: Garner Nash, DO;  Location: [facility];  Service: Thoracic;  Laterality: N/A;    REVIEW OF SYSTEMS:  Constitutional: positive for fatigue Eyes: negative Ears, nose, mouth, throat, and face: negative Respiratory: negative Cardiovascular: negative Gastrointestinal: negative Genitourinary:negative Integument/breast: negative Hematologic/lymphatic: negative Musculoskeletal:negative Neurological: negative Behavioral/Psych: negative Endocrine: negative Allergic/Immunologic: negative   PHYSICAL EXAMINATION: General appearance: alert, cooperative, fatigued and no distress Head: Normocephalic, without obvious abnormality, atraumatic Neck: no adenopathy, no JVD, supple, symmetrical, trachea midline and thyroid not enlarged, symmetric, no tenderness/mass/nodules Lymph nodes: Cervical, supraclavicular, and axillary nodes normal. Resp: clear to auscultation bilaterally Back: symmetric, no curvature. ROM normal. No CVA tenderness. Cardio: regular rate and rhythm, S1, S2 normal, no murmur, click, rub or gallop GI: soft, non-tender; bowel sounds normal; no masses,  no organomegaly Extremities: extremities normal, atraumatic, no cyanosis or edema Neurologic: Alert and oriented X 3, normal strength and tone. Normal  symmetric reflexes. Normal coordination and gait  ECOG PERFORMANCE STATUS: 1 - Symptomatic but completely ambulatory  Blood pressure 121/62, pulse 79, temperature 98.1 F (36.7 C), temperature source Oral, resp. rate 18, height 6' 1"  (1.854 m), weight 209 lb 8 oz (95 kg), SpO2 100 %.  LABORATORY DATA: Lab Results  Component Value Date   WBC 6.6 01/26/2019   HGB 13.7 01/26/2019   HCT 41.9 01/26/2019   MCV 93.7 01/26/2019   PLT 194 01/26/2019      Chemistry      Component Value Date/Time   NA 133 (L) 01/19/2019 1457   K 4.7 01/19/2019 1457   CL 95 (L) 01/19/2019 1457   CO2 20 01/19/2019 1457   BUN 9 01/19/2019 1457   CREATININE 1.02 01/19/2019 1457   CREATININE 1.09 01/13/2019 1012   CREATININE 1.09 03/23/2015 0928      Component Value Date/Time   CALCIUM 9.4 01/19/2019 1457   ALKPHOS 129 (H) 01/13/2019 1012   AST 10 (L) 01/13/2019 1012   ALT 7 01/13/2019 1012   BILITOT 0.6 01/13/2019 1012       RADIOGRAPHIC STUDIES: CT Abdomen Pelvis Wo Contrast  Result Date: 01/13/2019 CLINICAL DATA:  Non-small cell lung cancer, restaging EXAM: CT CHEST, ABDOMEN AND PELVIS WITHOUT CONTRAST TECHNIQUE: Multidetector CT imaging of the chest, abdomen and pelvis was performed following  the standard protocol without IV contrast. COMPARISON:  CT chest dated 10/14/2018.  PET-CT dated 03/03/2018. FINDINGS: CT CHEST FINDINGS Cardiovascular: The heart is normal in size. Trace pericardial effusion. No evidence of thoracic aortic aneurysm. Atherosclerotic calcifications of the aortic arch. Three vessel coronary atherosclerosis. Mediastinum/Nodes: No suspicious mediastinal lymphadenopathy. Visualized thyroid is unremarkable. Lungs/Pleura: Moderate centrilobular and paraseptal emphysematous changes, upper lung predominant. Radiation changes in the left upper lobe/lingula and superior segment left lower lobe. Near complete resolution of prior underlying central lingular nodule, measuring approximately 7 mm  (series 6/image 80). 3 mm subpleural nodule in the anterior right lower lobe (series 6/image 114), unchanged. No new/suspicious pulmonary nodules. No focal consolidation. No pleural effusion or pneumothorax. Musculoskeletal: Visualized osseous structures are within normal limits. CT ABDOMEN PELVIS FINDINGS Hepatobiliary: Unenhanced liver is unremarkable. Gallbladder is unremarkable. No intrahepatic or extrahepatic ductal dilatation. Pancreas: Within normal limits. Spleen: Within normal limits. Adrenals/Urinary Tract: 19 mm left adrenal nodule, previously 15 mm. Two right adrenal nodules measuring 23 and 18 mm, previously 16 and 11 mm when measured in a similar fashion. These are new from prior PET and are compatible with metastases. Kidneys are within normal limits, noting bilateral renal vascular calcifications. No hydronephrosis. Bladder is within normal limits. Stomach/Bowel: Stomach is within normal limits. No evidence of bowel obstruction. Normal appendix (series 2/image 95). Vascular/Lymphatic: 3.9 x 4.5 cm infrarenal abdominal aortic aneurysm, previously 4.0 x 4.6 cm on prior PET, unchanged. Atherosclerotic calcifications of the abdominal aorta and branch vessels. Bilateral common iliac artery stents. No suspicious abdominopelvic lymphadenopathy. Reproductive: Prostatomegaly. Other: No abdominopelvic ascites. Musculoskeletal: Grade 2 spondylolisthesis at L5-S1. Mild degenerative changes of the lower lumbar spine. IMPRESSION: Radiation changes in the left hemithorax, as above. Near complete resolution of prior lingular nodule, now measuring 7 mm. Progressive bilateral adrenal metastases, measuring up to 2.3 cm on the right. 3.5 x 4.5 cm infrarenal abdominal aortic aneurysm, grossly unchanged. In this patient, attention on follow-up is likely satisfactory. J Am Coll Radiol 2013; 10:789-794. Aortic aneurysm NOS (ICD10-I71.9) Additional stable ancillary findings as above. Aortic Atherosclerosis (ICD10-I70.0) and  Emphysema (ICD10-J43.9). Electronically Signed   By: Julian Hy M.D.   On: 01/13/2019 14:02   CT Chest Wo Contrast  Result Date: 01/13/2019 CLINICAL DATA:  Non-small cell lung cancer, restaging EXAM: CT CHEST, ABDOMEN AND PELVIS WITHOUT CONTRAST TECHNIQUE: Multidetector CT imaging of the chest, abdomen and pelvis was performed following the standard protocol without IV contrast. COMPARISON:  CT chest dated 10/14/2018.  PET-CT dated 03/03/2018. FINDINGS: CT CHEST FINDINGS Cardiovascular: The heart is normal in size. Trace pericardial effusion. No evidence of thoracic aortic aneurysm. Atherosclerotic calcifications of the aortic arch. Three vessel coronary atherosclerosis. Mediastinum/Nodes: No suspicious mediastinal lymphadenopathy. Visualized thyroid is unremarkable. Lungs/Pleura: Moderate centrilobular and paraseptal emphysematous changes, upper lung predominant. Radiation changes in the left upper lobe/lingula and superior segment left lower lobe. Near complete resolution of prior underlying central lingular nodule, measuring approximately 7 mm (series 6/image 80). 3 mm subpleural nodule in the anterior right lower lobe (series 6/image 114), unchanged. No new/suspicious pulmonary nodules. No focal consolidation. No pleural effusion or pneumothorax. Musculoskeletal: Visualized osseous structures are within normal limits. CT ABDOMEN PELVIS FINDINGS Hepatobiliary: Unenhanced liver is unremarkable. Gallbladder is unremarkable. No intrahepatic or extrahepatic ductal dilatation. Pancreas: Within normal limits. Spleen: Within normal limits. Adrenals/Urinary Tract: 19 mm left adrenal nodule, previously 15 mm. Two right adrenal nodules measuring 23 and 18 mm, previously 16 and 11 mm when measured in a similar fashion. These are new from  prior PET and are compatible with metastases. Kidneys are within normal limits, noting bilateral renal vascular calcifications. No hydronephrosis. Bladder is within normal  limits. Stomach/Bowel: Stomach is within normal limits. No evidence of bowel obstruction. Normal appendix (series 2/image 95). Vascular/Lymphatic: 3.9 x 4.5 cm infrarenal abdominal aortic aneurysm, previously 4.0 x 4.6 cm on prior PET, unchanged. Atherosclerotic calcifications of the abdominal aorta and branch vessels. Bilateral common iliac artery stents. No suspicious abdominopelvic lymphadenopathy. Reproductive: Prostatomegaly. Other: No abdominopelvic ascites. Musculoskeletal: Grade 2 spondylolisthesis at L5-S1. Mild degenerative changes of the lower lumbar spine. IMPRESSION: Radiation changes in the left hemithorax, as above. Near complete resolution of prior lingular nodule, now measuring 7 mm. Progressive bilateral adrenal metastases, measuring up to 2.3 cm on the right. 3.5 x 4.5 cm infrarenal abdominal aortic aneurysm, grossly unchanged. In this patient, attention on follow-up is likely satisfactory. J Am Coll Radiol 2013; 10:789-794. Aortic aneurysm NOS (ICD10-I71.9) Additional stable ancillary findings as above. Aortic Atherosclerosis (ICD10-I70.0) and Emphysema (ICD10-J43.9). Electronically Signed   By: Julian Hy M.D.   On: 01/13/2019 14:02   NM PET Image Restag (PS) Skull Base To Thigh  Result Date: 01/22/2019 CLINICAL DATA:  Subsequent treatment strategy for lung cancer. EXAM: NUCLEAR MEDICINE PET SKULL BASE TO THIGH TECHNIQUE: 10.5 mCi F-18 FDG was injected intravenously. Full-ring PET imaging was performed from the skull base to thigh after the radiotracer. CT data was obtained and used for attenuation correction and anatomic localization. Fasting blood glucose: 114 mg/dl COMPARISON:  03/03/2018 FINDINGS: Mediastinal blood pool activity: SUV max 2.12 Liver activity: SUV max NA NECK: No hypermetabolic lymph nodes in the neck. Incidental CT findings: none CHEST: Post treatment changes including volume loss, fibrosis and architectural distortion noted within the left midlung. Findings  likely reflect changes secondary to external beam radiation identified. The lingular nodule is no longer measurable. No suspicious nodule or mass identified within the lungs at this time. No hypermetabolic lymph nodes identified within the chest. Incidental CT findings: Moderate changes of centrilobular and paraseptal emphysema. Lad and left circumflex coronary artery calcifications. ABDOMEN/PELVIS: No abnormal FDG uptake identified within the liver, or spleen. New bilateral FDG avid adrenal nodules are identified compared with 03/03/2018. Right adrenal nodule measures 2.8 cm and has an SUV max of 8.84. Left adrenal nodule measures 2.1 cm and has an SUV max of 6.02. Small to medium size focus of mild uptake within the body of pancreas has an SUV max of 3.65. No corresponding mass identified. No hypermetabolic lymph nodes. Incidental CT findings: Infrarenal abdominal aortic aneurysm measures 3.8 cm, image 140/4. Aortic atherosclerosis. SKELETON: Multifocal FDG avid bone metastases are new from previous PET-CT. Index lytic lesion within the posterior column of left acetabulum measures 2.2 cm and has an SUV max of 9.12. Index FDG avid lesion within the C4 vertebra has an SUV max of 5.5. No corresponding CT abnormality noted. Index hypermetabolic lesion involving the T9 vertebra without corresponding CT abnormality noted. This has an SUV max of 9.59. Index hypermetabolic lesion within the L2 lesion without CT abnormality has an SUV max of 12.26. Incidental CT findings: none IMPRESSION: 1. Multifocal hypermetabolic bone metastases are noted. Most of these lesions are without corresponding CT abnormality. However, there is a hypermetabolic lytic lesion noted within the posterior column of the left acetabulum. 2. Bilateral hypermetabolic adrenal metastasis. 3. Focal area of increased uptake within the body of the pancreas without corresponding CT abnormality is nonspecific. Cannot rule out metastatic disease to the  pancreas however. 4. Changes  of external beam radiation noted within the mid left lung. The primary lung lesion no longer measurable. 5. Aortic Atherosclerosis (ICD10-I70.0) and Emphysema (ICD10-J43.9). 6. Infrarenal abdominal aortic aneurysm measures 3.8 cm. Recommend followup by ultrasound in 2 years. This recommendation follows ACR consensus guidelines: White Paper of the ACR Incidental Findings Committee II on Vascular Findings. J Am Coll Radiol 2013; 10:789-794. Aortic aneurysm NOS (ICD10-I71.9). Electronically Signed   By: Kerby Moors M.D.   On: 01/22/2019 10:00   CT Biopsy  Result Date: 01/26/2019 CLINICAL DATA:  Lung carcinoma. Hypermetabolic bilateral adrenal lesions on PET-CT EXAM: CT GUIDED CORE BIOPSY OF LEFT RENAL LESION ANESTHESIA/SEDATION: Intravenous Fentanyl 141mg and Versed 272mwere administered as conscious sedation during continuous monitoring of the patient's level of consciousness and physiological / cardiorespiratory status by the radiology RN, with a total moderate sedation time of 25 minutes. PROCEDURE: The procedure risks, benefits, and alternatives were explained to the patient. Questions regarding the procedure were encouraged and answered. The patient understands and consents to the procedure. Initially, patient was placed prone. Scanning showed lung complicating access to adrenal glands. Patient was then placed left lateral decubitus. Select axial scans through the thorax performed. Appropriate skin entry site was determined and marked. The operative field was prepped with chlorhexidinein a sterile fashion, and a sterile drape was applied covering the operative field. A sterile gown and sterile gloves were used for the procedure. Local anesthesia was provided with 1% Lidocaine. Under CT fluoroscopic guidance, a 17 gauge trocar needle was advanced to the margin of the lesion. Once needle tip position was confirmed, coaxial 18-gauge core biopsy samples were obtained, submitted in  formalin to surgical pathology. The guide needle was removed. Postprocedure scans show minimal retroperitoneal hemorrhage. Patient remained asymptomatic. COMPLICATIONS: None immediate FINDINGS: Left adrenal nodule was localized. Representative core biopsy samples obtained as above. IMPRESSION: 1. Technically successful CT-guided core biopsy, left adrenal nodule Electronically Signed   By: D Lucrezia Europe.D.   On: 01/26/2019 11:03    ASSESSMENT AND PLAN: This is a very pleasant [age] old white male recently diagnosed with unresectable stage IIb non-small cell lung cancer, adenocarcinoma presented with right upper lobe lung nodule in addition to right hilar lymphadenopathy diagnosed in February 2020.  The patient is now a good candidate for surgical resection. The patient underwent a course of concurrent chemoradiation with weekly carboplatin and paclitaxel status post 7 cycles completed in May 2020. The patient is currently on observation and he is feeling fine. He had repeat CT scan of the chest, abdomen pelvis performed recently.  I personally and independently reviewed the scan images and discussed the results with the patient today. Unfortunately his a scan showed further progression of the bilateral adrenal lesions suspicious for metastasis. This finding were confirmed by a PET scan. The patient underwent CT-guided core biopsy of the left adrenal gland lesion and the final pathology was consistent with metastatic adenocarcinoma of the lung. I had a lengthy discussion with the patient and his wife who was available by phone during the visit about his current disease stage, prognosis and treatment options. I recommended for the patient to have blood test for molecular studies by [facility] I will also send his tissue block to be tested by [facility] for molecular studies and PD-L1 expression. I discussed with the patient the treatment options including palliative care and hospice referral versus  consideration of palliative systemic chemotherapy with carboplatin for AUC of 5, Alimta 500 mg/M2 and Keytruda 200 mg IV  every 3 weeks if he has no actionable mutations. I discussed with the patient the adverse effect of this treatment including but not limited to alopecia, myelosuppression, nausea and vomiting, peripheral neuropathy, liver or renal dysfunction in addition to the immunotherapy adverse effects. The patient would like to proceed with the treatment as planned and he is expected to start the first cycle of this treatment on February 16, 2018 one of the molecular studies are negative for actionable mutations. He will receive vitamin B12 injection today. I will send prescription for folic acid 1 mg p.o. daily in addition to Compazine 10 mg p.o. every 6 hours as needed for nausea to his pharmacy. The patient will come back for follow-up visit in 2 weeks for evaluation before starting the first cycle of his treatment. For hypertension he will continue with his current treatment with lisinopril. The patient was advised to call immediately if he has any concerning symptoms in the interval. The patient voices understanding of current disease status and treatment options and is in agreement with the current care plan. All questions were answered. The patient knows to call the clinic with any problems, questions or concerns. We can certainly see the patient much sooner if necessary.  Disclaimer: This note was dictated with voice recognition software. Similar sounding words can inadvertently be transcribed and may not be corrected upon review.

## 2019-02-03 NOTE — Progress Notes (Signed)
DISCONTINUE ON PATHWAY REGIMEN - Non-Small Cell Lung     Administer weekly:     Paclitaxel      Carboplatin   **Always confirm dose/schedule in your pharmacy ordering system**  REASON: Disease Progression PRIOR TREATMENT: [id number]: Carboplatin AUC=2 + Paclitaxel 45 mg/m2 Weekly During Radiation TREATMENT RESPONSE: Progressive Disease (PD)  START OFF PATHWAY REGIMEN - Non-Small Cell Lung   [id number]:Pembrolizumab 200 mg IV D1 + Pemetrexed 500 mg/m2 IV D1 q21 Days (Maintenance):   A cycle is every 21 days:     Pembrolizumab      Pemetrexed   **Always confirm dose/schedule in your pharmacy ordering system**  Patient Characteristics: Stage IV Metastatic, Nonsquamous, Initial Chemotherapy/Immunotherapy, PS = 0, 1, ALK or EGFR or ROS1 or NTRK or MET or RET Genomic Alterations - Awaiting Test Results AJCC T Category: T1c Current Disease Status: Distant Metastases AJCC N Category: N1 AJCC M Category: M1c AJCC 8 Stage Grouping: IVB Histology: Nonsquamous Cell ROS1 Rearrangement Status: Awaiting Test Results T790M Mutation Status: Not Applicable - EGFR Mutation Negative/Unknown Other Mutations/Biomarkers: No Other Actionable Mutations Chemotherapy/Immunotherapy LOT: Initial Chemotherapy/Immunotherapy Molecular Targeted Therapy: Not Appropriate MET Exon 14 Mutation Status: Awaiting Test Results RET Gene Fusion Status: Awaiting Test Results EGFR Mutation Status: Awaiting Test Results NTRK Gene Fusion Status: Awaiting Test Results PD-L1 Expression Status: Awaiting Test Results ALK Rearrangement Status: Awaiting Test Results BRAF V600E Mutation Status: Awaiting Test Results ECOG Performance Status: 1 Biomarker Assessment Status Confirmation: Awaiting genomic biomarker test(s) results and need to start chemotherapy Intent of Therapy: Non-Curative / Palliative Intent, Discussed with Patient

## 2019-02-05 ENCOUNTER — Telehealth: Payer: Self-pay | Admitting: Internal Medicine

## 2019-02-05 NOTE — Telephone Encounter (Signed)
Scheduled per los. Called and left msg. Mailed printout  °

## 2019-02-10 ENCOUNTER — Other Ambulatory Visit: Payer: Self-pay

## 2019-02-10 ENCOUNTER — Encounter: Payer: Self-pay | Admitting: Podiatry

## 2019-02-10 ENCOUNTER — Ambulatory Visit (INDEPENDENT_AMBULATORY_CARE_PROVIDER_SITE_OTHER): Payer: Medicare Other | Admitting: Podiatry

## 2019-02-10 VITALS — BP 91/67 | HR 66

## 2019-02-10 DIAGNOSIS — I739 Peripheral vascular disease, unspecified: Secondary | ICD-10-CM

## 2019-02-10 DIAGNOSIS — B351 Tinea unguium: Secondary | ICD-10-CM | POA: Diagnosis not present

## 2019-02-10 DIAGNOSIS — M79675 Pain in left toe(s): Secondary | ICD-10-CM

## 2019-02-10 DIAGNOSIS — R600 Localized edema: Secondary | ICD-10-CM | POA: Diagnosis not present

## 2019-02-10 DIAGNOSIS — M79674 Pain in right toe(s): Secondary | ICD-10-CM

## 2019-02-10 NOTE — Patient Instructions (Signed)

## 2019-02-11 DIAGNOSIS — I70213 Atherosclerosis of native arteries of extremities with intermittent claudication, bilateral legs: Secondary | ICD-10-CM | POA: Diagnosis not present

## 2019-02-11 DIAGNOSIS — I10 Essential (primary) hypertension: Secondary | ICD-10-CM | POA: Diagnosis not present

## 2019-02-11 DIAGNOSIS — I723 Aneurysm of iliac artery: Secondary | ICD-10-CM | POA: Diagnosis not present

## 2019-02-11 DIAGNOSIS — I6523 Occlusion and stenosis of bilateral carotid arteries: Secondary | ICD-10-CM | POA: Diagnosis not present

## 2019-02-11 DIAGNOSIS — I739 Peripheral vascular disease, unspecified: Secondary | ICD-10-CM | POA: Diagnosis not present

## 2019-02-11 DIAGNOSIS — I714 Abdominal aortic aneurysm, without rupture: Secondary | ICD-10-CM | POA: Diagnosis not present

## 2019-02-11 DIAGNOSIS — G458 Other transient cerebral ischemic attacks and related syndromes: Secondary | ICD-10-CM | POA: Diagnosis not present

## 2019-02-12 ENCOUNTER — Other Ambulatory Visit: Payer: Self-pay

## 2019-02-12 ENCOUNTER — Inpatient Hospital Stay: Payer: Medicare Other | Attending: Internal Medicine

## 2019-02-12 DIAGNOSIS — R21 Rash and other nonspecific skin eruption: Secondary | ICD-10-CM | POA: Insufficient documentation

## 2019-02-12 DIAGNOSIS — R0609 Other forms of dyspnea: Secondary | ICD-10-CM | POA: Insufficient documentation

## 2019-02-12 DIAGNOSIS — R630 Anorexia: Secondary | ICD-10-CM | POA: Insufficient documentation

## 2019-02-12 DIAGNOSIS — Z79899 Other long term (current) drug therapy: Secondary | ICD-10-CM | POA: Insufficient documentation

## 2019-02-12 DIAGNOSIS — Z5112 Encounter for antineoplastic immunotherapy: Secondary | ICD-10-CM | POA: Insufficient documentation

## 2019-02-12 DIAGNOSIS — R5383 Other fatigue: Secondary | ICD-10-CM | POA: Insufficient documentation

## 2019-02-12 DIAGNOSIS — R58 Hemorrhage, not elsewhere classified: Secondary | ICD-10-CM | POA: Insufficient documentation

## 2019-02-12 DIAGNOSIS — K5900 Constipation, unspecified: Secondary | ICD-10-CM | POA: Insufficient documentation

## 2019-02-12 DIAGNOSIS — R197 Diarrhea, unspecified: Secondary | ICD-10-CM | POA: Insufficient documentation

## 2019-02-12 DIAGNOSIS — I10 Essential (primary) hypertension: Secondary | ICD-10-CM | POA: Insufficient documentation

## 2019-02-12 DIAGNOSIS — R590 Localized enlarged lymph nodes: Secondary | ICD-10-CM | POA: Insufficient documentation

## 2019-02-12 DIAGNOSIS — Z5111 Encounter for antineoplastic chemotherapy: Secondary | ICD-10-CM | POA: Insufficient documentation

## 2019-02-12 DIAGNOSIS — I2510 Atherosclerotic heart disease of native coronary artery without angina pectoris: Secondary | ICD-10-CM | POA: Insufficient documentation

## 2019-02-12 DIAGNOSIS — I739 Peripheral vascular disease, unspecified: Secondary | ICD-10-CM | POA: Insufficient documentation

## 2019-02-12 DIAGNOSIS — I719 Aortic aneurysm of unspecified site, without rupture: Secondary | ICD-10-CM | POA: Insufficient documentation

## 2019-02-12 DIAGNOSIS — I700 Atherosclerosis of aorta: Secondary | ICD-10-CM | POA: Insufficient documentation

## 2019-02-12 DIAGNOSIS — C7951 Secondary malignant neoplasm of bone: Secondary | ICD-10-CM | POA: Insufficient documentation

## 2019-02-12 DIAGNOSIS — C3411 Malignant neoplasm of upper lobe, right bronchus or lung: Secondary | ICD-10-CM | POA: Insufficient documentation

## 2019-02-12 DIAGNOSIS — R110 Nausea: Secondary | ICD-10-CM | POA: Insufficient documentation

## 2019-02-12 DIAGNOSIS — R0981 Nasal congestion: Secondary | ICD-10-CM | POA: Insufficient documentation

## 2019-02-12 DIAGNOSIS — I714 Abdominal aortic aneurysm, without rupture: Secondary | ICD-10-CM | POA: Insufficient documentation

## 2019-02-12 DIAGNOSIS — Z888 Allergy status to other drugs, medicaments and biological substances status: Secondary | ICD-10-CM | POA: Insufficient documentation

## 2019-02-12 NOTE — Progress Notes (Signed)
Subjective: Seth [name] presents today referred by [name], MD with cc of painful, discolored, thick toenails which interfere with daily activities.  Pain is aggravated when wearing enclosed shoe gear.   Patient states it's been over a year since his nails have been cut.   He has wounds on LE and which are managed by Vascular.   He had a fall 8-9 weeks ago and  fractured his ankle LLE and is wearing a walking boot. He is followed by Dr. Noemi Chapel, Orthopedics.  He states he quit smoking 3 years ago, but is now diagnosed with lung cancer.   Past Medical History:  Diagnosis Date  . AAA (abdominal aortic aneurysm) ([place])   . Allergic rhinitis   . Anxiety   . Back pain   . CAD (coronary artery disease)   . Cataracts, bilateral   . Emphysema, unspecified ([place])     " moderate"  . Essential hypertension, benign   . GERD (gastroesophageal reflux disease)   . Gout   . Headache    migraines  . Heart murmur    as a child only  . Hilar adenopathy   . History of hiatal hernia   . History of sciatica   . Hyperlipidemia   . lung ca dx'd 02/2018  . Lung nodule   . Pneumonia    as child  . Prostate hypertrophy   . PVD (peripheral vascular disease) ([place])   . Seasonal allergies   . Subclavian steal syndrome of left subclavian artery   . Wears dentures   . Wears dentures      Patient Active Problem List   Diagnosis Date Noted  . Adenocarcinoma of left lung, stage 4 ([place]) 02/03/2019  . Encounter for antineoplastic immunotherapy 02/03/2019  . Frequent nosebleeds 05/28/2018  . Encounter for antineoplastic chemotherapy 04/18/2018  . Goals of care, counseling/discussion 04/18/2018  . Adenocarcinoma of left lung, stage 2 ([place]) 03/27/2018  . Abnormal PET of left lung   . S/P bronchoscopy   . Solitary lung nodule 02/20/2018  . Hilar adenopathy 02/20/2018  . Abnormal findings on diagnostic imaging of lung 02/20/2018  . Platelet inhibition due to Plavix 02/20/2018  . PVD (peripheral  vascular disease) ([place]) 10/15/2013  . Tobacco use disorder 10/15/2013  . Pure hypercholesterolemia 10/15/2013  . Coronary artery disease involving native coronary artery of native heart without angina pectoris 10/15/2013  . Cataract 09/29/2013  . Colon cancer screening 09/25/2013  . AAA (abdominal aortic aneurysm) without rupture ([place]) 10/22/2012  . Nicotine addiction 10/17/2012  . Atherosclerosis of native arteries of the extremities with ulceration(440.23) 10/17/2012  . Carotid bruit 10/17/2012  . Other nonspecific abnormal cardiovascular system function study 04/10/2012  . History of sciatica   . Essential hypertension, benign   . Seasonal allergies      Past Surgical History:  Procedure Laterality Date  . CARDIAC CATHETERIZATION     stent placement  . COLONOSCOPY W/ BIOPSIES AND POLYPECTOMY    . ILIAC ARTERY STENT    . MULTIPLE TOOTH EXTRACTIONS    . VIDEO BRONCHOSCOPY WITH ENDOBRONCHIAL NAVIGATION N/A 02/26/2018   Procedure: VIDEO BRONCHOSCOPY WITH ENDOBRONCHIAL NAVIGATION;  Surgeon: Garner Nash, DO;  Location: [facility];  Service: Thoracic;  Laterality: N/A;  . VIDEO BRONCHOSCOPY WITH ENDOBRONCHIAL NAVIGATION N/A 03/19/2018   Procedure: VIDEO BRONCHOSCOPY WITH ENDOBRONCHIAL NAVIGATION and endobronchial ultrasound;  Surgeon: Garner Nash, DO;  Location: [facility];  Service: Thoracic;  Laterality: N/A;  . VIDEO BRONCHOSCOPY WITH ENDOBRONCHIAL ULTRASOUND N/A 02/26/2018  Procedure: VIDEO BRONCHOSCOPY WITH ENDOBRONCHIAL ULTRASOUND;  Surgeon: Garner Nash, DO;  Location: [facility];  Service: Thoracic;  Laterality: N/A;  . VIDEO BRONCHOSCOPY WITH ENDOBRONCHIAL ULTRASOUND N/A 03/19/2018   Procedure: NAVIGATION BRONCHOSCOPY;  Surgeon: Garner Nash, DO;  Location: [facility];  Service: Thoracic;  Laterality: N/A;     Current Outpatient Medications on File Prior to Visit  Medication Sig Dispense Refill  . atorvastatin (LIPITOR) 40 MG tablet Take 1 tablet (40 mg total) by mouth daily. 90  tablet 3  . cetirizine (ZYRTEC) 10 MG tablet Take 10 mg by mouth daily at 12 noon.    . clopidogrel (PLAVIX) 75 MG tablet TAKE 1 TABLET BY MOUTH EVERY DAY 90 tablet 0  . famotidine (PEPCID) 20 MG tablet Take 20 mg by mouth 2 (two) times daily. Noon & in the evening.    . folic acid (FOLVITE) 1 MG tablet Take 1 tablet (1 mg total) by mouth daily. 30 tablet 4  . lisinopril (ZESTRIL) 10 MG tablet Take 1 tablet (10 mg total) by mouth daily. 90 tablet 1  . prochlorperazine (COMPAZINE) 10 MG tablet Take 1 tablet (10 mg total) by mouth every 6 (six) hours as needed for nausea or vomiting. 30 tablet 0   Current Facility-Administered Medications on File Prior to Visit  Medication Dose Route Frequency Provider Last Rate Last Admin  . 0.9 %  sodium chloride infusion  500 mL Intravenous Once Irene Shipper, MD         Allergies  Allergen Reactions  . Biaxin [Clarithromycin] Shortness Of Breath  . Clarithromycin Shortness Of Breath  . Iodinated Diagnostic Agents Hives  . Prednisone Other (See Comments)    Patient states he turns REALLY RED all over.  . Cortisone Other (See Comments)    Turns red from breast up  . Flomax [Tamsulosin Hcl]     Blurred vision and lower back pain   . Hepatitis B Virus Vaccines     Nerve problems States his previous doctor told him it was due to this   . Iodine Hives    Dye from nuclear medicine test     Social History   Occupational History  . Occupation: retired  Tobacco Use  . Smoking status: Former Smoker    Packs/day: 3.00    Years: 50.00    Pack years: 150.00    Types: Cigarettes    Quit date: 04/05/2016    Years since quitting: 2.8  . Smokeless tobacco: Never Used  Substance and Sexual Activity  . Alcohol use: Yes    Alcohol/week: 0.0 standard drinks    Comment: rare  . Drug use: No  . Sexual activity: Not on file     Family History  Problem Relation Age of Onset  . Hypertension Mother   . Heart disease Mother   . Stroke Mother   . Heart  disease Paternal Grandmother   . Diabetes Paternal Grandmother   . Hyperlipidemia Paternal Grandmother   . Diabetes Sister   . Heart disease Sister   . Hyperlipidemia Sister   . Heart disease Brother   . Hyperlipidemia Brother   . Hypertension Brother   . Diabetes Paternal Grandfather   . Heart disease Paternal Grandfather   . Hyperlipidemia Paternal Grandfather   . Heart disease Maternal Uncle        x 2  . Cancer Maternal Grandmother        type unknown, ? lung  . Leukemia Maternal Uncle  Immunization History  Administered Date(s) Administered  . Hepatitis B 02/06/2000  . Td 02/05/2005     Review of systems: Positive Findings in bold print.  Constitutional:  chills, fatigue, fever, sweats, weight change Communication: Optometrist, sign Ecologist, hand writing, iPad/Android device Head: headaches, head injury Eyes: changes in vision, eye pain, glaucoma, cataracts, macular degeneration, diplopia, glare,  light sensitivity, eyeglasses or contacts, blindness Ears nose mouth throat: hearing impaired, hearing aids,  ringing in ears, deaf, sign language,  vertigo,   nosebleeds,  rhinitis,  cold sores, snoring, swollen glands Cardiovascular: HTN, edema, arrhythmia, pacemaker in place, defibrillator in place, chest pain/tightness, chronic anticoagulation, blood clot, heart failure, MI Peripheral Vascular: leg cramps, varicose veins, blood clots, lymphedema, varicosities Respiratory:  difficulty breathing, denies congestion, SOB, wheezing, cough, emphysema Gastrointestinal: change in appetite or weight, abdominal pain, constipation, diarrhea, nausea, vomiting, vomiting blood, change in bowel habits, abdominal pain, jaundice, rectal bleeding, hemorrhoids, GERD Genitourinary:  nocturia,  pain on urination, polyuria,  blood in urine, Foley catheter, urinary urgency, ESRD on hemodialysis Musculoskeletal: amputation, cramping, stiff joints, painful joints, decreased joint motion,  fracture, OA, gout, hemiplegia, paraplegia, uses cane, wheelchair bound, uses walker, uses rollator Skin: +changes in toenails, color change, dryness, itching, mole changes,  rash, wound(s) Neurological: headaches, numbness in feet, paresthesias in feet, burning in feet, fainting,  seizures, change in speech. denies headaches, memory problems/poor historian, cerebral palsy, weakness, paralysis, CVA, TIA Endocrine: diabetes, hypothyroidism, hyperthyroidism,  goiter, dry mouth, flushing, heat intolerance,  cold intolerance,  excessive thirst, denies polyuria,  nocturia Hematological:  easy bleeding, excessive bleeding, easy bruising, enlarged lymph nodes, on long term blood thinner, history of past transusions Allergy/immunological:  hives, eczema, frequent infections, multiple drug allergies, seasonal allergies, transplant recipient, multiple food allergies Psychiatric:  anxiety, depression, mood disorder, suicidal ideations, hallucinations, insomnia  Objective: Vitals:   02/10/19 1032  BP: 91/67  Pulse: 66   Objective: 74 y.o. Caucasian male, WD, WN in NAD. AAO x 3. Pleasant, conversant and cooperative.  Vascular Examination: Capillary refill time to digits <4 seconds b/l.  Dorsalis pedis and posterior tibial pulses nonpalpable b/l.   No digital hair x 10 digits.  Skin temperature gradient warm to cool b/l.   +2 pitting edema b/l feet/ankles b/l.   Dermatological Examination: Skin thin and atrophied b/l.  Toenails 1-5 b/l discolored, thick, dystrophic with subungual debris and pain with palpation to nailbeds due to thickness of nails.  Musculoskeletal: Muscle strength 5/5 to all LE muscle groups  Neurological: Sensation intact with 10 gram monofilament b/l LE.  Vibratory sensation diminished b/l.  Assessment: 1. Painful onychomycosis toenails 1-5 b/l  2. Edema b/l LE 3. PAD  Plan: 1. Discussed onychomycosis and treatment options.  Literature dispensed on  today. 2. Toenails 1-5 b/l were debrided in length and girth without iatrogenic bleeding. 3. Patient to continue soft, supportive shoe gear daily. 4. Patient to report any pedal injuries to medical professional immediately. 5. Follow up 3 months.  6. Patient/POA to call should there be a concern in the interim.

## 2019-02-13 LAB — GUARDANT 360

## 2019-02-16 ENCOUNTER — Encounter (HOSPITAL_COMMUNITY): Payer: Self-pay | Admitting: Internal Medicine

## 2019-02-17 NOTE — Progress Notes (Signed)
[name] OFFICE PROGRESS NOTE  [name], MD [address] [place] [address]  DIAGNOSIS: Metastatic non-small cell lung cancer, adenocarcinoma initially diagnosed as stage IIB (T1c, N1, M0) non-small cell lung cancer favoring adenocarcinoma presented with right upper lobe lung nodule in addition to right hilar lymphadenopathy diagnosed in February 2020.  The patient is not a good surgical candidate for resection.  He had evidence for disease recurrence in December 2020 with adrenal gland metastasis.  Molecular Biomarkers: Negative for any actionable mutations  PRIOR THERAPY: Concurrent chemoradiation with weekly carboplatin for AUC of 2 and paclitaxel 45 mg/M2.  First dose May 05, 2018.  Status post 7 cycles.  Last dose was given Jun 16, 2018  CURRENT THERAPY: Systemic chemotherapy with carboplatin for AUC of 5, Alimta 500 mg/M2 and Keytruda 200 mg IV every 3 weeks.  First dose starting February 18, 2019.  INTERVAL HISTORY: Seth [name] 74 y.o. male returns to the clinic today for a follow-up visit, accompanied by his wife.  The patient is feeling well today without any concerning complaints except for he has some concerns anticipating how well he will tolerate his new chemotherapy today. I reassured him that we will see him for a one week follow up visit next week but we are available sooner if needed. His wife is concerned about his left ankle fracture, PVD, and leg wounds which is being managed by multiple disciplines including his PCP, orthopedics, podiatry, and his vascular surgeon.   The patient denies any recent fever, chills, or night sweats. He denies any chest pain, shortness of breath, cough, or hemoptysis but he does endorse chronic nasal congestion for which he states he was diagnosed with chronic sinusitis. He takes zyrtec and uses normal saline nasal sprays. He denies any vomiting but he does report feeling nauseous/gagging when eating and believes it  to be related to being nervous for chemotherapy. He reports his baseline on and off again diarrhea and constipation. He denies any headache or visual changes.  The patient recently had a restaging CT scan performed which showed evidence of disease progression with a new biopsy-proven adrenal metastasis from the lung.  The patient recently had molecular testing by [employer] performed which was negative for any actionable mutations The patient is here today for evaluation before starting his first cycle of systemic chemotherapy.  MEDICAL HISTORY: Past Medical History:  Diagnosis Date  . AAA (abdominal aortic aneurysm) ([place])   . Allergic rhinitis   . Anxiety   . Back pain   . CAD (coronary artery disease)   . Cataracts, bilateral   . Emphysema, unspecified ([place])     " moderate"  . Essential hypertension, benign   . GERD (gastroesophageal reflux disease)   . Gout   . Headache    migraines  . Heart murmur    as a child only  . Hilar adenopathy   . History of hiatal hernia   . History of sciatica   . Hyperlipidemia   . lung ca dx'd 02/2018  . Lung nodule   . Pneumonia    as child  . Prostate hypertrophy   . PVD (peripheral vascular disease) ([place])   . Seasonal allergies   . Subclavian steal syndrome of left subclavian artery   . Wears dentures   . Wears dentures     ALLERGIES:  is allergic to biaxin [clarithromycin]; clarithromycin; iodinated diagnostic agents; prednisone; cortisone; flomax [tamsulosin hcl]; hepatitis b virus vaccines; and iodine.  MEDICATIONS:  Current Outpatient Medications  Medication Sig Dispense Refill  . atorvastatin (LIPITOR) 40 MG tablet Take 1 tablet (40 mg total) by mouth daily. 90 tablet 3  . cetirizine (ZYRTEC) 10 MG tablet Take 10 mg by mouth daily at 12 noon.    . clopidogrel (PLAVIX) 75 MG tablet TAKE 1 TABLET BY MOUTH EVERY DAY 90 tablet 0  . famotidine (PEPCID) 20 MG tablet Take 20 mg by mouth 2 (two) times daily. Noon & in the evening.    .  folic acid (FOLVITE) 1 MG tablet Take 1 tablet (1 mg total) by mouth daily. 30 tablet 4  . lisinopril (ZESTRIL) 10 MG tablet Take 1 tablet (10 mg total) by mouth daily. 90 tablet 1  . prochlorperazine (COMPAZINE) 10 MG tablet Take 1 tablet (10 mg total) by mouth every 6 (six) hours as needed for nausea or vomiting. 30 tablet 0  . terbinafine (LAMISIL) 250 MG tablet Take 250 mg by mouth daily.     Current Facility-Administered Medications  Medication Dose Route Frequency Provider Last Rate Last Admin  . 0.9 %  sodium chloride infusion  500 mL Intravenous Once Irene Shipper, MD       Facility-Administered Medications Ordered in Other Visits  Medication Dose Route Frequency Provider Last Rate Last Admin  . CARBOplatin (PARAPLATIN) 560 mg in sodium chloride 0.9 % 250 mL chemo infusion  560 mg Intravenous Once Curt Bears, MD      . fosaprepitant (EMEND) 150 mg in sodium chloride 0.9 % 145 mL IVPB  150 mg Intravenous Once Curt Bears, MD 450 mL/hr at 02/18/19 1352 150 mg at 02/18/19 1352  . pembrolizumab (KEYTRUDA) 200 mg in sodium chloride 0.9 % 50 mL chemo infusion  200 mg Intravenous Once Curt Bears, MD      . PEMEtrexed (ALIMTA) 1,100 mg in sodium chloride 0.9 % 100 mL chemo infusion  500 mg/m2 (Treatment Plan Recorded) Intravenous Once Curt Bears, MD        SURGICAL HISTORY:  Past Surgical History:  Procedure Laterality Date  . CARDIAC CATHETERIZATION     stent placement  . COLONOSCOPY W/ BIOPSIES AND POLYPECTOMY    . ILIAC ARTERY STENT    . MULTIPLE TOOTH EXTRACTIONS    . VIDEO BRONCHOSCOPY WITH ENDOBRONCHIAL NAVIGATION N/A 02/26/2018   Procedure: VIDEO BRONCHOSCOPY WITH ENDOBRONCHIAL NAVIGATION;  Surgeon: Garner Nash, DO;  Location: [facility];  Service: Thoracic;  Laterality: N/A;  . VIDEO BRONCHOSCOPY WITH ENDOBRONCHIAL NAVIGATION N/A 03/19/2018   Procedure: VIDEO BRONCHOSCOPY WITH ENDOBRONCHIAL NAVIGATION and endobronchial ultrasound;  Surgeon: Garner Nash,  DO;  Location: [facility];  Service: Thoracic;  Laterality: N/A;  . VIDEO BRONCHOSCOPY WITH ENDOBRONCHIAL ULTRASOUND N/A 02/26/2018   Procedure: VIDEO BRONCHOSCOPY WITH ENDOBRONCHIAL ULTRASOUND;  Surgeon: Garner Nash, DO;  Location: [facility];  Service: Thoracic;  Laterality: N/A;  . VIDEO BRONCHOSCOPY WITH ENDOBRONCHIAL ULTRASOUND N/A 03/19/2018   Procedure: NAVIGATION BRONCHOSCOPY;  Surgeon: Garner Nash, DO;  Location: [facility];  Service: Thoracic;  Laterality: N/A;    REVIEW OF SYSTEMS:   Review of Systems  Constitutional: Positive for fatigue. Negative for appetite change, chills, fever and unexpected weight change.  HENT: Positive for chronic rhinitis. Negative for mouth sores, nosebleeds, sore throat and trouble swallowing.   Eyes: Negative for eye problems and icterus.  Respiratory: Negative for cough, hemoptysis, shortness of breath and wheezing.  Cardiovascular: Negative for chest pain. Gastrointestinal: Positive for occasional nausea. Negative for abdominal pain, constipation, diarrhea, and vomiting.  Genitourinary:  Negative for bladder incontinence, difficulty urinating, dysuria, frequency and hematuria.   Musculoskeletal: Negative for back pain, gait problem, neck pain and neck stiffness.  Skin: Positive for reported venous status dermitis.  Neurological: Negative for dizziness, extremity weakness, gait problem, headaches, light-headedness and seizures.  Hematological: Negative for adenopathy. Does not bruise/bleed easily.  Psychiatric/Behavioral: Negative for confusion, depression and sleep disturbance. The patient is not nervous/anxious.     PHYSICAL EXAMINATION:  Blood pressure 125/74, pulse 74, temperature 98.9 F (37.2 C), temperature source Temporal, resp. rate 18, height 6\' 1"  (1.854 m), weight 205 lb 4.8 oz (93.1 kg), SpO2 96 %.  ECOG PERFORMANCE STATUS: 1 - Symptomatic but completely ambulatory  Physical Exam  Constitutional: Oriented to person, place, and time and  well-developed, well-nourished, and in no distress.  HENT:  Head: Normocephalic and atraumatic.  Mouth/Throat: Oropharynx is clear and moist. No oropharyngeal exudate.  Eyes: Conjunctivae are normal. Right eye exhibits no discharge. Left eye exhibits no discharge. No scleral icterus.  Neck: Normal range of motion. Neck supple.  Cardiovascular: Normal rate, regular rhythm, normal heart sounds and intact distal pulses.   Pulmonary/Chest: Effort normal and breath sounds normal. No respiratory distress. No wheezes. No rales.  Abdominal: Soft. Bowel sounds are normal. Exhibits no distension and no mass. There is no tenderness.  Musculoskeletal: Normal range of motion.  Lymphadenopathy:    No cervical adenopathy.  Neurological: Alert and oriented to person, place, and time. Exhibits normal muscle tone. Gait normal. Ambulating with walking boot. Skin: Skin is warm and dry. Walking foot present on left foot. Oncchomycosis noted on left toe followed by podiatry.  Psychiatric: Mood, memory and judgment normal.  Vitals reviewed.  LABORATORY DATA: Lab Results  Component Value Date   WBC 8.6 02/18/2019   HGB 14.1 02/18/2019   HCT 42.2 02/18/2019   MCV 90.2 02/18/2019   PLT 218 02/18/2019      Chemistry      Component Value Date/Time   NA 135 02/18/2019 1138   NA 133 (L) 01/19/2019 1457   K 4.2 02/18/2019 1138   CL 103 02/18/2019 1138   CO2 21 (L) 02/18/2019 1138   BUN 7 (L) 02/18/2019 1138   BUN 9 01/19/2019 1457   CREATININE 0.98 02/18/2019 1138   CREATININE 1.09 03/23/2015 0928      Component Value Date/Time   CALCIUM 8.6 (L) 02/18/2019 1138   ALKPHOS 155 (H) 02/18/2019 1138   AST 11 (L) 02/18/2019 1138   ALT <6 02/18/2019 1138   BILITOT 0.8 02/18/2019 1138       RADIOGRAPHIC STUDIES:  NM PET Image Restag (PS) Skull Base To Thigh  Result Date: 01/22/2019 CLINICAL DATA:  Subsequent treatment strategy for lung cancer. EXAM: NUCLEAR MEDICINE PET SKULL BASE TO THIGH TECHNIQUE:  10.5 mCi F-18 FDG was injected intravenously. Full-ring PET imaging was performed from the skull base to thigh after the radiotracer. CT data was obtained and used for attenuation correction and anatomic localization. Fasting blood glucose: 114 mg/dl COMPARISON:  03/03/2018 FINDINGS: Mediastinal blood pool activity: SUV max 2.12 Liver activity: SUV max NA NECK: No hypermetabolic lymph nodes in the neck. Incidental CT findings: none CHEST: Post treatment changes including volume loss, fibrosis and architectural distortion noted within the left midlung. Findings likely reflect changes secondary to external beam radiation identified. The lingular nodule is no longer measurable. No suspicious nodule or mass identified within the lungs at this time. No hypermetabolic lymph nodes identified within the chest. Incidental CT findings: Moderate changes of  centrilobular and paraseptal emphysema. Lad and left circumflex coronary artery calcifications. ABDOMEN/PELVIS: No abnormal FDG uptake identified within the liver, or spleen. New bilateral FDG avid adrenal nodules are identified compared with 03/03/2018. Right adrenal nodule measures 2.8 cm and has an SUV max of 8.84. Left adrenal nodule measures 2.1 cm and has an SUV max of 6.02. Small to medium size focus of mild uptake within the body of pancreas has an SUV max of 3.65. No corresponding mass identified. No hypermetabolic lymph nodes. Incidental CT findings: Infrarenal abdominal aortic aneurysm measures 3.8 cm, image 140/4. Aortic atherosclerosis. SKELETON: Multifocal FDG avid bone metastases are new from previous PET-CT. Index lytic lesion within the posterior column of left acetabulum measures 2.2 cm and has an SUV max of 9.12. Index FDG avid lesion within the C4 vertebra has an SUV max of 5.5. No corresponding CT abnormality noted. Index hypermetabolic lesion involving the T9 vertebra without corresponding CT abnormality noted. This has an SUV max of 9.59. Index  hypermetabolic lesion within the L2 lesion without CT abnormality has an SUV max of 12.26. Incidental CT findings: none IMPRESSION: 1. Multifocal hypermetabolic bone metastases are noted. Most of these lesions are without corresponding CT abnormality. However, there is a hypermetabolic lytic lesion noted within the posterior column of the left acetabulum. 2. Bilateral hypermetabolic adrenal metastasis. 3. Focal area of increased uptake within the body of the pancreas without corresponding CT abnormality is nonspecific. Cannot rule out metastatic disease to the pancreas however. 4. Changes of external beam radiation noted within the mid left lung. The primary lung lesion no longer measurable. 5. Aortic Atherosclerosis (ICD10-I70.0) and Emphysema (ICD10-J43.9). 6. Infrarenal abdominal aortic aneurysm measures 3.8 cm. Recommend followup by ultrasound in 2 years. This recommendation follows ACR consensus guidelines: White Paper of the ACR Incidental Findings Committee II on Vascular Findings. J Am Coll Radiol 2013; 10:789-794. Aortic aneurysm NOS (ICD10-I71.9). Electronically Signed   By: Kerby Moors M.D.   On: 01/22/2019 10:00   CT Biopsy  Result Date: 01/26/2019 CLINICAL DATA:  Lung carcinoma. Hypermetabolic bilateral adrenal lesions on PET-CT EXAM: CT GUIDED CORE BIOPSY OF LEFT RENAL LESION ANESTHESIA/SEDATION: Intravenous Fentanyl 137mcg and Versed 2mg  were administered as conscious sedation during continuous monitoring of the patient's level of consciousness and physiological / cardiorespiratory status by the radiology RN, with a total moderate sedation time of 25 minutes. PROCEDURE: The procedure risks, benefits, and alternatives were explained to the patient. Questions regarding the procedure were encouraged and answered. The patient understands and consents to the procedure. Initially, patient was placed prone. Scanning showed lung complicating access to adrenal glands. Patient was then placed left  lateral decubitus. Select axial scans through the thorax performed. Appropriate skin entry site was determined and marked. The operative field was prepped with chlorhexidinein a sterile fashion, and a sterile drape was applied covering the operative field. A sterile gown and sterile gloves were used for the procedure. Local anesthesia was provided with 1% Lidocaine. Under CT fluoroscopic guidance, a 17 gauge trocar needle was advanced to the margin of the lesion. Once needle tip position was confirmed, coaxial 18-gauge core biopsy samples were obtained, submitted in formalin to surgical pathology. The guide needle was removed. Postprocedure scans show minimal retroperitoneal hemorrhage. Patient remained asymptomatic. COMPLICATIONS: None immediate FINDINGS: Left adrenal nodule was localized. Representative core biopsy samples obtained as above. IMPRESSION: 1. Technically successful CT-guided core biopsy, left adrenal nodule Electronically Signed   By: Lucrezia Europe M.D.   On: 01/26/2019 11:03  ASSESSMENT/PLAN:  This is a very pleasant 74 year old Caucasian male diagnosed with unresectable stage IIb non-small cell lung cancer, adenocarcinoma.  He presented with a right upper lobe lung nodule in addition to right hilar lymphadenopathy.  He was diagnosed in February 2020.  He is status post 7 cycles of concurrent chemoradiation with carboplatin for an AUC of 2 and paclitaxel 45 mg/m.  He had been on observation until December 2020 when routine his CT scan of the chest, abdomen, and pelvis showed evidence of bilateral adrenal metastases which were biopsy-proven to be metastatic adenocarcinoma of the lung.  The patient is currently undergoing systemic chemotherapy with carboplatin for an AUC of 5, Alimta 500 mg/m2, and Keytruda 200 mg IV every 3 weeks.  He is expected to receive his first dose today.   Labs were reviewed.  Recommend that he proceed with cycle #1 today as scheduled.  We will see the patient  back for follow-up visit in 1 week for evaluation and to manage any adverse side effects of treatment.  The patient has reported poor venous access. Discussed the risks and benefits of a port-a-cath. The patient would like to think about this before making a decision.   The patient's wife inquired about whether the patient may take a multivitamin. Discussed that that is alright from out standpoint to take a multivitamin. The patient's wife also asked about his PVD and ankle fracture. I recommended that he continue with close follow up with his other specialties as indicated for continued management of those conditions. We discussed that chemotherapy can drop his WBC and that we will be monitoring his blood work closely every week. Reiterated signs and symptoms of infection including fevers, chills, erythema, swelling, pain, purulent drainage, etc.   The patient was advised to call immediately if he has any concerning symptoms in the interval. The patient voices understanding of current disease status and treatment options and is in agreement with the current care plan. All questions were answered. The patient knows to call the clinic with any problems, questions or concerns. We can certainly see the patient much sooner if necessary   No orders of the defined types were placed in this encounter.    Seth Tenbrink L Ulrich Soules, PA-C 02/18/19

## 2019-02-18 ENCOUNTER — Other Ambulatory Visit: Payer: Self-pay

## 2019-02-18 ENCOUNTER — Inpatient Hospital Stay (HOSPITAL_BASED_OUTPATIENT_CLINIC_OR_DEPARTMENT_OTHER): Payer: Medicare Other | Admitting: Physician Assistant

## 2019-02-18 ENCOUNTER — Inpatient Hospital Stay: Payer: Medicare Other

## 2019-02-18 ENCOUNTER — Encounter: Payer: Self-pay | Admitting: Physician Assistant

## 2019-02-18 ENCOUNTER — Encounter: Payer: Self-pay | Admitting: Internal Medicine

## 2019-02-18 VITALS — BP 125/74 | HR 74 | Temp 98.9°F | Resp 18 | Ht 73.0 in | Wt 205.3 lb

## 2019-02-18 DIAGNOSIS — I700 Atherosclerosis of aorta: Secondary | ICD-10-CM | POA: Diagnosis not present

## 2019-02-18 DIAGNOSIS — I714 Abdominal aortic aneurysm, without rupture: Secondary | ICD-10-CM | POA: Diagnosis not present

## 2019-02-18 DIAGNOSIS — Z888 Allergy status to other drugs, medicaments and biological substances status: Secondary | ICD-10-CM | POA: Diagnosis not present

## 2019-02-18 DIAGNOSIS — Z5111 Encounter for antineoplastic chemotherapy: Secondary | ICD-10-CM

## 2019-02-18 DIAGNOSIS — C3492 Malignant neoplasm of unspecified part of left bronchus or lung: Secondary | ICD-10-CM

## 2019-02-18 DIAGNOSIS — R21 Rash and other nonspecific skin eruption: Secondary | ICD-10-CM | POA: Diagnosis not present

## 2019-02-18 DIAGNOSIS — I10 Essential (primary) hypertension: Secondary | ICD-10-CM | POA: Diagnosis not present

## 2019-02-18 DIAGNOSIS — R197 Diarrhea, unspecified: Secondary | ICD-10-CM | POA: Diagnosis not present

## 2019-02-18 DIAGNOSIS — R630 Anorexia: Secondary | ICD-10-CM | POA: Diagnosis not present

## 2019-02-18 DIAGNOSIS — R0609 Other forms of dyspnea: Secondary | ICD-10-CM | POA: Diagnosis not present

## 2019-02-18 DIAGNOSIS — Z79899 Other long term (current) drug therapy: Secondary | ICD-10-CM | POA: Diagnosis not present

## 2019-02-18 DIAGNOSIS — Z5112 Encounter for antineoplastic immunotherapy: Secondary | ICD-10-CM | POA: Diagnosis not present

## 2019-02-18 DIAGNOSIS — R0981 Nasal congestion: Secondary | ICD-10-CM | POA: Diagnosis not present

## 2019-02-18 DIAGNOSIS — I2510 Atherosclerotic heart disease of native coronary artery without angina pectoris: Secondary | ICD-10-CM | POA: Diagnosis not present

## 2019-02-18 DIAGNOSIS — R590 Localized enlarged lymph nodes: Secondary | ICD-10-CM | POA: Diagnosis not present

## 2019-02-18 DIAGNOSIS — I739 Peripheral vascular disease, unspecified: Secondary | ICD-10-CM | POA: Diagnosis not present

## 2019-02-18 DIAGNOSIS — I719 Aortic aneurysm of unspecified site, without rupture: Secondary | ICD-10-CM | POA: Diagnosis not present

## 2019-02-18 DIAGNOSIS — C7951 Secondary malignant neoplasm of bone: Secondary | ICD-10-CM | POA: Diagnosis not present

## 2019-02-18 DIAGNOSIS — R5383 Other fatigue: Secondary | ICD-10-CM | POA: Diagnosis not present

## 2019-02-18 DIAGNOSIS — K5900 Constipation, unspecified: Secondary | ICD-10-CM | POA: Diagnosis not present

## 2019-02-18 DIAGNOSIS — R5382 Chronic fatigue, unspecified: Secondary | ICD-10-CM

## 2019-02-18 DIAGNOSIS — R110 Nausea: Secondary | ICD-10-CM | POA: Diagnosis not present

## 2019-02-18 DIAGNOSIS — C3411 Malignant neoplasm of upper lobe, right bronchus or lung: Secondary | ICD-10-CM | POA: Diagnosis not present

## 2019-02-18 DIAGNOSIS — R58 Hemorrhage, not elsewhere classified: Secondary | ICD-10-CM | POA: Diagnosis not present

## 2019-02-18 LAB — CMP (CANCER CENTER ONLY)
ALT: <6 U/L (ref 0–44)
AST: 11 U/L — ABNORMAL LOW (ref 15–41)
Albumin: 3.7 g/dL (ref 3.5–5.0)
Alkaline Phosphatase: 155 U/L — ABNORMAL HIGH (ref 38–126)
Anion gap: 11 (ref 5–15)
BUN: 7 mg/dL — ABNORMAL LOW (ref 8–23)
CO2: 21 mmol/L — ABNORMAL LOW (ref 22–32)
Calcium: 8.6 mg/dL — ABNORMAL LOW (ref 8.9–10.3)
Chloride: 103 mmol/L (ref 98–111)
Creatinine: 0.98 mg/dL (ref 0.61–1.24)
GFR, Est AFR Am: >60 mL/min (ref >60)
GFR, Estimated: >60 mL/min (ref >60)
Glucose, Bld: 105 mg/dL — ABNORMAL HIGH (ref 70–99)
Potassium: 4.2 mmol/L (ref 3.5–5.1)
Sodium: 135 mmol/L (ref 135–145)
Total Bilirubin: 0.8 mg/dL (ref 0.3–1.2)
Total Protein: 6.8 g/dL (ref 6.5–8.1)

## 2019-02-18 LAB — CBC WITH DIFFERENTIAL (CANCER CENTER ONLY)
Abs Immature Granulocytes: 0.04 10*3/uL (ref 0.00–0.07)
Basophils Absolute: 0.1 10*3/uL (ref 0.0–0.1)
Basophils Relative: 1 %
Eosinophils Absolute: 0.2 10*3/uL (ref 0.0–0.5)
Eosinophils Relative: 3 %
HCT: 42.2 % (ref 39.0–52.0)
Hemoglobin: 14.1 g/dL (ref 13.0–17.0)
Immature Granulocytes: 1 %
Lymphocytes Relative: 10 %
Lymphs Abs: 0.9 10*3/uL (ref 0.7–4.0)
MCH: 30.1 pg (ref 26.0–34.0)
MCHC: 33.4 g/dL (ref 30.0–36.0)
MCV: 90.2 fL (ref 80.0–100.0)
Monocytes Absolute: 0.7 10*3/uL (ref 0.1–1.0)
Monocytes Relative: 9 %
Neutro Abs: 6.6 10*3/uL (ref 1.7–7.7)
Neutrophils Relative %: 76 %
Platelet Count: 218 10*3/uL (ref 150–400)
RBC: 4.68 MIL/uL (ref 4.22–5.81)
RDW: 14.3 % (ref 11.5–15.5)
WBC Count: 8.6 10*3/uL (ref 4.0–10.5)
nRBC: 0 % (ref 0.0–0.2)

## 2019-02-18 LAB — TSH: TSH: 1.274 u[IU]/mL (ref 0.320–4.118)

## 2019-02-18 MED ORDER — SODIUM CHLORIDE 0.9 % IV SOLN
200.0000 mg | Freq: Once | INTRAVENOUS | Status: AC
  Administered 2019-02-18: 200 mg via INTRAVENOUS
  Filled 2019-02-18: qty 8

## 2019-02-18 MED ORDER — SODIUM CHLORIDE 0.9 % IV SOLN
150.0000 mg | Freq: Once | INTRAVENOUS | Status: AC
  Administered 2019-02-18: 150 mg via INTRAVENOUS
  Filled 2019-02-18: qty 5

## 2019-02-18 MED ORDER — DIPHENHYDRAMINE HCL 50 MG/ML IJ SOLN
INTRAMUSCULAR | Status: AC
  Filled 2019-02-18: qty 1

## 2019-02-18 MED ORDER — SODIUM CHLORIDE 0.9 % IV SOLN
558.5000 mg | Freq: Once | INTRAVENOUS | Status: AC
  Administered 2019-02-18: 560 mg via INTRAVENOUS
  Filled 2019-02-18: qty 56

## 2019-02-18 MED ORDER — SODIUM CHLORIDE 0.9 % IV SOLN
500.0000 mg/m2 | Freq: Once | INTRAVENOUS | Status: AC
  Administered 2019-02-18: 1100 mg via INTRAVENOUS
  Filled 2019-02-18: qty 4

## 2019-02-18 MED ORDER — PALONOSETRON HCL INJECTION 0.25 MG/5ML
INTRAVENOUS | Status: AC
  Filled 2019-02-18: qty 5

## 2019-02-18 MED ORDER — DEXAMETHASONE SODIUM PHOSPHATE 10 MG/ML IJ SOLN
INTRAMUSCULAR | Status: AC
  Filled 2019-02-18: qty 1

## 2019-02-18 MED ORDER — SODIUM CHLORIDE 0.9 % IV SOLN
Freq: Once | INTRAVENOUS | Status: AC
  Filled 2019-02-18: qty 250

## 2019-02-18 MED ORDER — DIPHENHYDRAMINE HCL 50 MG/ML IJ SOLN
25.0000 mg | Freq: Once | INTRAMUSCULAR | Status: AC
  Administered 2019-02-18: 25 mg via INTRAVENOUS

## 2019-02-18 MED ORDER — DEXAMETHASONE SODIUM PHOSPHATE 10 MG/ML IJ SOLN
5.0000 mg | Freq: Once | INTRAMUSCULAR | Status: AC
  Administered 2019-02-18: 5 mg via INTRAVENOUS

## 2019-02-18 MED ORDER — PALONOSETRON HCL INJECTION 0.25 MG/5ML
0.2500 mg | Freq: Once | INTRAVENOUS | Status: AC
  Administered 2019-02-18: 0.25 mg via INTRAVENOUS

## 2019-02-18 NOTE — Progress Notes (Signed)
Met w/ pt to introduce myself as his Arboriculturist.  Pt has 2 insurances so copay assistance shouldn't be needed.  I offered the [place], went over what it covers and gave him the income requirement.  Pt would like to review his income with his wife and will contact me if he would like to apply.  He has my card to do so.

## 2019-02-18 NOTE — Patient Instructions (Addendum)
[facility] Discharge Instructions for Patients Receiving Chemotherapy  Today you received the following chemotherapy agents Keytruda, Alimta, Carboplatin.  To help prevent nausea and vomiting after your treatment, we encourage you to take your nausea medication DO NOT TAKE ZOFRAN FOR THREE DAYS AFTER TREATMENT.   If you develop nausea and vomiting that is not controlled by your nausea medication, call the clinic.   BELOW ARE SYMPTOMS THAT SHOULD BE REPORTED IMMEDIATELY:  *FEVER GREATER THAN 100.5 F  *CHILLS WITH OR WITHOUT FEVER  NAUSEA AND VOMITING THAT IS NOT CONTROLLED WITH YOUR NAUSEA MEDICATION  *UNUSUAL SHORTNESS OF BREATH  *UNUSUAL BRUISING OR BLEEDING  TENDERNESS IN MOUTH AND THROAT WITH OR WITHOUT PRESENCE OF ULCERS  *URINARY PROBLEMS  *BOWEL PROBLEMS  UNUSUAL RASH Items with * indicate a potential emergency and should be followed up as soon as possible.  Feel free to call the clinic should you have any questions or concerns. The clinic phone number is [phone].  Please show the [place] at check-in to the Emergency Department and triage nurse.  Pembrolizumab injection What is this medicine? PEMBROLIZUMAB (pem broe liz ue mab) is a monoclonal antibody. It is used to treat certain types of cancer. This medicine may be used for other purposes; ask your health care provider or pharmacist if you have questions. COMMON BRAND NAME(S): Keytruda What should I tell my health care provider before I take this medicine? They need to know if you have any of these conditions:  diabetes  immune system problems  inflammatory bowel disease  liver disease  lung or breathing disease  lupus  received or scheduled to receive an organ transplant or a stem-cell transplant that uses donor stem cells  an unusual or allergic reaction to pembrolizumab, other medicines, foods, dyes, or preservatives  pregnant or trying to get  pregnant  breast-feeding How should I use this medicine? This medicine is for infusion into a vein. It is given by a health care professional in a hospital or clinic setting. A special MedGuide will be given to you before each treatment. Be sure to read this information carefully each time. Talk to your pediatrician regarding the use of this medicine in children. While this drug may be prescribed for children as young as 6 months for selected conditions, precautions do apply. Overdosage: If you think you have taken too much of this medicine contact a poison control center or emergency room at once. NOTE: This medicine is only for you. Do not share this medicine with others. What if I miss a dose? It is important not to miss your dose. Call your doctor or health care professional if you are unable to keep an appointment. What may interact with this medicine? Interactions have not been studied. Give your health care provider a list of all the medicines, herbs, non-prescription drugs, or dietary supplements you use. Also tell them if you smoke, drink alcohol, or use illegal drugs. Some items may interact with your medicine. This list may not describe all possible interactions. Give your health care provider a list of all the medicines, herbs, non-prescription drugs, or dietary supplements you use. Also tell them if you smoke, drink alcohol, or use illegal drugs. Some items may interact with your medicine. What should I watch for while using this medicine? Your condition will be monitored carefully while you are receiving this medicine. You may need blood work done while you are taking this medicine. Do not become pregnant while taking this medicine  or for 4 months after stopping it. Women should inform their doctor if they wish to become pregnant or think they might be pregnant. There is a potential for serious side effects to an unborn child. Talk to your health care professional or pharmacist for  more information. Do not breast-feed an infant while taking this medicine or for 4 months after the last dose. What side effects may I notice from receiving this medicine? Side effects that you should report to your doctor or health care professional as soon as possible:  allergic reactions like skin rash, itching or hives, swelling of the face, lips, or tongue  bloody or black, tarry  breathing problems  changes in vision  chest pain  chills  confusion  constipation  cough  diarrhea  dizziness or feeling faint or lightheaded  fast or irregular heartbeat  fever  flushing  joint pain  low blood counts - this medicine may decrease the number of white blood cells, red blood cells and platelets. You may be at increased risk for infections and bleeding.  muscle pain  muscle weakness  pain, tingling, numbness in the hands or feet  persistent headache  redness, blistering, peeling or loosening of the skin, including inside the mouth  signs and symptoms of high blood sugar such as dizziness; dry mouth; dry skin; fruity breath; nausea; stomach pain; increased hunger or thirst; increased urination  signs and symptoms of kidney injury like trouble passing urine or change in the amount of urine  signs and symptoms of liver injury like dark urine, light-colored stools, loss of appetite, nausea, right upper belly pain, yellowing of the eyes or skin  sweating  swollen lymph nodes  weight loss Side effects that usually do not require medical attention (report to your doctor or health care professional if they continue or are bothersome):  decreased appetite  hair loss  muscle pain  tiredness This list may not describe all possible side effects. Call your doctor for medical advice about side effects. You may report side effects to FDA at 1-800-FDA-1088. Where should I keep my medicine? This drug is given in a hospital or clinic and will not be stored at home. NOTE:  This sheet is a summary. It may not cover all possible information. If you have questions about this medicine, talk to your doctor, pharmacist, or health care provider.  2020 Elsevier/Gold Standard (2018-11-28 18:07:58)

## 2019-02-19 ENCOUNTER — Other Ambulatory Visit: Payer: Self-pay | Admitting: Family Medicine

## 2019-02-19 ENCOUNTER — Telehealth: Payer: Self-pay | Admitting: Medical Oncology

## 2019-02-19 DIAGNOSIS — I739 Peripheral vascular disease, unspecified: Secondary | ICD-10-CM

## 2019-02-19 NOTE — Telephone Encounter (Signed)
Red face ( again) , indigestion today  . Sudden back pain last night .  Keytruda.alimta and carbo yesterday.   red face - He took benadryl this am and I instructed him to continue benadryl.   Indigestion-recommended tums prn . He is on twice daily Pepcid and zyrtec.  For back pain -I told him it may be related to wearing surgical boot and to monitor pain . I instructed him to call back if pain worsens .

## 2019-02-20 ENCOUNTER — Ambulatory Visit (INDEPENDENT_AMBULATORY_CARE_PROVIDER_SITE_OTHER): Payer: Medicare Other | Admitting: Family Medicine

## 2019-02-20 ENCOUNTER — Encounter (HOSPITAL_COMMUNITY): Payer: Self-pay | Admitting: Internal Medicine

## 2019-02-20 ENCOUNTER — Other Ambulatory Visit: Payer: Self-pay

## 2019-02-20 VITALS — BP 109/69 | HR 74 | Temp 98.2°F | Ht 73.0 in | Wt 203.0 lb

## 2019-02-20 DIAGNOSIS — R600 Localized edema: Secondary | ICD-10-CM

## 2019-02-20 DIAGNOSIS — I739 Peripheral vascular disease, unspecified: Secondary | ICD-10-CM

## 2019-02-20 DIAGNOSIS — I872 Venous insufficiency (chronic) (peripheral): Secondary | ICD-10-CM | POA: Diagnosis not present

## 2019-02-20 DIAGNOSIS — C3490 Malignant neoplasm of unspecified part of unspecified bronchus or lung: Secondary | ICD-10-CM | POA: Diagnosis not present

## 2019-02-20 NOTE — Patient Instructions (Addendum)
  Continue to can use compressive stockings, elevate legs when seated as possible.  Apply Eucerin or other hydrating lotion to any dry areas of the skin.  If you noticed any weeping areas of skin, ulcers, or drainage, be evaluated right away.  Follow-up in 1 month for a virtual/video visit but I am happy to evaluate you sooner if needed.  Good luck with your other treatment and please let me know if I can help.  Thank you for coming in today.   COVID-19 Vaccine Information can be found at: [email or web address].uk For questions related to vaccine distribution or appointments, please email [email or web address] or call [phone].     If you have lab work done today you will be contacted with your lab results within the next 2 weeks.  If you have not heard from [place] then please contact us. The fastest way to get your results is to register for My Chart.   IF you received an x-ray today, you will receive an invoice from [facility]. Please contact [facility] at [phone] with questions or concerns regarding your invoice.   IF you received labwork today, you will receive an invoice from [place]. Please contact [facility] at [phone] with questions or concerns regarding your invoice.   Our billing staff will not be able to assist you with questions regarding bills from these companies.  You will be contacted with the lab results as soon as they are available. The fastest way to get your results is to activate your My Chart account. Instructions are located on the last page of this paperwork. If you have not heard from [place] regarding the results in 2 weeks, please contact this office.

## 2019-02-20 NOTE — Progress Notes (Signed)
Subjective:  Patient ID: Seth [name], male    DOB: Nov 29, 1945  Age: 74 y.o. MRN: [id number]  CC:  Chief Complaint  Patient presents with  . Follow-up    on sore on L ankel from last month. pt states it has healed. pt states he keeps it bandaged to keep his boot from rubbing what the pt thinks will be pretty thin skin over the healed area.R leg is doing better not as much swelling since last visit.    HPI Seth [name] Theys presents for   ast visit 01/27/19 - telemed with Dr. Pamella Pert.  Had been treated with lasix, doxycycline,.  Blisters were healing.  Less swelling with less salt and leg elevation. Able to sleep in bed instead of recliner.   Follow up on left ankle wound.  Wound is improving.  Healed up after neosporin.  No other new wounds/ulcers.  Applying antibacterial lotion. Once per day. Has eucerin.  Swelling much better than last visit.   Still some pain in broken left ankle- will be seeing ortho in few days - may be removing boot - not sure.   Now going through chemotherapy for lung CA.    History Patient Active Problem List   Diagnosis Date Noted  . Adenocarcinoma of left lung, stage 4 ([place]) 02/03/2019  . Encounter for antineoplastic immunotherapy 02/03/2019  . Frequent nosebleeds 05/28/2018  . Encounter for antineoplastic chemotherapy 04/18/2018  . Goals of care, counseling/discussion 04/18/2018  . Adenocarcinoma of left lung, stage 2 ([place]) 03/27/2018  . Abnormal PET of left lung   . S/P bronchoscopy   . Solitary lung nodule 02/20/2018  . Hilar adenopathy 02/20/2018  . Abnormal findings on diagnostic imaging of lung 02/20/2018  . Platelet inhibition due to Plavix 02/20/2018  . PVD (peripheral vascular disease) ([place]) 10/15/2013  . Tobacco use disorder 10/15/2013  . Pure hypercholesterolemia 10/15/2013  . Coronary artery disease involving native coronary artery of native heart without angina pectoris 10/15/2013  . Cataract 09/29/2013  . Colon cancer screening  09/25/2013  . AAA (abdominal aortic aneurysm) without rupture ([place]) 10/22/2012  . Nicotine addiction 10/17/2012  . Atherosclerosis of native arteries of the extremities with ulceration(440.23) 10/17/2012  . Carotid bruit 10/17/2012  . Other nonspecific abnormal cardiovascular system function study 04/10/2012  . History of sciatica   . Essential hypertension, benign   . Seasonal allergies    Past Medical History:  Diagnosis Date  . AAA (abdominal aortic aneurysm) ([place])   . Allergic rhinitis   . Anxiety   . Back pain   . CAD (coronary artery disease)   . Cataracts, bilateral   . Emphysema, unspecified ([place])     " moderate"  . Essential hypertension, benign   . GERD (gastroesophageal reflux disease)   . Gout   . Headache    migraines  . Heart murmur    as a child only  . Hilar adenopathy   . History of hiatal hernia   . History of sciatica   . Hyperlipidemia   . lung ca dx'd 02/2018  . Lung nodule   . Pneumonia    as child  . Prostate hypertrophy   . PVD (peripheral vascular disease) ([place])   . Seasonal allergies   . Subclavian steal syndrome of left subclavian artery   . Wears dentures   . Wears dentures    Past Surgical History:  Procedure Laterality Date  . CARDIAC CATHETERIZATION     stent placement  . COLONOSCOPY W/ BIOPSIES AND  POLYPECTOMY    . ILIAC ARTERY STENT    . MULTIPLE TOOTH EXTRACTIONS    . VIDEO BRONCHOSCOPY WITH ENDOBRONCHIAL NAVIGATION N/A 02/26/2018   Procedure: VIDEO BRONCHOSCOPY WITH ENDOBRONCHIAL NAVIGATION;  Surgeon: Garner Nash, DO;  Location: [facility];  Service: Thoracic;  Laterality: N/A;  . VIDEO BRONCHOSCOPY WITH ENDOBRONCHIAL NAVIGATION N/A 03/19/2018   Procedure: VIDEO BRONCHOSCOPY WITH ENDOBRONCHIAL NAVIGATION and endobronchial ultrasound;  Surgeon: Garner Nash, DO;  Location: [facility];  Service: Thoracic;  Laterality: N/A;  . VIDEO BRONCHOSCOPY WITH ENDOBRONCHIAL ULTRASOUND N/A 02/26/2018   Procedure: VIDEO BRONCHOSCOPY WITH  ENDOBRONCHIAL ULTRASOUND;  Surgeon: Garner Nash, DO;  Location: [facility] [place];  Service: Thoracic;  Laterality: N/A;  . VIDEO BRONCHOSCOPY WITH ENDOBRONCHIAL ULTRASOUND N/A 03/19/2018   Procedure: NAVIGATION BRONCHOSCOPY;  Surgeon: Garner Nash, DO;  [facility];  Service: Thoracic;  Laterality: N/A;   Allergies  Allergen Reactions  . Biaxin [Clarithromycin] Shortness Of Breath  . Clarithromycin Shortness Of Breath  . Iodinated Diagnostic Agents Hives  . Prednisone Other (See Comments)    Patient states he turns REALLY RED all over.  . Cortisone Other (See Comments)    Turns red from breast up  . Flomax [Tamsulosin Hcl]     Blurred vision and lower back pain   . Hepatitis B Virus Vaccines     Nerve problems States his previous doctor told him it was due to this   . Iodine Hives    Dye from nuclear medicine test   Prior to Admission medications   Medication Sig Start Date End Date Taking? Authorizing Provider  atorvastatin (LIPITOR) 40 MG tablet Take 1 tablet (40 mg total) by mouth daily. 04/09/18 04/09/19 Yes Turner, Eber Hong, MD  cetirizine (ZYRTEC) 10 MG tablet Take 10 mg by mouth daily at 12 noon.   Yes [provider]  clopidogrel (PLAVIX) 75 MG tablet TAKE 1 TABLET BY MOUTH EVERY DAY 02/19/19  Yes [name], MD  famotidine (PEPCID) 20 MG tablet Take 20 mg by mouth 2 (two) times daily. Noon & in the evening.   Yes [provider]  folic acid (FOLVITE) 1 MG tablet Take 1 tablet (1 mg total) by mouth daily. 02/03/19  Yes Curt Bears, MD  lisinopril (ZESTRIL) 10 MG tablet Take 1 tablet (10 mg total) by mouth daily. 09/18/18  Yes [name], MD  prochlorperazine (COMPAZINE) 10 MG tablet Take 1 tablet (10 mg total) by mouth every 6 (six) hours as needed for nausea or vomiting. 02/03/19  Yes Curt Bears, MD  terbinafine (LAMISIL) 1 % cream Apply 1 application topically 2 (two) times daily.   Yes [provider]  terbinafine (LAMISIL) 250  MG tablet Take 250 mg by mouth daily.    [provider]   Social History   Socioeconomic History  . Marital status: Married    Spouse name: Not on file  . Number of children: 1  . Years of education: Not on file  . Highest education level: Not on file  Occupational History  . Occupation: retired  Tobacco Use  . Smoking status: Former Smoker    Packs/day: 3.00    Years: 50.00    Pack years: 150.00    Types: Cigarettes    Quit date: 04/05/2016    Years since quitting: 2.8  . Smokeless tobacco: Never Used  Substance and Sexual Activity  . Alcohol use: Yes    Alcohol/week: 0.0 standard drinks    Comment: rare  .  Drug use: No  . Sexual activity: Not on file  Other Topics Concern  . Not on file  Social History Narrative   Married   Exercise: No   Education: GED   Social Determinants of Radio broadcast assistant Strain:   . Difficulty of Paying Living Expenses: Not on file  Food Insecurity:   . Worried About Charity fundraiser in the Last Year: Not on file  . Ran Out of Food in the Last Year: Not on file  Transportation Needs:   . Lack of Transportation (Medical): Not on file  . Lack of Transportation (Non-Medical): Not on file  Physical Activity:   . Days of Exercise per Week: Not on file  . Minutes of Exercise per Session: Not on file  Stress:   . Feeling of Stress : Not on file  Social Connections:   . Frequency of Communication with Friends and Family: Not on file  . Frequency of Social Gatherings with Friends and Family: Not on file  . Attends Religious Services: Not on file  . Active Member of Clubs or Organizations: Not on file  . Attends Archivist Meetings: Not on file  . Marital Status: Not on file  Intimate Partner Violence:   . Fear of Current or Ex-Partner: Not on file  . Emotionally Abused: Not on file  . Physically Abused: Not on file  . Sexually Abused: Not on file    Review of Systems  No fever, less swelling. No leg  wounds.  Ankle pain.  Objective:   Vitals:   02/20/19 1646  BP: 109/69  Pulse: 74  Temp: 98.2 F (36.8 C)  TempSrc: Temporal  SpO2: 97%  Weight: 203 lb (92.1 kg)  Height: 6\' 1"  (1.854 m)     Physical Exam Vitals reviewed.  Constitutional:      General: He is not in acute distress.    Appearance: He is well-developed.  HENT:     Head: Normocephalic and atraumatic.  Cardiovascular:     Rate and Rhythm: Normal rate.  Pulmonary:     Effort: Pulmonary effort is normal.  Musculoskeletal:     Right lower leg: Edema (1-2+ pedal edema right lower extremity extending to proximal third of tibia, no focal calf tenderness, slight erythema distal right lower leg/ankle without wounds, pain, discharge.) present.     Left lower leg: Edema (Slight dry skin at the left foot, no appreciable pedal edema in area of Cam walker, minimal edema just proximal.  Calf nontender.  No wounds.  Healed skin left lower extremity.) present.  Neurological:     Mental Status: He is alert and oriented to person, place, and time.        Assessment & Plan:  JAIMERE FEUTZ is a 74 y.o. male . Pedal edema  PVD (peripheral vascular disease) (HCC)  Malignant neoplasm of lung, unspecified laterality, unspecified part of lung (HCC)  Venous stasis dermatitis of right lower extremity  Previous wounds improved, no new ulcerations seen.  Appears to have component of stasis dermatitis on right lower leg without acute infection apparent.  Elevation, compressive stockings discussed.  Hold on diuretic at this time.  Hydrating lotion to areas of dry skin discussed.  RTC precautions given.  Option of virtual/telemedicine visit discussed, especially with chemotherapy for lung malignancy.  No orders of the defined types were placed in this encounter.  Patient Instructions    Continue to can use compressive stockings, elevate legs when seated as possible.  Apply Eucerin or other hydrating lotion to any dry areas of the  skin.  If you noticed any weeping areas of skin, ulcers, or drainage, be evaluated right away.  Follow-up in 1 month for a virtual/video visit but I am happy to evaluate you sooner if needed.  Good luck with your other treatment and please let me know if I can help.  Thank you for coming in today.   COVID-19 Vaccine Information can be found at: ShippingScam.co.uk For questions related to vaccine distribution or appointments, please email [email or web address] or call [phone].     If you have lab work done today you will be contacted with your lab results within the next 2 weeks.  If you have not heard from [place] then please contact us. The fastest way to get your results is to register for My Chart.   IF you received an x-ray today, you will receive an invoice from [facility]. Please contact [facility] Radiology at [phone] with questions or concerns regarding your invoice.   IF you received labwork today, you will receive an invoice from [place]. Please contact [facility] at [phone] with questions or concerns regarding your invoice.   Our billing staff will not be able to assist you with questions regarding bills from these companies.  You will be contacted with the lab results as soon as they are available. The fastest way to get your results is to activate your My Chart account. Instructions are located on the last page of this paperwork. If you have not heard from [place] regarding the results in 2 weeks, please contact this office.         Signed, [name], MD [facility]

## 2019-02-21 ENCOUNTER — Encounter: Payer: Self-pay | Admitting: Family Medicine

## 2019-02-23 DIAGNOSIS — L509 Urticaria, unspecified: Secondary | ICD-10-CM | POA: Diagnosis not present

## 2019-02-23 DIAGNOSIS — J300 Vasomotor rhinitis: Secondary | ICD-10-CM | POA: Diagnosis not present

## 2019-02-23 DIAGNOSIS — S82832D Other fracture of upper and lower end of left fibula, subsequent encounter for closed fracture with routine healing: Secondary | ICD-10-CM | POA: Diagnosis not present

## 2019-02-23 DIAGNOSIS — R21 Rash and other nonspecific skin eruption: Secondary | ICD-10-CM | POA: Diagnosis not present

## 2019-02-24 ENCOUNTER — Inpatient Hospital Stay: Payer: Medicare Other

## 2019-02-24 ENCOUNTER — Inpatient Hospital Stay (HOSPITAL_BASED_OUTPATIENT_CLINIC_OR_DEPARTMENT_OTHER): Payer: Medicare Other | Admitting: Internal Medicine

## 2019-02-24 ENCOUNTER — Encounter: Payer: Self-pay | Admitting: Internal Medicine

## 2019-02-24 ENCOUNTER — Other Ambulatory Visit: Payer: Self-pay

## 2019-02-24 VITALS — BP 119/66 | HR 81 | Temp 98.7°F | Resp 18 | Ht 73.0 in | Wt 200.2 lb

## 2019-02-24 DIAGNOSIS — Z5111 Encounter for antineoplastic chemotherapy: Secondary | ICD-10-CM | POA: Diagnosis not present

## 2019-02-24 DIAGNOSIS — R590 Localized enlarged lymph nodes: Secondary | ICD-10-CM | POA: Diagnosis not present

## 2019-02-24 DIAGNOSIS — C7951 Secondary malignant neoplasm of bone: Secondary | ICD-10-CM | POA: Diagnosis not present

## 2019-02-24 DIAGNOSIS — C3492 Malignant neoplasm of unspecified part of left bronchus or lung: Secondary | ICD-10-CM | POA: Diagnosis not present

## 2019-02-24 DIAGNOSIS — I10 Essential (primary) hypertension: Secondary | ICD-10-CM | POA: Diagnosis not present

## 2019-02-24 DIAGNOSIS — Z5112 Encounter for antineoplastic immunotherapy: Secondary | ICD-10-CM

## 2019-02-24 DIAGNOSIS — R0981 Nasal congestion: Secondary | ICD-10-CM | POA: Diagnosis not present

## 2019-02-24 DIAGNOSIS — C3411 Malignant neoplasm of upper lobe, right bronchus or lung: Secondary | ICD-10-CM | POA: Diagnosis not present

## 2019-02-24 LAB — CBC WITH DIFFERENTIAL (CANCER CENTER ONLY)
Abs Immature Granulocytes: 0.02 10*3/uL (ref 0.00–0.07)
Basophils Absolute: 0 10*3/uL (ref 0.0–0.1)
Basophils Relative: 0 %
Eosinophils Absolute: 0.2 10*3/uL (ref 0.0–0.5)
Eosinophils Relative: 3 %
HCT: 40.4 % (ref 39.0–52.0)
Hemoglobin: 13.3 g/dL (ref 13.0–17.0)
Immature Granulocytes: 0 %
Lymphocytes Relative: 11 %
Lymphs Abs: 0.6 10*3/uL — ABNORMAL LOW (ref 0.7–4.0)
MCH: 29 pg (ref 26.0–34.0)
MCHC: 32.9 g/dL (ref 30.0–36.0)
MCV: 88.2 fL (ref 80.0–100.0)
Monocytes Absolute: 0.1 10*3/uL (ref 0.1–1.0)
Monocytes Relative: 1 %
Neutro Abs: 4.7 10*3/uL (ref 1.7–7.7)
Neutrophils Relative %: 85 %
Platelet Count: 171 10*3/uL (ref 150–400)
RBC: 4.58 MIL/uL (ref 4.22–5.81)
RDW: 13.8 % (ref 11.5–15.5)
WBC Count: 5.5 10*3/uL (ref 4.0–10.5)
nRBC: 0 % (ref 0.0–0.2)

## 2019-02-24 LAB — CMP (CANCER CENTER ONLY)
ALT: 8 U/L (ref 0–44)
AST: 10 U/L — ABNORMAL LOW (ref 15–41)
Albumin: 3.5 g/dL (ref 3.5–5.0)
Alkaline Phosphatase: 110 U/L (ref 38–126)
Anion gap: 9 (ref 5–15)
BUN: 17 mg/dL (ref 8–23)
CO2: 24 mmol/L (ref 22–32)
Calcium: 8.6 mg/dL — ABNORMAL LOW (ref 8.9–10.3)
Chloride: 101 mmol/L (ref 98–111)
Creatinine: 1.09 mg/dL (ref 0.61–1.24)
GFR, Est AFR Am: >60 mL/min (ref >60)
GFR, Estimated: >60 mL/min (ref >60)
Glucose, Bld: 104 mg/dL — ABNORMAL HIGH (ref 70–99)
Potassium: 4.6 mmol/L (ref 3.5–5.1)
Sodium: 134 mmol/L — ABNORMAL LOW (ref 135–145)
Total Bilirubin: 0.7 mg/dL (ref 0.3–1.2)
Total Protein: 6.6 g/dL (ref 6.5–8.1)

## 2019-02-24 NOTE — Progress Notes (Signed)
[place] [phone]   [phone]  OFFICE PROGRESS NOTE  [name], MD [place] [address]  DIAGNOSIS: Metastatic non-small cell lung cancer, adenocarcinoma initially diagnosed as stage IIB (T1c, N1, M0) non-small cell lung cancer favoring adenocarcinoma presented with right upper lobe lung nodule in addition to right hilar lymphadenopathy diagnosed in February 2020.  The patient is not a good surgical candidate for resection.  He had evidence for disease recurrence in December 2020 with adrenal gland metastasis.  PRIOR THERAPY: Concurrent chemoradiation with weekly carboplatin for AUC of 2 and paclitaxel 45 mg/M2.  First dose May 05, 2018.  Status post 7 cycles.  Last dose was given Jun 16, 2018  CURRENT THERAPY: Systemic chemotherapy with carboplatin for AUC of 5, Alimta 500 mg/M2 and Keytruda 200 mg IV every 3 weeks.  First dose starting February 17, 2019. Status post 1 cycle.  INTERVAL HISTORY: Seth [name] 74 y.o. male returns to the clinic today for follow-up visit.  His wife was available by phone during the visit.  The patient is feeling fine today with no concerning complaints.  He tolerated the first week of his systemic chemotherapy with carboplatin, Alimta and Keytruda fairly well except for lack of appetite and he lost around 2 pounds since his last visit.  He also developed small area of his skin rash on the lower back as well as right arm.  He was seen by his allergist for his annual evaluation and he did not recommend any intervention because he is currently on chemotherapy.  The patient denied having any chest pain, shortness of breath except with exertion with no cough or hemoptysis.  He denied having any fever or chills.  He has no nausea, vomiting, diarrhea or constipation.  He has no headache or visual changes.  MEDICAL HISTORY: Past Medical History:  Diagnosis Date  . AAA (abdominal aortic aneurysm) ([place])    . Allergic rhinitis   . Anxiety   . Back pain   . CAD (coronary artery disease)   . Cataracts, bilateral   . Emphysema, unspecified ([place])     " moderate"  . Essential hypertension, benign   . GERD (gastroesophageal reflux disease)   . Gout   . Headache    migraines  . Heart murmur    as a child only  . Hilar adenopathy   . History of hiatal hernia   . History of sciatica   . Hyperlipidemia   . lung ca dx'd 02/2018  . Lung nodule   . Pneumonia    as child  . Prostate hypertrophy   . PVD (peripheral vascular disease) ([place])   . Seasonal allergies   . Subclavian steal syndrome of left subclavian artery   . Wears dentures   . Wears dentures     ALLERGIES:  is allergic to biaxin [clarithromycin]; clarithromycin; iodinated diagnostic agents; prednisone; cortisone; flomax [tamsulosin hcl]; hepatitis b virus vaccines; and iodine.  MEDICATIONS:  Current Outpatient Medications  Medication Sig Dispense Refill  . atorvastatin (LIPITOR) 40 MG tablet Take 1 tablet (40 mg total) by mouth daily. 90 tablet 3  . cetirizine (ZYRTEC) 10 MG tablet Take 10 mg by mouth daily at 12 noon.    . clopidogrel (PLAVIX) 75 MG tablet TAKE 1 TABLET BY MOUTH EVERY DAY 30 tablet 2  . famotidine (PEPCID) 20 MG tablet Take 20 mg by mouth 2 (two) times daily. Noon & in the evening.    Marland Kitchen  folic acid (FOLVITE) 1 MG tablet Take 1 tablet (1 mg total) by mouth daily. 30 tablet 4  . lisinopril (ZESTRIL) 10 MG tablet Take 1 tablet (10 mg total) by mouth daily. 90 tablet 1  . prochlorperazine (COMPAZINE) 10 MG tablet Take 1 tablet (10 mg total) by mouth every 6 (six) hours as needed for nausea or vomiting. 30 tablet 0  . terbinafine (LAMISIL) 1 % cream Apply 1 application topically 2 (two) times daily.     Current Facility-Administered Medications  Medication Dose Route Frequency Provider Last Rate Last Admin  . 0.9 %  sodium chloride infusion  500 mL Intravenous Once Irene Shipper, MD        SURGICAL HISTORY:    Past Surgical History:  Procedure Laterality Date  . CARDIAC CATHETERIZATION     stent placement  . COLONOSCOPY W/ BIOPSIES AND POLYPECTOMY    . ILIAC ARTERY STENT    . MULTIPLE TOOTH EXTRACTIONS    . VIDEO BRONCHOSCOPY WITH ENDOBRONCHIAL NAVIGATION N/A 02/26/2018   Procedure: VIDEO BRONCHOSCOPY WITH ENDOBRONCHIAL NAVIGATION;  Surgeon: Garner Nash, DO;  Location: [facility];  Service: Thoracic;  Laterality: N/A;  . VIDEO BRONCHOSCOPY WITH ENDOBRONCHIAL NAVIGATION N/A 03/19/2018   Procedure: VIDEO BRONCHOSCOPY WITH ENDOBRONCHIAL NAVIGATION and endobronchial ultrasound;  Surgeon: Garner Nash, DO;  Location: [facility];  Service: Thoracic;  Laterality: N/A;  . VIDEO BRONCHOSCOPY WITH ENDOBRONCHIAL ULTRASOUND N/A 02/26/2018   Procedure: VIDEO BRONCHOSCOPY WITH ENDOBRONCHIAL ULTRASOUND;  Surgeon: Garner Nash, DO;  Location: [facility];  Service: Thoracic;  Laterality: N/A;  . VIDEO BRONCHOSCOPY WITH ENDOBRONCHIAL ULTRASOUND N/A 03/19/2018   Procedure: NAVIGATION BRONCHOSCOPY;  Surgeon: Garner Nash, DO;  Location: [facility];  Service: Thoracic;  Laterality: N/A;    REVIEW OF SYSTEMS:  Constitutional: positive for anorexia and fatigue Eyes: negative Ears, nose, mouth, throat, and face: negative Respiratory: positive for dyspnea on exertion Cardiovascular: negative Gastrointestinal: negative Genitourinary:negative Integument/breast: positive for rash Hematologic/lymphatic: negative Musculoskeletal:negative Neurological: negative Behavioral/Psych: negative Endocrine: negative Allergic/Immunologic: negative   PHYSICAL EXAMINATION: General appearance: alert, cooperative, fatigued and no distress Head: Normocephalic, without obvious abnormality, atraumatic Neck: no adenopathy, no JVD, supple, symmetrical, trachea midline and thyroid not enlarged, symmetric, no tenderness/mass/nodules Lymph nodes: Cervical, supraclavicular, and axillary nodes normal. Resp: clear to auscultation  bilaterally Back: symmetric, no curvature. ROM normal. No CVA tenderness. Cardio: regular rate and rhythm, S1, S2 normal, no murmur, click, rub or gallop GI: soft, non-tender; bowel sounds normal; no masses,  no organomegaly Extremities: extremities normal, atraumatic, no cyanosis or edema Neurologic: Alert and oriented X 3, normal strength and tone. Normal symmetric reflexes. Normal coordination and gait  ECOG PERFORMANCE STATUS: 1 - Symptomatic but completely ambulatory  Blood pressure 119/66, pulse 81, temperature 98.7 F (37.1 C), resp. rate 18, height 6\' 1"  (1.854 m), weight 200 lb 3.2 oz (90.8 kg), SpO2 100 %.  LABORATORY DATA: Lab Results  Component Value Date   WBC 5.5 02/24/2019   HGB 13.3 02/24/2019   HCT 40.4 02/24/2019   MCV 88.2 02/24/2019   PLT 171 02/24/2019      Chemistry      Component Value Date/Time   NA 134 (L) 02/24/2019 0825   NA 133 (L) 01/19/2019 1457   K 4.6 02/24/2019 0825   CL 101 02/24/2019 0825   CO2 24 02/24/2019 0825   BUN 17 02/24/2019 0825   BUN 9 01/19/2019 1457   CREATININE 1.09 02/24/2019 0825   CREATININE 1.09 03/23/2015 6440  Component Value Date/Time   CALCIUM 8.6 (L) 02/24/2019 0825   ALKPHOS 110 02/24/2019 0825   AST 10 (L) 02/24/2019 0825   ALT 8 02/24/2019 0825   BILITOT 0.7 02/24/2019 0825       RADIOGRAPHIC STUDIES: CT Biopsy  Result Date: 02-20-2019 CLINICAL DATA:  Lung carcinoma. Hypermetabolic bilateral adrenal lesions on PET-CT EXAM: CT GUIDED CORE BIOPSY OF LEFT RENAL LESION ANESTHESIA/SEDATION: Intravenous Fentanyl 162mcg and Versed 2mg  were administered as conscious sedation during continuous monitoring of the patient's level of consciousness and physiological / cardiorespiratory status by the radiology RN, with a total moderate sedation time of 25 minutes. PROCEDURE: The procedure risks, benefits, and alternatives were explained to the patient. Questions regarding the procedure were encouraged and answered. The  patient understands and consents to the procedure. Initially, patient was placed prone. Scanning showed lung complicating access to adrenal glands. Patient was then placed left lateral decubitus. Select axial scans through the thorax performed. Appropriate skin entry site was determined and marked. The operative field was prepped with chlorhexidinein a sterile fashion, and a sterile drape was applied covering the operative field. A sterile gown and sterile gloves were used for the procedure. Local anesthesia was provided with 1% Lidocaine. Under CT fluoroscopic guidance, a 17 gauge trocar needle was advanced to the margin of the lesion. Once needle tip position was confirmed, coaxial 18-gauge core biopsy samples were obtained, submitted in formalin to surgical pathology. The guide needle was removed. Postprocedure scans show minimal retroperitoneal hemorrhage. Patient remained asymptomatic. COMPLICATIONS: None immediate FINDINGS: Left adrenal nodule was localized. Representative core biopsy samples obtained as above. IMPRESSION: 1. Technically successful CT-guided core biopsy, left adrenal nodule Electronically Signed   By: Lucrezia Europe M.D.   On: February 20, 2019 11:03    ASSESSMENT AND PLAN: This is a very pleasant 74 years old white male diagnosed with metastatic non-small cell lung cancer that was initially diagnosed as unresectable stage IIb non-small cell lung cancer, adenocarcinoma presented with right upper lobe lung nodule in addition to right hilar lymphadenopathy diagnosed in February 2020.  The patient is now a good candidate for surgical resection. The patient underwent a course of concurrent chemoradiation with weekly carboplatin and paclitaxel status post 7 cycles completed in May 2020. The patient is currently on observation and he is feeling fine. He had repeat CT scan of the chest, abdomen pelvis performed recently.  I personally and independently reviewed the scan images and discussed the results  with the patient today. Unfortunately his a scan showed further progression of the bilateral adrenal lesions suspicious for metastasis. This finding were confirmed by a PET scan. The patient underwent CT-guided core biopsy of the left adrenal gland lesion and the final pathology was consistent with metastatic adenocarcinoma of the lung. The patient is currently undergoing systemic chemotherapy with carboplatin for AUC of 5, Alimta 500 mg/M2 and Keytruda 200 mg IV every 3 weeks.  He started the first cycle of this treatment last week and tolerated it fairly well. I recommended for the patient to continue his treatment with the same regiment as planned and he is expected to start cycle #2 in 2 weeks. For the skin rash I recommended for the patient to apply hydrocortisone cream to these areas. For hypertension he will continue with his current treatment with lisinopril. The patient will come back for follow-up visit in 2 weeks with the start of the next cycle of his treatment. He was advised to call immediately if he has any concerning symptoms in  the interval. The patient voices understanding of current disease status and treatment options and is in agreement with the current care plan. All questions were answered. The patient knows to call the clinic with any problems, questions or concerns. We can certainly see the patient much sooner if necessary.  Disclaimer: This note was dictated with voice recognition software. Similar sounding words can inadvertently be transcribed and may not be corrected upon review.

## 2019-03-03 ENCOUNTER — Inpatient Hospital Stay: Payer: Medicare Other

## 2019-03-03 ENCOUNTER — Other Ambulatory Visit: Payer: Self-pay

## 2019-03-03 DIAGNOSIS — C3411 Malignant neoplasm of upper lobe, right bronchus or lung: Secondary | ICD-10-CM | POA: Diagnosis not present

## 2019-03-03 DIAGNOSIS — C3492 Malignant neoplasm of unspecified part of left bronchus or lung: Secondary | ICD-10-CM

## 2019-03-03 DIAGNOSIS — C7951 Secondary malignant neoplasm of bone: Secondary | ICD-10-CM | POA: Diagnosis not present

## 2019-03-03 DIAGNOSIS — Z5112 Encounter for antineoplastic immunotherapy: Secondary | ICD-10-CM | POA: Diagnosis not present

## 2019-03-03 DIAGNOSIS — R0981 Nasal congestion: Secondary | ICD-10-CM | POA: Diagnosis not present

## 2019-03-03 DIAGNOSIS — Z5111 Encounter for antineoplastic chemotherapy: Secondary | ICD-10-CM | POA: Diagnosis not present

## 2019-03-03 DIAGNOSIS — R590 Localized enlarged lymph nodes: Secondary | ICD-10-CM | POA: Diagnosis not present

## 2019-03-03 LAB — CBC WITH DIFFERENTIAL (CANCER CENTER ONLY)
Abs Immature Granulocytes: 0 10*3/uL (ref 0.00–0.07)
Basophils Absolute: 0 10*3/uL (ref 0.0–0.1)
Basophils Relative: 1 %
Eosinophils Absolute: 0.1 10*3/uL (ref 0.0–0.5)
Eosinophils Relative: 7 %
HCT: 37.7 % — ABNORMAL LOW (ref 39.0–52.0)
Hemoglobin: 12.5 g/dL — ABNORMAL LOW (ref 13.0–17.0)
Immature Granulocytes: 0 %
Lymphocytes Relative: 30 %
Lymphs Abs: 0.6 10*3/uL — ABNORMAL LOW (ref 0.7–4.0)
MCH: 29.6 pg (ref 26.0–34.0)
MCHC: 33.2 g/dL (ref 30.0–36.0)
MCV: 89.3 fL (ref 80.0–100.0)
Monocytes Absolute: 0.3 10*3/uL (ref 0.1–1.0)
Monocytes Relative: 17 %
Neutro Abs: 0.9 10*3/uL — ABNORMAL LOW (ref 1.7–7.7)
Neutrophils Relative %: 45 %
Platelet Count: 79 10*3/uL — ABNORMAL LOW (ref 150–400)
RBC: 4.22 MIL/uL (ref 4.22–5.81)
RDW: 13.6 % (ref 11.5–15.5)
WBC Count: 1.9 10*3/uL — ABNORMAL LOW (ref 4.0–10.5)
nRBC: 0 % (ref 0.0–0.2)

## 2019-03-03 LAB — CMP (CANCER CENTER ONLY)
ALT: 48 U/L — ABNORMAL HIGH (ref 0–44)
AST: 30 U/L (ref 15–41)
Albumin: 3.7 g/dL (ref 3.5–5.0)
Alkaline Phosphatase: 128 U/L — ABNORMAL HIGH (ref 38–126)
Anion gap: 11 (ref 5–15)
BUN: 11 mg/dL (ref 8–23)
CO2: 23 mmol/L (ref 22–32)
Calcium: 8.7 mg/dL — ABNORMAL LOW (ref 8.9–10.3)
Chloride: 102 mmol/L (ref 98–111)
Creatinine: 1.08 mg/dL (ref 0.61–1.24)
GFR, Est AFR Am: >60 mL/min (ref >60)
GFR, Estimated: >60 mL/min (ref >60)
Glucose, Bld: 110 mg/dL — ABNORMAL HIGH (ref 70–99)
Potassium: 3.7 mmol/L (ref 3.5–5.1)
Sodium: 136 mmol/L (ref 135–145)
Total Bilirubin: 0.6 mg/dL (ref 0.3–1.2)
Total Protein: 6.6 g/dL (ref 6.5–8.1)

## 2019-03-10 ENCOUNTER — Inpatient Hospital Stay: Payer: Medicare Other

## 2019-03-10 ENCOUNTER — Inpatient Hospital Stay: Payer: Medicare Other | Attending: Internal Medicine | Admitting: Internal Medicine

## 2019-03-10 ENCOUNTER — Other Ambulatory Visit: Payer: Self-pay

## 2019-03-10 ENCOUNTER — Encounter: Payer: Self-pay | Admitting: Internal Medicine

## 2019-03-10 VITALS — BP 132/68 | HR 98 | Temp 98.3°F | Resp 20 | Ht 73.0 in | Wt 195.5 lb

## 2019-03-10 DIAGNOSIS — C3492 Malignant neoplasm of unspecified part of left bronchus or lung: Secondary | ICD-10-CM

## 2019-03-10 DIAGNOSIS — Z5112 Encounter for antineoplastic immunotherapy: Secondary | ICD-10-CM | POA: Insufficient documentation

## 2019-03-10 DIAGNOSIS — I739 Peripheral vascular disease, unspecified: Secondary | ICD-10-CM | POA: Diagnosis not present

## 2019-03-10 DIAGNOSIS — I2510 Atherosclerotic heart disease of native coronary artery without angina pectoris: Secondary | ICD-10-CM | POA: Diagnosis not present

## 2019-03-10 DIAGNOSIS — C7971 Secondary malignant neoplasm of right adrenal gland: Secondary | ICD-10-CM | POA: Diagnosis not present

## 2019-03-10 DIAGNOSIS — C7972 Secondary malignant neoplasm of left adrenal gland: Secondary | ICD-10-CM | POA: Diagnosis not present

## 2019-03-10 DIAGNOSIS — Z888 Allergy status to other drugs, medicaments and biological substances status: Secondary | ICD-10-CM | POA: Diagnosis not present

## 2019-03-10 DIAGNOSIS — F17200 Nicotine dependence, unspecified, uncomplicated: Secondary | ICD-10-CM

## 2019-03-10 DIAGNOSIS — R5383 Other fatigue: Secondary | ICD-10-CM | POA: Insufficient documentation

## 2019-03-10 DIAGNOSIS — Z5111 Encounter for antineoplastic chemotherapy: Secondary | ICD-10-CM | POA: Diagnosis not present

## 2019-03-10 DIAGNOSIS — I10 Essential (primary) hypertension: Secondary | ICD-10-CM | POA: Diagnosis not present

## 2019-03-10 DIAGNOSIS — C3411 Malignant neoplasm of upper lobe, right bronchus or lung: Secondary | ICD-10-CM | POA: Diagnosis not present

## 2019-03-10 DIAGNOSIS — R21 Rash and other nonspecific skin eruption: Secondary | ICD-10-CM | POA: Insufficient documentation

## 2019-03-10 DIAGNOSIS — Z79899 Other long term (current) drug therapy: Secondary | ICD-10-CM | POA: Diagnosis not present

## 2019-03-10 DIAGNOSIS — R5382 Chronic fatigue, unspecified: Secondary | ICD-10-CM

## 2019-03-10 LAB — CMP (CANCER CENTER ONLY)
ALT: 21 U/L (ref 0–44)
AST: 21 U/L (ref 15–41)
Albumin: 3.7 g/dL (ref 3.5–5.0)
Alkaline Phosphatase: 140 U/L — ABNORMAL HIGH (ref 38–126)
Anion gap: 12 (ref 5–15)
BUN: 21 mg/dL (ref 8–23)
CO2: 21 mmol/L — ABNORMAL LOW (ref 22–32)
Calcium: 9.1 mg/dL (ref 8.9–10.3)
Chloride: 102 mmol/L (ref 98–111)
Creatinine: 1.26 mg/dL — ABNORMAL HIGH (ref 0.61–1.24)
GFR, Est AFR Am: >60 mL/min (ref >60)
GFR, Estimated: 56 mL/min — ABNORMAL LOW (ref >60)
Glucose, Bld: 106 mg/dL — ABNORMAL HIGH (ref 70–99)
Potassium: 4 mmol/L (ref 3.5–5.1)
Sodium: 135 mmol/L (ref 135–145)
Total Bilirubin: 0.6 mg/dL (ref 0.3–1.2)
Total Protein: 7.2 g/dL (ref 6.5–8.1)

## 2019-03-10 LAB — CBC WITH DIFFERENTIAL (CANCER CENTER ONLY)
Abs Immature Granulocytes: 0.03 10*3/uL (ref 0.00–0.07)
Basophils Absolute: 0 10*3/uL (ref 0.0–0.1)
Basophils Relative: 0 %
Eosinophils Absolute: 0.1 10*3/uL (ref 0.0–0.5)
Eosinophils Relative: 1 %
HCT: 38.5 % — ABNORMAL LOW (ref 39.0–52.0)
Hemoglobin: 12.5 g/dL — ABNORMAL LOW (ref 13.0–17.0)
Immature Granulocytes: 1 %
Lymphocytes Relative: 12 %
Lymphs Abs: 0.7 10*3/uL (ref 0.7–4.0)
MCH: 29.1 pg (ref 26.0–34.0)
MCHC: 32.5 g/dL (ref 30.0–36.0)
MCV: 89.7 fL (ref 80.0–100.0)
Monocytes Absolute: 0.9 10*3/uL (ref 0.1–1.0)
Monocytes Relative: 15 %
Neutro Abs: 4.5 10*3/uL (ref 1.7–7.7)
Neutrophils Relative %: 71 %
Platelet Count: 252 10*3/uL (ref 150–400)
RBC: 4.29 MIL/uL (ref 4.22–5.81)
RDW: 14.8 % (ref 11.5–15.5)
WBC Count: 6.3 10*3/uL (ref 4.0–10.5)
nRBC: 0 % (ref 0.0–0.2)

## 2019-03-10 LAB — TSH: TSH: 2.22 u[IU]/mL (ref 0.320–4.118)

## 2019-03-10 MED ORDER — SODIUM CHLORIDE 0.9 % IV SOLN
452.5000 mg | Freq: Once | INTRAVENOUS | Status: AC
  Administered 2019-03-10: 18:00:00 450 mg via INTRAVENOUS
  Filled 2019-03-10: qty 45

## 2019-03-10 MED ORDER — SODIUM CHLORIDE 0.9 % IV SOLN
150.0000 mg | Freq: Once | INTRAVENOUS | Status: AC
  Administered 2019-03-10: 16:00:00 150 mg via INTRAVENOUS
  Filled 2019-03-10: qty 150

## 2019-03-10 MED ORDER — DIPHENHYDRAMINE HCL 50 MG/ML IJ SOLN
25.0000 mg | Freq: Once | INTRAMUSCULAR | Status: AC
  Administered 2019-03-10: 16:00:00 25 mg via INTRAVENOUS

## 2019-03-10 MED ORDER — PALONOSETRON HCL INJECTION 0.25 MG/5ML
INTRAVENOUS | Status: AC
  Filled 2019-03-10: qty 5

## 2019-03-10 MED ORDER — DEXAMETHASONE SODIUM PHOSPHATE 10 MG/ML IJ SOLN
INTRAMUSCULAR | Status: AC
  Filled 2019-03-10: qty 1

## 2019-03-10 MED ORDER — LIDOCAINE-PRILOCAINE 2.5-2.5 % EX CREA: TOPICAL_CREAM | CUTANEOUS | 0 refills | Status: DC

## 2019-03-10 MED ORDER — SODIUM CHLORIDE 0.9 % IV SOLN
200.0000 mg | Freq: Once | INTRAVENOUS | Status: AC
  Administered 2019-03-10: 17:00:00 200 mg via INTRAVENOUS
  Filled 2019-03-10: qty 8

## 2019-03-10 MED ORDER — DEXAMETHASONE SODIUM PHOSPHATE 10 MG/ML IJ SOLN
5.0000 mg | Freq: Once | INTRAMUSCULAR | Status: AC
  Administered 2019-03-10: 16:00:00 5 mg via INTRAVENOUS

## 2019-03-10 MED ORDER — PALONOSETRON HCL INJECTION 0.25 MG/5ML
0.2500 mg | Freq: Once | INTRAVENOUS | Status: AC
  Administered 2019-03-10: 15:00:00 0.25 mg via INTRAVENOUS

## 2019-03-10 MED ORDER — SODIUM CHLORIDE 0.9 % IV SOLN
Freq: Once | INTRAVENOUS | Status: AC
  Filled 2019-03-10: qty 250

## 2019-03-10 MED ORDER — DIPHENHYDRAMINE HCL 50 MG/ML IJ SOLN
INTRAMUSCULAR | Status: AC
  Filled 2019-03-10: qty 1

## 2019-03-10 MED ORDER — SODIUM CHLORIDE 0.9 % IV SOLN
500.0000 mg/m2 | Freq: Once | INTRAVENOUS | Status: AC
  Administered 2019-03-10: 18:00:00 1100 mg via INTRAVENOUS
  Filled 2019-03-10: qty 40

## 2019-03-10 NOTE — Patient Instructions (Signed)
[facility] Discharge Instructions for Patients Receiving Chemotherapy  Today you received the following chemotherapy agents Keytruda, Alimta, Carboplatin.  To help prevent nausea and vomiting after your treatment, we encourage you to take your nausea medication DO NOT TAKE ZOFRAN FOR THREE DAYS AFTER TREATMENT.   If you develop nausea and vomiting that is not controlled by your nausea medication, call the clinic.   BELOW ARE SYMPTOMS THAT SHOULD BE REPORTED IMMEDIATELY:  *FEVER GREATER THAN 100.5 F  *CHILLS WITH OR WITHOUT FEVER  NAUSEA AND VOMITING THAT IS NOT CONTROLLED WITH YOUR NAUSEA MEDICATION  *UNUSUAL SHORTNESS OF BREATH  *UNUSUAL BRUISING OR BLEEDING  TENDERNESS IN MOUTH AND THROAT WITH OR WITHOUT PRESENCE OF ULCERS  *URINARY PROBLEMS  *BOWEL PROBLEMS  UNUSUAL RASH Items with * indicate a potential emergency and should be followed up as soon as possible.  Feel free to call the clinic should you have any questions or concerns. The clinic phone number is [phone].  Please show the [place] at check-in to the Emergency Department and triage nurse.  Pembrolizumab injection What is this medicine? PEMBROLIZUMAB (pem broe liz ue mab) is a monoclonal antibody. It is used to treat certain types of cancer. This medicine may be used for other purposes; ask your health care provider or pharmacist if you have questions. COMMON BRAND NAME(S): Keytruda What should I tell my health care provider before I take this medicine? They need to know if you have any of these conditions:  diabetes  immune system problems  inflammatory bowel disease  liver disease  lung or breathing disease  lupus  received or scheduled to receive an organ transplant or a stem-cell transplant that uses donor stem cells  an unusual or allergic reaction to pembrolizumab, other medicines, foods, dyes, or preservatives  pregnant or trying to get  pregnant  breast-feeding How should I use this medicine? This medicine is for infusion into a vein. It is given by a health care professional in a hospital or clinic setting. A special MedGuide will be given to you before each treatment. Be sure to read this information carefully each time. Talk to your pediatrician regarding the use of this medicine in children. While this drug may be prescribed for children as young as 6 months for selected conditions, precautions do apply. Overdosage: If you think you have taken too much of this medicine contact a poison control center or emergency room at once. NOTE: This medicine is only for you. Do not share this medicine with others. What if I miss a dose? It is important not to miss your dose. Call your doctor or health care professional if you are unable to keep an appointment. What may interact with this medicine? Interactions have not been studied. Give your health care provider a list of all the medicines, herbs, non-prescription drugs, or dietary supplements you use. Also tell them if you smoke, drink alcohol, or use illegal drugs. Some items may interact with your medicine. This list may not describe all possible interactions. Give your health care provider a list of all the medicines, herbs, non-prescription drugs, or dietary supplements you use. Also tell them if you smoke, drink alcohol, or use illegal drugs. Some items may interact with your medicine. What should I watch for while using this medicine? Your condition will be monitored carefully while you are receiving this medicine. You may need blood work done while you are taking this medicine. Do not become pregnant while taking this medicine  or for 4 months after stopping it. Women should inform their doctor if they wish to become pregnant or think they might be pregnant. There is a potential for serious side effects to an unborn child. Talk to your health care professional or pharmacist for  more information. Do not breast-feed an infant while taking this medicine or for 4 months after the last dose. What side effects may I notice from receiving this medicine? Side effects that you should report to your doctor or health care professional as soon as possible:  allergic reactions like skin rash, itching or hives, swelling of the face, lips, or tongue  bloody or black, tarry  breathing problems  changes in vision  chest pain  chills  confusion  constipation  cough  diarrhea  dizziness or feeling faint or lightheaded  fast or irregular heartbeat  fever  flushing  joint pain  low blood counts - this medicine may decrease the number of white blood cells, red blood cells and platelets. You may be at increased risk for infections and bleeding.  muscle pain  muscle weakness  pain, tingling, numbness in the hands or feet  persistent headache  redness, blistering, peeling or loosening of the skin, including inside the mouth  signs and symptoms of high blood sugar such as dizziness; dry mouth; dry skin; fruity breath; nausea; stomach pain; increased hunger or thirst; increased urination  signs and symptoms of kidney injury like trouble passing urine or change in the amount of urine  signs and symptoms of liver injury like dark urine, light-colored stools, loss of appetite, nausea, right upper belly pain, yellowing of the eyes or skin  sweating  swollen lymph nodes  weight loss Side effects that usually do not require medical attention (report to your doctor or health care professional if they continue or are bothersome):  decreased appetite  hair loss  muscle pain  tiredness This list may not describe all possible side effects. Call your doctor for medical advice about side effects. You may report side effects to FDA at 1-800-FDA-1088. Where should I keep my medicine? This drug is given in a hospital or clinic and will not be stored at home. NOTE:  This sheet is a summary. It may not cover all possible information. If you have questions about this medicine, talk to your doctor, pharmacist, or health care provider.  2020 Elsevier/Gold Standard (2018-11-28 18:07:58)

## 2019-03-10 NOTE — Progress Notes (Signed)
[facility] [phone]   [phone]) [phone]  OFFICE PROGRESS NOTE  [name], MD [place] [address]  DIAGNOSIS: Metastatic non-small cell lung cancer, adenocarcinoma initially diagnosed as stage IIB (T1c, N1, M0) non-small cell lung cancer favoring adenocarcinoma presented with right upper lobe lung nodule in addition to right hilar lymphadenopathy diagnosed in February 2020.  The patient is not a good surgical candidate for resection.  He had evidence for disease recurrence in December 2020 with adrenal gland metastasis.  PRIOR THERAPY: Concurrent chemoradiation with weekly carboplatin for AUC of 2 and paclitaxel 45 mg/M2.  First dose May 05, 2018.  Status post 7 cycles.  Last dose was given Jun 16, 2018  CURRENT THERAPY: Systemic chemotherapy with carboplatin for AUC of 5, Alimta 500 mg/M2 and Keytruda 200 mg IV every 3 weeks.  First dose starting February 17, 2019. Status post 1 cycle.  INTERVAL HISTORY: Seth [name] 74 y.o. male returns to the clinic today for follow-up visit.  The patient is feeling fine today with no concerning complaints except for fatigue that lasted for more than a week after his chemotherapy.  He denied having any current chest pain, shortness of breath, cough or hemoptysis.  He denied having any fever or chills.  He has no nausea, vomiting, diarrhea or constipation.  He denied having any headache or visual changes.  He is here today for evaluation before starting cycle #2 of his treatment.  MEDICAL HISTORY: Past Medical History:  Diagnosis Date  . AAA (abdominal aortic aneurysm) ([place])   . Allergic rhinitis   . Anxiety   . Back pain   . CAD (coronary artery disease)   . Cataracts, bilateral   . Emphysema, unspecified ([place])     " moderate"  . Essential hypertension, benign   . GERD (gastroesophageal reflux disease)   . Gout   . Headache    migraines  . Heart murmur    as a child only  . Hilar  adenopathy   . History of hiatal hernia   . History of sciatica   . Hyperlipidemia   . lung ca dx'd 02/2018  . Lung nodule   . Pneumonia    as child  . Prostate hypertrophy   . PVD (peripheral vascular disease) ([place])   . Seasonal allergies   . Subclavian steal syndrome of left subclavian artery   . Wears dentures   . Wears dentures     ALLERGIES:  is allergic to biaxin [clarithromycin]; clarithromycin; iodinated diagnostic agents; prednisone; cortisone; flomax [tamsulosin hcl]; hepatitis b virus vaccines; and iodine.  MEDICATIONS:  Current Outpatient Medications  Medication Sig Dispense Refill  . atorvastatin (LIPITOR) 40 MG tablet Take 1 tablet (40 mg total) by mouth daily. 90 tablet 3  . cetirizine (ZYRTEC) 10 MG tablet Take 10 mg by mouth daily at 12 noon.    . clopidogrel (PLAVIX) 75 MG tablet TAKE 1 TABLET BY MOUTH EVERY DAY 30 tablet 2  . famotidine (PEPCID) 20 MG tablet Take 20 mg by mouth 2 (two) times daily. Noon & in the evening.    . folic acid (FOLVITE) 1 MG tablet Take 1 tablet (1 mg total) by mouth daily. 30 tablet 4  . lisinopril (ZESTRIL) 10 MG tablet Take 1 tablet (10 mg total) by mouth daily. 90 tablet 1  . prochlorperazine (COMPAZINE) 10 MG tablet Take 1 tablet (10 mg total) by mouth every 6 (six) hours as needed for nausea  or vomiting. 30 tablet 0  . terbinafine (LAMISIL) 1 % cream Apply 1 application topically 2 (two) times daily.     Current Facility-Administered Medications  Medication Dose Route Frequency Provider Last Rate Last Admin  . 0.9 %  sodium chloride infusion  500 mL Intravenous Once Irene Shipper, MD        SURGICAL HISTORY:  Past Surgical History:  Procedure Laterality Date  . CARDIAC CATHETERIZATION     stent placement  . COLONOSCOPY W/ BIOPSIES AND POLYPECTOMY    . ILIAC ARTERY STENT    . MULTIPLE TOOTH EXTRACTIONS    . VIDEO BRONCHOSCOPY WITH ENDOBRONCHIAL NAVIGATION N/A 02/26/2018   Procedure: VIDEO BRONCHOSCOPY WITH ENDOBRONCHIAL  NAVIGATION;  Surgeon: Garner Nash, DO;  Location: [facility];  Service: Thoracic;  Laterality: N/A;  . VIDEO BRONCHOSCOPY WITH ENDOBRONCHIAL NAVIGATION N/A 03/19/2018   Procedure: VIDEO BRONCHOSCOPY WITH ENDOBRONCHIAL NAVIGATION and endobronchial ultrasound;  Surgeon: Garner Nash, DO;  Location: [facility];  Service: Thoracic;  Laterality: N/A;  . VIDEO BRONCHOSCOPY WITH ENDOBRONCHIAL ULTRASOUND N/A 02/26/2018   Procedure: VIDEO BRONCHOSCOPY WITH ENDOBRONCHIAL ULTRASOUND;  Surgeon: Garner Nash, DO;  Location: [facility];  Service: Thoracic;  Laterality: N/A;  . VIDEO BRONCHOSCOPY WITH ENDOBRONCHIAL ULTRASOUND N/A 03/19/2018   Procedure: NAVIGATION BRONCHOSCOPY;  Surgeon: Garner Nash, DO;  Location: [facility];  Service: Thoracic;  Laterality: N/A;    REVIEW OF SYSTEMS:  A comprehensive review of systems was negative except for: Constitutional: positive for fatigue   PHYSICAL EXAMINATION: General appearance: alert, cooperative, fatigued and no distress Head: Normocephalic, without obvious abnormality, atraumatic Neck: no adenopathy, no JVD, supple, symmetrical, trachea midline and thyroid not enlarged, symmetric, no tenderness/mass/nodules Lymph nodes: Cervical, supraclavicular, and axillary nodes normal. Resp: clear to auscultation bilaterally Back: symmetric, no curvature. ROM normal. No CVA tenderness. Cardio: regular rate and rhythm, S1, S2 normal, no murmur, click, rub or gallop GI: soft, non-tender; bowel sounds normal; no masses,  no organomegaly Extremities: extremities normal, atraumatic, no cyanosis or edema  ECOG PERFORMANCE STATUS: 1 - Symptomatic but completely ambulatory  Blood pressure 132/68, pulse 98, temperature 98.3 F (36.8 C), temperature source Temporal, resp. rate 20, height 6\' 1"  (1.854 m), weight 195 lb 8 oz (88.7 kg), SpO2 100 %.   LABORATORY DATA: Lab Results  Component Value Date   WBC 6.3 03/10/2019   HGB 12.5 (L) 03/10/2019   HCT 38.5 (L) 03/10/2019   MCV  89.7 03/10/2019   PLT 252 03/10/2019      Chemistry      Component Value Date/Time   NA 136 03/03/2019 1149   NA 133 (L) 01/19/2019 1457   K 3.7 03/03/2019 1149   CL 102 03/03/2019 1149   CO2 23 03/03/2019 1149   BUN 11 03/03/2019 1149   BUN 9 01/19/2019 1457   CREATININE 1.08 03/03/2019 1149   CREATININE 1.09 03/23/2015 0928      Component Value Date/Time   CALCIUM 8.7 (L) 03/03/2019 1149   ALKPHOS 128 (H) 03/03/2019 1149   AST 30 03/03/2019 1149   ALT 48 (H) 03/03/2019 1149   BILITOT 0.6 03/03/2019 1149       RADIOGRAPHIC STUDIES: No results found.  ASSESSMENT AND PLAN: This is a very pleasant 74 years old white male diagnosed with metastatic non-small cell lung cancer that was initially diagnosed as unresectable stage IIb non-small cell lung cancer, adenocarcinoma presented with right upper lobe lung nodule in addition to right hilar lymphadenopathy diagnosed in February 2020.  The  patient is now a good candidate for surgical resection. The patient underwent a course of concurrent chemoradiation with weekly carboplatin and paclitaxel status post 7 cycles completed in May 2020. The patient is currently on observation and he is feeling fine. He had repeat CT scan of the chest, abdomen pelvis performed recently.  I personally and independently reviewed the scan images and discussed the results with the patient today. Unfortunately his a scan showed further progression of the bilateral adrenal lesions suspicious for metastasis. This finding were confirmed by a PET scan. The patient underwent CT-guided core biopsy of the left adrenal gland lesion and the final pathology was consistent with metastatic adenocarcinoma of the lung. The patient is currently undergoing systemic chemotherapy with carboplatin for AUC of 5, Alimta 500 mg/M2 and Keytruda 200 mg IV every 3 weeks.  He is status post 1 cycle.  He tolerated the first cycle of his treatment well except for fatigue. I recommended  for the patient to proceed with cycle #2 today as planned. For the IV access, I will arrange for the patient to have a Port-A-Cath placed before his next treatment.  I also order EMLA cream to be applied to the Port-A-Cath site before treatment. For the skin rash he will continue to apply hydrocortisone cream to the skin lesions. The patient will come back for follow-up visit in 3 weeks for evaluation before the next cycle of his treatment. The patient voices understanding of current disease status and treatment options and is in agreement with the current care plan. All questions were answered. The patient knows to call the clinic with any problems, questions or concerns. We can certainly see the patient much sooner if necessary.  Disclaimer: This note was dictated with voice recognition software. Similar sounding words can inadvertently be transcribed and may not be corrected upon review.

## 2019-03-10 NOTE — Progress Notes (Signed)
Spoke with Seth [name] today, he is getting a little bit of redness following his treatments consistent with previous steroid reactions. He said this is not bothersome. He is having some nausea following his chemo treatment and would like to continue the dexamethasone. He will get diphenhydramine 25mg  w/ treatment to help with this, okay'ed by Dr. Julien Nordmann.   Per Dr. Julien Nordmann, adjust carboplatin dose for renal function and weight change.   Demetrius Charity, PharmD, [facility] Pharmacist Pharmacy Phone: [phone] 03/10/2019

## 2019-03-11 ENCOUNTER — Telehealth: Payer: Self-pay | Admitting: Medical Oncology

## 2019-03-11 ENCOUNTER — Telehealth: Payer: Self-pay | Admitting: *Deleted

## 2019-03-11 NOTE — Telephone Encounter (Signed)
He may need to be tested for COVID. Cough drops for the sore throat and warm liquids.

## 2019-03-11 NOTE — Telephone Encounter (Signed)
Sore throat at 3 am woke him up. It is worse when he swallows and feels like the pain goes up to sinuses. Runny nose just started. He is now hoarse. Temp 95.9.   Wife did not see any changes in mouth or tongue.  Did not take zyrtec last night. Hx sinusitis.  His sense of taste and smell is diminished and he says this has gone on for 6-7 years.   Red Face-took benadryl today at 1030.   What can he take for sore throat?  H will take some Tylenol now.

## 2019-03-11 NOTE — Telephone Encounter (Signed)
Pt called stating he had symptoms a sore throat and runny nose. Per Dr. Julien Nordmann, advised to take cough drops and push warm liquids. Also suggested to have a COVID test done at [facility]. Pt agreed and verbalized understanding.

## 2019-03-12 ENCOUNTER — Telehealth: Payer: Self-pay | Admitting: Family Medicine

## 2019-03-12 ENCOUNTER — Ambulatory Visit: Payer: Medicare Other | Attending: Internal Medicine

## 2019-03-12 DIAGNOSIS — Z20822 Contact with and (suspected) exposure to covid-19: Secondary | ICD-10-CM | POA: Diagnosis not present

## 2019-03-12 DIAGNOSIS — E785 Hyperlipidemia, unspecified: Secondary | ICD-10-CM

## 2019-03-12 DIAGNOSIS — I10 Essential (primary) hypertension: Secondary | ICD-10-CM

## 2019-03-12 NOTE — Telephone Encounter (Signed)
Last order in history- August 2020- request has been denied- sent for review of medication for refill.

## 2019-03-12 NOTE — Telephone Encounter (Signed)
Pt following up on Rx  metoprolol tartrate (LOPRESSOR) 50 MG tablet  Pt has appt 03/20/19

## 2019-03-13 LAB — NOVEL CORONAVIRUS, NAA: SARS-CoV-2, NAA: NOT DETECTED

## 2019-03-16 ENCOUNTER — Other Ambulatory Visit: Payer: Self-pay | Admitting: Family Medicine

## 2019-03-16 ENCOUNTER — Other Ambulatory Visit: Payer: Self-pay | Admitting: Emergency Medicine

## 2019-03-16 ENCOUNTER — Telehealth: Payer: Self-pay | Admitting: Medical Oncology

## 2019-03-16 DIAGNOSIS — E785 Hyperlipidemia, unspecified: Secondary | ICD-10-CM

## 2019-03-16 DIAGNOSIS — I10 Essential (primary) hypertension: Secondary | ICD-10-CM

## 2019-03-16 DIAGNOSIS — S82832D Other fracture of upper and lower end of left fibula, subsequent encounter for closed fracture with routine healing: Secondary | ICD-10-CM | POA: Diagnosis not present

## 2019-03-16 MED ORDER — METOPROLOL TARTRATE 50 MG PO TABS: 50.0000 mg | ORAL_TABLET | Freq: Two times a day (BID) | ORAL | 0 refills | Status: DC

## 2019-03-16 NOTE — Telephone Encounter (Signed)
[phone] Pt wants call back from Nurse, believes he is still taking medication.

## 2019-03-16 NOTE — Telephone Encounter (Signed)
Constipation- small balls of stool .  Pt instructed to start  OTC Senakot-s or dulcolax today and take  Magnesium Citrate if senna does not work .  ?conjuctivitis-Both eyes red and crusty. Eyelashes matted together in am.  He will call his PCP today Dr Nyoka Cowden for appt,

## 2019-03-16 NOTE — Telephone Encounter (Signed)
Metoprolol has been sent to pharmcy

## 2019-03-16 NOTE — Telephone Encounter (Signed)
This medication is not on the patient's current medication profile. Refusing request.

## 2019-03-17 ENCOUNTER — Telehealth: Payer: Self-pay | Admitting: Medical Oncology

## 2019-03-17 ENCOUNTER — Other Ambulatory Visit: Payer: Self-pay

## 2019-03-17 ENCOUNTER — Inpatient Hospital Stay: Payer: Medicare Other

## 2019-03-17 DIAGNOSIS — Z5111 Encounter for antineoplastic chemotherapy: Secondary | ICD-10-CM | POA: Diagnosis not present

## 2019-03-17 DIAGNOSIS — C3492 Malignant neoplasm of unspecified part of left bronchus or lung: Secondary | ICD-10-CM

## 2019-03-17 DIAGNOSIS — R5383 Other fatigue: Secondary | ICD-10-CM | POA: Diagnosis not present

## 2019-03-17 DIAGNOSIS — C3411 Malignant neoplasm of upper lobe, right bronchus or lung: Secondary | ICD-10-CM | POA: Diagnosis not present

## 2019-03-17 DIAGNOSIS — C7971 Secondary malignant neoplasm of right adrenal gland: Secondary | ICD-10-CM | POA: Diagnosis not present

## 2019-03-17 DIAGNOSIS — C7972 Secondary malignant neoplasm of left adrenal gland: Secondary | ICD-10-CM | POA: Diagnosis not present

## 2019-03-17 DIAGNOSIS — Z5112 Encounter for antineoplastic immunotherapy: Secondary | ICD-10-CM | POA: Diagnosis not present

## 2019-03-17 LAB — CBC WITH DIFFERENTIAL (CANCER CENTER ONLY)
Abs Immature Granulocytes: 0.03 10*3/uL (ref 0.00–0.07)
Basophils Absolute: 0 10*3/uL (ref 0.0–0.1)
Basophils Relative: 1 %
Eosinophils Absolute: 0 10*3/uL (ref 0.0–0.5)
Eosinophils Relative: 1 %
HCT: 37.4 % — ABNORMAL LOW (ref 39.0–52.0)
Hemoglobin: 12.4 g/dL — ABNORMAL LOW (ref 13.0–17.0)
Immature Granulocytes: 1 %
Lymphocytes Relative: 23 %
Lymphs Abs: 0.7 10*3/uL (ref 0.7–4.0)
MCH: 29.3 pg (ref 26.0–34.0)
MCHC: 33.2 g/dL (ref 30.0–36.0)
MCV: 88.4 fL (ref 80.0–100.0)
Monocytes Absolute: 0.1 10*3/uL (ref 0.1–1.0)
Monocytes Relative: 2 %
Neutro Abs: 2.1 10*3/uL (ref 1.7–7.7)
Neutrophils Relative %: 72 %
Platelet Count: 199 10*3/uL (ref 150–400)
RBC: 4.23 MIL/uL (ref 4.22–5.81)
RDW: 14.5 % (ref 11.5–15.5)
WBC Count: 2.9 10*3/uL — ABNORMAL LOW (ref 4.0–10.5)
nRBC: 0 % (ref 0.0–0.2)

## 2019-03-17 LAB — CMP (CANCER CENTER ONLY)
ALT: 17 U/L (ref 0–44)
AST: 16 U/L (ref 15–41)
Albumin: 3.8 g/dL (ref 3.5–5.0)
Alkaline Phosphatase: 128 U/L — ABNORMAL HIGH (ref 38–126)
Anion gap: 11 (ref 5–15)
BUN: 23 mg/dL (ref 8–23)
CO2: 23 mmol/L (ref 22–32)
Calcium: 8.9 mg/dL (ref 8.9–10.3)
Chloride: 101 mmol/L (ref 98–111)
Creatinine: 1.26 mg/dL — ABNORMAL HIGH (ref 0.61–1.24)
GFR, Est AFR Am: >60 mL/min (ref >60)
GFR, Estimated: 56 mL/min — ABNORMAL LOW (ref >60)
Glucose, Bld: 117 mg/dL — ABNORMAL HIGH (ref 70–99)
Potassium: 4.3 mmol/L (ref 3.5–5.1)
Sodium: 135 mmol/L (ref 135–145)
Total Bilirubin: 0.8 mg/dL (ref 0.3–1.2)
Total Protein: 7.1 g/dL (ref 6.5–8.1)

## 2019-03-17 NOTE — Telephone Encounter (Signed)
Pt seeing PCP tomorrow for "itchy eyes". He said they were not matted this am . There is no redness.

## 2019-03-18 ENCOUNTER — Encounter: Payer: Self-pay | Admitting: Family Medicine

## 2019-03-18 ENCOUNTER — Ambulatory Visit (INDEPENDENT_AMBULATORY_CARE_PROVIDER_SITE_OTHER): Payer: Medicare Other | Admitting: Family Medicine

## 2019-03-18 VITALS — BP 119/76 | HR 85 | Temp 97.7°F | Ht 73.0 in | Wt 185.0 lb

## 2019-03-18 DIAGNOSIS — S80819A Abrasion, unspecified lower leg, initial encounter: Secondary | ICD-10-CM | POA: Diagnosis not present

## 2019-03-18 DIAGNOSIS — I739 Peripheral vascular disease, unspecified: Secondary | ICD-10-CM

## 2019-03-18 DIAGNOSIS — Z8669 Personal history of other diseases of the nervous system and sense organs: Secondary | ICD-10-CM

## 2019-03-18 NOTE — Patient Instructions (Addendum)
  Ok to use over the counter allergy drops for eyes, but if redness returns or disharge returns - be seen that day - eye provider or with me - video visit ok.   Keep wounds on legs clean with soap and water at least once per day, then cover with clean bandage. Polysporin over the counter to any open areas for now. eucerin daily to any dry skin - more if needed. Keep follow up with Dr. Sharol Given as planned for left leg. If any increased redness, ulcers, or other worsening - return right away.   Thanks for coming in today.   Return to the clinic or go to the nearest emergency room if any of your symptoms worsen or new symptoms occur.    If you have lab work done today you will be contacted with your lab results within the next 2 weeks.  If you have not heard from [place] then please contact us. The fastest way to get your results is to register for My Chart.   IF you received an x-ray today, you will receive an invoice from [facility]. Please contact [facility] at [phone] with questions or concerns regarding your invoice.   IF you received labwork today, you will receive an invoice from [facility]. Please contact [facility] at [phone] with questions or concerns regarding your invoice.   Our billing staff will not be able to assist you with questions regarding bills from these companies.  You will be contacted with the lab results as soon as they are available. The fastest way to get your results is to activate your My Chart account. Instructions are located on the last page of this paperwork. If you have not heard from [place] regarding the results in 2 weeks, please contact this office.

## 2019-03-18 NOTE — Progress Notes (Signed)
Subjective:  Patient ID: Seth [name], male    DOB: 04/28/1945  Age: 74 y.o. MRN: [id number]  CC:  Chief Complaint  Patient presents with  . Follow-up    pedal Edema. pt states his feet are no longer swollen. pt has pain in the L ankel. pt states this is probaly from the recent removal of the boot brace th pt was waring since he brok his ankel. no numbness or tingeling in pt feet at this time.  . possible eye infection    pt stats his encologist thought he had an eye infection in both of the pt's eyes and they wanted him to get it look at. pt had a telephone appt with them on the 8th. pt had blered vision and pt states he has fluid draining from his eyes. pt states the fluid was clear. pt also states his eyes could have a yellow crust over them when he woke up in the morning. pt dose state it has gotten better as of yesterday.    HPI [name] presents for   Pedal edema: See previous visits.  History of venous stasis, PVD.  Had been improving with less salt, leg elevation, sleeping in bed instead of recliner.  Previous orthopedic issues were improving.  Plan continued elevation as possible, compressive stockings as possible.  Decided against diuretic last visit January 15.  Cam walker on left- removed 2 days ago. Dr. Noemi Chapel wanted him to meet with Dr. Sharol Given d/t ankle wound - no appt yet.   Few open areas on R front of leg - few days. Tx: neosporin.  Wound on left ankle few weeks- smaller. Abrasion on foot form boot - not open.  eucerin few times per week No    Eye discharge: Both eyes, watery discharge.  Occasionally crust in the morning primarily, sometimes in corners. Noted watery eyes,redness  after chemo on 2/4. Itchy at times.  Tx: otc allergy eye drops - helped some, improved since yesterday. Min blurry today, but overall better.  Optho: has one, but can't remember name. appt in March.  No crusting this am.   No exam data present     History Patient Active Problem List    Diagnosis Date Noted  . Adenocarcinoma of left lung, stage 4 ([place]) 02/03/2019  . Encounter for antineoplastic immunotherapy 02/03/2019  . Frequent nosebleeds 05/28/2018  . Encounter for antineoplastic chemotherapy 04/18/2018  . Goals of care, counseling/discussion 04/18/2018  . Adenocarcinoma of left lung, stage 2 ([place]) 03/27/2018  . Abnormal PET of left lung   . S/P bronchoscopy   . Solitary lung nodule 02/20/2018  . Hilar adenopathy 02/20/2018  . Abnormal findings on diagnostic imaging of lung 02/20/2018  . Platelet inhibition due to Plavix 02/20/2018  . PVD (peripheral vascular disease) ([place]) 10/15/2013  . Tobacco use disorder 10/15/2013  . Pure hypercholesterolemia 10/15/2013  . Coronary artery disease involving native coronary artery of native heart without angina pectoris 10/15/2013  . Cataract 09/29/2013  . Colon cancer screening 09/25/2013  . AAA (abdominal aortic aneurysm) without rupture ([place]) 10/22/2012  . Nicotine addiction 10/17/2012  . Atherosclerosis of native arteries of the extremities with ulceration(440.23) 10/17/2012  . Carotid bruit 10/17/2012  . Other nonspecific abnormal cardiovascular system function study 04/10/2012  . History of sciatica   . Essential hypertension, benign   . Seasonal allergies    Past Medical History:  Diagnosis Date  . AAA (abdominal aortic aneurysm) ([place])   . Allergic rhinitis   .  Anxiety   . Back pain   . CAD (coronary artery disease)   . Cataracts, bilateral   . Emphysema, unspecified ([place])     " moderate"  . Essential hypertension, benign   . GERD (gastroesophageal reflux disease)   . Gout   . Headache    migraines  . Heart murmur    as a child only  . Hilar adenopathy   . History of hiatal hernia   . History of sciatica   . Hyperlipidemia   . lung ca dx'd 02/2018  . Lung nodule   . Pneumonia    as child  . Prostate hypertrophy   . PVD (peripheral vascular disease) ([place])   . Seasonal allergies   . Subclavian  steal syndrome of left subclavian artery   . Wears dentures   . Wears dentures    Past Surgical History:  Procedure Laterality Date  . CARDIAC CATHETERIZATION     stent placement  . COLONOSCOPY W/ BIOPSIES AND POLYPECTOMY    . ILIAC ARTERY STENT    . MULTIPLE TOOTH EXTRACTIONS    . VIDEO BRONCHOSCOPY WITH ENDOBRONCHIAL NAVIGATION N/A 02/26/2018   Procedure: VIDEO BRONCHOSCOPY WITH ENDOBRONCHIAL NAVIGATION;  Surgeon: Garner Nash, DO;  Location: [facility];  Service: Thoracic;  Laterality: N/A;  . VIDEO BRONCHOSCOPY WITH ENDOBRONCHIAL NAVIGATION N/A 03/19/2018   Procedure: VIDEO BRONCHOSCOPY WITH ENDOBRONCHIAL NAVIGATION and endobronchial ultrasound;  Surgeon: Garner Nash, DO;  Location: [facility];  Service: Thoracic;  Laterality: N/A;  . VIDEO BRONCHOSCOPY WITH ENDOBRONCHIAL ULTRASOUND N/A 02/26/2018   Procedure: VIDEO BRONCHOSCOPY WITH ENDOBRONCHIAL ULTRASOUND;  Surgeon: Garner Nash, DO;  Location: [facility];  Service: Thoracic;  Laterality: N/A;  . VIDEO BRONCHOSCOPY WITH ENDOBRONCHIAL ULTRASOUND N/A 03/19/2018   Procedure: NAVIGATION BRONCHOSCOPY;  Surgeon: Garner Nash, DO;  Location[facility];  Service: Thoracic;  Laterality: N/A;   Allergies  Allergen Reactions  . Biaxin [Clarithromycin] Shortness Of Breath  . Clarithromycin Shortness Of Breath  . Iodinated Diagnostic Agents Hives  . Prednisone Other (See Comments)    Patient states he turns REALLY RED all over.  . Cortisone Other (See Comments)    Turns red from breast up  . Flomax [Tamsulosin Hcl]     Blurred vision and lower back pain   . Hepatitis B Virus Vaccines     Nerve problems States his previous doctor told him it was due to this   . Iodine Hives    Dye from nuclear medicine test   Prior to Admission medications   Medication Sig Start Date End Date Taking? Authorizing Provider  atorvastatin (LIPITOR) 40 MG tablet Take 1 tablet (40 mg total) by mouth daily. 04/09/18 04/09/19 Yes Turner, Eber Hong, MD  cetirizine  (ZYRTEC) 10 MG tablet Take 10 mg by mouth daily at 12 noon.   Yes [provider]  clopidogrel (PLAVIX) 75 MG tablet TAKE 1 TABLET BY MOUTH EVERY DAY 02/19/19  Yes [name], MD  famotidine (PEPCID) 20 MG tablet Take 20 mg by mouth 2 (two) times daily. Noon & in the evening.   Yes [provider]  folic acid (FOLVITE) 1 MG tablet Take 1 tablet (1 mg total) by mouth daily. 02/03/19  Yes Curt Bears, MD  lidocaine-prilocaine (EMLA) cream Apply to Port-A-Cath site 30 minutes before infusion. 03/10/19  Yes Curt Bears, MD  lisinopril (ZESTRIL) 10 MG tablet Take 1 tablet (10 mg total) by mouth daily. 09/18/18  Yes [name], MD  metoprolol tartrate (LOPRESSOR)  50 MG tablet Take 1 tablet (50 mg total) by mouth 2 (two) times daily. 03/16/19  Yes [name], MD  Olopatadine HCl 0.2 % SOLN 1 drop daily.   Yes [provider]  prochlorperazine (COMPAZINE) 10 MG tablet Take 1 tablet (10 mg total) by mouth every 6 (six) hours as needed for nausea or vomiting. 02/03/19  Yes Curt Bears, MD  sodium chloride 0.9 % SOLN 100 mL with lidocaine-EPINEPHrine 0.5 %-1:200000 SOLN 100 mL    Yes [provider]  terbinafine (LAMISIL) 1 % cream Apply 1 application topically 2 (two) times daily.   Yes [provider]   Social History   Socioeconomic History  . Marital status: Married    Spouse name: Not on file  . Number of children: 1  . Years of education: Not on file  . Highest education level: Not on file  Occupational History  . Occupation: retired  Tobacco Use  . Smoking status: Former Smoker    Packs/day: 3.00    Years: 50.00    Pack years: 150.00    Types: Cigarettes    Quit date: 04/05/2016    Years since quitting: 2.9  . Smokeless tobacco: Never Used  Substance and Sexual Activity  . Alcohol use: Yes    Alcohol/week: 0.0 standard drinks    Comment: rare  . Drug use: No  . Sexual activity: Not on file  Other Topics Concern    . Not on file  Social History Narrative   Married   Exercise: No   Education: GED   Social Determinants of Radio broadcast assistant Strain:   . Difficulty of Paying Living Expenses: Not on file  Food Insecurity:   . Worried About Charity fundraiser in the Last Year: Not on file  . Ran Out of Food in the Last Year: Not on file  Transportation Needs:   . Lack of Transportation (Medical): Not on file  . Lack of Transportation (Non-Medical): Not on file  Physical Activity:   . Days of Exercise per Week: Not on file  . Minutes of Exercise per Session: Not on file  Stress:   . Feeling of Stress : Not on file  Social Connections:   . Frequency of Communication with Friends and Family: Not on file  . Frequency of Social Gatherings with Friends and Family: Not on file  . Attends Religious Services: Not on file  . Active Member of Clubs or Organizations: Not on file  . Attends Archivist Meetings: Not on file  . Marital Status: Not on file  Intimate Partner Violence:   . Fear of Current or Ex-Partner: Not on file  . Emotionally Abused: Not on file  . Physically Abused: Not on file  . Sexually Abused: Not on file    Review of Systems   Objective:   Vitals:   03/18/19 0926  BP: 119/76  Pulse: 85  Temp: 97.7 F (36.5 C)  TempSrc: Temporal  SpO2: 99%  Weight: 185 lb (83.9 kg)  Height: 6\' 1"  (1.854 m)     Physical Exam Vitals reviewed.  Constitutional:      General: He is not in acute distress.    Appearance: He is well-developed.  HENT:     Head: Normocephalic and atraumatic.  Eyes:     Conjunctiva/sclera:     Right eye: Right conjunctiva is not injected. No chemosis or exudate.    Left eye: Left conjunctiva is not injected. No chemosis  or exudate.    Comments: Timing, crust on lower left, right leg no appreciable crusting seen otherwise.  No scleral injection, eye exam appears normal otherwise.  Cardiovascular:     Rate and Rhythm: Normal rate.   Pulmonary:     Effort: Pulmonary effort is normal.  Musculoskeletal:     Right lower leg: No edema (no appreciable pedal edema. some dry skin lower right leg. ).     Left lower leg: No edema.  Skin:      Neurological:     Mental Status: He is alert and oriented to person, place, and time.    Assessment & Plan:  TERRI MALERBA is a 74 y.o. male . Abrasion of lower extremity, unspecified laterality, initial encounter PVD (peripheral vascular disease) ([facility])  -No appreciable edema today.  Much improved.  Few abrasions on the right leg but do not appear to be stasis ulceration.  Dry skin care reviewed with Eucerin, topical Polysporin with RTC precautions.  -Possible component of stasis on left lower extremity, but appears to be more consistent with abrasion/irritation from previous CAM Walker.  Has follow-up planned with orthopedics.  Wound care reviewed with keeping areas clean, covered, Polysporin, RTC precautions if any ulceration or worsening.  History of itching of eye  -Reassuring exam at present, possible allergic component versus viral conjunctivitis.  Held on antibiotics or topical treatments at this time as significantly improved.  RTC precautions with me or ophthalmology if recurrence of erythema, discharge.  No orders of the defined types were placed in this encounter.  Patient Instructions    Ok to use over the counter allergy drops for eyes, but if redness returns or disharge returns - be seen that day - eye provider or with me - video visit ok.   Keep wounds on legs clean with soap and water at least once per day, then cover with clean bandage. Polysporin over the counter to any open areas for now. eucerin daily to any dry skin - more if needed. Keep follow up with Dr. Sharol Given as planned for left leg. If any increased redness, ulcers, or other worsening - return right away.   Thanks for coming in today.   Return to the clinic or go to the nearest emergency room if any of your  symptoms worsen or new symptoms occur.    If you have lab work done today you will be contacted with your lab results within the next 2 weeks.  If you have not heard from [place] then please contact us. The fastest way to get your results is to register for My Chart.   IF you received an x-ray today, you will receive an invoice from [facility]. Please contact [facility] at [phone] with questions or concerns regarding your invoice.   IF you received labwork today, you will receive an invoice from [facility]. Please contact [facility] at [phone] with questions or concerns regarding your invoice.   Our billing staff will not be able to assist you with questions regarding bills from these companies.  You will be contacted with the lab results as soon as they are available. The fastest way to get your results is to activate your My Chart account. Instructions are located on the last page of this paperwork. If you have not heard from [place] regarding the results in 2 weeks, please contact this office.         Signed, [name], MD [facility]

## 2019-03-19 ENCOUNTER — Other Ambulatory Visit: Payer: Self-pay

## 2019-03-19 ENCOUNTER — Ambulatory Visit (INDEPENDENT_AMBULATORY_CARE_PROVIDER_SITE_OTHER): Payer: Medicare Other | Admitting: Orthopedic Surgery

## 2019-03-19 VITALS — Ht 73.0 in | Wt 185.0 lb

## 2019-03-19 DIAGNOSIS — L97921 Non-pressure chronic ulcer of unspecified part of left lower leg limited to breakdown of skin: Secondary | ICD-10-CM

## 2019-03-19 DIAGNOSIS — M7989 Other specified soft tissue disorders: Secondary | ICD-10-CM | POA: Diagnosis not present

## 2019-03-19 DIAGNOSIS — I739 Peripheral vascular disease, unspecified: Secondary | ICD-10-CM | POA: Diagnosis not present

## 2019-03-20 ENCOUNTER — Encounter: Payer: Self-pay | Admitting: Orthopedic Surgery

## 2019-03-20 ENCOUNTER — Other Ambulatory Visit: Payer: Self-pay | Admitting: Radiology

## 2019-03-20 ENCOUNTER — Telehealth: Payer: Self-pay | Admitting: Family Medicine

## 2019-03-20 ENCOUNTER — Telehealth: Payer: Medicare Other | Admitting: Family Medicine

## 2019-03-20 DIAGNOSIS — E785 Hyperlipidemia, unspecified: Secondary | ICD-10-CM

## 2019-03-20 DIAGNOSIS — I10 Essential (primary) hypertension: Secondary | ICD-10-CM

## 2019-03-20 MED ORDER — METOPROLOL TARTRATE 50 MG PO TABS: 75.0000 mg | ORAL_TABLET | Freq: Two times a day (BID) | ORAL | 0 refills | Status: DC

## 2019-03-20 NOTE — Progress Notes (Signed)
Office Visit Note   Patient: Seth [name]           Date of Birth: 1945-10-03           MRN: [id number] Visit Date: 03/19/2019              Requested by: [name], MD [address] [place],  [place] [address] PCP: [name], MD  Chief Complaint  Patient presents with  . Left Leg - Wound Check      HPI: Patient is a 74 year old gentleman who was seen for initial evaluation in referral from Dr. Noemi Chapel for ulceration left lower extremity.  Patient states that 3 months ago he sustained an ankle fracture was treated conservatively in a fracture boot and he states he developed skin problems over the lateral aspect of his leg secondary to the fracture boot.  Patient states he does not have a history of diabetes his hemoglobin A1c is slightly elevated at 6.2.  Patient does have a history of hypertension and over 50-pack-year history of tobacco use.  Patient states he is currently on chemotherapy for lung cancer.   Assessment & Plan: Visit Diagnoses:  1. Leg swelling   2. PVD (peripheral vascular disease) ([place])   3. Skin ulcer of left lower leg, limited to breakdown of skin ([place])     Plan: We will set the patient up for ankle-brachial indices and vascular consultation.  Will reevaluate in 2 weeks.  Continue dry dressing change recommending discontinue the ankle stabilizing orthosis at this time and use walker for balance.  Follow-Up Instructions: Return in about 2 weeks (around 04/02/2019).   Ortho Exam  Patient is alert, oriented, no adenopathy, well-dressed, normal affect, normal respiratory effort. Examination patient does not have a palpable dorsalis pedis pulse with the Doppler he has a dampened monophasic dorsalis pedis and posterior tibial pulse.  He has what looks appears to be an extensive ischemic ulcer over the lateral aspect of the left ankle and calf no skin breakdown no ulceration no cellulitis.  Patient does not have any clinical signs of a DVT.  Imaging:  No results found. No images are attached to the encounter.  Labs: Lab Results  Component Value Date   HGBA1C 6.2 (H) 04/23/2017   HGBA1C 6.1 (H) 10/22/2016   HGBA1C 5.9 (H) 04/12/2016   LABURIC 6.9 04/28/2018     Lab Results  Component Value Date   ALBUMIN 3.8 03/17/2019   ALBUMIN 3.7 03/10/2019   ALBUMIN 3.7 03/03/2019   LABURIC 6.9 04/28/2018    No results found for: MG No results found for: VD25OH  No results found for: PREALBUMIN CBC EXTENDED Latest Ref Rng & Units 03/17/2019 03/10/2019 03/03/2019  WBC 4.0 - 10.5 K/uL 2.9(L) 6.3 1.9(L)  RBC 4.22 - 5.81 MIL/uL 4.23 4.29 4.22  HGB 13.0 - 17.0 g/dL 12.4(L) 12.5(L) 12.5(L)  HCT 39.0 - 52.0 % 37.4(L) 38.5(L) 37.7(L)  PLT 150 - 400 K/uL 199 252 79(L)  NEUTROABS 1.7 - 7.7 K/uL 2.1 4.5 0.9(L)  LYMPHSABS 0.7 - 4.0 K/uL 0.7 0.7 0.6(L)     Body mass index is 24.41 kg/m.  Orders:  Orders Placed This Encounter  Procedures  . Ambulatory referral to Vascular Surgery  . VAS [place] ABI WITH/WO TBI   No orders of the defined types were placed in this encounter.    Procedures: No procedures performed  Clinical Data: No additional findings.  ROS:  All other systems negative, except as noted in the HPI. Review  of Systems  Objective: Vital Signs: Ht 6\' 1"  (1.854 m)   Wt 185 lb (83.9 kg)   BMI 24.41 kg/m   Specialty Comments:  No specialty comments available.  PMFS History: Patient Active Problem List   Diagnosis Date Noted  . Adenocarcinoma of left lung, stage 4 ([place]) 02/03/2019  . Encounter for antineoplastic immunotherapy 02/03/2019  . Frequent nosebleeds 05/28/2018  . Encounter for antineoplastic chemotherapy 04/18/2018  . Goals of care, counseling/discussion 04/18/2018  . Adenocarcinoma of left lung, stage 2 ([place]) 03/27/2018  . Abnormal PET of left lung   . S/P bronchoscopy   . Solitary lung nodule 02/20/2018  . Hilar adenopathy 02/20/2018  . Abnormal findings on diagnostic imaging of lung 02/20/2018  .  Platelet inhibition due to Plavix 02/20/2018  . PVD (peripheral vascular disease) ([place]) 10/15/2013  . Tobacco use disorder 10/15/2013  . Pure hypercholesterolemia 10/15/2013  . Coronary artery disease involving native coronary artery of native heart without angina pectoris 10/15/2013  . Cataract 09/29/2013  . Colon cancer screening 09/25/2013  . AAA (abdominal aortic aneurysm) without rupture ([place]) 10/22/2012  . Nicotine addiction 10/17/2012  . Atherosclerosis of native arteries of the extremities with ulceration(440.23) 10/17/2012  . Carotid bruit 10/17/2012  . Other nonspecific abnormal cardiovascular system function study 04/10/2012  . History of sciatica   . Essential hypertension, benign   . Seasonal allergies    Past Medical History:  Diagnosis Date  . AAA (abdominal aortic aneurysm) ([place])   . Allergic rhinitis   . Anxiety   . Back pain   . CAD (coronary artery disease)   . Cataracts, bilateral   . Emphysema, unspecified ([place])     " moderate"  . Essential hypertension, benign   . GERD (gastroesophageal reflux disease)   . Gout   . Headache    migraines  . Heart murmur    as a child only  . Hilar adenopathy   . History of hiatal hernia   . History of sciatica   . Hyperlipidemia   . lung ca dx'd 02/2018  . Lung nodule   . Pneumonia    as child  . Prostate hypertrophy   . PVD (peripheral vascular disease) ([place])   . Seasonal allergies   . Subclavian steal syndrome of left subclavian artery   . Wears dentures   . Wears dentures     Family History  Problem Relation Age of Onset  . Hypertension Mother   . Heart disease Mother   . Stroke Mother   . Heart disease Paternal Grandmother   . Diabetes Paternal Grandmother   . Hyperlipidemia Paternal Grandmother   . Diabetes Sister   . Heart disease Sister   . Hyperlipidemia Sister   . Heart disease Brother   . Hyperlipidemia Brother   . Hypertension Brother   . Diabetes Paternal Grandfather   . Heart disease  Paternal Grandfather   . Hyperlipidemia Paternal Grandfather   . Heart disease Maternal Uncle        x 2  . Cancer Maternal Grandmother        type unknown, ? lung  . Leukemia Maternal Uncle     Past Surgical History:  Procedure Laterality Date  . CARDIAC CATHETERIZATION     stent placement  . COLONOSCOPY W/ BIOPSIES AND POLYPECTOMY    . ILIAC ARTERY STENT    . MULTIPLE TOOTH EXTRACTIONS    . VIDEO BRONCHOSCOPY WITH ENDOBRONCHIAL NAVIGATION N/A 02/26/2018   Procedure: VIDEO BRONCHOSCOPY WITH ENDOBRONCHIAL NAVIGATION;  Surgeon: Garner Nash, DO;  Location: [facility];  Service: Thoracic;  Laterality: N/A;  . VIDEO BRONCHOSCOPY WITH ENDOBRONCHIAL NAVIGATION N/A 03/19/2018   Procedure: VIDEO BRONCHOSCOPY WITH ENDOBRONCHIAL NAVIGATION and endobronchial ultrasound;  Surgeon: Garner Nash, DO;  Location: [facility];  Service: Thoracic;  Laterality: N/A;  . VIDEO BRONCHOSCOPY WITH ENDOBRONCHIAL ULTRASOUND N/A 02/26/2018   Procedure: VIDEO BRONCHOSCOPY WITH ENDOBRONCHIAL ULTRASOUND;  Surgeon: Garner Nash, DO;  Location: [facility];  Service: Thoracic;  Laterality: N/A;  . VIDEO BRONCHOSCOPY WITH ENDOBRONCHIAL ULTRASOUND N/A 03/19/2018   Procedure: NAVIGATION BRONCHOSCOPY;  Surgeon: Garner Nash, DO;  Location: [facility];  Service: Thoracic;  Laterality: N/A;   Social History   Occupational History  . Occupation: retired  Tobacco Use  . Smoking status: Former Smoker    Packs/day: 3.00    Years: 50.00    Pack years: 150.00    Types: Cigarettes    Quit date: 04/05/2016    Years since quitting: 2.9  . Smokeless tobacco: Never Used  Substance and Sexual Activity  . Alcohol use: Yes    Alcohol/week: 0.0 standard drinks    Comment: rare  . Drug use: No  . Sexual activity: Not on file

## 2019-03-20 NOTE — Telephone Encounter (Signed)
metoprolol tartrate metoprolol tartrate (LOPRESSOR) 50 MG tablet   Patient had appointment on 03/18/2019  Says that he did not discuss dosage change with provider and would like to know if its correct and why the change   Please advise

## 2019-03-20 NOTE — Telephone Encounter (Signed)
Has been taking metoprolol 75mg  BID. No recent changes.  New rx sent with correct dosing. Patient advised.

## 2019-03-20 NOTE — Telephone Encounter (Signed)
Patient would like to know if he had the right dosage of Metoprolol , and if so why did it change.  Please Advise.

## 2019-03-23 ENCOUNTER — Ambulatory Visit (HOSPITAL_COMMUNITY): Payer: Medicare Other

## 2019-03-23 ENCOUNTER — Ambulatory Visit (HOSPITAL_COMMUNITY)
Admission: RE | Admit: 2019-03-23 | Discharge: 2019-03-23 | Disposition: A | Discharge status: Home / Self Care | Payer: Medicare Other | Source: Ambulatory Visit | Attending: Internal Medicine | Admitting: Internal Medicine

## 2019-03-23 ENCOUNTER — Other Ambulatory Visit: Payer: Self-pay

## 2019-03-23 ENCOUNTER — Other Ambulatory Visit: Payer: Self-pay | Admitting: Internal Medicine

## 2019-03-23 ENCOUNTER — Encounter (HOSPITAL_COMMUNITY): Payer: Self-pay

## 2019-03-23 DIAGNOSIS — Z5111 Encounter for antineoplastic chemotherapy: Secondary | ICD-10-CM | POA: Diagnosis not present

## 2019-03-23 DIAGNOSIS — Z7902 Long term (current) use of antithrombotics/antiplatelets: Secondary | ICD-10-CM | POA: Insufficient documentation

## 2019-03-23 DIAGNOSIS — C7972 Secondary malignant neoplasm of left adrenal gland: Secondary | ICD-10-CM | POA: Diagnosis not present

## 2019-03-23 DIAGNOSIS — Z79899 Other long term (current) drug therapy: Secondary | ICD-10-CM | POA: Insufficient documentation

## 2019-03-23 DIAGNOSIS — C3492 Malignant neoplasm of unspecified part of left bronchus or lung: Secondary | ICD-10-CM | POA: Diagnosis not present

## 2019-03-23 DIAGNOSIS — C3490 Malignant neoplasm of unspecified part of unspecified bronchus or lung: Secondary | ICD-10-CM | POA: Diagnosis not present

## 2019-03-23 DIAGNOSIS — C3491 Malignant neoplasm of unspecified part of right bronchus or lung: Secondary | ICD-10-CM | POA: Diagnosis not present

## 2019-03-23 DIAGNOSIS — Z452 Encounter for adjustment and management of vascular access device: Secondary | ICD-10-CM | POA: Diagnosis not present

## 2019-03-23 HISTORY — PX: IR IMAGING GUIDED PORT INSERTION: IMG5740

## 2019-03-23 LAB — CBC
HCT: 33.8 % — ABNORMAL LOW (ref 39.0–52.0)
Hemoglobin: 11.3 g/dL — ABNORMAL LOW (ref 13.0–17.0)
MCH: 29.7 pg (ref 26.0–34.0)
MCHC: 33.4 g/dL (ref 30.0–36.0)
MCV: 88.7 fL (ref 80.0–100.0)
Platelets: 75 10*3/uL — ABNORMAL LOW (ref 150–400)
RBC: 3.81 MIL/uL — ABNORMAL LOW (ref 4.22–5.81)
RDW: 13.9 % (ref 11.5–15.5)
WBC: 1.7 10*3/uL — ABNORMAL LOW (ref 4.0–10.5)
nRBC: 0 % (ref 0.0–0.2)

## 2019-03-23 LAB — PROTIME-INR
INR: 1.1 (ref 0.8–1.2)
Prothrombin Time: 14.1 seconds (ref 11.4–15.2)

## 2019-03-23 MED ORDER — MIDAZOLAM HCL 2 MG/2ML IJ SOLN
INTRAMUSCULAR | Status: AC | PRN
  Administered 2019-03-23 (×3): 1 mg via INTRAVENOUS

## 2019-03-23 MED ORDER — LIDOCAINE-EPINEPHRINE 1 %-1:100000 IJ SOLN
INTRAMUSCULAR | Status: AC
  Filled 2019-03-23: qty 1

## 2019-03-23 MED ORDER — CEFAZOLIN SODIUM-DEXTROSE 2-4 GM/100ML-% IV SOLN
INTRAVENOUS | Status: AC
  Administered 2019-03-23: 16:00:00 2 g via INTRAVENOUS
  Filled 2019-03-23: qty 100

## 2019-03-23 MED ORDER — SODIUM CHLORIDE 0.9 % IV SOLN: INTRAVENOUS | Status: DC

## 2019-03-23 MED ORDER — FENTANYL CITRATE (PF) 100 MCG/2ML IJ SOLN
INTRAMUSCULAR | Status: AC | PRN
  Administered 2019-03-23 (×2): 50 ug via INTRAVENOUS

## 2019-03-23 MED ORDER — CEFAZOLIN SODIUM-DEXTROSE 2-4 GM/100ML-% IV SOLN: 2.0000 g | Freq: Once | INTRAVENOUS | Status: AC

## 2019-03-23 MED ORDER — HEPARIN SOD (PORK) LOCK FLUSH 100 UNIT/ML IV SOLN
INTRAVENOUS | Status: AC
  Filled 2019-03-23: qty 5

## 2019-03-23 MED ORDER — MIDAZOLAM HCL 2 MG/2ML IJ SOLN
INTRAMUSCULAR | Status: AC
  Filled 2019-03-23: qty 2

## 2019-03-23 MED ORDER — FENTANYL CITRATE (PF) 100 MCG/2ML IJ SOLN
INTRAMUSCULAR | Status: AC
  Filled 2019-03-23: qty 2

## 2019-03-23 MED ORDER — LIDOCAINE-EPINEPHRINE 1 %-1:100000 IJ SOLN
INTRAMUSCULAR | Status: AC | PRN
  Administered 2019-03-23 (×2): 10 mL

## 2019-03-23 NOTE — H&P (Signed)
Referring Physician(s): Mohamed,Mohamed  Supervising Physician: Sandi Mariscal  Patient Status:  [facility]  Chief Complaint:  "I'm here for a port a cath"  Subjective:  Seth [name] is a 74 year old male who presents today for a scheduled Port-a-Cath placement. Patient was diagnosed with metastatic non-small cell adenocarcinoma lung cancer in February 2020, and recently had biopsy from his adrenal gland that showed metastasis. Patient had chemoradiation, which was completed in May 2020. Later follow up CT showed metastases, which patient has been started on chemotherapy for. Port-a-Cath will be used to continue his treatment.   Today patient reports he is doing well. He knows what he is at [facility] for and the reason why. Patient denies fever, chills, cough, chest pain, vomiting and SOB. He reports some chronic back pain and abdominal pain, and that it is unchanged today. He also reports some nausea, but believes it is from chemotherapy. Patient takes Plavix, but it has been held for this procedure. Procedure methods were explained and risks of procedure were discussed including infection, DVT, bleeding and injury to surrounding structures. Patient has consented.    Allergies: Biaxin [clarithromycin], Clarithromycin, Iodinated diagnostic agents, Prednisone, Cortisone, Flomax [tamsulosin hcl], Hepatitis b virus vaccines, and Iodine  Medications: Prior to Admission medications   Medication Sig Start Date End Date Taking? Authorizing Provider  atorvastatin (LIPITOR) 40 MG tablet Take 1 tablet (40 mg total) by mouth daily. 04/09/18 04/09/19  Sueanne Margarita, MD  cetirizine (ZYRTEC) 10 MG tablet Take 10 mg by mouth daily at 12 noon.    [provider]  clopidogrel (PLAVIX) 75 MG tablet TAKE 1 TABLET BY MOUTH EVERY DAY 02/19/19   [name], MD  famotidine (PEPCID) 20 MG tablet Take 20 mg by mouth 2 (two) times daily. Noon & in the evening.    [provider]  folic acid (FOLVITE) 1  MG tablet Take 1 tablet (1 mg total) by mouth daily. 02/03/19   Curt Bears, MD  lidocaine-prilocaine (EMLA) cream Apply to Port-A-Cath site 30 minutes before infusion. 03/10/19   Curt Bears, MD  lisinopril (ZESTRIL) 10 MG tablet Take 1 tablet (10 mg total) by mouth daily. 09/18/18   [name], MD  metoprolol tartrate (LOPRESSOR) 50 MG tablet Take 1.5 tablets (75 mg total) by mouth 2 (two) times daily. 03/20/19   [name], MD  Olopatadine HCl 0.2 % SOLN 1 drop daily.    [provider]  prochlorperazine (COMPAZINE) 10 MG tablet Take 1 tablet (10 mg total) by mouth every 6 (six) hours as needed for nausea or vomiting. 02/03/19   Curt Bears, MD  sodium chloride 0.9 % SOLN 100 mL with lidocaine-EPINEPHrine 0.5 %-1:200000 SOLN 100 mL     [provider]  terbinafine (LAMISIL) 1 % cream Apply 1 application topically 2 (two) times daily.    [provider]     Vital Signs: BP 114/78   Pulse (!) 107   Temp 97.6 F (36.4 C) (Oral)   Resp 18   SpO2 100%   Physical Exam Cardiovascular:     Rate and Rhythm: Normal rate and regular rhythm.     Pulses: Normal pulses.     Heart sounds: Normal heart sounds.  Pulmonary:     Effort: Pulmonary effort is normal.     Breath sounds: Normal breath sounds.  Abdominal:     General: Abdomen is flat. Bowel sounds are normal.     Palpations: Abdomen is soft.  Neurological:  Mental Status: He is alert and oriented to person, place, and time.     Imaging: No results found.  Labs:  CBC: Recent Labs    02/24/19 0825 03/03/19 1149 03/10/19 1329 03/17/19 1322  WBC 5.5 1.9* 6.3 2.9*  HGB 13.3 12.5* 12.5* 12.4*  HCT 40.4 37.7* 38.5* 37.4*  PLT 171 79* 252 199    COAGS: Recent Labs    01/26/19 0822  INR 1.1    BMP: Recent Labs    02/24/19 0825 03/03/19 1149 03/10/19 1329 03/17/19 1322  NA 134* 136 135 135  K 4.6 3.7 4.0 4.3  CL 101 102 102 101  CO2 24 23 21* 23  GLUCOSE  104* 110* 106* 117*  BUN 17 11 21 23   CALCIUM 8.6* 8.7* 9.1 8.9  CREATININE 1.09 1.08 1.26* 1.26*  GFRNONAA >60 >60 56* 56*  GFRAA >60 >60 >60 >60    LIVER FUNCTION TESTS: Recent Labs    02/24/19 0825 03/03/19 1149 03/10/19 1329 03/17/19 1322  BILITOT 0.7 0.6 0.6 0.8  AST 10* 30 21 16   ALT 8 48* 21 17  ALKPHOS 110 128* 140* 128*  PROT 6.6 6.6 7.2 7.1  ALBUMIN 3.5 3.7 3.7 3.8    Assessment and Plan:  Metastatic non-small cell lung cancer, adenocarcinoma    Patient is here for Port-a-Cath placement for chemotherapy of his metastatic non-small cell lung cancer. Recent adrenal biopsy showed metastasis, and patient has been started on chemotherapy. Patient understands the procedure and what it is for. Risks including bleeding, infection, DVT and injury to surrounding structure were discussed. Patient was informed to avoid showering for 24 hours, driving for the next 24 hours and heavy lifting.   Patient agreed to having procedure done. He can call IR if there are any complications.   Electronically Signed: D. Rowe Robert, PA-C / Donnamarie Rossetti, PA-S 03/23/2019, 1:48 PM   I spent a total of 25 Minutes at the the patient's bedside AND on the patient's hospital floor or unit, greater than 50% of which was counseling/coordinating care for Port-a-Cath Placement

## 2019-03-23 NOTE — Procedures (Signed)
Pre Procedure Dx: Metastatic lung cancer Post Procedural Dx: Same  Successful placement of right IJ approach port-a-cath with tip at the superior caval atrial junction. The catheter is ready for immediate use.  Estimated Blood Loss: Minimal  Complications: None immediate.  Ronny Bacon, MD Pager #: [phone]

## 2019-03-23 NOTE — Discharge Instructions (Signed)
m Moderate Conscious Sedation, Adult, Care After These instructions provide you with information about caring for yourself after your procedure. Your health care provider may also give you more specific instructions. Your treatment has been planned according to current medical practices, but problems sometimes occur. Call your health care provider if you have any problems or questions after your procedure. What can I expect after the procedure? After your procedure, it is common:  To feel sleepy for several hours.  To feel clumsy and have poor balance for several hours.  To have poor judgment for several hours.  To vomit if you eat too soon. Follow these instructions at home: For at least 24 hours after the procedure:   Do not: ? Participate in activities where you could fall or become injured. ? Drive. ? Use heavy machinery. ? Drink alcohol. ? Take sleeping pills or medicines that cause drowsiness. ? Make important decisions or sign legal documents. ? Take care of children on your own.  Rest. Eating and drinking  Follow the diet recommended by your health care provider.  If you vomit: ? Drink water, juice, or soup when you can drink without vomiting. ? Make sure you have little or no nausea before eating solid foods. General instructions  Have a responsible adult stay with you until you are awake and alert.  Take over-the-counter and prescription medicines only as told by your health care provider.  If you smoke, do not smoke without supervision.  Keep all follow-up visits as told by your health care provider. This is important. Contact a health care provider if:  You keep feeling nauseous or you keep vomiting.  You feel light-headed.  You develop a rash.  You have a fever. Get help right away if:  You have trouble breathing. This information is not intended to replace advice given to you by your health care provider. Make sure you discuss any questions you  have with your health care provider. Document Revised: 01/04/2017 Document Reviewed: 05/14/2015 Elsevier Patient Education  [place] An implanted port is a device that is placed under the skin. It is usually placed in the chest. The device can be used to give IV medicine, to take blood, or for dialysis. You may have an implanted port if:  You need IV medicine that would be irritating to the small veins in your hands or arms.  You need IV medicines, such as antibiotics, for a long period of time.  You need IV nutrition for a long period of time.  You need dialysis. Having a port means that your health care provider will not need to use the veins in your arms for these procedures. You may have fewer limitations when using a port than you would if you used other types of long-term IVs, and you will likely be able to return to normal activities after your incision heals. An implanted port has two main parts:  Reservoir. The reservoir is the part where a needle is inserted to give medicines or draw blood. The reservoir is round. After it is placed, it appears as a small, raised area under your skin.  Catheter. The catheter is a thin, flexible tube that connects the reservoir to a vein. Medicine that is inserted into the reservoir goes into the catheter and then into the vein. How is my port accessed? To access your port:  A numbing cream may be placed on the skin over the port site.  Your health care  provider will put on a mask and sterile gloves.  The skin over your port will be cleaned carefully with a germ-killing soap and allowed to dry.  Your health care provider will gently pinch the port and insert a needle into it.  Your health care provider will check for a blood return to make sure the port is in the vein and is not clogged.  If your port needs to remain accessed to get medicine continuously (constant infusion), your health care provider will  place a clear bandage (dressing) over the needle site. The dressing and needle will need to be changed every week, or as told by your health care provider. What is flushing? Flushing helps keep the port from getting clogged. Follow instructions from your health care provider about how and when to flush the port. Ports are usually flushed with saline solution or a medicine called heparin. The need for flushing will depend on how the port is used:  If the port is only used from time to time to give medicines or draw blood, the port may need to be flushed: ? Before and after medicines have been given. ? Before and after blood has been drawn. ? As part of routine maintenance. Flushing may be recommended every 4-6 weeks.  If a constant infusion is running, the port may not need to be flushed.  Throw away any syringes in a disposal container that is meant for sharp items (sharps container). You can buy a sharps container from a pharmacy, or you can make one by using an empty hard plastic bottle with a cover. How long will my port stay implanted? The port can stay in for as long as your health care provider thinks it is needed. When it is time for the port to come out, a surgery will be done to remove it. The surgery will be similar to the procedure that was done to put the port in. Follow these instructions at home:   Flush your port as told by your health care provider.  If you need an infusion over several days, follow instructions from your health care provider about how to take care of your port site. Make sure you: ? Wash your hands with soap and water before you change your dressing. If soap and water are not available, use alcohol-based hand sanitizer. ? Change your dressing as told by your health care provider. ? Place any used dressings or infusion bags into a plastic bag. Throw that bag in the trash. ? Keep the dressing that covers the needle clean and dry. Do not get it wet. ? Do not use  scissors or sharp objects near the tube. ? Keep the tube clamped, unless it is being used.  Check your port site every day for signs of infection. Check for: ? Redness, swelling, or pain. ? Fluid or blood. ? Pus or a bad smell.  Protect the skin around the port site. ? Avoid wearing bra straps that rub or irritate the site. ? Protect the skin around your port from seat belts. Place a soft pad over your chest if needed.  Bathe or shower as told by your health care provider. The site may get wet as long as you are not actively receiving an infusion.  Return to your normal activities as told by your health care provider. Ask your health care provider what activities are safe for you.  Carry a medical alert card or wear a medical alert bracelet at all times. This  will let health care providers know that you have an implanted port in case of an emergency. Get help right away if:  You have redness, swelling, or pain at the port site.  You have fluid or blood coming from your port site.  You have pus or a bad smell coming from the port site.  You have a fever. Summary  Implanted ports are usually placed in the chest for long-term IV access.  Follow instructions from your health care provider about flushing the port and changing bandages (dressings).  Take care of the area around your port by avoiding clothing that puts pressure on the area, and by watching for signs of infection.  Protect the skin around your port from seat belts. Place a soft pad over your chest if needed.  Get help right away if you have a fever or you have redness, swelling, pain, drainage, or a bad smell at the port site. This information is not intended to replace advice given to you by your health care provider. Make sure you discuss any questions you have with your health care provider. Document Revised: 05/16/2018 Document Reviewed: 02/25/2016 Elsevier Patient Education  2020 [employer].

## 2019-03-24 ENCOUNTER — Other Ambulatory Visit: Payer: Self-pay

## 2019-03-24 ENCOUNTER — Telehealth (HOSPITAL_COMMUNITY): Payer: Self-pay

## 2019-03-24 ENCOUNTER — Inpatient Hospital Stay: Payer: Medicare Other

## 2019-03-24 DIAGNOSIS — R5383 Other fatigue: Secondary | ICD-10-CM | POA: Diagnosis not present

## 2019-03-24 DIAGNOSIS — C7972 Secondary malignant neoplasm of left adrenal gland: Secondary | ICD-10-CM | POA: Diagnosis not present

## 2019-03-24 DIAGNOSIS — C3411 Malignant neoplasm of upper lobe, right bronchus or lung: Secondary | ICD-10-CM | POA: Diagnosis not present

## 2019-03-24 DIAGNOSIS — Z5112 Encounter for antineoplastic immunotherapy: Secondary | ICD-10-CM | POA: Diagnosis not present

## 2019-03-24 DIAGNOSIS — C3492 Malignant neoplasm of unspecified part of left bronchus or lung: Secondary | ICD-10-CM

## 2019-03-24 DIAGNOSIS — C7971 Secondary malignant neoplasm of right adrenal gland: Secondary | ICD-10-CM | POA: Diagnosis not present

## 2019-03-24 DIAGNOSIS — Z5111 Encounter for antineoplastic chemotherapy: Secondary | ICD-10-CM | POA: Diagnosis not present

## 2019-03-24 LAB — CMP (CANCER CENTER ONLY)
ALT: 24 U/L (ref 0–44)
AST: 25 U/L (ref 15–41)
Albumin: 3.7 g/dL (ref 3.5–5.0)
Alkaline Phosphatase: 144 U/L — ABNORMAL HIGH (ref 38–126)
Anion gap: 11 (ref 5–15)
BUN: 19 mg/dL (ref 8–23)
CO2: 24 mmol/L (ref 22–32)
Calcium: 9.1 mg/dL (ref 8.9–10.3)
Chloride: 103 mmol/L (ref 98–111)
Creatinine: 1.18 mg/dL (ref 0.61–1.24)
GFR, Est AFR Am: >60 mL/min (ref >60)
GFR, Estimated: >60 mL/min (ref >60)
Glucose, Bld: 104 mg/dL — ABNORMAL HIGH (ref 70–99)
Potassium: 3.7 mmol/L (ref 3.5–5.1)
Sodium: 138 mmol/L (ref 135–145)
Total Bilirubin: 0.6 mg/dL (ref 0.3–1.2)
Total Protein: 7.2 g/dL (ref 6.5–8.1)

## 2019-03-24 LAB — CBC WITH DIFFERENTIAL (CANCER CENTER ONLY)
Abs Immature Granulocytes: 0.01 10*3/uL (ref 0.00–0.07)
Basophils Absolute: 0 10*3/uL (ref 0.0–0.1)
Basophils Relative: 0 %
Eosinophils Absolute: 0.1 10*3/uL (ref 0.0–0.5)
Eosinophils Relative: 3 %
HCT: 33.7 % — ABNORMAL LOW (ref 39.0–52.0)
Hemoglobin: 11.4 g/dL — ABNORMAL LOW (ref 13.0–17.0)
Immature Granulocytes: 0 %
Lymphocytes Relative: 28 %
Lymphs Abs: 0.7 10*3/uL (ref 0.7–4.0)
MCH: 29.8 pg (ref 26.0–34.0)
MCHC: 33.8 g/dL (ref 30.0–36.0)
MCV: 88.2 fL (ref 80.0–100.0)
Monocytes Absolute: 0.8 10*3/uL (ref 0.1–1.0)
Monocytes Relative: 34 %
Neutro Abs: 0.9 10*3/uL — ABNORMAL LOW (ref 1.7–7.7)
Neutrophils Relative %: 35 %
Platelet Count: 84 10*3/uL — ABNORMAL LOW (ref 150–400)
RBC: 3.82 MIL/uL — ABNORMAL LOW (ref 4.22–5.81)
RDW: 13.9 % (ref 11.5–15.5)
WBC Count: 2.5 10*3/uL — ABNORMAL LOW (ref 4.0–10.5)
nRBC: 0 % (ref 0.0–0.2)

## 2019-03-24 NOTE — Telephone Encounter (Signed)

## 2019-03-25 ENCOUNTER — Ambulatory Visit (HOSPITAL_COMMUNITY)
Admission: RE | Admit: 2019-03-25 | Discharge: 2019-03-25 | Disposition: A | Discharge status: Home / Self Care | Payer: Medicare Other | Source: Ambulatory Visit | Attending: Orthopedic Surgery | Admitting: Orthopedic Surgery

## 2019-03-25 DIAGNOSIS — M7989 Other specified soft tissue disorders: Secondary | ICD-10-CM | POA: Insufficient documentation

## 2019-03-26 ENCOUNTER — Ambulatory Visit: Payer: Medicare Other | Admitting: Orthopedic Surgery

## 2019-03-27 ENCOUNTER — Telehealth: Payer: Self-pay | Admitting: *Deleted

## 2019-03-27 DIAGNOSIS — M6281 Muscle weakness (generalized): Secondary | ICD-10-CM | POA: Diagnosis not present

## 2019-03-27 DIAGNOSIS — M25672 Stiffness of left ankle, not elsewhere classified: Secondary | ICD-10-CM | POA: Diagnosis not present

## 2019-03-27 DIAGNOSIS — S82832D Other fracture of upper and lower end of left fibula, subsequent encounter for closed fracture with routine healing: Secondary | ICD-10-CM | POA: Diagnosis not present

## 2019-03-27 DIAGNOSIS — R262 Difficulty in walking, not elsewhere classified: Secondary | ICD-10-CM | POA: Diagnosis not present

## 2019-03-27 NOTE — Telephone Encounter (Signed)
Received vm message from patient regarding his eligibility for the covid vaccine.   TCT patient and spoke with him. Per Dr. Julien Nordmann, pt is fine to receive the Covid 19 vaccine.  Pt voiced understanding.  No further questions or concerns

## 2019-03-31 ENCOUNTER — Encounter: Payer: Self-pay | Admitting: Medical Oncology

## 2019-03-31 ENCOUNTER — Telehealth: Payer: Self-pay | Admitting: Family Medicine

## 2019-03-31 ENCOUNTER — Other Ambulatory Visit: Payer: Self-pay

## 2019-03-31 ENCOUNTER — Inpatient Hospital Stay (HOSPITAL_BASED_OUTPATIENT_CLINIC_OR_DEPARTMENT_OTHER): Payer: Medicare Other | Admitting: Internal Medicine

## 2019-03-31 ENCOUNTER — Inpatient Hospital Stay: Payer: Medicare Other

## 2019-03-31 ENCOUNTER — Encounter: Payer: Self-pay | Admitting: Internal Medicine

## 2019-03-31 ENCOUNTER — Telehealth: Payer: Self-pay

## 2019-03-31 VITALS — HR 100

## 2019-03-31 VITALS — BP 97/61 | HR 115 | Temp 98.3°F | Resp 18 | Ht 73.0 in | Wt 186.8 lb

## 2019-03-31 DIAGNOSIS — C3411 Malignant neoplasm of upper lobe, right bronchus or lung: Secondary | ICD-10-CM | POA: Diagnosis not present

## 2019-03-31 DIAGNOSIS — C7971 Secondary malignant neoplasm of right adrenal gland: Secondary | ICD-10-CM | POA: Diagnosis not present

## 2019-03-31 DIAGNOSIS — I10 Essential (primary) hypertension: Secondary | ICD-10-CM

## 2019-03-31 DIAGNOSIS — Z5112 Encounter for antineoplastic immunotherapy: Secondary | ICD-10-CM | POA: Diagnosis not present

## 2019-03-31 DIAGNOSIS — C3492 Malignant neoplasm of unspecified part of left bronchus or lung: Secondary | ICD-10-CM

## 2019-03-31 DIAGNOSIS — Z5111 Encounter for antineoplastic chemotherapy: Secondary | ICD-10-CM | POA: Diagnosis not present

## 2019-03-31 DIAGNOSIS — R5382 Chronic fatigue, unspecified: Secondary | ICD-10-CM

## 2019-03-31 DIAGNOSIS — C7972 Secondary malignant neoplasm of left adrenal gland: Secondary | ICD-10-CM | POA: Diagnosis not present

## 2019-03-31 DIAGNOSIS — C3490 Malignant neoplasm of unspecified part of unspecified bronchus or lung: Secondary | ICD-10-CM | POA: Diagnosis not present

## 2019-03-31 DIAGNOSIS — R5383 Other fatigue: Secondary | ICD-10-CM | POA: Diagnosis not present

## 2019-03-31 LAB — CMP (CANCER CENTER ONLY)
ALT: 12 U/L (ref 0–44)
AST: 18 U/L (ref 15–41)
Albumin: 3.5 g/dL (ref 3.5–5.0)
Alkaline Phosphatase: 134 U/L — ABNORMAL HIGH (ref 38–126)
Anion gap: 11 (ref 5–15)
BUN: 12 mg/dL (ref 8–23)
CO2: 24 mmol/L (ref 22–32)
Calcium: 9.1 mg/dL (ref 8.9–10.3)
Chloride: 101 mmol/L (ref 98–111)
Creatinine: 1.04 mg/dL (ref 0.61–1.24)
GFR, Est AFR Am: >60 mL/min (ref >60)
GFR, Estimated: >60 mL/min (ref >60)
Glucose, Bld: 124 mg/dL — ABNORMAL HIGH (ref 70–99)
Potassium: 3.7 mmol/L (ref 3.5–5.1)
Sodium: 136 mmol/L (ref 135–145)
Total Bilirubin: 0.6 mg/dL (ref 0.3–1.2)
Total Protein: 7.5 g/dL (ref 6.5–8.1)

## 2019-03-31 LAB — CBC WITH DIFFERENTIAL (CANCER CENTER ONLY)
Abs Immature Granulocytes: 0.04 10*3/uL (ref 0.00–0.07)
Basophils Absolute: 0 10*3/uL (ref 0.0–0.1)
Basophils Relative: 0 %
Eosinophils Absolute: 0.1 10*3/uL (ref 0.0–0.5)
Eosinophils Relative: 2 %
HCT: 33.2 % — ABNORMAL LOW (ref 39.0–52.0)
Hemoglobin: 11.1 g/dL — ABNORMAL LOW (ref 13.0–17.0)
Immature Granulocytes: 1 %
Lymphocytes Relative: 15 %
Lymphs Abs: 0.8 10*3/uL (ref 0.7–4.0)
MCH: 29.7 pg (ref 26.0–34.0)
MCHC: 33.4 g/dL (ref 30.0–36.0)
MCV: 88.8 fL (ref 80.0–100.0)
Monocytes Absolute: 0.6 10*3/uL (ref 0.1–1.0)
Monocytes Relative: 11 %
Neutro Abs: 3.9 10*3/uL (ref 1.7–7.7)
Neutrophils Relative %: 71 %
Platelet Count: 220 10*3/uL (ref 150–400)
RBC: 3.74 MIL/uL — ABNORMAL LOW (ref 4.22–5.81)
RDW: 15.1 % (ref 11.5–15.5)
WBC Count: 5.4 10*3/uL (ref 4.0–10.5)
nRBC: 0 % (ref 0.0–0.2)

## 2019-03-31 LAB — TSH: TSH: 2.23 u[IU]/mL (ref 0.320–4.118)

## 2019-03-31 MED ORDER — DEXAMETHASONE SODIUM PHOSPHATE 10 MG/ML IJ SOLN
INTRAMUSCULAR | Status: AC
  Filled 2019-03-31: qty 1

## 2019-03-31 MED ORDER — SODIUM CHLORIDE 0.9 % IV SOLN
500.0000 mg/m2 | Freq: Once | INTRAVENOUS | Status: AC
  Administered 2019-03-31: 1100 mg via INTRAVENOUS
  Filled 2019-03-31: qty 40

## 2019-03-31 MED ORDER — SODIUM CHLORIDE 0.9 % IV SOLN
Freq: Once | INTRAVENOUS | Status: AC
  Filled 2019-03-31: qty 250

## 2019-03-31 MED ORDER — DIPHENHYDRAMINE HCL 50 MG/ML IJ SOLN
INTRAMUSCULAR | Status: AC
  Filled 2019-03-31: qty 1

## 2019-03-31 MED ORDER — DIPHENHYDRAMINE HCL 50 MG/ML IJ SOLN
25.0000 mg | Freq: Once | INTRAMUSCULAR | Status: AC
  Administered 2019-03-31: 15:00:00 25 mg via INTRAVENOUS

## 2019-03-31 MED ORDER — SODIUM CHLORIDE 0.9 % IV SOLN
150.0000 mg | Freq: Once | INTRAVENOUS | Status: AC
  Administered 2019-03-31: 150 mg via INTRAVENOUS
  Filled 2019-03-31: qty 150

## 2019-03-31 MED ORDER — SODIUM CHLORIDE 0.9 % IV SOLN
200.0000 mg | Freq: Once | INTRAVENOUS | Status: AC
  Administered 2019-03-31: 16:00:00 200 mg via INTRAVENOUS
  Filled 2019-03-31: qty 8

## 2019-03-31 MED ORDER — CYANOCOBALAMIN 1000 MCG/ML IJ SOLN
INTRAMUSCULAR | Status: AC
  Filled 2019-03-31: qty 1

## 2019-03-31 MED ORDER — HEPARIN SOD (PORK) LOCK FLUSH 100 UNIT/ML IV SOLN
500.0000 [IU] | Freq: Once | INTRAVENOUS | Status: AC | PRN
  Administered 2019-03-31: 17:00:00 500 [IU]
  Filled 2019-03-31: qty 5

## 2019-03-31 MED ORDER — PALONOSETRON HCL INJECTION 0.25 MG/5ML
INTRAVENOUS | Status: AC
  Filled 2019-03-31: qty 5

## 2019-03-31 MED ORDER — PALONOSETRON HCL INJECTION 0.25 MG/5ML
0.2500 mg | Freq: Once | INTRAVENOUS | Status: AC
  Administered 2019-03-31: 0.25 mg via INTRAVENOUS

## 2019-03-31 MED ORDER — CYANOCOBALAMIN 1000 MCG/ML IJ SOLN
1000.0000 ug | Freq: Once | INTRAMUSCULAR | Status: AC
  Administered 2019-03-31: 16:00:00 1000 ug via INTRAMUSCULAR

## 2019-03-31 MED ORDER — SODIUM CHLORIDE 0.9% FLUSH
10.0000 mL | INTRAVENOUS | Status: DC | PRN
  Administered 2019-03-31: 10 mL
  Filled 2019-03-31: qty 10

## 2019-03-31 MED ORDER — DEXAMETHASONE SODIUM PHOSPHATE 10 MG/ML IJ SOLN
5.0000 mg | Freq: Once | INTRAMUSCULAR | Status: AC
  Administered 2019-03-31: 5 mg via INTRAVENOUS

## 2019-03-31 MED ORDER — SODIUM CHLORIDE 0.9 % IV SOLN
452.5000 mg | Freq: Once | INTRAVENOUS | Status: AC
  Administered 2019-03-31: 17:00:00 450 mg via INTRAVENOUS
  Filled 2019-03-31: qty 45

## 2019-03-31 NOTE — Patient Instructions (Signed)
[facility] Discharge Instructions for Patients Receiving Chemotherapy  Today you received the following chemotherapy agents: Keytruda, Alimta, Carboplatin   To help prevent nausea and vomiting after your treatment, we encourage you to take your nausea medication as directed.    If you develop nausea and vomiting that is not controlled by your nausea medication, call the clinic.   BELOW ARE SYMPTOMS THAT SHOULD BE REPORTED IMMEDIATELY:  *FEVER GREATER THAN 100.5 F  *CHILLS WITH OR WITHOUT FEVER  NAUSEA AND VOMITING THAT IS NOT CONTROLLED WITH YOUR NAUSEA MEDICATION  *UNUSUAL SHORTNESS OF BREATH  *UNUSUAL BRUISING OR BLEEDING  TENDERNESS IN MOUTH AND THROAT WITH OR WITHOUT PRESENCE OF ULCERS  *URINARY PROBLEMS  *BOWEL PROBLEMS  UNUSUAL RASH Items with * indicate a potential emergency and should be followed up as soon as possible.  Feel free to call the clinic should you have any questions or concerns. The clinic phone number is [phone].  Please show the [facility] at check-in to the Emergency Department and triage nurse.

## 2019-03-31 NOTE — Telephone Encounter (Signed)
Please call patient and check status.  I received a note that his blood pressure was low today at 97/51 with elevated heart rate at 115.  That is without taking his lisinopril and metoprolol. Blood count today looks ok. Has he been sick? Decreased fluids?  Hold lisinopril and metoprolol tonight and tomorrow morning.  TeleMed visit with me either at 8:00 or 10:00 tomorrow morning, okay to double book either 1 of those.  If any lightheadedness, dizziness or worsening symptoms tonight, should be seen in the emergency room.

## 2019-03-31 NOTE — Telephone Encounter (Signed)
Pt is scheduled for telemed 8am in the morning

## 2019-03-31 NOTE — Progress Notes (Signed)
    [facility] [phone]   [phone]  OFFICE PROGRESS NOTE  Greene, Jeffrey R, MD [address] [place] [address]  DIAGNOSIS: Metastatic non-small cell lung cancer, adenocarcinoma initially diagnosed as stage IIB (T1c, N1, M0) non-small cell lung cancer favoring adenocarcinoma presented with right upper lobe lung nodule in addition to right hilar lymphadenopathy diagnosed in February 2020.  The patient is not a good surgical candidate for resection.  He had evidence for disease recurrence in December 2020 with adrenal gland metastasis.  PRIOR THERAPY: Concurrent chemoradiation with weekly carboplatin for AUC of 2 and paclitaxel 45 mg/M2.  First dose May 05, 2018.  Status post 7 cycles.  Last dose was given Jun 16, 2018  CURRENT THERAPY: Systemic chemotherapy with carboplatin for AUC of 5, Alimta 500 mg/M2 and Keytruda 200 mg IV every 3 weeks.  First dose starting February 17, 2019. Status post 2 cycles.  INTERVAL HISTORY: Thoams R Car 74 y.o. male returns to the clinic today for follow-up visit.  The patient is feeling fine except for fatigue.  He tolerated the second cycle of his treatment with carboplatin, Alimta and Keytruda fairly well except for the skin rash that lasted for 3 days after his treatment.  The patient denied having any fever or chills.  He has no significant weight loss or night sweats.  He has no nausea, vomiting, diarrhea or constipation.  He has no headache or visual changes.  He is here today for evaluation before starting cycle #3 of his treatment.  MEDICAL HISTORY: Past Medical History:  Diagnosis Date  . AAA (abdominal aortic aneurysm) (HCC)   . Allergic rhinitis   . Anxiety   . Back pain   . CAD (coronary artery disease)   . Cataracts, bilateral   . Emphysema, unspecified (HCC)     " moderate"  . Essential hypertension, benign   . GERD (gastroesophageal reflux disease)   . Gout   . Headache    migraines  .  Heart murmur    as a child only  . Hilar adenopathy   . History of hiatal hernia   . History of sciatica   . Hyperlipidemia   . lung ca dx'd 02/2018  . Lung nodule   . Pneumonia    as child  . Prostate hypertrophy   . PVD (peripheral vascular disease) (HCC)   . Seasonal allergies   . Subclavian steal syndrome of left subclavian artery   . Wears dentures   . Wears dentures     ALLERGIES:  is allergic to biaxin [clarithromycin]; clarithromycin; iodinated diagnostic agents; prednisone; cortisone; flomax [tamsulosin hcl]; hepatitis b virus vaccines; and iodine.  MEDICATIONS:  Current Outpatient Medications  Medication Sig Dispense Refill  . lisinopril (ZESTRIL) 10 MG tablet Take 1 tablet (10 mg total) by mouth daily. 90 tablet 1  . metoprolol tartrate (LOPRESSOR) 50 MG tablet Take 1.5 tablets (75 mg total) by mouth 2 (two) times daily. 270 tablet 0  . atorvastatin (LIPITOR) 40 MG tablet Take 1 tablet (40 mg total) by mouth daily. 90 tablet 3  . cetirizine (ZYRTEC) 10 MG tablet Take 10 mg by mouth daily at 12 noon.    . clopidogrel (PLAVIX) 75 MG tablet TAKE 1 TABLET BY MOUTH EVERY DAY 30 tablet 2  . famotidine (PEPCID) 20 MG tablet Take 20 mg by mouth 2 (two) times daily. Noon & in the evening.    . folic acid (FOLVITE) 1 MG tablet Take 1   tablet (1 mg total) by mouth daily. 30 tablet 4  . lidocaine-prilocaine (EMLA) cream Apply to Port-A-Cath site 30 minutes before infusion. 30 g 0  . Olopatadine HCl 0.2 % SOLN 1 drop daily.    . prochlorperazine (COMPAZINE) 10 MG tablet Take 1 tablet (10 mg total) by mouth every 6 (six) hours as needed for nausea or vomiting. 30 tablet 0  . sodium chloride 0.9 % SOLN 100 mL with lidocaine-EPINEPHrine 0.5 %-1:200000 SOLN 100 mL     . terbinafine (LAMISIL) 1 % cream Apply 1 application topically 2 (two) times daily.     Current Facility-Administered Medications  Medication Dose Route Frequency Provider Last Rate Last Admin  . 0.9 %  sodium chloride  infusion  500 mL Intravenous Once Perry, John N, MD        SURGICAL HISTORY:  Past Surgical History:  Procedure Laterality Date  . CARDIAC CATHETERIZATION     stent placement  . COLONOSCOPY W/ BIOPSIES AND POLYPECTOMY    . ILIAC ARTERY STENT    . IR IMAGING GUIDED PORT INSERTION  03/23/2019  . MULTIPLE TOOTH EXTRACTIONS    . VIDEO BRONCHOSCOPY WITH ENDOBRONCHIAL NAVIGATION N/A 02/26/2018   Procedure: VIDEO BRONCHOSCOPY WITH ENDOBRONCHIAL NAVIGATION;  Surgeon: Icard, Bradley L, DO;  Location: [facility] OR;  Service: Thoracic;  Laterality: N/A;  . VIDEO BRONCHOSCOPY WITH ENDOBRONCHIAL NAVIGATION N/A 03/19/2018   Procedure: VIDEO BRONCHOSCOPY WITH ENDOBRONCHIAL NAVIGATION and endobronchial ultrasound;  Surgeon: Icard, Bradley L, DO;  Location: [facility] OR;  Service: Thoracic;  Laterality: N/A;  . VIDEO BRONCHOSCOPY WITH ENDOBRONCHIAL ULTRASOUND N/A 02/26/2018   Procedure: VIDEO BRONCHOSCOPY WITH ENDOBRONCHIAL ULTRASOUND;  Surgeon: Icard, Bradley L, DO;  Location: [facility] OR;  Service: Thoracic;  Laterality: N/A;  . VIDEO BRONCHOSCOPY WITH ENDOBRONCHIAL ULTRASOUND N/A 03/19/2018   Procedure: NAVIGATION BRONCHOSCOPY;  Surgeon: Icard, Bradley L, DO;  Location: [facility] OR;  Service: Thoracic;  Laterality: N/A;    REVIEW OF SYSTEMS:  A comprehensive review of systems was negative except for: Constitutional: positive for fatigue   PHYSICAL EXAMINATION: General appearance: alert, cooperative, fatigued and no distress Head: Normocephalic, without obvious abnormality, atraumatic Neck: no adenopathy, no JVD, supple, symmetrical, trachea midline and thyroid not enlarged, symmetric, no tenderness/mass/nodules Lymph nodes: Cervical, supraclavicular, and axillary nodes normal. Resp: clear to auscultation bilaterally Back: symmetric, no curvature. ROM normal. No CVA tenderness. Cardio: regular rate and rhythm, S1, S2 normal, no murmur, click, rub or gallop GI: soft, non-tender; bowel sounds normal; no masses,  no  organomegaly Extremities: extremities normal, atraumatic, no cyanosis or edema  ECOG PERFORMANCE STATUS: 1 - Symptomatic but completely ambulatory  Blood pressure 97/61, pulse (!) 115, temperature 98.3 F (36.8 C), resp. rate 18, height 6' 1" (1.854 m), weight 186 lb 12.8 oz (84.7 kg), SpO2 100 %.   LABORATORY DATA: Lab Results  Component Value Date   WBC 5.4 03/31/2019   HGB 11.1 (L) 03/31/2019   HCT 33.2 (L) 03/31/2019   MCV 88.8 03/31/2019   PLT 220 03/31/2019      Chemistry      Component Value Date/Time   NA 138 03/24/2019 1414   NA 133 (L) 01/19/2019 1457   K 3.7 03/24/2019 1414   CL 103 03/24/2019 1414   CO2 24 03/24/2019 1414   BUN 19 03/24/2019 1414   BUN 9 01/19/2019 1457   CREATININE 1.18 03/24/2019 1414   CREATININE 1.09 03/23/2015 0928      Component Value Date/Time   CALCIUM 9.1 03/24/2019 1414   ALKPHOS 144 (  H) 03/24/2019 1414   AST 25 03/24/2019 1414   ALT 24 03/24/2019 1414   BILITOT 0.6 03/24/2019 1414       RADIOGRAPHIC STUDIES: VAS [place] ABI WITH/WO TBI  Result Date: 03/25/2019 LOWER EXTREMITY DOPPLER STUDY Indications: Peripheral artery disease. High Risk Factors: Hypertension, past history of smoking.  Vascular Interventions: Bilateral iliac stents within the last 2 months at an                         outside facility according to the patient. Performing Technologist: Delorise Shiner RVT  Examination Guidelines: A complete evaluation includes at minimum, Doppler waveform signals and systolic blood pressure reading at the level of bilateral brachial, anterior tibial, and posterior tibial arteries, when vessel segments are accessible. Bilateral testing is considered an integral part of a complete examination. Photoelectric Plethysmograph (PPG) waveforms and toe systolic pressure readings are included as required and additional duplex testing as needed. Limited examinations for reoccurring indications may be performed as noted.  ABI Findings:  +---------+------------------+-----+----------+--------+ Right    Rt Pressure (mmHg)IndexWaveform  Comment  +---------+------------------+-----+----------+--------+ Brachial 122                                       +---------+------------------+-----+----------+--------+ ATA      77                0.63                    +---------+------------------+-----+----------+--------+ PTA      76                0.62 monophasic         +---------+------------------+-----+----------+--------+ DP                              monophasic         +---------+------------------+-----+----------+--------+ Great Toe19                0.16                    +---------+------------------+-----+----------+--------+ +---------+------------------+-----+----------+-------+ Left     Lt Pressure (mmHg)IndexWaveform  Comment +---------+------------------+-----+----------+-------+ Brachial 86                                       +---------+------------------+-----+----------+-------+ ATA      78                0.64                   +---------+------------------+-----+----------+-------+ PTA      69                0.57 monophasic        +---------+------------------+-----+----------+-------+ DP                              monophasic        +---------+------------------+-----+----------+-------+ Great Toe16                0.13                   +---------+------------------+-----+----------+-------+ +-------+-----------+-----------+------------+------------+ ABI/TBIToday's ABIToday's TBIPrevious ABIPrevious TBI +-------+-----------+-----------+------------+------------+ Right  0.63  0.16                                +-------+-----------+-----------+------------+------------+ Left   0.64       0.13                                +-------+-----------+-----------+------------+------------+  Summary: Right: Resting right ankle-brachial index  indicates moderate right lower extremity arterial disease. The right toe-brachial index is abnormal. RT great toe pressure = 19 mmHg. Left: Resting left ankle-brachial index indicates moderate left lower extremity arterial disease. The left toe-brachial index is abnormal. LT Great toe pressure = 16 mmHg.  *See table(s) above for measurements and observations.  Electronically signed by Christopher Dickson MD on 03/25/2019 at 4:48:03 PM.    Final    IR IMAGING GUIDED PORT INSERTION  Result Date: 03/23/2019 INDICATION: History of metastatic lung cancer. In need of durable intravenous access for chemotherapy administration EXAM: IMPLANTED PORT A CATH PLACEMENT WITH ULTRASOUND AND FLUOROSCOPIC GUIDANCE COMPARISON:  PET-CT-01/22/2019 MEDICATIONS: Ancef 2 gm IV; The antibiotic was administered within an appropriate time interval prior to skin puncture. ANESTHESIA/SEDATION: Moderate (conscious) sedation was employed during this procedure. A total of Versed 3 mg and Fentanyl 100 mcg was administered intravenously. Moderate Sedation Time: 25 minutes. The patient's level of consciousness and vital signs were monitored continuously by radiology nursing throughout the procedure under my direct supervision. CONTRAST:  None FLUOROSCOPY TIME:  24 seconds (6 mGy) COMPLICATIONS: None immediate. PROCEDURE: The procedure, risks, benefits, and alternatives were explained to the patient. Questions regarding the procedure were encouraged and answered. The patient understands and consents to the procedure. The right neck and chest were prepped with chlorhexidine in a sterile fashion, and a sterile drape was applied covering the operative field. Maximum barrier sterile technique with sterile gowns and gloves were used for the procedure. A timeout was performed prior to the initiation of the procedure. Local anesthesia was provided with 1% lidocaine with epinephrine. After creating a small venotomy incision, a micropuncture kit was  utilized to access the internal jugular vein. Real-time ultrasound guidance was utilized for vascular access including the acquisition of a permanent ultrasound image documenting patency of the accessed vessel. The microwire was utilized to measure appropriate catheter length. A subcutaneous port pocket was then created along the upper chest wall utilizing a combination of sharp and blunt dissection. The pocket was irrigated with sterile saline. A single lumen "Slim" sized power injectable port was chosen for placement. The 8 Fr catheter was tunneled from the port pocket site to the venotomy incision. The port was placed in the pocket. The external catheter was trimmed to appropriate length. At the venotomy, an 8 Fr peel-away sheath was placed over a guidewire under fluoroscopic guidance. The catheter was then placed through the sheath and the sheath was removed. Final catheter positioning was confirmed and documented with a fluoroscopic spot radiograph. The port was accessed with a Huber needle, aspirated and flushed with heparinized saline. The venotomy site was closed with an interrupted 4-0 Vicryl suture. The port pocket incision was closed with interrupted 2-0 Vicryl suture and the skin was opposed with a running subcuticular 4-0 Vicryl suture. Dermabond and Steri-strips were applied to both incisions. Dressings were placed. The patient tolerated the procedure well without immediate post procedural complication. FINDINGS: After catheter placement, the tip lies within the superior cavoatrial junction. The   catheter aspirates and flushes normally and is ready for immediate use. IMPRESSION: Successful placement of a right internal jugular approach power injectable Port-A-Cath. The catheter is ready for immediate use. Electronically Signed   By: Sandi Mariscal M.D.   On: 03/23/2019 16:15    ASSESSMENT AND PLAN: This is a very pleasant 75 years old white male diagnosed with metastatic non-small cell lung cancer that  was initially diagnosed as unresectable stage IIb non-small cell lung cancer, adenocarcinoma presented with right upper lobe lung nodule in addition to right hilar lymphadenopathy diagnosed in February 2020.  The patient is now a good candidate for surgical resection. The patient underwent a course of concurrent chemoradiation with weekly carboplatin and paclitaxel status post 7 cycles completed in May 2020. The patient is currently on observation and he is feeling fine. He had repeat CT scan of the chest, abdomen pelvis performed recently.  I personally and independently reviewed the scan images and discussed the results with the patient today. Unfortunately his a scan showed further progression of the bilateral adrenal lesions suspicious for metastasis. This finding were confirmed by a PET scan. The patient underwent CT-guided core biopsy of the left adrenal gland lesion and the final pathology was consistent with metastatic adenocarcinoma of the lung. The patient is currently undergoing systemic chemotherapy with carboplatin for AUC of 5, Alimta 500 mg/M2 and Keytruda 200 mg IV every 3 weeks.  He is status post 2 cycles.  He continues to tolerate his treatment well except for fatigue and redness for several days after the chemotherapy. I recommended for him to proceed with cycle #3 today as planned. I will see him back for follow-up visit in 3 weeks for evaluation with repeat CT scan of the chest, abdomen pelvis for restaging of his disease. The patient was advised to call immediately if he has any concerning symptoms in the interval.  The patient voices understanding of current disease status and treatment options and is in agreement with the current care plan. All questions were answered. The patient knows to call the clinic with any problems, questions or concerns. We can certainly see the patient much sooner if necessary.  Disclaimer: This note was dictated with voice recognition software.  Similar sounding words can inadvertently be transcribed and may not be corrected upon review.

## 2019-03-31 NOTE — Telephone Encounter (Signed)
Called both pt and spouse per Dr. Carlota Raspberry. Pt is havnig low bp 97/51 with elevated heart rate at 115. The doctor is instructing the pt to not take his lisinopril and metoprolol tonight and tomorrow morning. He is also asking for an overbooking for telemed tomorrow. Pt was scheduled for telemed in the morning at 8am, Dri.Carlota Raspberry also asked if the pt had been sick on has any decreased fluid, pt says no. Pt also understands that he is not to take his bp meds tonight nor in the morning instruction from the doctor. Pt verbalized understanding.

## 2019-04-01 ENCOUNTER — Telehealth: Payer: Self-pay

## 2019-04-01 ENCOUNTER — Telehealth (INDEPENDENT_AMBULATORY_CARE_PROVIDER_SITE_OTHER): Payer: Medicare Other | Admitting: Family Medicine

## 2019-04-01 VITALS — BP 141/91 | HR 114 | Ht 73.0 in | Wt 187.0 lb

## 2019-04-01 DIAGNOSIS — M25672 Stiffness of left ankle, not elsewhere classified: Secondary | ICD-10-CM | POA: Diagnosis not present

## 2019-04-01 DIAGNOSIS — C3490 Malignant neoplasm of unspecified part of unspecified bronchus or lung: Secondary | ICD-10-CM

## 2019-04-01 DIAGNOSIS — M6281 Muscle weakness (generalized): Secondary | ICD-10-CM | POA: Diagnosis not present

## 2019-04-01 DIAGNOSIS — S82832D Other fracture of upper and lower end of left fibula, subsequent encounter for closed fracture with routine healing: Secondary | ICD-10-CM | POA: Diagnosis not present

## 2019-04-01 DIAGNOSIS — R262 Difficulty in walking, not elsewhere classified: Secondary | ICD-10-CM | POA: Diagnosis not present

## 2019-04-01 DIAGNOSIS — I10 Essential (primary) hypertension: Secondary | ICD-10-CM | POA: Diagnosis not present

## 2019-04-01 NOTE — Progress Notes (Signed)
Virtual Visit via Video Note  I connected with Seth [name] on 04/01/19 at 8:15 AM by a video enabled telemedicine application D[employer]ity initially, then reverted to audio d/t connection issues.  and verified that I am speaking with the correct person using two identifiers.   I discussed the limitations, risks, security and privacy concerns of performing an evaluation and management service by telephone and the availability of in person appointments. I also discussed with the patient that there may be a patient responsible charge related to this service. The patient expressed understanding and agreed to proceed, consent obtained  Chief complaint:  Chief Complaint  Patient presents with  . Follow-up    on last visit on 03/18/2019. and pt's BP yester day at [facility] went down to 98/51. pt currently dosen't feel any symptoms of low BP or high BP.     History of Present Illness: Seth [name] is a 74 y.o. male  Hypotension:  History of hypertension, metastatic lung cancer, seen by oncology yesterday.  Blood pressure 97/61 at oncology office visit.  He is treated with chemotherapy.  Was seen in oncology prior to his chemotherapy treatment.  Cycle #3 of carboplatin, Alimta, Keytruda.   Reportedly had held his lisinopril and metoprolol yesterday.  I was advised of this low reading, advised him to continue to hold meds until evaluation this morning.    Feels well overall.  Had chemo yesterday. Usually holds morning meds when having chemotherapy. Fatigue last night after chemo - similar to past.  Did hold lisinopril 10mg  and metoprolol 75mg  BID. Has been drinking normally. No diarrhea or other illness. Constipation from chemo. Uses laxative.  No syncope/near syncope.    Patient Active Problem List   Diagnosis Date Noted  . Adenocarcinoma of left lung, stage 4 ([place]) 02/03/2019  . Encounter for antineoplastic immunotherapy 02/03/2019  . Frequent nosebleeds 05/28/2018  . Encounter for antineoplastic  chemotherapy 04/18/2018  . Goals of care, counseling/discussion 04/18/2018  . Adenocarcinoma of left lung, stage 2 ([place]) 03/27/2018  . Abnormal PET of left lung   . S/P bronchoscopy   . Solitary lung nodule 02/20/2018  . Hilar adenopathy 02/20/2018  . Abnormal findings on diagnostic imaging of lung 02/20/2018  . Platelet inhibition due to Plavix 02/20/2018  . PVD (peripheral vascular disease) ([place]) 10/15/2013  . Tobacco use disorder 10/15/2013  . Pure hypercholesterolemia 10/15/2013  . Coronary artery disease involving native coronary artery of native heart without angina pectoris 10/15/2013  . Cataract 09/29/2013  . Colon cancer screening 09/25/2013  . AAA (abdominal aortic aneurysm) without rupture ([place]) 10/22/2012  . Nicotine addiction 10/17/2012  . Atherosclerosis of native arteries of the extremities with ulceration(440.23) 10/17/2012  . Carotid bruit 10/17/2012  . Other nonspecific abnormal cardiovascular system function study 04/10/2012  . History of sciatica   . Essential hypertension, benign   . Seasonal allergies    Past Medical History:  Diagnosis Date  . AAA (abdominal aortic aneurysm) ([place])   . Allergic rhinitis   . Anxiety   . Back pain   . CAD (coronary artery disease)   . Cataracts, bilateral   . Emphysema, unspecified ([place])     " moderate"  . Essential hypertension, benign   . GERD (gastroesophageal reflux disease)   . Gout   . Headache    migraines  . Heart murmur    as a child only  . Hilar adenopathy   . History of hiatal hernia   . History of sciatica   .  Hyperlipidemia   . lung ca dx'd 02/2018  . Lung nodule   . Pneumonia    as child  . Prostate hypertrophy   . PVD (peripheral vascular disease) ([place])   . Seasonal allergies   . Subclavian steal syndrome of left subclavian artery   . Wears dentures   . Wears dentures    Past Surgical History:  Procedure Laterality Date  . CARDIAC CATHETERIZATION     stent placement  . COLONOSCOPY W/  BIOPSIES AND POLYPECTOMY    . ILIAC ARTERY STENT    . IR IMAGING GUIDED PORT INSERTION  03/23/2019  . MULTIPLE TOOTH EXTRACTIONS    . VIDEO BRONCHOSCOPY WITH ENDOBRONCHIAL NAVIGATION N/A 02/26/2018   Procedure: VIDEO BRONCHOSCOPY WITH ENDOBRONCHIAL NAVIGATION;  Surgeon: Garner Nash, DO;  Location: [facility];  Service: Thoracic;  Laterality: N/A;  . VIDEO BRONCHOSCOPY WITH ENDOBRONCHIAL NAVIGATION N/A 03/19/2018   Procedure: VIDEO BRONCHOSCOPY WITH ENDOBRONCHIAL NAVIGATION and endobronchial ultrasound;  Surgeon: Garner Nash, DO;  Location: [facility];  Service: Thoracic;  Laterality: N/A;  . VIDEO BRONCHOSCOPY WITH ENDOBRONCHIAL ULTRASOUND N/A 02/26/2018   Procedure: VIDEO BRONCHOSCOPY WITH ENDOBRONCHIAL ULTRASOUND;  Surgeon: Garner Nash, DO;  Location: [facility];  Service: Thoracic;  Laterality: N/A;  . VIDEO BRONCHOSCOPY WITH ENDOBRONCHIAL ULTRASOUND N/A 03/19/2018   Procedure: NAVIGATION BRONCHOSCOPY;  Surgeon: Garner Nash, DO;  Location[facility];  Service: Thoracic;  Laterality: N/A;   Allergies  Allergen Reactions  . Biaxin [Clarithromycin] Shortness Of Breath  . Clarithromycin Shortness Of Breath  . Iodinated Diagnostic Agents Hives  . Prednisone Other (See Comments)    Patient states he turns REALLY RED all over.  . Cortisone Other (See Comments)    Turns red from breast up  . Flomax [Tamsulosin Hcl]     Blurred vision and lower back pain   . Hepatitis B Virus Vaccines     Nerve problems States his previous doctor told him it was due to this   . Iodine Hives    Dye from nuclear medicine test   Prior to Admission medications   Medication Sig Start Date End Date Taking? Authorizing Provider  atorvastatin (LIPITOR) 40 MG tablet Take 1 tablet (40 mg total) by mouth daily. 04/09/18 04/09/19 Yes Turner, Eber Hong, MD  cetirizine (ZYRTEC) 10 MG tablet Take 10 mg by mouth daily at 12 noon.   Yes [provider]  clopidogrel (PLAVIX) 75 MG tablet TAKE 1 TABLET BY MOUTH EVERY DAY  02/19/19  Yes [name], MD  famotidine (PEPCID) 20 MG tablet Take 20 mg by mouth 2 (two) times daily. Noon & in the evening.   Yes [provider]  folic acid (FOLVITE) 1 MG tablet Take 1 tablet (1 mg total) by mouth daily. 02/03/19  Yes Curt Bears, MD  lidocaine-prilocaine (EMLA) cream Apply to Port-A-Cath site 30 minutes before infusion. 03/10/19  Yes Curt Bears, MD  lisinopril (ZESTRIL) 10 MG tablet Take 1 tablet (10 mg total) by mouth daily. 09/18/18  Yes [name], MD  metoprolol tartrate (LOPRESSOR) 50 MG tablet Take 1.5 tablets (75 mg total) by mouth 2 (two) times daily. 03/20/19  Yes [name], MD  Olopatadine HCl 0.2 % SOLN 1 drop daily.   Yes [provider]  prochlorperazine (COMPAZINE) 10 MG tablet Take 1 tablet (10 mg total) by mouth every 6 (six) hours as needed for nausea or vomiting. 02/03/19  Yes Curt Bears, MD  sodium chloride 0.9 % SOLN 100 mL with  lidocaine-EPINEPHrine 0.5 %-1:200000 SOLN 100 mL    Yes [provider]  terbinafine (LAMISIL) 1 % cream Apply 1 application topically 2 (two) times daily.   Yes [provider]   Social History   Socioeconomic History  . Marital status: Married    Spouse name: Not on file  . Number of children: 1  . Years of education: Not on file  . Highest education level: Not on file  Occupational History  . Occupation: retired  Tobacco Use  . Smoking status: Former Smoker    Packs/day: 3.00    Years: 50.00    Pack years: 150.00    Types: Cigarettes    Quit date: 04/05/2016    Years since quitting: 2.9  . Smokeless tobacco: Never Used  Substance and Sexual Activity  . Alcohol use: Yes    Alcohol/week: 0.0 standard drinks    Comment: rare  . Drug use: No  . Sexual activity: Not on file  Other Topics Concern  . Not on file  Social History Narrative   Married   Exercise: No   Education: GED   Social Determinants of Radio broadcast assistant Strain:    . Difficulty of Paying Living Expenses: Not on file  Food Insecurity:   . Worried About Charity fundraiser in the Last Year: Not on file  . Ran Out of Food in the Last Year: Not on file  Transportation Needs:   . Lack of Transportation (Medical): Not on file  . Lack of Transportation (Non-Medical): Not on file  Physical Activity:   . Days of Exercise per Week: Not on file  . Minutes of Exercise per Session: Not on file  Stress:   . Feeling of Stress : Not on file  Social Connections:   . Frequency of Communication with Friends and Family: Not on file  . Frequency of Social Gatherings with Friends and Family: Not on file  . Attends Religious Services: Not on file  . Active Member of Clubs or Organizations: Not on file  . Attends Archivist Meetings: Not on file  . Marital Status: Not on file  Intimate Partner Violence:   . Fear of Current or Ex-Partner: Not on file  . Emotionally Abused: Not on file  . Physically Abused: Not on file  . Sexually Abused: Not on file    Observations/Objective: Vitals:   04/01/19 0808  BP: (!) 141/91  Pulse: (!) 114  Weight: 187 lb (84.8 kg)  Height: 6\' 1"  (1.854 m)   No distress, speaking normally, appropriate responses.  Nontoxic-appearing on initial video.  No respiratory distress.  Assessment and Plan: Essential hypertension  Malignant neoplasm of lung, unspecified laterality, unspecified part of lung ([place])  -Asymptomatic but borderline low BP yesterday.  Unknown cause, denies dehydration or recent illness.  Slight elevation today off meds.  Restart metoprolol at lower dose of 50 mg twice daily if persistent elevated blood pressures today.  Monitor blood pressure frequently today, next few days.  Hold lisinopril for now.  Recheck 5 days with telemedicine visit, ER/urgent care precautions if any worsening sooner.  Follow Up Instructions: 5 days, virtual visit   I discussed the assessment and treatment plan with the patient. The  patient was provided an opportunity to ask questions and all were answered. The patient agreed with the plan and demonstrated an understanding of the instructions.   The patient was advised to call back or seek an in-person evaluation if the symptoms worsen or  if the condition fails to improve as anticipated.  I provided 14 minutes of non-face-to-face time during this encounter.   [name], MD

## 2019-04-01 NOTE — Telephone Encounter (Signed)
Advised patient that it is okay for him to get his COVID vaccine scheduled for Monday 03/01/2.

## 2019-04-01 NOTE — Patient Instructions (Signed)
If BP remains over 140/90 today - then take just 1 metoprolol tonight, then again 1 in the morning if blood pressure is stable. If any readings under 110 on top number - do not take metoprolol.   Do not take lisinopril for now.  Recheck with me with telemed visit in 5 days.   Return to the clinic or go to the nearest emergency room if any of your symptoms worsen or new symptoms occur.

## 2019-04-02 ENCOUNTER — Ambulatory Visit (INDEPENDENT_AMBULATORY_CARE_PROVIDER_SITE_OTHER): Payer: Medicare Other | Admitting: Physician Assistant

## 2019-04-02 ENCOUNTER — Encounter: Payer: Self-pay | Admitting: Orthopedic Surgery

## 2019-04-02 ENCOUNTER — Other Ambulatory Visit: Payer: Self-pay

## 2019-04-02 VITALS — Ht 73.0 in | Wt 187.0 lb

## 2019-04-02 DIAGNOSIS — M7989 Other specified soft tissue disorders: Secondary | ICD-10-CM | POA: Diagnosis not present

## 2019-04-02 NOTE — Progress Notes (Signed)
Office Visit Note   Patient: Seth [name]           Date of Birth: 1945/08/17           MRN: [id number] Visit Date: 04/02/2019              Requested by: [name], MD [address] [place],  [place] [address] PCP: [name], MD  Chief Complaint  Patient presents with  . Left Leg - Follow-up      HPI: This is a pleasant gentleman who follows up on his left lower extremity wound.  He also underwent ABIs.  He does have a history of vascular stents bilaterally.  He is working with physical therapy as he says because he was immobilized for so long he has become quite weak.  He denies any claudication or painful symptoms when he is weightbearing  Assessment & Plan: Visit Diagnoses: No diagnosis found.  Plan: ABIs were reviewed and monophasic pulses this is stable.  He also given his presentation and progression I discussed with him to follow-up if he gets any skin breakdown or if he begins to get claudication issues we will refer him to vascular surgery.  He would like to follow-up as needed as he has many doctors appointments right now because of his ongoing treatment for his lung cancer  Follow-Up Instructions: No follow-ups on file.   Ortho Exam  Patient is alert, oriented, no adenopathy, well-dressed, normal affect, normal respiratory effort. Left lower extremity there are some discoloration but no significant swelling in the lower extremity no open ulcers at this time no surrounding cellulitis nontender to palpation  Imaging: No results found. No images are attached to the encounter.  Labs: Lab Results  Component Value Date   HGBA1C 6.2 (H) 04/23/2017   HGBA1C 6.1 (H) 10/22/2016   HGBA1C 5.9 (H) 04/12/2016   LABURIC 6.9 04/28/2018     Lab Results  Component Value Date   ALBUMIN 3.5 03/31/2019   ALBUMIN 3.7 03/24/2019   ALBUMIN 3.8 03/17/2019   LABURIC 6.9 04/28/2018    No results found for: MG No results found for: VD25OH  No results found  for: PREALBUMIN CBC EXTENDED Latest Ref Rng & Units 03/31/2019 03/24/2019 03/23/2019  WBC 4.0 - 10.5 K/uL 5.4 2.5(L) 1.7(L)  RBC 4.22 - 5.81 MIL/uL 3.74(L) 3.82(L) 3.81(L)  HGB 13.0 - 17.0 g/dL 11.1(L) 11.4(L) 11.3(L)  HCT 39.0 - 52.0 % 33.2(L) 33.7(L) 33.8(L)  PLT 150 - 400 K/uL 220 84(L) 75(L)  NEUTROABS 1.7 - 7.7 K/uL 3.9 0.9(L) -  LYMPHSABS 0.7 - 4.0 K/uL 0.8 0.7 -     Body mass index is 24.67 kg/m.  Orders:  No orders of the defined types were placed in this encounter.  No orders of the defined types were placed in this encounter.    Procedures: No procedures performed  Clinical Data: No additional findings.  ROS:  All other systems negative, except as noted in the HPI. Review of Systems  Objective: Vital Signs: Ht 6\' 1"  (1.854 m)   Wt 187 lb (84.8 kg)   BMI 24.67 kg/m   Specialty Comments:  No specialty comments available.  PMFS History: Patient Active Problem List   Diagnosis Date Noted  . Adenocarcinoma of left lung, stage 4 ([place]) 02/03/2019  . Encounter for antineoplastic immunotherapy 02/03/2019  . Frequent nosebleeds 05/28/2018  . Encounter for antineoplastic chemotherapy 04/18/2018  . Goals of care, counseling/discussion 04/18/2018  . Adenocarcinoma of left lung, stage 2 (  [place]) 03/27/2018  . Abnormal PET of left lung   . S/P bronchoscopy   . Solitary lung nodule 02/20/2018  . Hilar adenopathy 02/20/2018  . Abnormal findings on diagnostic imaging of lung 02/20/2018  . Platelet inhibition due to Plavix 02/20/2018  . PVD (peripheral vascular disease) ([place]) 10/15/2013  . Tobacco use disorder 10/15/2013  . Pure hypercholesterolemia 10/15/2013  . Coronary artery disease involving native coronary artery of native heart without angina pectoris 10/15/2013  . Cataract 09/29/2013  . Colon cancer screening 09/25/2013  . AAA (abdominal aortic aneurysm) without rupture ([place]) 10/22/2012  . Nicotine addiction 10/17/2012  . Atherosclerosis of native arteries of  the extremities with ulceration(440.23) 10/17/2012  . Carotid bruit 10/17/2012  . Other nonspecific abnormal cardiovascular system function study 04/10/2012  . History of sciatica   . Essential hypertension, benign   . Seasonal allergies    Past Medical History:  Diagnosis Date  . AAA (abdominal aortic aneurysm) ([place])   . Allergic rhinitis   . Anxiety   . Back pain   . CAD (coronary artery disease)   . Cataracts, bilateral   . Emphysema, unspecified ([place])     " moderate"  . Essential hypertension, benign   . GERD (gastroesophageal reflux disease)   . Gout   . Headache    migraines  . Heart murmur    as a child only  . Hilar adenopathy   . History of hiatal hernia   . History of sciatica   . Hyperlipidemia   . lung ca dx'd 02/2018  . Lung nodule   . Pneumonia    as child  . Prostate hypertrophy   . PVD (peripheral vascular disease) ([place])   . Seasonal allergies   . Subclavian steal syndrome of left subclavian artery   . Wears dentures   . Wears dentures     Family History  Problem Relation Age of Onset  . Hypertension Mother   . Heart disease Mother   . Stroke Mother   . Heart disease Paternal Grandmother   . Diabetes Paternal Grandmother   . Hyperlipidemia Paternal Grandmother   . Diabetes Sister   . Heart disease Sister   . Hyperlipidemia Sister   . Heart disease Brother   . Hyperlipidemia Brother   . Hypertension Brother   . Diabetes Paternal Grandfather   . Heart disease Paternal Grandfather   . Hyperlipidemia Paternal Grandfather   . Heart disease Maternal Uncle        x 2  . Cancer Maternal Grandmother        type unknown, ? lung  . Leukemia Maternal Uncle     Past Surgical History:  Procedure Laterality Date  . CARDIAC CATHETERIZATION     stent placement  . COLONOSCOPY W/ BIOPSIES AND POLYPECTOMY    . ILIAC ARTERY STENT    . IR IMAGING GUIDED PORT INSERTION  03/23/2019  . MULTIPLE TOOTH EXTRACTIONS    . VIDEO BRONCHOSCOPY WITH ENDOBRONCHIAL  NAVIGATION N/A 02/26/2018   Procedure: VIDEO BRONCHOSCOPY WITH ENDOBRONCHIAL NAVIGATION;  Surgeon: Garner Nash, DO;  Location: [facility];  Service: Thoracic;  Laterality: N/A;  . VIDEO BRONCHOSCOPY WITH ENDOBRONCHIAL NAVIGATION N/A 03/19/2018   Procedure: VIDEO BRONCHOSCOPY WITH ENDOBRONCHIAL NAVIGATION and endobronchial ultrasound;  Surgeon: Garner Nash, DO;  Location: [facility];  Service: Thoracic;  Laterality: N/A;  . VIDEO BRONCHOSCOPY WITH ENDOBRONCHIAL ULTRASOUND N/A 02/26/2018   Procedure: VIDEO BRONCHOSCOPY WITH ENDOBRONCHIAL ULTRASOUND;  Surgeon: Garner Nash, DO;  Location: [facility];  Service: Thoracic;  Laterality: N/A;  . VIDEO BRONCHOSCOPY WITH ENDOBRONCHIAL ULTRASOUND N/A 03/19/2018   Procedure: NAVIGATION BRONCHOSCOPY;  Surgeon: Garner Nash, DO;  Location: [facility];  Service: Thoracic;  Laterality: N/A;   Social History   Occupational History  . Occupation: retired  Tobacco Use  . Smoking status: Former Smoker    Packs/day: 3.00    Years: 50.00    Pack years: 150.00    Types: Cigarettes    Quit date: 04/05/2016    Years since quitting: 2.9  . Smokeless tobacco: Never Used  Substance and Sexual Activity  . Alcohol use: Yes    Alcohol/week: 0.0 standard drinks    Comment: rare  . Drug use: No  . Sexual activity: Not on file

## 2019-04-03 ENCOUNTER — Telehealth: Payer: Self-pay | Admitting: Medical Oncology

## 2019-04-03 DIAGNOSIS — M25672 Stiffness of left ankle, not elsewhere classified: Secondary | ICD-10-CM | POA: Diagnosis not present

## 2019-04-03 DIAGNOSIS — K121 Other forms of stomatitis: Secondary | ICD-10-CM

## 2019-04-03 DIAGNOSIS — R262 Difficulty in walking, not elsewhere classified: Secondary | ICD-10-CM | POA: Diagnosis not present

## 2019-04-03 DIAGNOSIS — S82832D Other fracture of upper and lower end of left fibula, subsequent encounter for closed fracture with routine healing: Secondary | ICD-10-CM | POA: Diagnosis not present

## 2019-04-03 DIAGNOSIS — M6281 Muscle weakness (generalized): Secondary | ICD-10-CM | POA: Diagnosis not present

## 2019-04-03 MED ORDER — MAGIC MOUTHWASH: 5.0000 mL | Freq: Four times a day (QID) | ORAL | 0 refills | Status: DC | PRN

## 2019-04-03 MED FILL — MAGIC MOUTHWASH: 12 days supply | Qty: 240 | Fill #0

## 2019-04-03 NOTE — Telephone Encounter (Signed)
MMW cannot be compounded by [facility] . Called to [facility] outpt .

## 2019-04-03 NOTE — Telephone Encounter (Signed)
I spoke to pt and he needs to go to [facility] now because they will not mix it up until pt is present.

## 2019-04-03 NOTE — Telephone Encounter (Signed)
Persistent Sore throat and "tongue is raw" started wed and continues . It is usually gone by now.  Per Cassie I called in Magic mouthwash.

## 2019-04-06 ENCOUNTER — Other Ambulatory Visit: Payer: Self-pay

## 2019-04-06 ENCOUNTER — Encounter: Payer: Self-pay | Admitting: Cardiology

## 2019-04-06 ENCOUNTER — Telehealth (INDEPENDENT_AMBULATORY_CARE_PROVIDER_SITE_OTHER): Payer: Medicare Other | Admitting: Family Medicine

## 2019-04-06 ENCOUNTER — Ambulatory Visit (INDEPENDENT_AMBULATORY_CARE_PROVIDER_SITE_OTHER): Payer: Medicare Other | Admitting: Cardiology

## 2019-04-06 ENCOUNTER — Ambulatory Visit: Payer: Medicare Other | Attending: Internal Medicine

## 2019-04-06 VITALS — BP 122/83 | HR 107 | Ht 73.0 in

## 2019-04-06 VITALS — BP 110/78 | HR 85 | Ht 73.0 in | Wt 182.2 lb

## 2019-04-06 DIAGNOSIS — E7800 Pure hypercholesterolemia, unspecified: Secondary | ICD-10-CM

## 2019-04-06 DIAGNOSIS — Z09 Encounter for follow-up examination after completed treatment for conditions other than malignant neoplasm: Secondary | ICD-10-CM

## 2019-04-06 DIAGNOSIS — I2510 Atherosclerotic heart disease of native coronary artery without angina pectoris: Secondary | ICD-10-CM | POA: Diagnosis not present

## 2019-04-06 DIAGNOSIS — I739 Peripheral vascular disease, unspecified: Secondary | ICD-10-CM

## 2019-04-06 DIAGNOSIS — I10 Essential (primary) hypertension: Secondary | ICD-10-CM

## 2019-04-06 DIAGNOSIS — Z23 Encounter for immunization: Secondary | ICD-10-CM | POA: Insufficient documentation

## 2019-04-06 MED ORDER — METOPROLOL TARTRATE 25 MG PO TABS: 25.0000 mg | ORAL_TABLET | Freq: Two times a day (BID) | ORAL | 3 refills | Status: AC

## 2019-04-06 NOTE — Patient Instructions (Signed)
Medication Instructions:  Your physician has recommended you make the following change in your medication:  1) DECREASE Lopressor to 25 mg twice per day. Only take if systolic blood pressure is >110.   *If you need a refill on your cardiac medications before your next appointment, please call your pharmacy*   Testing/Procedures: Your physician has requested that you have an echocardiogram. Echocardiography is a painless test that uses sound waves to create images of your heart. It provides your doctor with information about the size and shape of your heart and how well your heart's chambers and valves are working. This procedure takes approximately one hour. There are no restrictions for this procedure.  Follow-Up: At [facility], you and your health needs are our priority.  As part of our continuing mission to provide you with exceptional heart care, we have created designated Provider Care Teams.  These Care Teams include your primary Cardiologist (physician) and Advanced Practice Providers (APPs -  Physician Assistants and Nurse Practitioners) who all work together to provide you with the care you need, when you need it.  Your next appointment:   6 month(s)  The format for your next appointment:   In Person  Provider:   Fransico Him, MD

## 2019-04-06 NOTE — Progress Notes (Signed)
Cardiology Office Note:    Date:  04/06/2019   ID:  Seth [name], DOB 1945/02/15, MRN [id number]  PCP:  Seth Margarita, MD  Cardiologist:  No primary care provider on file.    Referring MD: Seth Agreste, MD   Chief Complaint  Patient presents with  . Coronary Artery Disease  . Hypertension  . Hyperlipidemia    History of Present Illness:    Seth [name] is a 74 y.o. male with a hx of nonobstructive CAD by cath after stress test showed transient ischemic dilation and has been on medical therapy, tobacco abuse, hypertension, hyperlipidemia, PVD with abdominal aortic aneurysm and bilateral iliac stents followed by Dr. Maryjean [name] in [facility].   He last saw me for  preoperative cardiac clearance prior to undergoing lung resection a year ago for a pulmonary nodule.  He has a long history of tobacco use smoking 2 packs/day for 30 years and quit 2 years ago.  He  had hemoptysis since Thanksgiving 2019 and was found to have a 2.1 x 2.1 cm mass in the sacral lingula of the left upper lobe suspicious for malignancy and cytology consistent with stage IIb non-small cell lung CA and initially unresectable along with right hilar adenopathy.  The patient underwent a course of concurrent chemoradiation with weekly carboplatin and paclitaxel status post 7 cycles completed in May 2020.  Repeat CT scan showed further progression of the bilateral adrenal lesions suspicious for metastasis. This finding were confirmed by a PET scan.The patient underwent CT-guided core biopsy of the left adrenal gland lesion and the final pathology was consistent with metastatic adenocarcinoma of the lung. The patient is currently undergoing systemic chemotherapy with carboplatin for AUC of 5, Alimta 500 mg/M2 and Keytruda 200 mg IV every 3 weeks.  He is status post 1 cycle.  He tolerated the first cycle of his treatment well except for fatigue.  He is here today for followup and is doing well.  He denies any chest pain or pressure,   PND, orthopnea, LE edema, palpitations or syncope. He has some mild DOE but only with extreme exertion.  He has had some problems with dizziness when BP runs low and his Lisinopril was stopped.  He only takes his lopressor if his SBP>174mmHg.  He is compliant with his meds and is tolerating meds with no SE.     Past Medical History:  Diagnosis Date  . AAA (abdominal aortic aneurysm) ([place])   . Allergic rhinitis   . Anxiety   . Back pain   . CAD (coronary artery disease)   . Cataracts, bilateral   . Emphysema, unspecified ([place])     " moderate"  . Essential hypertension, benign   . GERD (gastroesophageal reflux disease)   . Gout   . Headache    migraines  . Hilar adenopathy   . History of hiatal hernia   . History of sciatica   . Hyperlipidemia   . lung ca dx'd 02/2018  . Lung nodule   . Pneumonia    as child  . Prostate hypertrophy   . PVD (peripheral vascular disease) ([place])   . Seasonal allergies   . Subclavian steal syndrome of left subclavian artery   . Wears dentures     Past Surgical History:  Procedure Laterality Date  . CARDIAC CATHETERIZATION     stent placement  . COLONOSCOPY W/ BIOPSIES AND POLYPECTOMY    . ILIAC ARTERY STENT    . IR IMAGING GUIDED PORT  INSERTION  03/23/2019  . MULTIPLE TOOTH EXTRACTIONS    . VIDEO BRONCHOSCOPY WITH ENDOBRONCHIAL NAVIGATION N/A 02/26/2018   Procedure: VIDEO BRONCHOSCOPY WITH ENDOBRONCHIAL NAVIGATION;  Surgeon: Seth Nash, DO;  Location: [place];  Service: Thoracic;  Laterality: N/A;  . VIDEO BRONCHOSCOPY WITH ENDOBRONCHIAL NAVIGATION N/A 03/19/2018   Procedure: VIDEO BRONCHOSCOPY WITH ENDOBRONCHIAL NAVIGATION and endobronchial ultrasound;  Surgeon: Seth Nash, DO;  Location: [place];  Service: Thoracic;  Laterality: N/A;  . VIDEO BRONCHOSCOPY WITH ENDOBRONCHIAL ULTRASOUND N/A 02/26/2018   Procedure: VIDEO BRONCHOSCOPY WITH ENDOBRONCHIAL ULTRASOUND;  Surgeon: Seth Nash, DO;  Location: [facility];  Service: Thoracic;   Laterality: N/A;  . VIDEO BRONCHOSCOPY WITH ENDOBRONCHIAL ULTRASOUND N/A 03/19/2018   Procedure: NAVIGATION BRONCHOSCOPY;  Surgeon: Seth Nash, DO;  [facility];  Service: Thoracic;  Laterality: N/A;    Current Medications: Current Meds  Medication Sig  . atorvastatin (LIPITOR) 40 MG tablet Take 1 tablet (40 mg total) by mouth daily.  . cetirizine (ZYRTEC) 10 MG tablet Take 10 mg by mouth daily at 12 noon.  . clopidogrel (PLAVIX) 75 MG tablet TAKE 1 TABLET BY MOUTH EVERY DAY  . famotidine (PEPCID) 20 MG tablet Take 20 mg by mouth 2 (two) times daily. Noon & in the evening.  . folic acid (FOLVITE) 1 MG tablet Take 1 tablet (1 mg total) by mouth daily.  Marland Kitchen lidocaine-prilocaine (EMLA) cream Apply to Port-A-Cath site 30 minutes before infusion.  Marland Kitchen lisinopril (ZESTRIL) 10 MG tablet Take 1 tablet (10 mg total) by mouth daily.  . magic mouthwash SOLN Take 5 mLs by mouth 4 (four) times daily as needed for mouth pain.  Marland Kitchen Olopatadine HCl 0.2 % SOLN 1 drop daily.  . prochlorperazine (COMPAZINE) 10 MG tablet Take 1 tablet (10 mg total) by mouth every 6 (six) hours as needed for nausea or vomiting.  . sodium chloride 0.9 % SOLN 100 mL with lidocaine-EPINEPHrine 0.5 %-1:200000 SOLN 100 mL   . terbinafine (LAMISIL) 1 % cream Apply 1 application topically 2 (two) times daily.  . [DISCONTINUED] metoprolol tartrate (LOPRESSOR) 50 MG tablet Take 1.5 tablets (75 mg total) by mouth 2 (two) times daily.   Current Facility-Administered Medications for the 04/06/19 encounter (Office Visit) with Seth Margarita, MD  Medication  . 0.9 %  sodium chloride infusion     Allergies:   Biaxin [clarithromycin], Clarithromycin, Iodinated diagnostic agents, Prednisone, Cortisone, Flomax [tamsulosin hcl], Hepatitis b virus vaccines, and Iodine   Social History   Socioeconomic History  . Marital status: Married    Spouse name: Not on file  . Number of children: 1  . Years of education: Not on file  . Highest  education level: Not on file  Occupational History  . Occupation: retired  Tobacco Use  . Smoking status: Former Smoker    Packs/day: 3.00    Years: 50.00    Pack years: 150.00    Types: Cigarettes    Quit date: 04/05/2016    Years since quitting: 3.0  . Smokeless tobacco: Never Used  Substance and Sexual Activity  . Alcohol use: Yes    Alcohol/week: 0.0 standard drinks    Comment: rare  . Drug use: No  . Sexual activity: Not on file  Other Topics Concern  . Not on file  Social History Narrative   Married   Exercise: No   Education: GED   Social Determinants of Radio broadcast assistant Strain:   . Difficulty of Paying Living Expenses:  Not on file  Food Insecurity:   . Worried About Charity fundraiser in the Last Year: Not on file  . Ran Out of Food in the Last Year: Not on file  Transportation Needs:   . Lack of Transportation (Medical): Not on file  . Lack of Transportation (Non-Medical): Not on file  Physical Activity:   . Days of Exercise per Week: Not on file  . Minutes of Exercise per Session: Not on file  Stress:   . Feeling of Stress : Not on file  Social Connections:   . Frequency of Communication with Friends and Family: Not on file  . Frequency of Social Gatherings with Friends and Family: Not on file  . Attends Religious Services: Not on file  . Active Member of Clubs or Organizations: Not on file  . Attends Archivist Meetings: Not on file  . Marital Status: Not on file     Family History: The patient's family history includes Cancer in his maternal grandmother; Diabetes in his paternal grandfather, paternal grandmother, and sister; Heart disease in his brother, maternal uncle, mother, paternal grandfather, paternal grandmother, and sister; Hyperlipidemia in his brother, paternal grandfather, paternal grandmother, and sister; Hypertension in his brother and mother; Leukemia in his maternal uncle; Stroke in his mother.  ROS:   Please see the  history of present illness.    ROS  All other systems reviewed and negative.   EKGs/Labs/Other Studies Reviewed:    The following studies were reviewed today: none  EKG:  EKG is  ordered today.  The ekg ordered today demonstrates NSR  Recent Labs: 03/31/2019: ALT 12; BUN 12; Creatinine 1.04; Hemoglobin 11.1; Platelet Count 220; Potassium 3.7; Sodium 136; TSH 2.230   Recent Lipid Panel    Component Value Date/Time   CHOL 132 08/18/2018 0903   TRIG 152 (H) 08/18/2018 0903   HDL 34 (L) 08/18/2018 0903   CHOLHDL 3.9 08/18/2018 0903   CHOLHDL 3.9 03/23/2015 0928   VLDL 20 03/23/2015 0928   LDLCALC 68 08/18/2018 0903    Physical Exam:    VS:  BP 110/78   Pulse 85   Ht 6\' 1"  (1.854 m)   Wt 182 lb 3.2 oz (82.6 kg)   SpO2 100%   BMI 24.04 kg/m     Wt Readings from Last 3 Encounters:  04/06/19 182 lb 3.2 oz (82.6 kg)  04/02/19 187 lb (84.8 kg)  04/01/19 187 lb (84.8 kg)     GEN:  Well nourished, well developed in no acute distress HEENT: Normal NECK: No JVD; No carotid bruits LYMPHATICS: No lymphadenopathy CARDIAC: RRR, no murmurs, rubs, gallops RESPIRATORY:  Clear to auscultation without rales, wheezing or rhonchi  ABDOMEN: Soft, non-tender, non-distended MUSCULOSKELETAL:  No edema; No deformity  SKIN: Warm and dry NEUROLOGIC:  Alert and oriented x 3 PSYCHIATRIC:  Normal affect   ASSESSMENT:    1. Coronary artery disease involving native coronary artery of native heart without angina pectoris   2. PVD (peripheral vascular disease) ([place])   3. Essential hypertension, benign   4. Pure hypercholesterolemia   5. Chemotherapy follow-up examination    PLAN:    In order of problems listed above:  1.  ASCAD -cardiac cath in 2014 showed nonobstructive CAD with 40% stenosis in the mid LAD after the takeoff of the first diagonal, patent left circumflex with OM x2, patent RCA with 30 to 40% diffuse irregularities in the proximal portion. -denies any anginal sx -nuclear  stress test 04/2018  showed no ischemia -2D echo showed normal LVF 04/2018 -continue Plavix 75mg  daily, BB and statin  2.  PVD -s/p bilateral iliac stenting and has a known abdominal aortic aneurysm at 4 cm followed by vascular surgery. -denies any claudication -continue Plavix  3.  HTN -BP controlled and has actually been on the soft side -decrease Lopressor to 25mg  BID and only take if SBP>155mmHg -Creatinine was 1.17 and K+ 4.6 in Dec 2020  4.  HLD -LDL goal < 70 -LDL 108 in 01/2019 - it was 68 in July -will get the full lipid panel from his PCP -continue Atorvastatin 20mg  daily  5.  Metastatic lung CA -getting Chemo -mets to adrenals and possibly reoccurrence in lungs -will repeat echo to assess LVF since he is on immunotherapy   Medication Adjustments/Labs and Tests Ordered: Current medicines are reviewed at length with the patient today.  Concerns regarding medicines are outlined above.  Orders Placed This Encounter  Procedures  . EKG 12-Lead  . ECHOCARDIOGRAM COMPLETE   Meds ordered this encounter  Medications  . metoprolol tartrate (LOPRESSOR) 25 MG tablet    Sig: Take 1 tablet (25 mg total) by mouth 2 (two) times daily. Only take if systolic blood pressure is >110    Dispense:  180 tablet    Refill:  3    Signed, Fransico Him, MD  04/06/2019 5:38 PM    [facility]

## 2019-04-06 NOTE — Progress Notes (Signed)
Virtual Visit via audio Note  I connected with Seth [name] on 04/06/19 at 5:42 PM by phone- he was unable to connect to video,  and verified that I am speaking with the correct person using two identifiers.   I discussed the limitations, risks, security and privacy concerns of performing an evaluation and management service by telephone and the availability of in person appointments. I also discussed with the patient that there may be a patient responsible charge related to this service. The patient expressed understanding and agreed to proceed, consent obtained  Chief complaint:  Chief Complaint  Patient presents with  . Follow-up    On hypertension. pt had a miss understanding on how to take his medication. pt has felt light headed, dizzy, and faint. pt also states having head aches.     History of Present Illness: Seth [name] is a 74 y.o. male  Hypertension: Follow-up from telemedicine visit 5 days ago.  Low blood pressures noted at chemotherapy visit, oncology visit on February 23.  He did hold his lisinopril and metoprolol day of chemotherapy.  Has been feeling well overall when I talked to him on the 25th.  Asymptomatic at that time.  Slight elevated blood pressure of 141/91 off medications that day.  We restarted his metoprolol at a lower dose of 50 mg twice per day -previously had taken 75 mg twice per day. held lisinopril temporarily.  Home monitoring discussed.   Cardiology visit today noted, blood pressure 110/78.  He was only taking his Lopressor if his systolic blood pressure was greater than 110.  Lopressor was decreased to 25 mg twice daily only if systolic pressure above 518. Continued on plavix, statin for ASCAD.  Planned routine follow up with vascular surgery for PVD, and abdominal aortic aneurysm.   Home BP variable - highest 160/89 this am at 9am. Low of 109/71 on 2/28. BP 119/101 HR 146 on 2/26.  Took metoprolol 50mg  this am. Not feeling well at times. Last round of  chemo was tough. Has lost some weight, difficult to eat.  Drinking fluids, water. Urinating ok. Feels lightheaded at times - off and on since chemo. Not checking BP when feels this way. Prior HA resolved currently. sleeping most of the day.    BP Readings from Last 3 Encounters:  04/06/19 122/83  04/06/19 110/78  04/01/19 (!) 141/91   Lab Results  Component Value Date   CREATININE 1.04 03/31/2019     Patient Active Problem List   Diagnosis Date Noted  . Adenocarcinoma of left lung, stage 4 ([place]) 02/03/2019  . Encounter for antineoplastic immunotherapy 02/03/2019  . Frequent nosebleeds 05/28/2018  . Encounter for antineoplastic chemotherapy 04/18/2018  . Goals of care, counseling/discussion 04/18/2018  . Adenocarcinoma of left lung, stage 2 ([place]) 03/27/2018  . Abnormal PET of left lung   . S/P bronchoscopy   . Solitary lung nodule 02/20/2018  . Hilar adenopathy 02/20/2018  . Abnormal findings on diagnostic imaging of lung 02/20/2018  . Platelet inhibition due to Plavix 02/20/2018  . PVD (peripheral vascular disease) ([place]) 10/15/2013  . Tobacco use disorder 10/15/2013  . Pure hypercholesterolemia 10/15/2013  . Coronary artery disease involving native coronary artery of native heart without angina pectoris 10/15/2013  . Cataract 09/29/2013  . Colon cancer screening 09/25/2013  . AAA (abdominal aortic aneurysm) without rupture ([place]) 10/22/2012  . Nicotine addiction 10/17/2012  . Atherosclerosis of native arteries of the extremities with ulceration(440.23) 10/17/2012  . Carotid bruit 10/17/2012  . Other  nonspecific abnormal cardiovascular system function study 04/10/2012  . History of sciatica   . Essential hypertension, benign   . Seasonal allergies    Past Medical History:  Diagnosis Date  . AAA (abdominal aortic aneurysm) ([place])   . Allergic rhinitis   . Anxiety   . Back pain   . CAD (coronary artery disease)   . Cataracts, bilateral   . Emphysema, unspecified ([place])      " moderate"  . Essential hypertension, benign   . GERD (gastroesophageal reflux disease)   . Gout   . Headache    migraines  . Hilar adenopathy   . History of hiatal hernia   . History of sciatica   . Hyperlipidemia   . lung ca dx'd 02/2018  . Lung nodule   . Pneumonia    as child  . Prostate hypertrophy   . PVD (peripheral vascular disease) ([place])   . Seasonal allergies   . Subclavian steal syndrome of left subclavian artery   . Wears dentures    Past Surgical History:  Procedure Laterality Date  . CARDIAC CATHETERIZATION     stent placement  . COLONOSCOPY W/ BIOPSIES AND POLYPECTOMY    . ILIAC ARTERY STENT    . IR IMAGING GUIDED PORT INSERTION  03/23/2019  . MULTIPLE TOOTH EXTRACTIONS    . VIDEO BRONCHOSCOPY WITH ENDOBRONCHIAL NAVIGATION N/A 02/26/2018   Procedure: VIDEO BRONCHOSCOPY WITH ENDOBRONCHIAL NAVIGATION;  Surgeon: Garner Nash, DO;  Location: [facility];  Service: Thoracic;  Laterality: N/A;  . VIDEO BRONCHOSCOPY WITH ENDOBRONCHIAL NAVIGATION N/A 03/19/2018   Procedure: VIDEO BRONCHOSCOPY WITH ENDOBRONCHIAL NAVIGATION and endobronchial ultrasound;  Surgeon: Garner Nash, DO;  Location: [facility];  Service: Thoracic;  Laterality: N/A;  . VIDEO BRONCHOSCOPY WITH ENDOBRONCHIAL ULTRASOUND N/A 02/26/2018   Procedure: VIDEO BRONCHOSCOPY WITH ENDOBRONCHIAL ULTRASOUND;  Surgeon: Garner Nash, DO;  Location: [facility];  Service: Thoracic;  Laterality: N/A;  . VIDEO BRONCHOSCOPY WITH ENDOBRONCHIAL ULTRASOUND N/A 03/19/2018   Procedure: NAVIGATION BRONCHOSCOPY;  Surgeon: Garner Nash, DO;  [facility];  Service: Thoracic;  Laterality: N/A;   Allergies  Allergen Reactions  . Biaxin [Clarithromycin] Shortness Of Breath  . Clarithromycin Shortness Of Breath  . Iodinated Diagnostic Agents Hives  . Prednisone Other (See Comments)    Patient states he turns REALLY RED all over.  . Cortisone Other (See Comments)    Turns red from breast up  . Flomax [Tamsulosin Hcl]      Blurred vision and lower back pain   . Hepatitis B Virus Vaccines     Nerve problems States his previous doctor told him it was due to this   . Iodine Hives    Dye from nuclear medicine test   Prior to Admission medications   Medication Sig Start Date End Date Taking? Authorizing Provider  atorvastatin (LIPITOR) 40 MG tablet Take 1 tablet (40 mg total) by mouth daily. 04/09/18 04/09/19 Yes Turner, Eber Hong, MD  cetirizine (ZYRTEC) 10 MG tablet Take 10 mg by mouth daily at 12 noon.   Yes [provider]  clopidogrel (PLAVIX) 75 MG tablet TAKE 1 TABLET BY MOUTH EVERY DAY 02/19/19  Yes [name], MD  famotidine (PEPCID) 20 MG tablet Take 20 mg by mouth 2 (two) times daily. Noon & in the evening.   Yes [provider]  folic acid (FOLVITE) 1 MG tablet Take 1 tablet (1 mg total) by mouth daily. 02/03/19  Yes Curt Bears, MD  lidocaine-prilocaine (EMLA) cream  Apply to Port-A-Cath site 30 minutes before infusion. 03/10/19  Yes Curt Bears, MD  lisinopril (ZESTRIL) 10 MG tablet Take 1 tablet (10 mg total) by mouth daily. 09/18/18  Yes [name], MD  magic mouthwash SOLN Take 5 mLs by mouth 4 (four) times daily as needed for mouth pain. 04/03/19  Yes Heilingoetter, Cassandra L, PA-C  Olopatadine HCl 0.2 % SOLN 1 drop daily.   Yes [provider]  prochlorperazine (COMPAZINE) 10 MG tablet Take 1 tablet (10 mg total) by mouth every 6 (six) hours as needed for nausea or vomiting. 02/03/19  Yes Curt Bears, MD  sodium chloride 0.9 % SOLN 100 mL with lidocaine-EPINEPHrine 0.5 %-1:200000 SOLN 100 mL    Yes [provider]  terbinafine (LAMISIL) 1 % cream Apply 1 application topically 2 (two) times daily.   Yes [provider]  metoprolol tartrate (LOPRESSOR) 25 MG tablet Take 1 tablet (25 mg total) by mouth 2 (two) times daily. Only take if systolic blood pressure is >110 04/06/19   Sueanne Margarita, MD   Social History   Socioeconomic History   . Marital status: Married    Spouse name: Not on file  . Number of children: 1  . Years of education: Not on file  . Highest education level: Not on file  Occupational History  . Occupation: retired  Tobacco Use  . Smoking status: Former Smoker    Packs/day: 3.00    Years: 50.00    Pack years: 150.00    Types: Cigarettes    Quit date: 04/05/2016    Years since quitting: 3.0  . Smokeless tobacco: Never Used  Substance and Sexual Activity  . Alcohol use: Yes    Alcohol/week: 0.0 standard drinks    Comment: rare  . Drug use: No  . Sexual activity: Not on file  Other Topics Concern  . Not on file  Social History Narrative   Married   Exercise: No   Education: GED   Social Determinants of Radio broadcast assistant Strain:   . Difficulty of Paying Living Expenses: Not on file  Food Insecurity:   . Worried About Charity fundraiser in the Last Year: Not on file  . Ran Out of Food in the Last Year: Not on file  Transportation Needs:   . Lack of Transportation (Medical): Not on file  . Lack of Transportation (Non-Medical): Not on file  Physical Activity:   . Days of Exercise per Week: Not on file  . Minutes of Exercise per Session: Not on file  Stress:   . Feeling of Stress : Not on file  Social Connections:   . Frequency of Communication with Friends and Family: Not on file  . Frequency of Social Gatherings with Friends and Family: Not on file  . Attends Religious Services: Not on file  . Active Member of Clubs or Organizations: Not on file  . Attends Archivist Meetings: Not on file  . Marital Status: Not on file  Intimate Partner Violence:   . Fear of Current or Ex-Partner: Not on file  . Emotionally Abused: Not on file  . Physically Abused: Not on file  . Sexually Abused: Not on file    Observations/Objective: Vitals:   04/06/19 1337  BP: 122/83  Pulse: (!) 107  Height: 6\' 1"  (1.854 m)  normal speech, no distress, appropriate responses.     Assessment and Plan: Essential hypertension  - variable home readings. Plan for lower metoprolol  dosing at 25mg  IF systolic elevated per cardiology plan. Some of the fatigue/lightheadedness may be related to recent chemotherapy, however recommended monitoring BP at those times to eval for hypotension. Recheck with readings in 4 days. ER precautions given.   Follow Up Instructions: 4 days. Telemed.    I discussed the assessment and treatment plan with the patient. The patient was provided an opportunity to ask questions and all were answered. The patient agreed with the plan and demonstrated an understanding of the instructions.   The patient was advised to call back or seek an in-person evaluation if the symptoms worsen or if the condition fails to improve as anticipated.  I provided 16 minutes of non-face-to-face time during this encounter.   [name], MD

## 2019-04-06 NOTE — Progress Notes (Signed)
   Covid-19 Vaccination Clinic  Name:  CLARA SMOLEN    MRN: [id number] DOB: Dec 22, 1945  04/06/2019  Mr. Laduca was observed post Covid-19 immunization for 15 minutes without incidence. He was provided with Vaccine Information Sheet and instruction to access the V-Safe system.   Mr. Zobrist was instructed to call 911 with any severe reactions post vaccine: Marland Kitchen Difficulty breathing  . Swelling of your face and throat  . A fast heartbeat  . A bad rash all over your body  . Dizziness and weakness    Immunizations Administered    Name Date Dose VIS Date Route   Pfizer COVID-19 Vaccine 04/06/2019 12:58 PM 0.3 mL 01/16/2019 Intramuscular   Manufacturer: [place]   Lot: [id number]   [place]: [id number]

## 2019-04-06 NOTE — Patient Instructions (Addendum)
  Make sure to drink plenty of fluids, rest, diet as tolerated or recommended by oncology.  I agree with the plan from cardiology earlier today to take metoprolol 25 mg if your systolic blood pressure is above 110.  Check blood pressures twice per day and keep a record of those readings for our follow-up visit in 4 days.  If any new or worsening symptoms prior to that time, return for office visit sooner or emergency room as we discussed.   If you have lab work done today you will be contacted with your lab results within the next 2 weeks.  If you have not heard from [place] then please contact us. The fastest way to get your results is to register for My Chart.   IF you received an x-ray today, you will receive an invoice from [facility]. Please contact [facility] at [phone] with questions or concerns regarding your invoice.   IF you received labwork today, you will receive an invoice from [facility]. Please contact [facility] at [phone] with questions or concerns regarding your invoice.   Our billing staff will not be able to assist you with questions regarding bills from these companies.  You will be contacted with the lab results as soon as they are available. The fastest way to get your results is to activate your My Chart account. Instructions are located on the last page of this paperwork. If you have not heard from [place] regarding the results in 2 weeks, please contact this office.

## 2019-04-07 ENCOUNTER — Inpatient Hospital Stay: Payer: Medicare Other | Attending: Internal Medicine

## 2019-04-07 ENCOUNTER — Other Ambulatory Visit: Payer: Self-pay

## 2019-04-07 DIAGNOSIS — Z87891 Personal history of nicotine dependence: Secondary | ICD-10-CM | POA: Insufficient documentation

## 2019-04-07 DIAGNOSIS — C3411 Malignant neoplasm of upper lobe, right bronchus or lung: Secondary | ICD-10-CM | POA: Insufficient documentation

## 2019-04-07 DIAGNOSIS — J984 Other disorders of lung: Secondary | ICD-10-CM | POA: Insufficient documentation

## 2019-04-07 DIAGNOSIS — I739 Peripheral vascular disease, unspecified: Secondary | ICD-10-CM | POA: Diagnosis not present

## 2019-04-07 DIAGNOSIS — M5137 Other intervertebral disc degeneration, lumbosacral region: Secondary | ICD-10-CM | POA: Diagnosis not present

## 2019-04-07 DIAGNOSIS — Z5112 Encounter for antineoplastic immunotherapy: Secondary | ICD-10-CM | POA: Diagnosis not present

## 2019-04-07 DIAGNOSIS — Z9221 Personal history of antineoplastic chemotherapy: Secondary | ICD-10-CM | POA: Diagnosis not present

## 2019-04-07 DIAGNOSIS — C7972 Secondary malignant neoplasm of left adrenal gland: Secondary | ICD-10-CM | POA: Diagnosis not present

## 2019-04-07 DIAGNOSIS — I700 Atherosclerosis of aorta: Secondary | ICD-10-CM | POA: Diagnosis not present

## 2019-04-07 DIAGNOSIS — R0609 Other forms of dyspnea: Secondary | ICD-10-CM | POA: Insufficient documentation

## 2019-04-07 DIAGNOSIS — M5136 Other intervertebral disc degeneration, lumbar region: Secondary | ICD-10-CM | POA: Insufficient documentation

## 2019-04-07 DIAGNOSIS — Z87820 Personal history of traumatic brain injury: Secondary | ICD-10-CM | POA: Insufficient documentation

## 2019-04-07 DIAGNOSIS — Z79899 Other long term (current) drug therapy: Secondary | ICD-10-CM | POA: Diagnosis not present

## 2019-04-07 DIAGNOSIS — Z888 Allergy status to other drugs, medicaments and biological substances status: Secondary | ICD-10-CM | POA: Diagnosis not present

## 2019-04-07 DIAGNOSIS — I2510 Atherosclerotic heart disease of native coronary artery without angina pectoris: Secondary | ICD-10-CM | POA: Insufficient documentation

## 2019-04-07 DIAGNOSIS — Z5111 Encounter for antineoplastic chemotherapy: Secondary | ICD-10-CM | POA: Diagnosis not present

## 2019-04-07 DIAGNOSIS — Z923 Personal history of irradiation: Secondary | ICD-10-CM | POA: Insufficient documentation

## 2019-04-07 DIAGNOSIS — C7971 Secondary malignant neoplasm of right adrenal gland: Secondary | ICD-10-CM | POA: Insufficient documentation

## 2019-04-07 DIAGNOSIS — D6481 Anemia due to antineoplastic chemotherapy: Secondary | ICD-10-CM | POA: Diagnosis not present

## 2019-04-07 DIAGNOSIS — C3492 Malignant neoplasm of unspecified part of left bronchus or lung: Secondary | ICD-10-CM

## 2019-04-07 DIAGNOSIS — C7951 Secondary malignant neoplasm of bone: Secondary | ICD-10-CM | POA: Diagnosis not present

## 2019-04-07 DIAGNOSIS — I10 Essential (primary) hypertension: Secondary | ICD-10-CM | POA: Insufficient documentation

## 2019-04-07 LAB — CBC WITH DIFFERENTIAL (CANCER CENTER ONLY)
Abs Immature Granulocytes: 0.02 10*3/uL (ref 0.00–0.07)
Basophils Absolute: 0 10*3/uL (ref 0.0–0.1)
Basophils Relative: 1 %
Eosinophils Absolute: 0 10*3/uL (ref 0.0–0.5)
Eosinophils Relative: 1 %
HCT: 27.5 % — ABNORMAL LOW (ref 39.0–52.0)
Hemoglobin: 9.4 g/dL — ABNORMAL LOW (ref 13.0–17.0)
Immature Granulocytes: 1 %
Lymphocytes Relative: 20 %
Lymphs Abs: 0.4 10*3/uL — ABNORMAL LOW (ref 0.7–4.0)
MCH: 29.5 pg (ref 26.0–34.0)
MCHC: 34.2 g/dL (ref 30.0–36.0)
MCV: 86.2 fL (ref 80.0–100.0)
Monocytes Absolute: 0.1 10*3/uL (ref 0.1–1.0)
Monocytes Relative: 5 %
Neutro Abs: 1.4 10*3/uL — ABNORMAL LOW (ref 1.7–7.7)
Neutrophils Relative %: 72 %
Platelet Count: 165 10*3/uL (ref 150–400)
RBC: 3.19 MIL/uL — ABNORMAL LOW (ref 4.22–5.81)
RDW: 15 % (ref 11.5–15.5)
WBC Count: 2 10*3/uL — ABNORMAL LOW (ref 4.0–10.5)
nRBC: 0 % (ref 0.0–0.2)

## 2019-04-07 LAB — CMP (CANCER CENTER ONLY)
ALT: 17 U/L (ref 0–44)
AST: 15 U/L (ref 15–41)
Albumin: 3.4 g/dL — ABNORMAL LOW (ref 3.5–5.0)
Alkaline Phosphatase: 113 U/L (ref 38–126)
Anion gap: 12 (ref 5–15)
BUN: 18 mg/dL (ref 8–23)
CO2: 25 mmol/L (ref 22–32)
Calcium: 8.8 mg/dL — ABNORMAL LOW (ref 8.9–10.3)
Chloride: 98 mmol/L (ref 98–111)
Creatinine: 1.09 mg/dL (ref 0.61–1.24)
GFR, Est AFR Am: >60 mL/min (ref >60)
GFR, Estimated: >60 mL/min (ref >60)
Glucose, Bld: 123 mg/dL — ABNORMAL HIGH (ref 70–99)
Potassium: 3.4 mmol/L — ABNORMAL LOW (ref 3.5–5.1)
Sodium: 135 mmol/L (ref 135–145)
Total Bilirubin: 0.9 mg/dL (ref 0.3–1.2)
Total Protein: 7.2 g/dL (ref 6.5–8.1)

## 2019-04-08 DIAGNOSIS — M25672 Stiffness of left ankle, not elsewhere classified: Secondary | ICD-10-CM | POA: Diagnosis not present

## 2019-04-08 DIAGNOSIS — M6281 Muscle weakness (generalized): Secondary | ICD-10-CM | POA: Diagnosis not present

## 2019-04-08 DIAGNOSIS — R262 Difficulty in walking, not elsewhere classified: Secondary | ICD-10-CM | POA: Diagnosis not present

## 2019-04-08 DIAGNOSIS — S82832D Other fracture of upper and lower end of left fibula, subsequent encounter for closed fracture with routine healing: Secondary | ICD-10-CM | POA: Diagnosis not present

## 2019-04-10 DIAGNOSIS — R262 Difficulty in walking, not elsewhere classified: Secondary | ICD-10-CM | POA: Diagnosis not present

## 2019-04-10 DIAGNOSIS — M25672 Stiffness of left ankle, not elsewhere classified: Secondary | ICD-10-CM | POA: Diagnosis not present

## 2019-04-10 DIAGNOSIS — M6281 Muscle weakness (generalized): Secondary | ICD-10-CM | POA: Diagnosis not present

## 2019-04-10 DIAGNOSIS — S82832D Other fracture of upper and lower end of left fibula, subsequent encounter for closed fracture with routine healing: Secondary | ICD-10-CM | POA: Diagnosis not present

## 2019-04-11 ENCOUNTER — Other Ambulatory Visit: Payer: Self-pay | Admitting: Family Medicine

## 2019-04-11 DIAGNOSIS — I10 Essential (primary) hypertension: Secondary | ICD-10-CM

## 2019-04-13 DIAGNOSIS — M25672 Stiffness of left ankle, not elsewhere classified: Secondary | ICD-10-CM | POA: Diagnosis not present

## 2019-04-14 ENCOUNTER — Other Ambulatory Visit: Payer: Self-pay

## 2019-04-14 ENCOUNTER — Inpatient Hospital Stay: Payer: Medicare Other

## 2019-04-14 DIAGNOSIS — C7971 Secondary malignant neoplasm of right adrenal gland: Secondary | ICD-10-CM | POA: Diagnosis not present

## 2019-04-14 DIAGNOSIS — Z5111 Encounter for antineoplastic chemotherapy: Secondary | ICD-10-CM | POA: Diagnosis not present

## 2019-04-14 DIAGNOSIS — C3411 Malignant neoplasm of upper lobe, right bronchus or lung: Secondary | ICD-10-CM | POA: Diagnosis not present

## 2019-04-14 DIAGNOSIS — C7951 Secondary malignant neoplasm of bone: Secondary | ICD-10-CM | POA: Diagnosis not present

## 2019-04-14 DIAGNOSIS — C7972 Secondary malignant neoplasm of left adrenal gland: Secondary | ICD-10-CM | POA: Diagnosis not present

## 2019-04-14 DIAGNOSIS — C3492 Malignant neoplasm of unspecified part of left bronchus or lung: Secondary | ICD-10-CM

## 2019-04-14 DIAGNOSIS — R5382 Chronic fatigue, unspecified: Secondary | ICD-10-CM

## 2019-04-14 DIAGNOSIS — Z5112 Encounter for antineoplastic immunotherapy: Secondary | ICD-10-CM | POA: Diagnosis not present

## 2019-04-14 LAB — CBC WITH DIFFERENTIAL (CANCER CENTER ONLY)
Abs Immature Granulocytes: 0.02 10*3/uL (ref 0.00–0.07)
Basophils Absolute: 0 10*3/uL (ref 0.0–0.1)
Basophils Relative: 0 %
Eosinophils Absolute: 0 10*3/uL (ref 0.0–0.5)
Eosinophils Relative: 0 %
HCT: 27 % — ABNORMAL LOW (ref 39.0–52.0)
Hemoglobin: 9 g/dL — ABNORMAL LOW (ref 13.0–17.0)
Immature Granulocytes: 1 %
Lymphocytes Relative: 17 %
Lymphs Abs: 0.5 10*3/uL — ABNORMAL LOW (ref 0.7–4.0)
MCH: 29.7 pg (ref 26.0–34.0)
MCHC: 33.3 g/dL (ref 30.0–36.0)
MCV: 89.1 fL (ref 80.0–100.0)
Monocytes Absolute: 0.7 10*3/uL (ref 0.1–1.0)
Monocytes Relative: 21 %
Neutro Abs: 2 10*3/uL (ref 1.7–7.7)
Neutrophils Relative %: 61 %
Platelet Count: 69 10*3/uL — ABNORMAL LOW (ref 150–400)
RBC: 3.03 MIL/uL — ABNORMAL LOW (ref 4.22–5.81)
RDW: 15.9 % — ABNORMAL HIGH (ref 11.5–15.5)
WBC Count: 3.2 10*3/uL — ABNORMAL LOW (ref 4.0–10.5)
nRBC: 0 % (ref 0.0–0.2)

## 2019-04-14 LAB — CMP (CANCER CENTER ONLY)
ALT: 21 U/L (ref 0–44)
AST: 25 U/L (ref 15–41)
Albumin: 3.6 g/dL (ref 3.5–5.0)
Alkaline Phosphatase: 139 U/L — ABNORMAL HIGH (ref 38–126)
Anion gap: 12 (ref 5–15)
BUN: 12 mg/dL (ref 8–23)
CO2: 25 mmol/L (ref 22–32)
Calcium: 8.5 mg/dL — ABNORMAL LOW (ref 8.9–10.3)
Chloride: 100 mmol/L (ref 98–111)
Creatinine: 0.99 mg/dL (ref 0.61–1.24)
GFR, Est AFR Am: >60 mL/min (ref >60)
GFR, Estimated: >60 mL/min (ref >60)
Glucose, Bld: 109 mg/dL — ABNORMAL HIGH (ref 70–99)
Potassium: 3.3 mmol/L — ABNORMAL LOW (ref 3.5–5.1)
Sodium: 137 mmol/L (ref 135–145)
Total Bilirubin: 0.9 mg/dL (ref 0.3–1.2)
Total Protein: 7 g/dL (ref 6.5–8.1)

## 2019-04-14 LAB — TSH: TSH: 1.648 u[IU]/mL (ref 0.320–4.118)

## 2019-04-15 ENCOUNTER — Encounter: Payer: Self-pay | Admitting: Family Medicine

## 2019-04-15 ENCOUNTER — Telehealth (INDEPENDENT_AMBULATORY_CARE_PROVIDER_SITE_OTHER): Payer: Medicare Other | Admitting: Family Medicine

## 2019-04-15 VITALS — BP 122/87 | HR 111 | Ht 73.0 in

## 2019-04-15 DIAGNOSIS — I2510 Atherosclerotic heart disease of native coronary artery without angina pectoris: Secondary | ICD-10-CM

## 2019-04-15 DIAGNOSIS — M6281 Muscle weakness (generalized): Secondary | ICD-10-CM | POA: Diagnosis not present

## 2019-04-15 DIAGNOSIS — I10 Essential (primary) hypertension: Secondary | ICD-10-CM

## 2019-04-15 DIAGNOSIS — E876 Hypokalemia: Secondary | ICD-10-CM

## 2019-04-15 DIAGNOSIS — C3490 Malignant neoplasm of unspecified part of unspecified bronchus or lung: Secondary | ICD-10-CM

## 2019-04-15 DIAGNOSIS — R5383 Other fatigue: Secondary | ICD-10-CM | POA: Diagnosis not present

## 2019-04-15 DIAGNOSIS — S82832D Other fracture of upper and lower end of left fibula, subsequent encounter for closed fracture with routine healing: Secondary | ICD-10-CM | POA: Diagnosis not present

## 2019-04-15 DIAGNOSIS — M25672 Stiffness of left ankle, not elsewhere classified: Secondary | ICD-10-CM | POA: Diagnosis not present

## 2019-04-15 DIAGNOSIS — R262 Difficulty in walking, not elsewhere classified: Secondary | ICD-10-CM | POA: Diagnosis not present

## 2019-04-15 NOTE — Progress Notes (Signed)
Virtual Visit via Video Note  I connected with Seth [name] on 04/15/19 at 5:21 PM by a video enabled telemedicine application [employer].  reverted to audio after 1st few minutes due to connection issues.  verified that I am speaking with the correct person using two identifiers.   I discussed the limitations, risks, security and privacy concerns of performing an evaluation and management service by telephone and the availability of in person appointments. I also discussed with the patient that there may be a patient responsible charge related to this service. The patient expressed understanding and agreed to proceed, consent obtained.   Chief complaint: Chief Complaint  Patient presents with  . Follow-up    on hypertention.  pt  hasn't had any issues with his BP since last visit. pt checks his BP 2 times daily. pt  states he has had a few dizy spells along with irregular heart beat cardiology noticed pt's heart was skipping a beat. pt states his leg wounds have healed up.     History of Present Illness: Seth [name] is a 74 y.o. male  Follow up from 3/1 visit.   Hypertension: Low BP noted at chemo/oncology visit 2/23. Held lisinopril and metoprolol at that time. BP elevated soon after and restarted metoprolol 50mg  BID (down from 75mg ). Cardiology eval 3/1 - decreased metoprolol to 25mg  bid ONLY if systolic greater than 606. Continued to hold lisinopril. Had some weight loss, decreased appetite after most recent chemo. Variable BP readings last visit. Some increased fatigue. Planned to continue rest, fluids as tolerated and metoprolol IF systolic over 301.  Plan for cardiac echo in 8 days with prior chemo.  Since last visit. Feeling fair - about the same as last visit. Occasional dizzy spells - only 2 or 3 times for a few seconds. BP looked ok during those spells - 601-093 systolic. Resolved with lying head down. drinking fluids. No new chest pains/palpitations. ?possible irregular HR on  monitor - asymptomatic at those times. Only noted at night. Did discuss this with cardiology. Sinus rhythm on EKG.   Has been taking metoprolol 25mg  BID. Only one time of reading below 110 and skipped dose of metoprolol just that 1 time.  Highest reading in the 130's.  Appetite still decreased - about halfway. appt with oncology in 6 days. Labs noted from yesterday.  Has bloodwork planned next week.  Has compazine if needed - does have nausea. 1 episode of emesis in past 9 days.    Home readings: BP Readings from Last 3 Encounters:  04/15/19 122/87  04/06/19 122/83  04/06/19 110/78   Lab Results  Component Value Date   CREATININE 0.99 04/14/2019    Results for orders placed or performed in visit on 04/14/19  CMP ([facility] only)  Result Value Ref Range   Sodium 137 135 - 145 mmol/L   Potassium 3.3 (L) 3.5 - 5.1 mmol/L   Chloride 100 98 - 111 mmol/L   CO2 25 22 - 32 mmol/L   Glucose, Bld 109 (H) 70 - 99 mg/dL   BUN 12 8 - 23 mg/dL   Creatinine 0.99 0.61 - 1.24 mg/dL   Calcium 8.5 (L) 8.9 - 10.3 mg/dL   Total Protein 7.0 6.5 - 8.1 g/dL   Albumin 3.6 3.5 - 5.0 g/dL   AST 25 15 - 41 U/L   ALT 21 0 - 44 U/L   Alkaline Phosphatase 139 (H) 38 - 126 U/L   Total Bilirubin 0.9 0.3 - 1.2  mg/dL   GFR, Est Non Af Am >60 >60 mL/min   GFR, Est AFR Am >60 >60 mL/min   Anion gap 12 5 - 15  CBC with Differential (Cancer Center Only)  Result Value Ref Range   WBC Count 3.2 (L) 4.0 - 10.5 K/uL   RBC 3.03 (L) 4.22 - 5.81 MIL/uL   Hemoglobin 9.0 (L) 13.0 - 17.0 g/dL   HCT 27.0 (L) 39.0 - 52.0 %   MCV 89.1 80.0 - 100.0 fL   MCH 29.7 26.0 - 34.0 pg   MCHC 33.3 30.0 - 36.0 g/dL   RDW 15.9 (H) 11.5 - 15.5 %   Platelet Count 69 (L) 150 - 400 K/uL   nRBC 0.0 0.0 - 0.2 %   Neutrophils Relative % 61 %   Neutro Abs 2.0 1.7 - 7.7 K/uL   Lymphocytes Relative 17 %   Lymphs Abs 0.5 (L) 0.7 - 4.0 K/uL   Monocytes Relative 21 %   Monocytes Absolute 0.7 0.1 - 1.0 K/uL   Eosinophils Relative  0 %   Eosinophils Absolute 0.0 0.0 - 0.5 K/uL   Basophils Relative 0 %   Basophils Absolute 0.0 0.0 - 0.1 K/uL   Immature Granulocytes 1 %   Abs Immature Granulocytes 0.02 0.00 - 0.07 K/uL  TSH  Result Value Ref Range   TSH 1.648 0.320 - 4.118 uIU/mL      Patient Active Problem List   Diagnosis Date Noted  . Adenocarcinoma of left lung, stage 4 ([place]) 02/03/2019  . Encounter for antineoplastic immunotherapy 02/03/2019  . Frequent nosebleeds 05/28/2018  . Encounter for antineoplastic chemotherapy 04/18/2018  . Goals of care, counseling/discussion 04/18/2018  . Adenocarcinoma of left lung, stage 2 ([place]) 03/27/2018  . Abnormal PET of left lung   . S/P bronchoscopy   . Solitary lung nodule 02/20/2018  . Hilar adenopathy 02/20/2018  . Abnormal findings on diagnostic imaging of lung 02/20/2018  . Platelet inhibition due to Plavix 02/20/2018  . PVD (peripheral vascular disease) ([place]) 10/15/2013  . Tobacco use disorder 10/15/2013  . Pure hypercholesterolemia 10/15/2013  . Coronary artery disease involving native coronary artery of native heart without angina pectoris 10/15/2013  . Cataract 09/29/2013  . Colon cancer screening 09/25/2013  . AAA (abdominal aortic aneurysm) without rupture ([place]) 10/22/2012  . Nicotine addiction 10/17/2012  . Atherosclerosis of native arteries of the extremities with ulceration(440.23) 10/17/2012  . Carotid bruit 10/17/2012  . Other nonspecific abnormal cardiovascular system function study 04/10/2012  . History of sciatica   . Essential hypertension, benign   . Seasonal allergies    Past Medical History:  Diagnosis Date  . AAA (abdominal aortic aneurysm) ([place])   . Allergic rhinitis   . Anxiety   . Back pain   . CAD (coronary artery disease)   . Cataracts, bilateral   . Emphysema, unspecified ([place])     " moderate"  . Essential hypertension, benign   . GERD (gastroesophageal reflux disease)   . Gout   . Headache    migraines  . Hilar  adenopathy   . History of hiatal hernia   . History of sciatica   . Hyperlipidemia   . lung ca dx'd 02/2018  . Lung nodule   . Pneumonia    as child  . Prostate hypertrophy   . PVD (peripheral vascular disease) ([place])   . Seasonal allergies   . Subclavian steal syndrome of left subclavian artery   . Wears dentures    Past Surgical History:  Procedure Laterality Date  . CARDIAC CATHETERIZATION     stent placement  . COLONOSCOPY W/ BIOPSIES AND POLYPECTOMY    . ILIAC ARTERY STENT    . IR IMAGING GUIDED PORT INSERTION  03/23/2019  . MULTIPLE TOOTH EXTRACTIONS    . VIDEO BRONCHOSCOPY WITH ENDOBRONCHIAL NAVIGATION N/A 02/26/2018   Procedure: VIDEO BRONCHOSCOPY WITH ENDOBRONCHIAL NAVIGATION;  Surgeon: Garner Nash, DO;  Location: [facility];  Service: Thoracic;  Laterality: N/A;  . VIDEO BRONCHOSCOPY WITH ENDOBRONCHIAL NAVIGATION N/A 03/19/2018   Procedure: VIDEO BRONCHOSCOPY WITH ENDOBRONCHIAL NAVIGATION and endobronchial ultrasound;  Surgeon: Garner Nash, DO;  Location: [facility];  Service: Thoracic;  Laterality: N/A;  . VIDEO BRONCHOSCOPY WITH ENDOBRONCHIAL ULTRASOUND N/A 02/26/2018   Procedure: VIDEO BRONCHOSCOPY WITH ENDOBRONCHIAL ULTRASOUND;  Surgeon: Garner Nash, DO;  Location: [facility];  Service: Thoracic;  Laterality: N/A;  . VIDEO BRONCHOSCOPY WITH ENDOBRONCHIAL ULTRASOUND N/A 03/19/2018   Procedure: NAVIGATION BRONCHOSCOPY;  Surgeon: Garner Nash, DO;  Location: [facility];  Service: Thoracic;  Laterality: N/A;   Allergies  Allergen Reactions  . Biaxin [Clarithromycin] Shortness Of Breath  . Clarithromycin Shortness Of Breath  . Iodinated Diagnostic Agents Hives  . Prednisone Other (See Comments)    Patient states he turns REALLY RED all over.  . Cortisone Other (See Comments)    Turns red from breast up  . Flomax [Tamsulosin Hcl]     Blurred vision and lower back pain   . Hepatitis B Virus Vaccines     Nerve problems States his previous doctor told him it was due to this    . Iodine Hives    Dye from nuclear medicine test   Prior to Admission medications   Medication Sig Start Date End Date Taking? Authorizing Provider  cetirizine (ZYRTEC) 10 MG tablet Take 10 mg by mouth daily at 12 noon.   Yes [provider]  clopidogrel (PLAVIX) 75 MG tablet TAKE 1 TABLET BY MOUTH EVERY DAY 02/19/19  Yes [name], MD  famotidine (PEPCID) 20 MG tablet Take 20 mg by mouth 2 (two) times daily. Noon & in the evening.   Yes [provider]  folic acid (FOLVITE) 1 MG tablet Take 1 tablet (1 mg total) by mouth daily. 02/03/19  Yes Curt Bears, MD  lidocaine-prilocaine (EMLA) cream Apply to Port-A-Cath site 30 minutes before infusion. 03/10/19  Yes Curt Bears, MD  magic mouthwash SOLN Take 5 mLs by mouth 4 (four) times daily as needed for mouth pain. 04/03/19  Yes Heilingoetter, Cassandra L, PA-C  metoprolol tartrate (LOPRESSOR) 25 MG tablet Take 1 tablet (25 mg total) by mouth 2 (two) times daily. Only take if systolic blood pressure is >110 04/06/19  Yes Turner, Traci R, MD  Olopatadine HCl 0.2 % SOLN 1 drop daily.   Yes [provider]  prochlorperazine (COMPAZINE) 10 MG tablet Take 1 tablet (10 mg total) by mouth every 6 (six) hours as needed for nausea or vomiting. 02/03/19  Yes Curt Bears, MD  sodium chloride 0.9 % SOLN 100 mL with lidocaine-EPINEPHrine 0.5 %-1:200000 SOLN 100 mL    Yes [provider]  terbinafine (LAMISIL) 1 % cream Apply 1 application topically 2 (two) times daily.   Yes [provider]  atorvastatin (LIPITOR) 40 MG tablet Take 1 tablet (40 mg total) by mouth daily. 04/09/18 04/09/19  Sueanne Margarita, MD   Social History   Socioeconomic History  . Marital status: Married    Spouse name: Not on file  .  Number of children: 1  . Years of education: Not on file  . Highest education level: Not on file  Occupational History  . Occupation: retired  Tobacco Use  . Smoking status: Former Smoker     Packs/day: 3.00    Years: 50.00    Pack years: 150.00    Types: Cigarettes    Quit date: 04/05/2016    Years since quitting: 3.0  . Smokeless tobacco: Never Used  Substance and Sexual Activity  . Alcohol use: Yes    Alcohol/week: 0.0 standard drinks    Comment: rare  . Drug use: No  . Sexual activity: Not on file  Other Topics Concern  . Not on file  Social History Narrative   Married   Exercise: No   Education: GED   Social Determinants of Radio broadcast assistant Strain:   . Difficulty of Paying Living Expenses: Not on file  Food Insecurity:   . Worried About Charity fundraiser in the Last Year: Not on file  . Ran Out of Food in the Last Year: Not on file  Transportation Needs:   . Lack of Transportation (Medical): Not on file  . Lack of Transportation (Non-Medical): Not on file  Physical Activity:   . Days of Exercise per Week: Not on file  . Minutes of Exercise per Session: Not on file  Stress:   . Feeling of Stress : Not on file  Social Connections:   . Frequency of Communication with Friends and Family: Not on file  . Frequency of Social Gatherings with Friends and Family: Not on file  . Attends Religious Services: Not on file  . Active Member of Clubs or Organizations: Not on file  . Attends Archivist Meetings: Not on file  . Marital Status: Not on file  Intimate Partner Violence:   . Fear of Current or Ex-Partner: Not on file  . Emotionally Abused: Not on file  . Physically Abused: Not on file  . Sexually Abused: Not on file    Observations/Objective: Vitals:   04/15/19 1320  BP: 122/87  Pulse: (!) 111  Height: 6\' 1"  (1.854 m)  Nontoxic-appearing on phone, video.  Normal speech, no distress.  All questions answered with understanding of plan expressed  Assessment and Plan: Essential hypertension  Hypokalemia  Fatigue, unspecified type  Malignant neoplasm of lung, unspecified laterality, unspecified part of lung (HCC)  Persistent  nausea, fatigue, likely component related to previous chemotherapy.  Heart rate variability noted on monitor but not specifically change in symptoms during those times.  -Urgent care/ER precautions given if new symptoms at the time of the regular reading on blood pressure monitor.  No apparent arrhythmia on prior EKG.  -Has Compazine if needed for nausea, but discussed other options if needed, may need to discuss with oncology.  -Mild hypokalemia discussed.  Plan for initial potassium rich foods if possible, but can be rechecked at upcoming oncology visit.  Follow Up Instructions: Patient Instructions   Good talking to you today.  I am sorry to hear that you are still fatigued, but as we discussed there can be many causes including with recent chemotherapy, anemia or low blood counts, decreased food intake, and the nausea with previous chemotherapy.   Compazine has been prescribed by oncology to use for nausea/vomiting if needed.  If that is not helping your symptoms, I would recommend discussing other options with your oncology team.  If your blood pressure machine is continuing to read an  irregular heart rate, I would recommend calling your cardiologist.  If you have any symptoms of heart arrhythmia such as heart palpitations, feeling lightheaded or dizzy when the machine is reading at an irregular level, or irregular pulse, I would recommend evaluation in a medical provider whether that be here, cardiology, or urgent care/ER to make sure that you are not having a heart arrhythmia.  There was not a sign of that on your last EKG at cardiology.  Potassium was low on your recent blood work.  Potassium rich foods such as bananas, sweet potatoes, spinach can be helpful.  Try incorporating those in the foods if possible until you are seen for follow-up testing next week with oncology.  Return to the clinic or go to the nearest emergency room if any of your symptoms worsen or new symptoms occur.    If  you have lab work done today you will be contacted with your lab results within the next 2 weeks.  If you have not heard from [place] then please contact us. The fastest way to get your results is to register for My Chart.   IF you received an x-ray today, you will receive an invoice from [facility]. Please contact [facility] at [phone] with questions or concerns regarding your invoice.   IF you received labwork today, you will receive an invoice from [facility]. Please contact [facility] at [phone] with questions or concerns regarding your invoice.   Our billing staff will not be able to assist you with questions regarding bills from these companies.  You will be contacted with the lab results as soon as they are available. The fastest way to get your results is to activate your My Chart account. Instructions are located on the last page of this paperwork. If you have not heard from [place] regarding the results in 2 weeks, please contact this office.         I discussed the assessment and treatment plan with the patient. The patient was provided an opportunity to ask questions and all were answered. The patient agreed with the plan and demonstrated an understanding of the instructions.   The patient was advised to call back or seek an in-person evaluation if the symptoms worsen or if the condition fails to improve as anticipated.  I provided 21 minutes of non-face-to-face time during this encounter.   [name], MD

## 2019-04-15 NOTE — Patient Instructions (Addendum)
Good talking to you today.  I am sorry to hear that you are still fatigued, but as we discussed there can be many causes including with recent chemotherapy, anemia or low blood counts, decreased food intake, and the nausea with previous chemotherapy.   Compazine has been prescribed by oncology to use for nausea/vomiting if needed.  If that is not helping your symptoms, I would recommend discussing other options with your oncology team.  If your blood pressure machine is continuing to read an irregular heart rate, I would recommend calling your cardiologist.  If you have any symptoms of heart arrhythmia such as heart palpitations, feeling lightheaded or dizzy when the machine is reading at an irregular level, or irregular pulse, I would recommend evaluation in a medical provider whether that be here, cardiology, or urgent care/ER to make sure that you are not having a heart arrhythmia.  There was not a sign of that on your last EKG at cardiology.  Potassium was low on your recent blood work.  Potassium rich foods such as bananas, sweet potatoes, spinach can be helpful.  Try incorporating those in the foods if possible until you are seen for follow-up testing next week with oncology.  Return to the clinic or go to the nearest emergency room if any of your symptoms worsen or new symptoms occur.    If you have lab work done today you will be contacted with your lab results within the next 2 weeks.  If you have not heard from [place] then please contact us. The fastest way to get your results is to register for My Chart.   IF you received an x-ray today, you will receive an invoice from [facility]. Please contact [facility] at [phone] with questions or concerns regarding your invoice.   IF you received labwork today, you will receive an invoice from [facility]. Please contact [facility] at [phone] with questions or concerns regarding your invoice.   Our billing staff will not be  able to assist you with questions regarding bills from these companies.  You will be contacted with the lab results as soon as they are available. The fastest way to get your results is to activate your My Chart account. Instructions are located on the last page of this paperwork. If you have not heard from [place] regarding the results in 2 weeks, please contact this office.

## 2019-04-16 ENCOUNTER — Encounter: Payer: Medicare Other | Admitting: Vascular Surgery

## 2019-04-16 DIAGNOSIS — H02055 Trichiasis without entropian left lower eyelid: Secondary | ICD-10-CM | POA: Diagnosis not present

## 2019-04-17 ENCOUNTER — Inpatient Hospital Stay (HOSPITAL_COMMUNITY)
Admission: RE | Admit: 2019-04-17 | Discharge: 2019-04-17 | Disposition: A | Discharge status: Home / Self Care | Payer: Medicare Other | Source: Ambulatory Visit | Attending: Internal Medicine | Admitting: Internal Medicine

## 2019-04-17 ENCOUNTER — Other Ambulatory Visit: Payer: Self-pay

## 2019-04-17 DIAGNOSIS — C3490 Malignant neoplasm of unspecified part of unspecified bronchus or lung: Secondary | ICD-10-CM | POA: Insufficient documentation

## 2019-04-17 DIAGNOSIS — M25672 Stiffness of left ankle, not elsewhere classified: Secondary | ICD-10-CM | POA: Diagnosis not present

## 2019-04-17 DIAGNOSIS — C3412 Malignant neoplasm of upper lobe, left bronchus or lung: Secondary | ICD-10-CM | POA: Diagnosis not present

## 2019-04-17 DIAGNOSIS — R262 Difficulty in walking, not elsewhere classified: Secondary | ICD-10-CM | POA: Diagnosis not present

## 2019-04-17 DIAGNOSIS — C7971 Secondary malignant neoplasm of right adrenal gland: Secondary | ICD-10-CM | POA: Diagnosis not present

## 2019-04-17 DIAGNOSIS — M6281 Muscle weakness (generalized): Secondary | ICD-10-CM | POA: Diagnosis not present

## 2019-04-17 DIAGNOSIS — C7951 Secondary malignant neoplasm of bone: Secondary | ICD-10-CM | POA: Diagnosis not present

## 2019-04-17 DIAGNOSIS — S82832D Other fracture of upper and lower end of left fibula, subsequent encounter for closed fracture with routine healing: Secondary | ICD-10-CM | POA: Diagnosis not present

## 2019-04-18 ENCOUNTER — Inpatient Hospital Stay (HOSPITAL_COMMUNITY)
Admission: EM | Admit: 2019-04-18 | Discharge: 2019-04-20 | DRG: 312 | Disposition: A | Discharge status: Home / Self Care | Payer: Medicare Other | Attending: Internal Medicine | Admitting: Internal Medicine

## 2019-04-18 ENCOUNTER — Other Ambulatory Visit: Payer: Self-pay

## 2019-04-18 ENCOUNTER — Observation Stay (HOSPITAL_COMMUNITY): Payer: Medicare Other

## 2019-04-18 ENCOUNTER — Encounter (HOSPITAL_COMMUNITY): Payer: Self-pay | Admitting: Internal Medicine

## 2019-04-18 ENCOUNTER — Emergency Department (HOSPITAL_COMMUNITY): Payer: Medicare Other

## 2019-04-18 DIAGNOSIS — I10 Essential (primary) hypertension: Secondary | ICD-10-CM | POA: Diagnosis present

## 2019-04-18 DIAGNOSIS — R778 Other specified abnormalities of plasma proteins: Secondary | ICD-10-CM | POA: Diagnosis not present

## 2019-04-18 DIAGNOSIS — Z91041 Radiographic dye allergy status: Secondary | ICD-10-CM

## 2019-04-18 DIAGNOSIS — I248 Other forms of acute ischemic heart disease: Secondary | ICD-10-CM | POA: Diagnosis present

## 2019-04-18 DIAGNOSIS — I959 Hypotension, unspecified: Secondary | ICD-10-CM | POA: Diagnosis not present

## 2019-04-18 DIAGNOSIS — T451X5A Adverse effect of antineoplastic and immunosuppressive drugs, initial encounter: Secondary | ICD-10-CM | POA: Diagnosis present

## 2019-04-18 DIAGNOSIS — Z8249 Family history of ischemic heart disease and other diseases of the circulatory system: Secondary | ICD-10-CM

## 2019-04-18 DIAGNOSIS — Z806 Family history of leukemia: Secondary | ICD-10-CM

## 2019-04-18 DIAGNOSIS — Z03818 Encounter for observation for suspected exposure to other biological agents ruled out: Secondary | ICD-10-CM | POA: Diagnosis not present

## 2019-04-18 DIAGNOSIS — J984 Other disorders of lung: Secondary | ICD-10-CM | POA: Diagnosis not present

## 2019-04-18 DIAGNOSIS — R7989 Other specified abnormal findings of blood chemistry: Secondary | ICD-10-CM | POA: Diagnosis not present

## 2019-04-18 DIAGNOSIS — C3492 Malignant neoplasm of unspecified part of left bronchus or lung: Secondary | ICD-10-CM | POA: Diagnosis present

## 2019-04-18 DIAGNOSIS — E785 Hyperlipidemia, unspecified: Secondary | ICD-10-CM | POA: Diagnosis present

## 2019-04-18 DIAGNOSIS — Z79899 Other long term (current) drug therapy: Secondary | ICD-10-CM

## 2019-04-18 DIAGNOSIS — Z95828 Presence of other vascular implants and grafts: Secondary | ICD-10-CM

## 2019-04-18 DIAGNOSIS — C7971 Secondary malignant neoplasm of right adrenal gland: Secondary | ICD-10-CM | POA: Diagnosis present

## 2019-04-18 DIAGNOSIS — K219 Gastro-esophageal reflux disease without esophagitis: Secondary | ICD-10-CM | POA: Diagnosis present

## 2019-04-18 DIAGNOSIS — E861 Hypovolemia: Secondary | ICD-10-CM

## 2019-04-18 DIAGNOSIS — K649 Unspecified hemorrhoids: Secondary | ICD-10-CM | POA: Diagnosis present

## 2019-04-18 DIAGNOSIS — R000 Tachycardia, unspecified: Secondary | ICD-10-CM | POA: Diagnosis present

## 2019-04-18 DIAGNOSIS — I739 Peripheral vascular disease, unspecified: Secondary | ICD-10-CM | POA: Diagnosis present

## 2019-04-18 DIAGNOSIS — E869 Volume depletion, unspecified: Secondary | ICD-10-CM | POA: Diagnosis present

## 2019-04-18 DIAGNOSIS — Z8349 Family history of other endocrine, nutritional and metabolic diseases: Secondary | ICD-10-CM

## 2019-04-18 DIAGNOSIS — I714 Abdominal aortic aneurysm, without rupture: Secondary | ICD-10-CM | POA: Diagnosis present

## 2019-04-18 DIAGNOSIS — Z7902 Long term (current) use of antithrombotics/antiplatelets: Secondary | ICD-10-CM

## 2019-04-18 DIAGNOSIS — Z881 Allergy status to other antibiotic agents status: Secondary | ICD-10-CM

## 2019-04-18 DIAGNOSIS — E876 Hypokalemia: Secondary | ICD-10-CM | POA: Diagnosis present

## 2019-04-18 DIAGNOSIS — I2510 Atherosclerotic heart disease of native coronary artery without angina pectoris: Secondary | ICD-10-CM | POA: Diagnosis present

## 2019-04-18 DIAGNOSIS — R42 Dizziness and giddiness: Secondary | ICD-10-CM

## 2019-04-18 DIAGNOSIS — Z888 Allergy status to other drugs, medicaments and biological substances status: Secondary | ICD-10-CM

## 2019-04-18 DIAGNOSIS — Z955 Presence of coronary angioplasty implant and graft: Secondary | ICD-10-CM

## 2019-04-18 DIAGNOSIS — Z887 Allergy status to serum and vaccine status: Secondary | ICD-10-CM

## 2019-04-18 DIAGNOSIS — H269 Unspecified cataract: Secondary | ICD-10-CM | POA: Diagnosis present

## 2019-04-18 DIAGNOSIS — D630 Anemia in neoplastic disease: Secondary | ICD-10-CM | POA: Diagnosis present

## 2019-04-18 DIAGNOSIS — J439 Emphysema, unspecified: Secondary | ICD-10-CM | POA: Diagnosis not present

## 2019-04-18 DIAGNOSIS — I9589 Other hypotension: Secondary | ICD-10-CM

## 2019-04-18 DIAGNOSIS — I952 Hypotension due to drugs: Principal | ICD-10-CM | POA: Diagnosis present

## 2019-04-18 DIAGNOSIS — Z87891 Personal history of nicotine dependence: Secondary | ICD-10-CM

## 2019-04-18 DIAGNOSIS — D649 Anemia, unspecified: Secondary | ICD-10-CM

## 2019-04-18 DIAGNOSIS — Z823 Family history of stroke: Secondary | ICD-10-CM

## 2019-04-18 DIAGNOSIS — J302 Other seasonal allergic rhinitis: Secondary | ICD-10-CM | POA: Diagnosis present

## 2019-04-18 DIAGNOSIS — Z20822 Contact with and (suspected) exposure to covid-19: Secondary | ICD-10-CM | POA: Diagnosis present

## 2019-04-18 DIAGNOSIS — Z86010 Personal history of colonic polyps: Secondary | ICD-10-CM

## 2019-04-18 DIAGNOSIS — C7972 Secondary malignant neoplasm of left adrenal gland: Secondary | ICD-10-CM | POA: Diagnosis present

## 2019-04-18 DIAGNOSIS — E7800 Pure hypercholesterolemia, unspecified: Secondary | ICD-10-CM | POA: Diagnosis present

## 2019-04-18 DIAGNOSIS — Z833 Family history of diabetes mellitus: Secondary | ICD-10-CM

## 2019-04-18 LAB — CBC WITH DIFFERENTIAL/PLATELET
Abs Immature Granulocytes: 0.03 10*3/uL (ref 0.00–0.07)
Basophils Absolute: 0 10*3/uL (ref 0.0–0.1)
Basophils Relative: 0 %
Eosinophils Absolute: 0 10*3/uL (ref 0.0–0.5)
Eosinophils Relative: 0 %
HCT: 25.8 % — ABNORMAL LOW (ref 39.0–52.0)
Hemoglobin: 8.4 g/dL — ABNORMAL LOW (ref 13.0–17.0)
Immature Granulocytes: 1 %
Lymphocytes Relative: 10 %
Lymphs Abs: 0.4 10*3/uL — ABNORMAL LOW (ref 0.7–4.0)
MCH: 29.3 pg (ref 26.0–34.0)
MCHC: 32.6 g/dL (ref 30.0–36.0)
MCV: 89.9 fL (ref 80.0–100.0)
Monocytes Absolute: 0.7 10*3/uL (ref 0.1–1.0)
Monocytes Relative: 16 %
Neutro Abs: 3.3 10*3/uL (ref 1.7–7.7)
Neutrophils Relative %: 73 %
Platelets: 123 10*3/uL — ABNORMAL LOW (ref 150–400)
RBC: 2.87 MIL/uL — ABNORMAL LOW (ref 4.22–5.81)
RDW: 17.6 % — ABNORMAL HIGH (ref 11.5–15.5)
WBC: 4.5 10*3/uL (ref 4.0–10.5)
nRBC: 0 % (ref 0.0–0.2)

## 2019-04-18 LAB — BASIC METABOLIC PANEL
Anion gap: 11 (ref 5–15)
BUN: 14 mg/dL (ref 8–23)
CO2: 24 mmol/L (ref 22–32)
Calcium: 8.7 mg/dL — ABNORMAL LOW (ref 8.9–10.3)
Chloride: 98 mmol/L (ref 98–111)
Creatinine, Ser: 0.94 mg/dL (ref 0.61–1.24)
GFR calc Af Amer: >60 mL/min (ref >60)
GFR calc non Af Amer: >60 mL/min (ref >60)
Glucose, Bld: 120 mg/dL — ABNORMAL HIGH (ref 70–99)
Potassium: 3.3 mmol/L — ABNORMAL LOW (ref 3.5–5.1)
Sodium: 133 mmol/L — ABNORMAL LOW (ref 135–145)

## 2019-04-18 LAB — D-DIMER, QUANTITATIVE: D-Dimer, Quant: 2.09 ug/mL-FEU — ABNORMAL HIGH (ref 0.00–0.50)

## 2019-04-18 LAB — MAGNESIUM: Magnesium: 1.4 mg/dL — ABNORMAL LOW (ref 1.7–2.4)

## 2019-04-18 LAB — TROPONIN I (HIGH SENSITIVITY)
Troponin I (High Sensitivity): 108 ng/L (ref <18)
Troponin I (High Sensitivity): 109 ng/L (ref <18)

## 2019-04-18 MED ORDER — FAMOTIDINE 20 MG PO TABS
20.0000 mg | ORAL_TABLET | Freq: Two times a day (BID) | ORAL | Status: DC
  Administered 2019-04-18 – 2019-04-20 (×4): 20 mg via ORAL
  Filled 2019-04-18 (×4): qty 1

## 2019-04-18 MED ORDER — HEPARIN BOLUS VIA INFUSION
2500.0000 [IU] | Freq: Once | INTRAVENOUS | Status: AC
  Administered 2019-04-18: 2500 [IU] via INTRAVENOUS
  Filled 2019-04-18: qty 2500

## 2019-04-18 MED ORDER — POTASSIUM CHLORIDE CRYS ER 20 MEQ PO TBCR
20.0000 meq | EXTENDED_RELEASE_TABLET | Freq: Every day | ORAL | Status: DC
  Administered 2019-04-18 – 2019-04-19 (×2): 20 meq via ORAL
  Filled 2019-04-18 (×3): qty 1

## 2019-04-18 MED ORDER — ONDANSETRON HCL 4 MG PO TABS: 4.0000 mg | ORAL_TABLET | Freq: Four times a day (QID) | ORAL | Status: DC | PRN

## 2019-04-18 MED ORDER — ONDANSETRON HCL 4 MG/2ML IJ SOLN: 4.0000 mg | Freq: Four times a day (QID) | INTRAMUSCULAR | Status: DC | PRN

## 2019-04-18 MED ORDER — HEPARIN (PORCINE) 25000 UT/250ML-% IV SOLN
1400.0000 [IU]/h | INTRAVENOUS | Status: DC
  Administered 2019-04-18: 1400 [IU]/h via INTRAVENOUS
  Filled 2019-04-18: qty 250

## 2019-04-18 MED ORDER — MAGIC MOUTHWASH
5.0000 mL | Freq: Four times a day (QID) | ORAL | Status: DC | PRN
  Filled 2019-04-18: qty 5

## 2019-04-18 MED ORDER — METOPROLOL TARTRATE 25 MG PO TABS
25.0000 mg | ORAL_TABLET | Freq: Two times a day (BID) | ORAL | Status: DC
  Administered 2019-04-18: 25 mg via ORAL
  Filled 2019-04-18: qty 1

## 2019-04-18 MED ORDER — ACETAMINOPHEN 650 MG RE SUPP: 650.0000 mg | Freq: Four times a day (QID) | RECTAL | Status: DC | PRN

## 2019-04-18 MED ORDER — TECHNETIUM TO 99M ALBUMIN AGGREGATED
1.4000 | Freq: Once | INTRAVENOUS | Status: AC
  Administered 2019-04-18: 1.4 via INTRAVENOUS

## 2019-04-18 MED ORDER — ACETAMINOPHEN 325 MG PO TABS
650.0000 mg | ORAL_TABLET | Freq: Four times a day (QID) | ORAL | Status: DC | PRN
  Administered 2019-04-19 – 2019-04-20 (×2): 650 mg via ORAL
  Filled 2019-04-18 (×2): qty 2

## 2019-04-18 MED ORDER — DEXTROSE-NACL 5-0.45 % IV SOLN: INTRAVENOUS | Status: DC

## 2019-04-18 MED ORDER — PROCHLORPERAZINE MALEATE 10 MG PO TABS: 10.0000 mg | ORAL_TABLET | Freq: Four times a day (QID) | ORAL | Status: DC | PRN

## 2019-04-18 MED ORDER — ATORVASTATIN CALCIUM 40 MG PO TABS
40.0000 mg | ORAL_TABLET | Freq: Every day | ORAL | Status: DC
  Administered 2019-04-19 – 2019-04-20 (×2): 40 mg via ORAL
  Filled 2019-04-18 (×2): qty 1

## 2019-04-18 MED ORDER — SODIUM CHLORIDE 0.9 % IV BOLUS
1000.0000 mL | Freq: Once | INTRAVENOUS | Status: AC
  Administered 2019-04-18: 1000 mL via INTRAVENOUS

## 2019-04-18 NOTE — ED Notes (Signed)
Patient uncomfortable with thought of waiting in lobby due to chemo/cancer status. Allowed patient to wait in [facility] room.

## 2019-04-18 NOTE — ED Provider Notes (Addendum)
[facility] DEPT Provider Note   CSN: [id number] Arrival date & time: 04/18/19  1339     History Chief Complaint  Patient presents with  . Hypotension  . Dizziness    Seth [name] is a 74 y.o. male the history of 4 cm AAA, coronary artery disease, stage IIb non-small lung cancer, on chemo every 3 weeks (carboplatin, alimta, keytruda), presenting to the ED with hypotension.  Patient reports that earlier today he was feeling lightheaded.  He checked his blood pressure and it was 83/67.  He called ambulance come to the ED because he was told to do so if his pressure dropped as low.  He has been having difficulty with low blood pressure for a while now.  He tells me his doctors are not certain if this is related to his chemotherapy.  His doctors have scaled back and stop most of his blood pressure medications.  He is currently only taking Lopressor 25 mg twice daily for rate control, but does not take it is his SBP is < 110 mmhg. he did not take any metoprolol yesterday or today.  Otherwise he has been taking it nearly daily this past week.  Today he also reported some lightheadedness when he was checking his blood pressure.  He states his heart rate was in the 140s.  Currently in the ED he is asymptomatic and says he feels "normal."    He is unaware of any history of atrial fibrillation.  He does take Plavix for coronary artery disease but is not on any other anticoagulation.  He has no history of pulmonary embolism.  He denies any chest pain or shortness of breath.  He denies any abdominal pain.  He quit smoking about 3 years ago.  His cardiologist is Dr. Golden Hurter who he saw on March 1.  There is no mention of A Fib in her note at that time.  He has an echocardiogram scheduled for 04/23/2019.  Last echo was 04/08/18 showing EF 60-65%, normal aortic root and ascending aorta, normal RV function.  HPI     Past Medical History:  Diagnosis Date  . AAA  (abdominal aortic aneurysm) ([place])   . Allergic rhinitis   . Anxiety   . Back pain   . CAD (coronary artery disease)   . Cataracts, bilateral   . Emphysema, unspecified ([place])     " moderate"  . Essential hypertension, benign   . GERD (gastroesophageal reflux disease)   . Gout   . Headache    migraines  . Hilar adenopathy   . History of hiatal hernia   . History of sciatica   . Hyperlipidemia   . lung ca dx'd 02/2018  . Lung nodule   . Pneumonia    as child  . Prostate hypertrophy   . PVD (peripheral vascular disease) ([place])   . Seasonal allergies   . Subclavian steal syndrome of left subclavian artery   . Wears dentures     Patient Active Problem List   Diagnosis Date Noted  . Hypotension 04/18/2019  . Elevated troponin level 04/18/2019  . Elevated troponin 04/18/2019  . Adenocarcinoma of left lung, stage 4 ([place]) 02/03/2019  . Encounter for antineoplastic immunotherapy 02/03/2019  . Frequent nosebleeds 05/28/2018  . Encounter for antineoplastic chemotherapy 04/18/2018  . Goals of care, counseling/discussion 04/18/2018  . Adenocarcinoma of left lung, stage 2 ([place]) 03/27/2018  . Abnormal PET of left lung   . S/P bronchoscopy   . Solitary  lung nodule 02/20/2018  . Hilar adenopathy 02/20/2018  . Abnormal findings on diagnostic imaging of lung 02/20/2018  . Platelet inhibition due to Plavix 02/20/2018  . PVD (peripheral vascular disease) ([place]) 10/15/2013  . Tobacco use disorder 10/15/2013  . Pure hypercholesterolemia 10/15/2013  . Coronary artery disease involving native coronary artery of native heart without angina pectoris 10/15/2013  . Cataract 09/29/2013  . Colon cancer screening 09/25/2013  . AAA (abdominal aortic aneurysm) without rupture ([place]) 10/22/2012  . Nicotine addiction 10/17/2012  . Atherosclerosis of native arteries of the extremities with ulceration(440.23) 10/17/2012  . Carotid bruit 10/17/2012  . Other nonspecific abnormal cardiovascular system  function study 04/10/2012  . History of sciatica   . Essential hypertension, benign   . Seasonal allergies     Past Surgical History:  Procedure Laterality Date  . CARDIAC CATHETERIZATION     stent placement  . COLONOSCOPY W/ BIOPSIES AND POLYPECTOMY    . ILIAC ARTERY STENT    . IR IMAGING GUIDED PORT INSERTION  03/23/2019  . MULTIPLE TOOTH EXTRACTIONS    . VIDEO BRONCHOSCOPY WITH ENDOBRONCHIAL NAVIGATION N/A 02/26/2018   Procedure: VIDEO BRONCHOSCOPY WITH ENDOBRONCHIAL NAVIGATION;  Surgeon: Garner Nash, DO;  Location: [facility];  Service: Thoracic;  Laterality: N/A;  . VIDEO BRONCHOSCOPY WITH ENDOBRONCHIAL NAVIGATION N/A 03/19/2018   Procedure: VIDEO BRONCHOSCOPY WITH ENDOBRONCHIAL NAVIGATION and endobronchial ultrasound;  Surgeon: Garner Nash, DO;  Location: [facility];  Service: Thoracic;  Laterality: N/A;  . VIDEO BRONCHOSCOPY WITH ENDOBRONCHIAL ULTRASOUND N/A 02/26/2018   Procedure: VIDEO BRONCHOSCOPY WITH ENDOBRONCHIAL ULTRASOUND;  Surgeon: Garner Nash, DO;  Location: [facility];  Service: Thoracic;  Laterality: N/A;  . VIDEO BRONCHOSCOPY WITH ENDOBRONCHIAL ULTRASOUND N/A 03/19/2018   Procedure: NAVIGATION BRONCHOSCOPY;  Surgeon: Garner Nash, DO;  Location: [facility];  Service: Thoracic;  Laterality: N/A;       Family History  Problem Relation Age of Onset  . Hypertension Mother   . Heart disease Mother   . Stroke Mother   . Heart disease Paternal Grandmother   . Diabetes Paternal Grandmother   . Hyperlipidemia Paternal Grandmother   . Diabetes Sister   . Heart disease Sister   . Hyperlipidemia Sister   . Heart disease Brother   . Hyperlipidemia Brother   . Hypertension Brother   . Diabetes Paternal Grandfather   . Heart disease Paternal Grandfather   . Hyperlipidemia Paternal Grandfather   . Heart disease Maternal Uncle        x 2  . Cancer Maternal Grandmother        type unknown, ? lung  . Leukemia Maternal Uncle     Social History   Tobacco Use  . Smoking  status: Former Smoker    Packs/day: 3.00    Years: 50.00    Pack years: 150.00    Types: Cigarettes    Quit date: 04/05/2016    Years since quitting: 3.0  . Smokeless tobacco: Never Used  Substance Use Topics  . Alcohol use: Yes    Alcohol/week: 0.0 standard drinks    Comment: rare  . Drug use: No    Home Medications Prior to Admission medications   Medication Sig Start Date End Date Taking? Authorizing Provider  atorvastatin (LIPITOR) 40 MG tablet Take 1 tablet (40 mg total) by mouth daily. 04/09/18 04/18/19 Yes Turner, Eber Hong, MD  cetirizine (ZYRTEC) 10 MG tablet Take 10 mg by mouth daily at 12 noon.   Yes [provider]  clopidogrel (PLAVIX)  75 MG tablet TAKE 1 TABLET BY MOUTH EVERY DAY Patient taking differently: Take 75 mg by mouth daily.  02/19/19  Yes [name], MD  famotidine (PEPCID) 20 MG tablet Take 20 mg by mouth 2 (two) times daily.    Yes [provider]  folic acid (FOLVITE) 1 MG tablet Take 1 tablet (1 mg total) by mouth daily. 02/03/19  Yes Curt Bears, MD  hydroxypropyl methylcellulose / hypromellose (ISOPTO TEARS / GONIOVISC) 2.5 % ophthalmic solution Place 1 drop into both eyes 3 (three) times daily.   Yes [provider]  metoprolol tartrate (LOPRESSOR) 25 MG tablet Take 1 tablet (25 mg total) by mouth 2 (two) times daily. Only take if systolic blood pressure is >110 Patient taking differently: Take 25 mg by mouth 2 (two) times daily as needed. Only take if systolic blood pressure is >110 04/06/19  Yes Turner, Eber Hong, MD  sodium chloride (OCEAN) 0.65 % SOLN nasal spray Place 1 spray into both nostrils 2 (two) times daily as needed for congestion.   Yes [provider]  lidocaine-prilocaine (EMLA) cream Apply to Port-A-Cath site 30 minutes before infusion. Patient taking differently: Apply 1 application topically daily as needed. Apply to [facility] for pain -A-Cath site 30 minutes before infusion. 03/10/19   Curt Bears, MD    magic mouthwash SOLN Take 5 mLs by mouth 4 (four) times daily as needed for mouth pain. 04/03/19   Heilingoetter, Cassandra L, PA-C  prochlorperazine (COMPAZINE) 10 MG tablet Take 1 tablet (10 mg total) by mouth every 6 (six) hours as needed for nausea or vomiting. 02/03/19   Curt Bears, MD    Allergies    Biaxin [clarithromycin], Clarithromycin, Iodinated diagnostic agents, Prednisone, Cortisone, Flomax [tamsulosin hcl], Hepatitis b virus vaccines, and Iodine  Review of Systems   Review of Systems  Constitutional: Negative for chills and fever.  Respiratory: Negative for cough and shortness of breath.   Cardiovascular: Negative for chest pain and palpitations.  Gastrointestinal: Negative for abdominal pain, nausea and vomiting.  Skin: Negative for color change and rash.  Neurological: Positive for dizziness and light-headedness. Negative for syncope and headaches.  Psychiatric/Behavioral: Negative for agitation and confusion.  All other systems reviewed and are negative.   Physical Exam Updated Vital Signs BP 140/81 (BP Location: Right Arm)   Pulse (!) 107   Temp 98.3 F (36.8 C) (Oral)   Resp 18   Ht 6\' 1"  (1.854 m)   Wt 79.9 kg   SpO2 100%   BMI 23.25 kg/m   Physical Exam Vitals and nursing note reviewed.  Constitutional:      Appearance: He is well-developed.  HENT:     Head: Normocephalic and atraumatic.  Eyes:     Conjunctiva/sclera: Conjunctivae normal.  Cardiovascular:     Rate and Rhythm: Regular rhythm. Tachycardia present.     Pulses: Normal pulses.     Comments: Hr 105 bpm, regular on telemetry monitor Pulmonary:     Effort: Pulmonary effort is normal. No respiratory distress.     Breath sounds: Normal breath sounds.  Abdominal:     General: There is no distension.     Palpations: Abdomen is soft. There is no pulsatile mass.     Tenderness: There is no abdominal tenderness. There is no guarding or rebound.  Musculoskeletal:     Cervical back:  Neck supple.  Skin:    General: Skin is warm and dry.  Neurological:     General: No focal deficit present.  Mental Status: He is alert and oriented to person, place, and time.  Psychiatric:        Mood and Affect: Mood normal.        Behavior: Behavior normal.     ED Results / Procedures / Treatments   Labs (all labs ordered are listed, but only abnormal results are displayed) Labs Reviewed  BASIC METABOLIC PANEL - Abnormal; Notable for the following components:      Result Value   Sodium 133 (*)    Potassium 3.3 (*)    Glucose, Bld 120 (*)    Calcium 8.7 (*)    All other components within normal limits  CBC WITH DIFFERENTIAL/PLATELET - Abnormal; Notable for the following components:   RBC 2.87 (*)    Hemoglobin 8.4 (*)    HCT 25.8 (*)    RDW 17.6 (*)    Platelets 123 (*)    Lymphs Abs 0.4 (*)    All other components within normal limits  MAGNESIUM - Abnormal; Notable for the following components:   Magnesium 1.4 (*)    All other components within normal limits  D-DIMER, QUANTITATIVE (NOT AT [facility]) - Abnormal; Notable for the following components:   D-Dimer, Quant 2.09 (*)    All other components within normal limits  TROPONIN I (HIGH SENSITIVITY) - Abnormal; Notable for the following components:   Troponin I (High Sensitivity) 108 (*)    All other components within normal limits  TROPONIN I (HIGH SENSITIVITY) - Abnormal; Notable for the following components:   Troponin I (High Sensitivity) 109 (*)    All other components within normal limits  SARS CORONAVIRUS 2 (TAT 6-24 HRS)  CBC  CORTISOL-AM, BLOOD  HEPARIN LEVEL (UNFRACTIONATED)    EKG EKG Interpretation  Date/Time:  Saturday April 18 2019 15:38:53 EST Ventricular Rate:  99 PR Interval:    QRS Duration: 98 QT Interval:  347 QTC Calculation: 446 R Axis:   35 Text Interpretation: Sinus rhythm Low voltage, precordial leads Abnormal R-wave progression, early transition No STEMI Confirmed by Octaviano Glow [id number]) on 04/18/2019 4:04:22 PM   Radiology CT Abdomen Pelvis Wo Contrast  Result Date: 04/17/2019 CLINICAL DATA:  Non-small cell left upper lobe lung cancer initially treated with chemotherapy and radiation therapy completed May 2020, with recurrent metastatic disease to the bones and adrenal glands. Restaging. EXAM: CT CHEST, ABDOMEN AND PELVIS WITHOUT CONTRAST TECHNIQUE: Multidetector CT imaging of the chest, abdomen and pelvis was performed following the standard protocol without IV contrast. COMPARISON:  01/22/2019 PET-CT. 01/13/2019 CT chest, abdomen and pelvis. FINDINGS: CT CHEST FINDINGS Cardiovascular: Normal heart size. No significant pericardial effusion/thickening. Three-vessel coronary atherosclerosis. Right internal jugular Port-A-Cath terminates at the cavoatrial junction. Atherosclerotic nonaneurysmal thoracic aorta. Normal caliber pulmonary arteries. Mediastinum/Nodes: No discrete thyroid nodules. Unremarkable esophagus. No axillary adenopathy. Newly enlarged 1.1 cm AP window node (series 2/image 27). No additional pathologically enlarged mediastinal nodes. No discrete hilar adenopathy on these noncontrast images. Lungs/Pleura: No pneumothorax. No pleural effusion. Moderate centrilobular and paraseptal emphysema with mild diffuse bronchial wall thickening. Numerous (greater than 20) small solid pulmonary nodules scattered throughout both lungs, increased in size and number. For example a 5 mm posterior right upper lobe nodule (series 6/image 61), previously 2 mm on 01/13/2019 CT. Right middle lobe 6 mm nodule (series 6/image 86), increased from 2 mm. Left lower lobe 4 mm nodule (series 6/image 98), increased from 1 mm. Sharply marginated left parahilar consolidation with mild volume loss and distortion, compatible with radiation change. Musculoskeletal: Left  posterior element T9 and less discrete T5 sclerotic osseous lesions, increased in sclerosis (series 5/images 88 and 89). Mild  thoracic spondylosis. CT ABDOMEN PELVIS FINDINGS Hepatobiliary: Normal liver with no liver mass. Normal gallbladder with no radiopaque cholelithiasis. No biliary ductal dilatation. Pancreas: Normal, with no mass or duct dilation. Spleen: Normal size. No mass. Adrenals/Urinary Tract: Right adrenal 3.6 cm mass, previously 3.4 cm, slightly increased. Left adrenal 3.3 cm mass, previously 2.8 cm, slightly increased. No hydronephrosis. No renal stones. No contour deforming renal masses. Normal bladder. Stomach/Bowel: Normal non-distended stomach. Normal caliber small bowel with no small bowel wall thickening. Normal appendix. Oral contrast transits to the colon. Normal large bowel with no diverticulosis, large bowel wall thickening or pericolonic fat stranding. Vascular/Lymphatic: Atherosclerotic abdominal aorta with 4.8 cm infrarenal abdominal aortic aneurysm, stable using similar measurement technique. No pathologically enlarged lymph nodes in the abdomen or pelvis. Reproductive: Mildly enlarged prostate. Other: No pneumoperitoneum, ascites or focal fluid collection. Musculoskeletal: Predominantly lytic posterior left acetabular 3.1 cm lesion (series 2/image 120), increased from 1.7 cm. Chronic bilateral L5 pars defects with severe degenerative disc disease and 10 mm anterolisthesis at L5-S1. IMPRESSION: 1. Numerous small solid pulmonary nodules scattered throughout both lungs, increased in size and number, compatible with progressive pulmonary metastases. 2. New mild AP window adenopathy, suspicious for nodal metastasis. 3. Bilateral adrenal metastases are slightly increased in size. 4. Predominantly lytic posterior left acetabular 3.1 cm metastasis is increased in size. Increased sclerosis/conspicuity of left posterior element T9 and T5 sclerotic osseous lesions. Findings are compatible with progressive osseous metastases. 5. Stable 4.8 cm infrarenal abdominal aortic aneurysm. Abdominal Aortic Aneurysm (ICD10-I71.9).  Recommend follow-up CTA of the abdomen and pelvis in 6 months and vascular surgery referral/consultation. This recommendation follows ACR consensus guidelines: White Paper of the ACR Incidental Findings Committee II on Vascular Findings. J Am Coll Radiol 2013; 10:789-794. 6. Aortic Atherosclerosis (ICD10-I70.0). Additional chronic findings as detailed. Electronically Signed   By: Ilona Sorrel M.D.   On: 04/17/2019 15:53   CT Chest Wo Contrast  Result Date: 04/17/2019 CLINICAL DATA:  Non-small cell left upper lobe lung cancer initially treated with chemotherapy and radiation therapy completed May 2020, with recurrent metastatic disease to the bones and adrenal glands. Restaging. EXAM: CT CHEST, ABDOMEN AND PELVIS WITHOUT CONTRAST TECHNIQUE: Multidetector CT imaging of the chest, abdomen and pelvis was performed following the standard protocol without IV contrast. COMPARISON:  01/22/2019 PET-CT. 01/13/2019 CT chest, abdomen and pelvis. FINDINGS: CT CHEST FINDINGS Cardiovascular: Normal heart size. No significant pericardial effusion/thickening. Three-vessel coronary atherosclerosis. Right internal jugular Port-A-Cath terminates at the cavoatrial junction. Atherosclerotic nonaneurysmal thoracic aorta. Normal caliber pulmonary arteries. Mediastinum/Nodes: No discrete thyroid nodules. Unremarkable esophagus. No axillary adenopathy. Newly enlarged 1.1 cm AP window node (series 2/image 27). No additional pathologically enlarged mediastinal nodes. No discrete hilar adenopathy on these noncontrast images. Lungs/Pleura: No pneumothorax. No pleural effusion. Moderate centrilobular and paraseptal emphysema with mild diffuse bronchial wall thickening. Numerous (greater than 20) small solid pulmonary nodules scattered throughout both lungs, increased in size and number. For example a 5 mm posterior right upper lobe nodule (series 6/image 61), previously 2 mm on 01/13/2019 CT. Right middle lobe 6 mm nodule (series 6/image 86),  increased from 2 mm. Left lower lobe 4 mm nodule (series 6/image 98), increased from 1 mm. Sharply marginated left parahilar consolidation with mild volume loss and distortion, compatible with radiation change. Musculoskeletal: Left posterior element T9 and less discrete T5 sclerotic osseous lesions, increased in sclerosis (series 5/images 88  and 89). Mild thoracic spondylosis. CT ABDOMEN PELVIS FINDINGS Hepatobiliary: Normal liver with no liver mass. Normal gallbladder with no radiopaque cholelithiasis. No biliary ductal dilatation. Pancreas: Normal, with no mass or duct dilation. Spleen: Normal size. No mass. Adrenals/Urinary Tract: Right adrenal 3.6 cm mass, previously 3.4 cm, slightly increased. Left adrenal 3.3 cm mass, previously 2.8 cm, slightly increased. No hydronephrosis. No renal stones. No contour deforming renal masses. Normal bladder. Stomach/Bowel: Normal non-distended stomach. Normal caliber small bowel with no small bowel wall thickening. Normal appendix. Oral contrast transits to the colon. Normal large bowel with no diverticulosis, large bowel wall thickening or pericolonic fat stranding. Vascular/Lymphatic: Atherosclerotic abdominal aorta with 4.8 cm infrarenal abdominal aortic aneurysm, stable using similar measurement technique. No pathologically enlarged lymph nodes in the abdomen or pelvis. Reproductive: Mildly enlarged prostate. Other: No pneumoperitoneum, ascites or focal fluid collection. Musculoskeletal: Predominantly lytic posterior left acetabular 3.1 cm lesion (series 2/image 120), increased from 1.7 cm. Chronic bilateral L5 pars defects with severe degenerative disc disease and 10 mm anterolisthesis at L5-S1. IMPRESSION: 1. Numerous small solid pulmonary nodules scattered throughout both lungs, increased in size and number, compatible with progressive pulmonary metastases. 2. New mild AP window adenopathy, suspicious for nodal metastasis. 3. Bilateral adrenal metastases are slightly  increased in size. 4. Predominantly lytic posterior left acetabular 3.1 cm metastasis is increased in size. Increased sclerosis/conspicuity of left posterior element T9 and T5 sclerotic osseous lesions. Findings are compatible with progressive osseous metastases. 5. Stable 4.8 cm infrarenal abdominal aortic aneurysm. Abdominal Aortic Aneurysm (ICD10-I71.9). Recommend follow-up CTA of the abdomen and pelvis in 6 months and vascular surgery referral/consultation. This recommendation follows ACR consensus guidelines: White Paper of the ACR Incidental Findings Committee II on Vascular Findings. J Am Coll Radiol 2013; 10:789-794. 6. Aortic Atherosclerosis (ICD10-I70.0). Additional chronic findings as detailed. Electronically Signed   By: Ilona Sorrel M.D.   On: 04/17/2019 15:53   DG Chest Portable 1 View  Result Date: 04/18/2019 CLINICAL DATA:  Hypotension. Near syncope. Dizziness. EXAM: PORTABLE CHEST 1 VIEW COMPARISON:  Chest CT yesterday. FINDINGS: Right chest port remains in place. Chronic hyperinflation with emphysema. Volume loss in the left hemithorax with left perihilar opacities, stable from CT yesterday. Heart is normal in size. Aortic atherosclerosis. No acute airspace disease, pulmonary edema, pneumothorax or large pleural effusion. Pulmonary nodules on CT yesterday are not well demonstrated radiographically no acute osseous abnormalities are seen. Enteric contrast in the splenic flexure of the colon from yesterday's CT. IMPRESSION: 1. No acute findings. 2. Sequela of left lung cancer with volume loss in the left hemithorax and perihilar opacities, assessed on CT yesterday. 3. Hyperinflation and emphysema.  Aortic atherosclerosis. Aortic Atherosclerosis (ICD10-I70.0) and Emphysema (ICD10-J43.9). Electronically Signed   By: Keith Rake M.D.   On: 04/18/2019 16:05    Procedures .Critical Care Performed by: Wyvonnia Dusky, MD Authorized by: Wyvonnia Dusky, MD   Critical care provider  statement:    Critical care time (minutes):  42   Critical care was necessary to treat or prevent imminent or life-threatening deterioration of the following conditions:  Circulatory failure   Critical care was time spent personally by me on the following activities:  Discussions with consultants, evaluation of patient's response to treatment, examination of patient, ordering and performing treatments and interventions, ordering and review of laboratory studies, ordering and review of radiographic studies, pulse oximetry, re-evaluation of patient's condition, obtaining history from patient or surrogate and review of old charts Comments:     Elevated  troponins, suspicion for PE, requiring IV heparin infusion   (including critical care time)  Medications Ordered in ED Medications  heparin ADULT infusion 100 units/mL (25000 units/259mL sodium chloride 0.45%) (1,400 Units/hr Intravenous New Bag/Given 04/18/19 2200)  atorvastatin (LIPITOR) tablet 40 mg (has no administration in time range)  metoprolol tartrate (LOPRESSOR) tablet 25 mg (25 mg Oral Given 04/18/19 2129)  prochlorperazine (COMPAZINE) tablet 10 mg (has no administration in time range)  famotidine (PEPCID) tablet 20 mg (20 mg Oral Given 04/18/19 2129)  magic mouthwash (has no administration in time range)  dextrose 5 %-0.45 % sodium chloride infusion ( Intravenous New Bag/Given 04/18/19 2131)  acetaminophen (TYLENOL) tablet 650 mg (has no administration in time range)    Or  acetaminophen (TYLENOL) suppository 650 mg (has no administration in time range)  ondansetron (ZOFRAN) tablet 4 mg (has no administration in time range)    Or  ondansetron (ZOFRAN) injection 4 mg (has no administration in time range)  potassium chloride SA (KLOR-CON) CR tablet 20 mEq (20 mEq Oral Given 04/18/19 2201)  sodium chloride 0.9 % bolus 1,000 mL (0 mLs Intravenous Stopped 04/18/19 1928)  heparin bolus via infusion 2,500 Units (2,500 Units Intravenous Bolus from  Bag 04/18/19 2158)    ED Course  I have reviewed the triage vital signs and the nursing notes.  Pertinent labs & imaging results that were available during my care of the patient were reviewed by me and considered in my medical decision making (see chart for details).  74 year old male with a history of non-small cell carcinoma of the lung, on chemotherapy, also with an apparent tachydysrhythmia (unclear which) for which he has been on Lopressor 25 mg BID, presented to the ED with hypotension and lightheadedness earlier today.  He did not take his Lopressor today or yesterday.  Otherwise has been taking it this week.  He holds it if his blood pressure drops 623 systolic.  Here in the ED he does have some minor tachycardia with a heart rate in the low 100s.  He reports during his lightheadedness episode his heart rate was up in the 140s.  It is unclear if he may be on his A. fib at that time, if it was simply compensatory sinus tachycardia.  Hypotension has been an ongoing issue since he began chemo.  He says he can drop as low as the 76'E systolic, but never went into the 80's.   This may be due to dehydration from chemo side effects.  We'll check his orthostatic VS here, as well as BMP, Cr, and give him some fluids.  We'll also check electrolytes, ECG tracing, and troponin.  Low suspicion for PE with his symptoms being intermittent, and no respiratory complaints.  Here in the ED he is NOT hypotensive.  Low suspicion for worsening AAA with no abdominal pain, no persistent hypotension or symptoms.  His last CT scan was yesterday showing 4.8 cm infrarenal AAA, stable from prior imaging.   Clinical Course as of Apr 17 2236  Sat Apr 18, 2019  1529 Saw patient, BP 831 systolic when I was in room   [MT]  1729 Elevated troponin, no chest pain, will discuss with cardiology   [MT]  1810 No CP, BP stable, asymptomatic   [MT]  1843 I spoke to Dr. Rhae Hammock of cardiology, reviewed labs and ECG.   Explained my suspicion at this needs to be likely a PE rule out, less likely ACS with no dynamic changes in the troponin, no chest pain,  no ischemic findings on the EKG.  Unfortunately patient is iodine contrast allergies (hives), and will either need full iodine prep, or else a VQ scan.  I'll discuss next steps with the hospitalist here, including initiating anticoagulation    [MT]    Clinical Course User Index [MT] Tiffany Calmes, Carola Rhine, MD    Final Clinical Impression(s) / ED Diagnoses Final diagnoses:  Hypotension, unspecified hypotension type  Elevated troponin    Rx / DC Orders ED Discharge Orders    None       Kimberely Mccannon, Carola Rhine, MD 04/18/19 2236    Wyvonnia Dusky, MD 04/18/19 2238

## 2019-04-18 NOTE — ED Triage Notes (Signed)
Per patient , he is hypotensive and is feeling dizzy since this morning. Patient says he was instructed to come to ED if these symptoms occur.

## 2019-04-18 NOTE — H&P (Signed)
History and Physical    Seth [name] VPX:[id number] DOB: 11/17/1945 DOA: 04/18/2019  PCP: [name], MD (Confirm with patient/family/NH records and if not entered, this has to be entered at [facility] point of entry) Patient coming from: home  I have personally briefly reviewed patient's old medical records in [place]  Chief Complaint: Hypotension  HPI: Seth [name] is a 74 y.o. male with medical history significant of CAD, emphysema and stg 4 NSCCL undergoing chemotherapy. For severalweeks he has had intermittent episodes of hypotension and feelings of being light-headed. He had recurrent symptoms today and presented to [facility] for evaluation. He denied any chest pain, increased SOB/DOE, fever or other symptoms (For level 3, the HPI must include 4+ descriptors: Location, Quality, Severity, Duration, Timing, Context, modifying factors, associated signs/symptoms and/or status of 3+ chronic problems.)  (Please avoid self-populating past medical history here) (The initial 2-3 lines should be focused and good to copy and paste in the HPI section of the daily progress note).  ED Course: Hemodynamically stable with noral BP. Lab work revealed Hgb 8.4, (down from 9.0 04/13/19), glucose 120, Troponin #1 108, Troponin #2 109, EKG normal. He saw Dr. Radford Pax for Cardiology 3/1 and was stable. He saw Dr. Nyoka Cowden, PCP, 3/10 and was stable. He is referred to [facility] for admission due to elevated troponin levels. There is also a concern for potential chronic PEs in lightof his cancer diagnosis and symptoms.  Review of Systems: As per HPI otherwise 10 point review of systems negative.  Unacceptable ROS statements: "10 systems reviewed," "Extensive" (without elaboration).  Acceptable ROS statements: "All others negative," "All others reviewed and are negative," and "All others unremarkable," with at [place] documented Can't double dip - if using for HPI can't use for ROS  Past Medical History:  Diagnosis  Date  . AAA (abdominal aortic aneurysm) ([place])   . Allergic rhinitis   . Anxiety   . Back pain   . CAD (coronary artery disease)   . Cataracts, bilateral   . Emphysema, unspecified ([place])     " moderate"  . Essential hypertension, benign   . GERD (gastroesophageal reflux disease)   . Gout   . Headache    migraines  . Hilar adenopathy   . History of hiatal hernia   . History of sciatica   . Hyperlipidemia   . lung ca dx'd 02/2018  . Lung nodule   . Pneumonia    as child  . Prostate hypertrophy   . PVD (peripheral vascular disease) ([place])   . Seasonal allergies   . Subclavian steal syndrome of left subclavian artery   . Wears dentures     Past Surgical History:  Procedure Laterality Date  . CARDIAC CATHETERIZATION     stent placement  . COLONOSCOPY W/ BIOPSIES AND POLYPECTOMY    . ILIAC ARTERY STENT    . IR IMAGING GUIDED PORT INSERTION  03/23/2019  . MULTIPLE TOOTH EXTRACTIONS    . VIDEO BRONCHOSCOPY WITH ENDOBRONCHIAL NAVIGATION N/A 02/26/2018   Procedure: VIDEO BRONCHOSCOPY WITH ENDOBRONCHIAL NAVIGATION;  Surgeon: Garner Nash, DO;  Location: [facility];  Service: Thoracic;  Laterality: N/A;  . VIDEO BRONCHOSCOPY WITH ENDOBRONCHIAL NAVIGATION N/A 03/19/2018   Procedure: VIDEO BRONCHOSCOPY WITH ENDOBRONCHIAL NAVIGATION and endobronchial ultrasound;  Surgeon: Garner Nash, DO;  Location: [facility];  Service: Thoracic;  Laterality: N/A;  . VIDEO BRONCHOSCOPY WITH ENDOBRONCHIAL ULTRASOUND N/A 02/26/2018   Procedure: VIDEO BRONCHOSCOPY WITH ENDOBRONCHIAL ULTRASOUND;  Surgeon: June Leap  L, DO;  Location: [facility];  Service: Thoracic;  Laterality: N/A;  . VIDEO BRONCHOSCOPY WITH ENDOBRONCHIAL ULTRASOUND N/A 03/19/2018   Procedure: NAVIGATION BRONCHOSCOPY;  Surgeon: Garner Nash, DO;  Location: [facility];  Service: Thoracic;  Laterality: N/A;   Soc Hx  - married 61 years, 1 son. 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     reports that he quit smoking about 3 years ago. His smoking use included cigarettes. He has a 150.00 pack-year smoking history. He has never used smokeless tobacco. He reports current alcohol use. He reports that he does not use drugs.  Allergies  Allergen Reactions  . Biaxin [Clarithromycin] Shortness Of Breath  . Clarithromycin Shortness Of Breath  . Iodinated Diagnostic Agents Hives  . Prednisone Other (See Comments)    Patient states he turns REALLY RED all over.  . Cortisone Other (See Comments)    Turns red from breast up  . Flomax [Tamsulosin Hcl]     Blurred vision and lower back pain   . Hepatitis B Virus Vaccines     Nerve problems States his previous doctor told him it was due to this   . Iodine Hives    Dye from nuclear medicine test    Family History  Problem Relation Age of Onset  . Hypertension Mother   . Heart disease Mother   . Stroke Mother   . Heart disease Paternal Grandmother   . Diabetes Paternal Grandmother   . Hyperlipidemia Paternal Grandmother   . Diabetes Sister   . Heart disease Sister   . Hyperlipidemia Sister   . Heart disease Brother   . Hyperlipidemia Brother   . Hypertension Brother   . Diabetes Paternal Grandfather   . Heart disease Paternal Grandfather   . Hyperlipidemia Paternal Grandfather   . Heart disease Maternal Uncle        x 2  . Cancer Maternal Grandmother        type unknown, ? lung  . Leukemia Maternal Uncle    Unacceptable: Noncontributory, unremarkable, or negative. Acceptable: Family history reviewed and not pertinent (If you reviewed it)  Prior to Admission medications   Medication Sig Start Date End Date Taking? Authorizing Provider  atorvastatin (LIPITOR) 40 MG tablet Take 1 tablet (40 mg total) by mouth daily. 04/09/18 04/18/19 Yes Turner, Eber Hong, MD  cetirizine (ZYRTEC) 10 MG tablet Take 10 mg by mouth daily at 12 noon.   Yes [provider]  clopidogrel (PLAVIX) 75 MG tablet TAKE 1 TABLET BY MOUTH EVERY  DAY Patient taking differently: Take 75 mg by mouth daily.  02/19/19  Yes [name], MD  famotidine (PEPCID) 20 MG tablet Take 20 mg by mouth 2 (two) times daily.    Yes [provider]  folic acid (FOLVITE) 1 MG tablet Take 1 tablet (1 mg total) by mouth daily. 02/03/19  Yes Curt Bears, MD  hydroxypropyl methylcellulose / hypromellose (ISOPTO TEARS / GONIOVISC) 2.5 % ophthalmic solution Place 1 drop into both eyes 3 (three) times daily.   Yes [provider]  metoprolol tartrate (LOPRESSOR) 25 MG tablet Take 1 tablet (25 mg total) by mouth 2 (two) times daily. Only take if systolic blood pressure is >110 Patient taking differently: Take 25 mg by mouth 2 (two) times daily as needed. Only take if systolic blood pressure is >110 04/06/19  Yes Turner, Traci R, MD  sodium chloride (OCEAN) 0.65 % SOLN  nasal spray Place 1 spray into both nostrils 2 (two) times daily as needed for congestion.   Yes [provider]  lidocaine-prilocaine (EMLA) cream Apply to Port-A-Cath site 30 minutes before infusion. Patient taking differently: Apply 1 application topically daily as needed. Apply to [facility] for pain -A-Cath site 30 minutes before infusion. 03/10/19   Curt Bears, MD  magic mouthwash SOLN Take 5 mLs by mouth 4 (four) times daily as needed for mouth pain. 04/03/19   Heilingoetter, Cassandra L, PA-C  prochlorperazine (COMPAZINE) 10 MG tablet Take 1 tablet (10 mg total) by mouth every 6 (six) hours as needed for nausea or vomiting. 02/03/19   Curt Bears, MD    Physical Exam: Vitals:   04/18/19 1900 04/18/19 1930 04/18/19 2000 04/18/19 2030  BP: 132/80 140/89 127/83 129/69  Pulse: 93 (!) 109 (!) 104 (!) 102  Resp: 20 19 17  (!) 21  Temp:      TempSrc:      SpO2: 100% 99% 99% 97%  Weight:      Height:        Constitutional: NAD, calm, comfortable Vitals:   04/18/19 1900 04/18/19 1930 04/18/19 2000 04/18/19 2030  BP: 132/80 140/89 127/83 129/69  Pulse: 93  (!) 109 (!) 104 (!) 102  Resp: 20 19 17  (!) 21  Temp:      TempSrc:      SpO2: 100% 99% 99% 97%  Weight:      Height:       General:  WNWD man in no distress Eyes: PERRL, lids and conjunctivae normal. Some clear drainage left eye at medial  ENMT: Mucous membranes are moist. Posterior pharynx clear of any exudate or lesions.Edentulous.  Neck: normal, supple, no masses, no thyromegaly Respiratory: clear to auscultation bilaterally, no wheezing, no crackles. Normal respiratory effort. No accessory muscle use.  Cardiovascular: Regular rate and rhythm, no murmurs / rubs / gallops. No extremity edema. 2+ pedal pulses. No carotid bruits.  Abdomen: no tenderness, no masses palpated. No hepatosplenomegaly. Bowel sounds positive.  Musculoskeletal: no clubbing / cyanosis. No joint deformity upper and lower extremities. Good ROM, no contractures. Normal muscle tone.  Skin: no rashes, lesions, ulcers. No induration. Bruising on both arms Neurologic: CN 2-12 grossly intact. Sensation intact, DTR normal. Strength 5/5 in all 4.  Psychiatric: Normal judgment and insight. Alert and oriented x 3. Normal mood.   (Anything < 9 systems with 2 bullets each down codes to level 1) (If patient refuses exam can't bill higher level) (Make sure to document decubitus ulcers present on admission -- if possible -- and whether patient has chronic indwelling catheter at time of admission)  Labs on Admission: I have personally reviewed following labs and imaging studies  CBC: Recent Labs  Lab 04/14/19 1227 04/18/19 1531  WBC 3.2* 4.5  NEUTROABS 2.0 3.3  HGB 9.0* 8.4*  HCT 27.0* 25.8*  MCV 89.1 89.9  PLT 69* 161*   Basic Metabolic Panel: Recent Labs  Lab 04/14/19 1227 04/18/19 1531  NA 137 133*  K 3.3* 3.3*  CL 100 98  CO2 25 24  GLUCOSE 109* 120*  BUN 12 14  CREATININE 0.99 0.94  CALCIUM 8.5* 8.7*  MG  --  1.4*   GFR: Estimated Creatinine Clearance: 79.1 mL/min (by C-G formula based on SCr of 0.94  mg/dL). Liver Function Tests: Recent Labs  Lab 04/14/19 1227  AST 25  ALT 21  ALKPHOS 139*  BILITOT 0.9  PROT 7.0  ALBUMIN 3.6   No  results for input(s): LIPASE, AMYLASE in the last 168 hours. No results for input(s): AMMONIA in the last 168 hours. Coagulation Profile: No results for input(s): INR, PROTIME in the last 168 hours. Cardiac Enzymes: No results for input(s): CKTOTAL, CKMB, CKMBINDEX, TROPONINI in the last 168 hours. BNP (last 3 results) No results for input(s): PROBNP in the last 8760 hours. HbA1C: No results for input(s): HGBA1C in the last 72 hours. CBG: No results for input(s): GLUCAP in the last 168 hours. Lipid Profile: No results for input(s): CHOL, HDL, LDLCALC, TRIG, CHOLHDL, LDLDIRECT in the last 72 hours. Thyroid Function Tests: No results for input(s): TSH, T4TOTAL, FREET4, T3FREE, THYROIDAB in the last 72 hours. Anemia Panel: No results for input(s): VITAMINB12, FOLATE, FERRITIN, TIBC, IRON, RETICCTPCT in the last 72 hours. Urine analysis:    Component Value Date/Time   BILIRUBINUR negative 04/25/2017 1349   BILIRUBINUR neg 09/07/2013 1549   KETONESUR negative 04/25/2017 1349   PROTEINUR negative 04/25/2017 1349   PROTEINUR neg 09/07/2013 1549   UROBILINOGEN 0.2 04/25/2017 1349   NITRITE Negative 04/25/2017 1349   NITRITE neg 09/07/2013 1549   LEUKOCYTESUR Negative 04/25/2017 1349    Radiological Exams on Admission: CT Abdomen Pelvis Wo Contrast  Result Date: 04/17/2019 CLINICAL DATA:  Non-small cell left upper lobe lung cancer initially treated with chemotherapy and radiation therapy completed May 2020, with recurrent metastatic disease to the bones and adrenal glands. Restaging. EXAM: CT CHEST, ABDOMEN AND PELVIS WITHOUT CONTRAST TECHNIQUE: Multidetector CT imaging of the chest, abdomen and pelvis was performed following the standard protocol without IV contrast. COMPARISON:  01/22/2019 PET-CT. 01/13/2019 CT chest, abdomen and pelvis. FINDINGS:  CT CHEST FINDINGS Cardiovascular: Normal heart size. No significant pericardial effusion/thickening. Three-vessel coronary atherosclerosis. Right internal jugular Port-A-Cath terminates at the cavoatrial junction. Atherosclerotic nonaneurysmal thoracic aorta. Normal caliber pulmonary arteries. Mediastinum/Nodes: No discrete thyroid nodules. Unremarkable esophagus. No axillary adenopathy. Newly enlarged 1.1 cm AP window node (series 2/image 27). No additional pathologically enlarged mediastinal nodes. No discrete hilar adenopathy on these noncontrast images. Lungs/Pleura: No pneumothorax. No pleural effusion. Moderate centrilobular and paraseptal emphysema with mild diffuse bronchial wall thickening. Numerous (greater than 20) small solid pulmonary nodules scattered throughout both lungs, increased in size and number. For example a 5 mm posterior right upper lobe nodule (series 6/image 61), previously 2 mm on 01/13/2019 CT. Right middle lobe 6 mm nodule (series 6/image 86), increased from 2 mm. Left lower lobe 4 mm nodule (series 6/image 98), increased from 1 mm. Sharply marginated left parahilar consolidation with mild volume loss and distortion, compatible with radiation change. Musculoskeletal: Left posterior element T9 and less discrete T5 sclerotic osseous lesions, increased in sclerosis (series 5/images 88 and 89). Mild thoracic spondylosis. CT ABDOMEN PELVIS FINDINGS Hepatobiliary: Normal liver with no liver mass. Normal gallbladder with no radiopaque cholelithiasis. No biliary ductal dilatation. Pancreas: Normal, with no mass or duct dilation. Spleen: Normal size. No mass. Adrenals/Urinary Tract: Right adrenal 3.6 cm mass, previously 3.4 cm, slightly increased. Left adrenal 3.3 cm mass, previously 2.8 cm, slightly increased. No hydronephrosis. No renal stones. No contour deforming renal masses. Normal bladder. Stomach/Bowel: Normal non-distended stomach. Normal caliber small bowel with no small bowel wall  thickening. Normal appendix. Oral contrast transits to the colon. Normal large bowel with no diverticulosis, large bowel wall thickening or pericolonic fat stranding. Vascular/Lymphatic: Atherosclerotic abdominal aorta with 4.8 cm infrarenal abdominal aortic aneurysm, stable using similar measurement technique. No pathologically enlarged lymph nodes in the abdomen or pelvis. Reproductive: Mildly enlarged prostate.  Other: No pneumoperitoneum, ascites or focal fluid collection. Musculoskeletal: Predominantly lytic posterior left acetabular 3.1 cm lesion (series 2/image 120), increased from 1.7 cm. Chronic bilateral L5 pars defects with severe degenerative disc disease and 10 mm anterolisthesis at L5-S1. IMPRESSION: 1. Numerous small solid pulmonary nodules scattered throughout both lungs, increased in size and number, compatible with progressive pulmonary metastases. 2. New mild AP window adenopathy, suspicious for nodal metastasis. 3. Bilateral adrenal metastases are slightly increased in size. 4. Predominantly lytic posterior left acetabular 3.1 cm metastasis is increased in size. Increased sclerosis/conspicuity of left posterior element T9 and T5 sclerotic osseous lesions. Findings are compatible with progressive osseous metastases. 5. Stable 4.8 cm infrarenal abdominal aortic aneurysm. Abdominal Aortic Aneurysm (ICD10-I71.9). Recommend follow-up CTA of the abdomen and pelvis in 6 months and vascular surgery referral/consultation. This recommendation follows ACR consensus guidelines: White Paper of the ACR Incidental Findings Committee II on Vascular Findings. J Am Coll Radiol 2013; 10:789-794. 6. Aortic Atherosclerosis (ICD10-I70.0). Additional chronic findings as detailed. Electronically Signed   By: Ilona Sorrel M.D.   On: 04/17/2019 15:53   CT Chest Wo Contrast  Result Date: 04/17/2019 CLINICAL DATA:  Non-small cell left upper lobe lung cancer initially treated with chemotherapy and radiation therapy  completed May 2020, with recurrent metastatic disease to the bones and adrenal glands. Restaging. EXAM: CT CHEST, ABDOMEN AND PELVIS WITHOUT CONTRAST TECHNIQUE: Multidetector CT imaging of the chest, abdomen and pelvis was performed following the standard protocol without IV contrast. COMPARISON:  01/22/2019 PET-CT. 01/13/2019 CT chest, abdomen and pelvis. FINDINGS: CT CHEST FINDINGS Cardiovascular: Normal heart size. No significant pericardial effusion/thickening. Three-vessel coronary atherosclerosis. Right internal jugular Port-A-Cath terminates at the cavoatrial junction. Atherosclerotic nonaneurysmal thoracic aorta. Normal caliber pulmonary arteries. Mediastinum/Nodes: No discrete thyroid nodules. Unremarkable esophagus. No axillary adenopathy. Newly enlarged 1.1 cm AP window node (series 2/image 27). No additional pathologically enlarged mediastinal nodes. No discrete hilar adenopathy on these noncontrast images. Lungs/Pleura: No pneumothorax. No pleural effusion. Moderate centrilobular and paraseptal emphysema with mild diffuse bronchial wall thickening. Numerous (greater than 20) small solid pulmonary nodules scattered throughout both lungs, increased in size and number. For example a 5 mm posterior right upper lobe nodule (series 6/image 61), previously 2 mm on 01/13/2019 CT. Right middle lobe 6 mm nodule (series 6/image 86), increased from 2 mm. Left lower lobe 4 mm nodule (series 6/image 98), increased from 1 mm. Sharply marginated left parahilar consolidation with mild volume loss and distortion, compatible with radiation change. Musculoskeletal: Left posterior element T9 and less discrete T5 sclerotic osseous lesions, increased in sclerosis (series 5/images 88 and 89). Mild thoracic spondylosis. CT ABDOMEN PELVIS FINDINGS Hepatobiliary: Normal liver with no liver mass. Normal gallbladder with no radiopaque cholelithiasis. No biliary ductal dilatation. Pancreas: Normal, with no mass or duct dilation.  Spleen: Normal size. No mass. Adrenals/Urinary Tract: Right adrenal 3.6 cm mass, previously 3.4 cm, slightly increased. Left adrenal 3.3 cm mass, previously 2.8 cm, slightly increased. No hydronephrosis. No renal stones. No contour deforming renal masses. Normal bladder. Stomach/Bowel: Normal non-distended stomach. Normal caliber small bowel with no small bowel wall thickening. Normal appendix. Oral contrast transits to the colon. Normal large bowel with no diverticulosis, large bowel wall thickening or pericolonic fat stranding. Vascular/Lymphatic: Atherosclerotic abdominal aorta with 4.8 cm infrarenal abdominal aortic aneurysm, stable using similar measurement technique. No pathologically enlarged lymph nodes in the abdomen or pelvis. Reproductive: Mildly enlarged prostate. Other: No pneumoperitoneum, ascites or focal fluid collection. Musculoskeletal: Predominantly lytic posterior left acetabular 3.1 cm lesion (  series 2/image 120), increased from 1.7 cm. Chronic bilateral L5 pars defects with severe degenerative disc disease and 10 mm anterolisthesis at L5-S1. IMPRESSION: 1. Numerous small solid pulmonary nodules scattered throughout both lungs, increased in size and number, compatible with progressive pulmonary metastases. 2. New mild AP window adenopathy, suspicious for nodal metastasis. 3. Bilateral adrenal metastases are slightly increased in size. 4. Predominantly lytic posterior left acetabular 3.1 cm metastasis is increased in size. Increased sclerosis/conspicuity of left posterior element T9 and T5 sclerotic osseous lesions. Findings are compatible with progressive osseous metastases. 5. Stable 4.8 cm infrarenal abdominal aortic aneurysm. Abdominal Aortic Aneurysm (ICD10-I71.9). Recommend follow-up CTA of the abdomen and pelvis in 6 months and vascular surgery referral/consultation. This recommendation follows ACR consensus guidelines: White Paper of the ACR Incidental Findings Committee II on Vascular  Findings. J Am Coll Radiol 2013; 10:789-794. 6. Aortic Atherosclerosis (ICD10-I70.0). Additional chronic findings as detailed. Electronically Signed   By: Ilona Sorrel M.D.   On: 04/17/2019 15:53   DG Chest Portable 1 View  Result Date: 04/18/2019 CLINICAL DATA:  Hypotension. Near syncope. Dizziness. EXAM: PORTABLE CHEST 1 VIEW COMPARISON:  Chest CT yesterday. FINDINGS: Right chest port remains in place. Chronic hyperinflation with emphysema. Volume loss in the left hemithorax with left perihilar opacities, stable from CT yesterday. Heart is normal in size. Aortic atherosclerosis. No acute airspace disease, pulmonary edema, pneumothorax or large pleural effusion. Pulmonary nodules on CT yesterday are not well demonstrated radiographically no acute osseous abnormalities are seen. Enteric contrast in the splenic flexure of the colon from yesterday's CT. IMPRESSION: 1. No acute findings. 2. Sequela of left lung cancer with volume loss in the left hemithorax and perihilar opacities, assessed on CT yesterday. 3. Hyperinflation and emphysema.  Aortic atherosclerosis. Aortic Atherosclerosis (ICD10-I70.0) and Emphysema (ICD10-J43.9). Electronically Signed   By: Keith Rake M.D.   On: 04/18/2019 16:05    EKG: Independently reviewed. NSR w/o acute changes  Assessment/Plan Active Problems:   Hypotension   Elevated troponin level   Essential hypertension, benign   Pure hypercholesterolemia   Adenocarcinoma of left lung, stage 2 (HCC)   Elevated troponin  (please populate well all problems here in Problem List. (For example, if patient is on BP meds at home and you resume or decide to hold them, it is a problem that needs to be her. Same for CAD, COPD, HLD and so on)   1. Hypotension - patient with intermittent hypotension. He is stable at time of exam. He has known adrenal mets. He was recently seen by Dr. Radford Pax for cardiology and was stable. Plan Tele observation  Orthostatic vitals q AM  Fasting  AM cortisol level to r/o adrenal insufficiency  2. Elevated Troponin - by Cath 2014 40% LAD, 30-40% RCA. Nuc Stress 04/2018 - negative for ischemia. Echo 04/2018 nl study. Asymptomatic. EKG normal Plan Tele observation  Second set of enzymes  EKG in AM  EDP contacted cardiology on call  3. Lung cancer - undergoing chem, next treatment 04/21/19. As a cancer patient concern for hypercoagulable state and risk of chronic PE. He has dye allergy and steroid allergy so not a candidate for CTA-chest. Plan Full dose heparin until ruled out  D-Dimer ordered and pending  V/Q scan 3/14  4. Code status - discussed with patient. He has a living will and states that he wants full code treatment if there is change of recovery. Plan Full code  Advised him to discuss further with oncology and PCP  DVT prophylaxis:  heparin (Lovenox/Heparin/SCD's/anticoagulated/None (if comfort care) Code Status: full code (Full/Partial (specify details) Family Communication: spoke with wife. Encouraged code discussion and referred her to [email or web address]. [facility] name, relationship. Do not write "discussed with patient". Specify tel # if discussed over the phone) Disposition Plan: home 24-48 hrs (specify when and where you expect patient to be discharged) Consults called: cardiology fellow was called by [facility] (with names) Admission status: observation (inpatient / obs / tele / medical floor / SDU)   Adella Hare MD [facility] Pager [phone]  If 7PM-7AM, please contact night-coverage [email or web address] Password [facility]  04/18/2019, 8:39 PM

## 2019-04-18 NOTE — Progress Notes (Signed)
[place] for heparin IV Indication: suspected PE  Allergies  Allergen Reactions  . Biaxin [Clarithromycin] Shortness Of Breath  . Clarithromycin Shortness Of Breath  . Iodinated Diagnostic Agents Hives  . Prednisone Other (See Comments)    Patient states he turns REALLY RED all over.  . Cortisone Other (See Comments)    Turns red from breast up  . Flomax [Tamsulosin Hcl]     Blurred vision and lower back pain   . Hepatitis B Virus Vaccines     Nerve problems States his previous doctor told him it was due to this   . Iodine Hives    Dye from nuclear medicine test    Patient Measurements: Height: 6\' 1"  (185.4 cm) Weight: 182 lb 3.2 oz (82.6 kg) IBW/kg (Calculated) : 79.9 Heparin Dosing Weight: TBW  Vital Signs: Temp: 98.1 F (36.7 C) (03/13 1544) Temp Source: Oral (03/13 1544) BP: 127/72 (03/13 1730) Pulse Rate: 96 (03/13 1730)  Labs: Recent Labs    04/18/19 1531 04/18/19 1725  HGB 8.4*  --   HCT 25.8*  --   PLT 123*  --   CREATININE 0.94  --   TROPONINIHS 108* 109*    Estimated Creatinine Clearance: 79.1 mL/min (by C-G formula based on SCr of 0.94 mg/dL).   Medical History: Past Medical History:  Diagnosis Date  . AAA (abdominal aortic aneurysm) ([place])   . Allergic rhinitis   . Anxiety   . Back pain   . CAD (coronary artery disease)   . Cataracts, bilateral   . Emphysema, unspecified ([place])     " moderate"  . Essential hypertension, benign   . GERD (gastroesophageal reflux disease)   . Gout   . Headache    migraines  . Hilar adenopathy   . History of hiatal hernia   . History of sciatica   . Hyperlipidemia   . lung ca dx'd 02/2018  . Lung nodule   . Pneumonia    as child  . Prostate hypertrophy   . PVD (peripheral vascular disease) ([place])   . Seasonal allergies   . Subclavian steal syndrome of left subclavian artery   . Wears dentures     Medications:  (Not in a hospital admission)  Scheduled:  .  heparin  2,500 Units Intravenous Once   Infusions:  . sodium chloride    . heparin     Assessment: 46 yoM with PMH AAA, CAD, NSCLC on chemo (last given 2/23), presenting with hypotension. Initially low suspicion for PE, but with troponins now elevated, will empirically start IV heparin. Pt unable to tolerate CT with contrast d/t allergies, so will plan for VQ scan.   Baseline INR, aPTT: not done (no prior anticoag)  Prior anticoagulation: none, on Plavix PTA  Significant events:  Today, 04/18/2019:  CBC: Hgb low d/t chemo (baseline 10-13g); Plt slightly low (nadir 69 after last chemo cycle)  SCr WNL and at baseline  No bleeding or infusion issues per nursing  Goal of Therapy: Heparin level 0.3-0.7 units/ml Monitor platelets by anticoagulation protocol: Yes  Plan:  Heparin 2500 units IV bolus x 1  Heparin 1400 units/hr IV infusion  Check heparin level 8 hrs after start  Daily CBC, daily heparin level once stable  Monitor for signs of bleeding or thrombosis  Reuel Boom, PharmD, BCPS [phone] 04/18/2019, 7:31 PM

## 2019-04-19 DIAGNOSIS — K649 Unspecified hemorrhoids: Secondary | ICD-10-CM | POA: Diagnosis present

## 2019-04-19 DIAGNOSIS — Z03818 Encounter for observation for suspected exposure to other biological agents ruled out: Secondary | ICD-10-CM | POA: Diagnosis not present

## 2019-04-19 DIAGNOSIS — R000 Tachycardia, unspecified: Secondary | ICD-10-CM | POA: Diagnosis present

## 2019-04-19 DIAGNOSIS — R42 Dizziness and giddiness: Secondary | ICD-10-CM

## 2019-04-19 DIAGNOSIS — C7972 Secondary malignant neoplasm of left adrenal gland: Secondary | ICD-10-CM | POA: Diagnosis present

## 2019-04-19 DIAGNOSIS — E785 Hyperlipidemia, unspecified: Secondary | ICD-10-CM | POA: Diagnosis present

## 2019-04-19 DIAGNOSIS — E861 Hypovolemia: Secondary | ICD-10-CM | POA: Diagnosis not present

## 2019-04-19 DIAGNOSIS — E869 Volume depletion, unspecified: Secondary | ICD-10-CM | POA: Diagnosis present

## 2019-04-19 DIAGNOSIS — K219 Gastro-esophageal reflux disease without esophagitis: Secondary | ICD-10-CM | POA: Diagnosis present

## 2019-04-19 DIAGNOSIS — I10 Essential (primary) hypertension: Secondary | ICD-10-CM | POA: Diagnosis present

## 2019-04-19 DIAGNOSIS — C7971 Secondary malignant neoplasm of right adrenal gland: Secondary | ICD-10-CM | POA: Diagnosis present

## 2019-04-19 DIAGNOSIS — E7800 Pure hypercholesterolemia, unspecified: Secondary | ICD-10-CM | POA: Diagnosis not present

## 2019-04-19 DIAGNOSIS — E876 Hypokalemia: Secondary | ICD-10-CM | POA: Diagnosis present

## 2019-04-19 DIAGNOSIS — I959 Hypotension, unspecified: Secondary | ICD-10-CM | POA: Diagnosis not present

## 2019-04-19 DIAGNOSIS — I2510 Atherosclerotic heart disease of native coronary artery without angina pectoris: Secondary | ICD-10-CM | POA: Diagnosis present

## 2019-04-19 DIAGNOSIS — I739 Peripheral vascular disease, unspecified: Secondary | ICD-10-CM | POA: Diagnosis present

## 2019-04-19 DIAGNOSIS — Z955 Presence of coronary angioplasty implant and graft: Secondary | ICD-10-CM | POA: Diagnosis not present

## 2019-04-19 DIAGNOSIS — D649 Anemia, unspecified: Secondary | ICD-10-CM | POA: Diagnosis not present

## 2019-04-19 DIAGNOSIS — I952 Hypotension due to drugs: Secondary | ICD-10-CM | POA: Diagnosis present

## 2019-04-19 DIAGNOSIS — C3492 Malignant neoplasm of unspecified part of left bronchus or lung: Secondary | ICD-10-CM | POA: Diagnosis present

## 2019-04-19 DIAGNOSIS — I9589 Other hypotension: Secondary | ICD-10-CM | POA: Diagnosis not present

## 2019-04-19 DIAGNOSIS — I714 Abdominal aortic aneurysm, without rupture: Secondary | ICD-10-CM | POA: Diagnosis present

## 2019-04-19 DIAGNOSIS — R7989 Other specified abnormal findings of blood chemistry: Secondary | ICD-10-CM | POA: Diagnosis not present

## 2019-04-19 DIAGNOSIS — H269 Unspecified cataract: Secondary | ICD-10-CM | POA: Diagnosis present

## 2019-04-19 DIAGNOSIS — J302 Other seasonal allergic rhinitis: Secondary | ICD-10-CM | POA: Diagnosis present

## 2019-04-19 DIAGNOSIS — R778 Other specified abnormalities of plasma proteins: Secondary | ICD-10-CM

## 2019-04-19 DIAGNOSIS — J439 Emphysema, unspecified: Secondary | ICD-10-CM | POA: Diagnosis present

## 2019-04-19 DIAGNOSIS — D630 Anemia in neoplastic disease: Secondary | ICD-10-CM | POA: Diagnosis present

## 2019-04-19 DIAGNOSIS — Z20822 Contact with and (suspected) exposure to covid-19: Secondary | ICD-10-CM | POA: Diagnosis present

## 2019-04-19 DIAGNOSIS — Z95828 Presence of other vascular implants and grafts: Secondary | ICD-10-CM | POA: Diagnosis not present

## 2019-04-19 DIAGNOSIS — T451X5A Adverse effect of antineoplastic and immunosuppressive drugs, initial encounter: Secondary | ICD-10-CM | POA: Diagnosis present

## 2019-04-19 DIAGNOSIS — I248 Other forms of acute ischemic heart disease: Secondary | ICD-10-CM | POA: Diagnosis present

## 2019-04-19 LAB — CBC
HCT: 19.7 % — ABNORMAL LOW (ref 39.0–52.0)
Hemoglobin: 6.5 g/dL — CL (ref 13.0–17.0)
MCH: 30.1 pg (ref 26.0–34.0)
MCHC: 33 g/dL (ref 30.0–36.0)
MCV: 91.2 fL (ref 80.0–100.0)
Platelets: 107 10*3/uL — ABNORMAL LOW (ref 150–400)
RBC: 2.16 MIL/uL — ABNORMAL LOW (ref 4.22–5.81)
RDW: 17.7 % — ABNORMAL HIGH (ref 11.5–15.5)
WBC: 2.9 10*3/uL — ABNORMAL LOW (ref 4.0–10.5)
nRBC: 0 % (ref 0.0–0.2)

## 2019-04-19 LAB — SARS CORONAVIRUS 2 (TAT 6-24 HRS): SARS Coronavirus 2: NEGATIVE

## 2019-04-19 LAB — CORTISOL-AM, BLOOD: Cortisol - AM: 13.7 ug/dL (ref 6.7–22.6)

## 2019-04-19 LAB — ABO/RH: ABO/RH(D): O POS

## 2019-04-19 LAB — IRON AND TIBC
Iron: 83 ug/dL (ref 45–182)
Saturation Ratios: 35 % (ref 17.9–39.5)
TIBC: 238 ug/dL — ABNORMAL LOW (ref 250–450)
UIBC: 155 ug/dL

## 2019-04-19 LAB — PREPARE RBC (CROSSMATCH)

## 2019-04-19 LAB — TROPONIN I (HIGH SENSITIVITY)
Troponin I (High Sensitivity): 65 ng/L — ABNORMAL HIGH (ref <18)
Troponin I (High Sensitivity): 70 ng/L — ABNORMAL HIGH (ref <18)

## 2019-04-19 LAB — FERRITIN: Ferritin: 671 ng/mL — ABNORMAL HIGH (ref 24–336)

## 2019-04-19 MED ORDER — MAGNESIUM OXIDE 400 (241.3 MG) MG PO TABS
400.0000 mg | ORAL_TABLET | Freq: Two times a day (BID) | ORAL | Status: DC
  Administered 2019-04-19 – 2019-04-20 (×3): 400 mg via ORAL
  Filled 2019-04-19 (×3): qty 1

## 2019-04-19 MED ORDER — POTASSIUM CHLORIDE CRYS ER 20 MEQ PO TBCR
40.0000 meq | EXTENDED_RELEASE_TABLET | Freq: Once | ORAL | Status: AC
  Administered 2019-04-19: 40 meq via ORAL
  Filled 2019-04-19: qty 2

## 2019-04-19 MED ORDER — SODIUM CHLORIDE 0.9% IV SOLUTION: Freq: Once | INTRAVENOUS | Status: DC

## 2019-04-19 MED ORDER — SODIUM CHLORIDE 0.9% FLUSH
10.0000 mL | Freq: Two times a day (BID) | INTRAVENOUS | Status: DC
  Administered 2019-04-19: 10 mL

## 2019-04-19 MED ORDER — CLOPIDOGREL BISULFATE 75 MG PO TABS
75.0000 mg | ORAL_TABLET | Freq: Every day | ORAL | Status: DC
  Administered 2019-04-19 – 2019-04-20 (×2): 75 mg via ORAL
  Filled 2019-04-19 (×2): qty 1

## 2019-04-19 MED ORDER — CHLORHEXIDINE GLUCONATE CLOTH 2 % EX PADS
6.0000 | MEDICATED_PAD | Freq: Every day | CUTANEOUS | Status: DC
  Administered 2019-04-19 – 2019-04-20 (×2): 6 via TOPICAL

## 2019-04-19 MED ORDER — SODIUM CHLORIDE 0.9% FLUSH: 10.0000 mL | INTRAVENOUS | Status: DC | PRN

## 2019-04-19 NOTE — Evaluation (Signed)
Physical Therapy Evaluation Patient Details Name: Seth [name] MRN: [id number] DOB: 1945/04/14 Today's Date: 04/19/2019   History of Present Illness  74 year old male in observation with "dizziness", and needing a blood transfusion for anemia. PMH includes CAD, Stage 4 NSCCL. Hypotension, increased troponin level , and potential for chronic PEs.  Clinical Impression  Patient resting in bed, supine with HOB elevated, receiving blood transfusion. RN had cleared for patient to be seen at bed level. Spouse visiting at bedside. They live in 2 story home. Tub-shower combo with bench, grab bars. Commode is standard height. He uses a rollator , and SPC for ambulation. According to he and spouse, he is able to bathe and dress self with set ups. He did fracture left ankle in OCT 2020 and has been attending PT twice weekly for several weeks, and plans to continue as he completes his chemo. He had chemo a year ago. This time, they plan only for him to take chemo and no radiation. He can drive- but spouse has been for the last few months due to his ankle fracture. He agreed to ex format as transfusion was very positional and he does have central line. He potentially will be discharged tomorrow or by Tuesday. Should benefit from PT for optimal functional to address general strengthening, energy conservation, postural awareness, and reduce risk of falls in gait.     Follow Up Recommendations Outpatient PT(He already is taking outpatient PT for an injury to LEFT foot/ankle)    Equipment Recommendations       Recommendations for Other Services       Precautions / Restrictions Precautions Precautions: Fall Precaution Comments: Monitor closely- skin and joint integrity, also s/s of orthstasis. Restrictions Weight Bearing Restrictions: No      Mobility  Bed Mobility   Bed Mobility: Rolling           General bed mobility comments: Has been performing bed mobility with nursing personnel while here, and  with transfusion and central line he did not want to attempt any additional mobility today- will reschedule and further assess tomorrow  Transfers                    Ambulation/Gait                Stairs            Wheelchair Mobility    Modified Rankin (Stroke Patients Only)       Balance                                             Pertinent Vitals/Pain Pain Assessment: 0-10 Pain Score: 5 (back and left foot/ankle) Pain Descriptors / Indicators: Aching;Pressure    Home Living Family/patient expects to be discharged to:: Private residence   Available Help at Discharge: Family Type of Home: House Home Access: Stairs to enter     Home Layout: Two level Home Equipment: Environmental consultant - 4 wheels;Cane - single point      Prior Function Level of Independence: Needs assistance   Gait / Transfers Assistance Needed: primarily standby while ambulating  ADL's / Homemaking Assistance Needed: Can bathe and dress with set up in intermittent supervision        Hand Dominance   Dominant Hand: Right    Extremity/Trunk Assessment        Lower Extremity  Assessment Lower Extremity Assessment: Generalized weakness    Cervical / Trunk Assessment Cervical / Trunk Assessment: Kyphotic  Communication   Communication: No difficulties  Cognition Arousal/Alertness: Awake/alert   Overall Cognitive Status: Within Functional Limits for tasks assessed                                 General Comments: wife present visiting and presents as healthy and supportive- his primary caregiver      General Comments General comments (skin integrity, edema, etc.): monitor skin and joint integrity- tone and turgor fair    Exercises General Exercises - Lower Extremity Ankle Circles/Pumps: AROM;AAROM;Supine;10 reps Quad Sets: AROM;AAROM;Supine;10 reps Gluteal Sets: AROM;Supine;10 reps Short Arc Quad: AROM;AAROM;Supine;10 reps Heel Slides:  AROM;AAROM;Supine;10 reps Hip ABduction/ADduction: AROM;AAROM;10 reps;Supine Other Exercises Other Exercises: good return demo on LE and printed ex sheet issued. Also instructed in UE flexion stretching , and abduction. Has yellow and orange theraband at home. Takes PT 2 x week   Assessment/Plan    PT Assessment    PT Problem List         PT Treatment Interventions      PT Goals (Current goals can be found in the Care Plan section)  Acute Rehab PT Goals Patient Stated Goal: to get well and continue to get stronger with PT PT Goal Formulation: With patient Time For Goal Achievement: 05/03/19 Potential to Achieve Goals: Good    Frequency     Barriers to discharge        Co-evaluation               AM-PAC PT "6 Clicks" Mobility  Outcome Measure Help needed turning from your back to your side while in a flat bed without using bedrails?: A Little Help needed moving from lying on your back to sitting on the side of a flat bed without using bedrails?: A Little Help needed moving to and from a bed to a chair (including a wheelchair)?: A Little Help needed standing up from a chair using your arms (e.g., wheelchair or bedside chair)?: A Little Help needed to walk in hospital room?: A Lot Help needed climbing 3-5 steps with a railing? : A Lot 6 Click Score: 16    End of Session   Activity Tolerance: Patient tolerated treatment well Patient left: in bed;with call bell/phone within reach;with bed alarm set   PT Visit Diagnosis: Muscle weakness (generalized) (M62.81)    Time: 5400-8676 PT Time Calculation (min) (ACUTE ONLY): 34 min   Charges:   PT Evaluation $PT Eval Moderate Complexity: 1 Mod PT Treatments $Therapeutic Exercise: 8-22 mins        Rollen Sox, PT # [phone] CGV cell   Casandra Doffing 04/19/2019, 3:14 PM

## 2019-04-19 NOTE — Progress Notes (Signed)
Spoke with provider Berna Spare he advised for the pt Heparin to be stopped due to the pt hemoglobin level being 6.5

## 2019-04-19 NOTE — Consult Note (Addendum)
Cardiology Consultation:   Patient ID: Seth [name] MRN: [id number]; DOB: Apr 30, 1945  Admit date: 04/18/2019 Date of Consult: 04/19/2019  Primary Care Provider: [name], MD Primary Cardiologist: Fransico Him, MD Primary Electrophysiologist:  None    Patient Profile:   Seth [name] is a 74 y.o. male with a hx of metastatic lung cancer, non-obstructive CAD, PAD, HLD, HTN, who presented to the ED with hypotension and heart racing is being seen today for the evaluation of elevated troponin at the request of Dr. Langston Masker.  History of Present Illness:   Seth [name] is a 74 y.o. male with a hx of metastatic lung cancer, non-obstructive CAD, PAD, HLD, HTN, who presented to the ED with hypotension and heart racing is being seen today for the evaluation of elevated troponin at the request of Dr. Langston Masker.  The patient has a history of CAD and PAD and follows with Dr. Radford Pax. He was last seen in clinic on 04/06/19 at which time he was doing well. He reported some intermittent dizziness that was associated with hypotension, for which his lisinopril was stopped. Other than DOE with extreme exertion and denied any cardiac symptoms.   Mr. Coakley also has known metastatic lung cancer, for which he receives chemotherapy. As a result of his chemotherapy, he has been noted to have low BP with associated lightheadedness. He has treated these episodes with laying down and drinking fluids. He notably underwent repeat CT scan two days ago that showed new pulmonary nodules concerning for progression of his metastatic disease.   He presented to the [facility] ED yesterday after experiencing lightheadedness with BP of 83/67. He also reported HR in the 140s. He denied chest pain or dyspnea or other cardiac symptoms.    At the outside ED, the patient was noted to have sinus tachycardia to the low 100s with SBP 130s. ECG showed NSR with nonspecific ST changes. Labs included K 3.3, Hgb 8.4 (down to 6.5 this morning), elevated  D-dimer. Troponin was also checked and was 108 which was stable at 109 on recheck. He is admitted to the hospitalist service for workup of possible PE (with VQ scan given dye allergy). Cardiology was consulted for evauation of his elevated troponin.  On my evaluation, the patient is resting in bed. He states that his last chemotherapy session was weeks ago but that he has been experiencing ongoing dizziness and hypotension intermittently since then. He is unaware of any specific triggers or changes in his routine that preceded his episode yesterday. He reports that he noticed that his heart was racing when his BP was low, but he denied associated chest pain, dyspnea, or other symptoms. Although he has experienced near syncope, he has not lost consciousness.    Past Medical History:  Diagnosis Date  . AAA (abdominal aortic aneurysm) ([place])   . Allergic rhinitis   . Anxiety   . Back pain   . CAD (coronary artery disease)   . Cataracts, bilateral   . Emphysema, unspecified ([place])     " moderate"  . Essential hypertension, benign   . GERD (gastroesophageal reflux disease)   . Gout   . Headache    migraines  . Hilar adenopathy   . History of hiatal hernia   . History of sciatica   . Hyperlipidemia   . lung ca dx'd 02/2018  . Lung nodule   . Pneumonia    as child  . Prostate hypertrophy   . PVD (peripheral vascular disease) ([place])   .  Seasonal allergies   . Subclavian steal syndrome of left subclavian artery   . Wears dentures     Past Surgical History:  Procedure Laterality Date  . CARDIAC CATHETERIZATION     stent placement  . COLONOSCOPY W/ BIOPSIES AND POLYPECTOMY    . ILIAC ARTERY STENT    . IR IMAGING GUIDED PORT INSERTION  03/23/2019  . MULTIPLE TOOTH EXTRACTIONS    . VIDEO BRONCHOSCOPY WITH ENDOBRONCHIAL NAVIGATION N/A 02/26/2018   Procedure: VIDEO BRONCHOSCOPY WITH ENDOBRONCHIAL NAVIGATION;  Surgeon: Garner Nash, DO;  Location: [place];  Service: Thoracic;  Laterality:  N/A;  . VIDEO BRONCHOSCOPY WITH ENDOBRONCHIAL NAVIGATION N/A 03/19/2018   Procedure: VIDEO BRONCHOSCOPY WITH ENDOBRONCHIAL NAVIGATION and endobronchial ultrasound;  Surgeon: Garner Nash, DO;  Location: [place];  Service: Thoracic;  Laterality: N/A;  . VIDEO BRONCHOSCOPY WITH ENDOBRONCHIAL ULTRASOUND N/A 02/26/2018   Procedure: VIDEO BRONCHOSCOPY WITH ENDOBRONCHIAL ULTRASOUND;  Surgeon: Garner Nash, DO;  Location: [place];  Service: Thoracic;  Laterality: N/A;  . VIDEO BRONCHOSCOPY WITH ENDOBRONCHIAL ULTRASOUND N/A 03/19/2018   Procedure: NAVIGATION BRONCHOSCOPY;  Surgeon: Garner Nash, DO;  Location: [place];  Service: Thoracic;  Laterality: N/A;     Home Medications:  Prior to Admission medications   Medication Sig Start Date End Date Taking? Authorizing Provider  atorvastatin (LIPITOR) 40 MG tablet Take 1 tablet (40 mg total) by mouth daily. 04/09/18 04/18/19 Yes Turner, Eber Hong, MD  cetirizine (ZYRTEC) 10 MG tablet Take 10 mg by mouth daily at 12 noon.   Yes [provider]  clopidogrel (PLAVIX) 75 MG tablet TAKE 1 TABLET BY MOUTH EVERY DAY Patient taking differently: Take 75 mg by mouth daily.  02/19/19  Yes [name], MD  famotidine (PEPCID) 20 MG tablet Take 20 mg by mouth 2 (two) times daily.    Yes [provider]  folic acid (FOLVITE) 1 MG tablet Take 1 tablet (1 mg total) by mouth daily. 02/03/19  Yes Curt Bears, MD  hydroxypropyl methylcellulose / hypromellose (ISOPTO TEARS / GONIOVISC) 2.5 % ophthalmic solution Place 1 drop into both eyes 3 (three) times daily.   Yes [provider]  metoprolol tartrate (LOPRESSOR) 25 MG tablet Take 1 tablet (25 mg total) by mouth 2 (two) times daily. Only take if systolic blood pressure is >110 Patient taking differently: Take 25 mg by mouth 2 (two) times daily as needed. Only take if systolic blood pressure is >110 04/06/19  Yes Turner, Eber Hong, MD  sodium chloride (OCEAN) 0.65 % SOLN nasal spray Place 1  spray into both nostrils 2 (two) times daily as needed for congestion.   Yes [provider]  lidocaine-prilocaine (EMLA) cream Apply to Port-A-Cath site 30 minutes before infusion. Patient taking differently: Apply 1 application topically daily as needed. Apply to [facility] for pain -A-Cath site 30 minutes before infusion. 03/10/19   Curt Bears, MD  magic mouthwash SOLN Take 5 mLs by mouth 4 (four) times daily as needed for mouth pain. 04/03/19   Heilingoetter, Cassandra L, PA-C  prochlorperazine (COMPAZINE) 10 MG tablet Take 1 tablet (10 mg total) by mouth every 6 (six) hours as needed for nausea or vomiting. 02/03/19   Curt Bears, MD    Inpatient Medications: Scheduled Meds: . atorvastatin  40 mg Oral Daily  . Chlorhexidine Gluconate Cloth  6 each Topical Daily  . famotidine  20 mg Oral BID  . metoprolol tartrate  25 mg Oral BID  . potassium chloride  20 mEq Oral  Daily  . sodium chloride flush  10-40 mL Intracatheter Q12H   Continuous Infusions: . dextrose 5 % and 0.45% NaCl 75 mL/hr at 04/18/19 2316  . heparin 1,400 Units/hr (04/18/19 2202)   PRN Meds: acetaminophen **OR** acetaminophen, magic mouthwash, ondansetron **OR** ondansetron (ZOFRAN) IV, prochlorperazine, sodium chloride flush  Allergies:    Allergies  Allergen Reactions  . Biaxin [Clarithromycin] Shortness Of Breath  . Clarithromycin Shortness Of Breath  . Iodinated Diagnostic Agents Hives  . Prednisone Other (See Comments)    Patient states he turns REALLY RED all over.  . Cortisone Other (See Comments)    Turns red from breast up  . Flomax [Tamsulosin Hcl]     Blurred vision and lower back pain   . Hepatitis B Virus Vaccines     Nerve problems States his previous doctor told him it was due to this   . Iodine Hives    Dye from nuclear medicine test    Social History:   Social History   Socioeconomic History  . Marital status: Married    Spouse name: Not on file  . Number of children: 1  .  Years of education: Not on file  . Highest education level: Not on file  Occupational History  . Occupation: retired  Tobacco Use  . Smoking status: Former Smoker    Packs/day: 3.00    Years: 50.00    Pack years: 150.00    Types: Cigarettes    Quit date: 04/05/2016    Years since quitting: 3.0  . Smokeless tobacco: Never Used  Substance and Sexual Activity  . Alcohol use: Yes    Alcohol/week: 0.0 standard drinks    Comment: rare  . Drug use: No  . Sexual activity: Not on file  Other Topics Concern  . Not on file  Social History Narrative   Married   Exercise: No   Education: GED   Social Determinants of Radio broadcast assistant Strain:   . Difficulty of Paying Living Expenses:   Food Insecurity:   . Worried About Charity fundraiser in the Last Year:   . Arboriculturist in the Last Year:   Transportation Needs:   . Film/video editor (Medical):   Marland Kitchen Lack of Transportation (Non-Medical):   Physical Activity:   . Days of Exercise per Week:   . Minutes of Exercise per Session:   Stress:   . Feeling of Stress :   Social Connections:   . Frequency of Communication with Friends and Family:   . Frequency of Social Gatherings with Friends and Family:   . Attends Religious Services:   . Active Member of Clubs or Organizations:   . Attends Archivist Meetings:   Marland Kitchen Marital Status:   Intimate Partner Violence:   . Fear of Current or Ex-Partner:   . Emotionally Abused:   Marland Kitchen Physically Abused:   . Sexually Abused:     Family History:    Family History  Problem Relation Age of Onset  . Hypertension Mother   . Heart disease Mother   . Stroke Mother   . Heart disease Paternal Grandmother   . Diabetes Paternal Grandmother   . Hyperlipidemia Paternal Grandmother   . Diabetes Sister   . Heart disease Sister   . Hyperlipidemia Sister   . Heart disease Brother   . Hyperlipidemia Brother   . Hypertension Brother   . Diabetes Paternal Grandfather   . Heart  disease Paternal Grandfather   .  Hyperlipidemia Paternal Grandfather   . Heart disease Maternal Uncle        x 2  . Cancer Maternal Grandmother        type unknown, ? lung  . Leukemia Maternal Uncle      ROS:  Please see the history of present illness.  All other ROS reviewed and negative.     Physical Exam/Data:   Vitals:   04/18/19 2000 04/18/19 2030 04/18/19 2052 04/18/19 2059  BP: 127/83 129/69  140/81  Pulse: (!) 104 (!) 102  (!) 107  Resp: 17 (!) 21  18  Temp:    98.3 F (36.8 C)  TempSrc:    Oral  SpO2: 99% 97%  100%  Weight:   79.9 kg   Height:   6\' 1"  (1.854 m)     Intake/Output Summary (Last 24 hours) at 04/19/2019 0500 Last data filed at 04/19/2019 0300 Gross per 24 hour  Intake 500.63 ml  Output 150 ml  Net 350.63 ml   Last 3 Weights 04/18/2019 04/18/2019 04/06/2019  Weight (lbs) 176 lb 3.2 oz 182 lb 3.2 oz 182 lb 3.2 oz  Weight (kg) 79.924 kg 82.645 kg 82.645 kg     Body mass index is 23.25 kg/m.  General:  Well nourished, well developed, in no acute distress HEENT: normal Neck: no JVD Cardiac:  normal S1, S2; RRR; no murmur  Lungs:  clear to auscultation bilaterally, no wheezing, rhonchi or rales  Abd: soft, nontender  Ext: no edema Musculoskeletal:  No deformities, BUE and BLE strength normal and equal Skin: warm and dry  Neuro:  No focal abnormalities noted Psych:  Normal affect   EKG:  The EKG was personally reviewed and demonstrates: NSR with nonspecific ST change Telemetry:  Telemetry was personally reviewed and demonstrates:  Sinus rhythm  Relevant CV Studies:  Nuclear stress test 04/2018:  Nuclear stress EF: 64%.  The left ventricular ejection fraction is normal (55-65%).  There was no ST segment deviation noted during stress.  The study is normal.  This is a low risk study.   CP exercise test 04/2018: Conclusion: Exercise testing with gas exchange demonstrates moderately reduced functional capacity when compared to matched sedentary  norms. There is moderate restriction detected with pre-exercise spirometry. There is a slightly increased risk of complication, given moderate to severely reduced VO2 and elevated VE/VCO2 slope. Patient's body habitus is contributing to exercise intolerance. There was chronotropic incompetence.   TTE 04/2018: 1. The left ventricle has normal systolic function with an ejection  fraction of 60-65%. The cavity size was normal. Left ventricular diastolic  parameters were normal No evidence of left ventricular regional wall  motion abnormalities.  2. The right ventricle has normal systolic function. The cavity was  normal. There is no increase in right ventricular wall thickness. Right  ventricular systolic pressure could not be assessed.  3. Left atrial size was mildly dilated.  4. The mitral valve is normal in structure.  5. Trileaflet aortic valve with mild sclerosis, without stenosis.  6. The aortic root and ascending aorta are normal in size and structure.   [facility] 2014: Nonobstructive CAD with 40% stenosis in the mid LAD after the takeoff of the first diagonal, patent left circumflex with OM x2, patent RCA with 30 to 40% diffuse irregularities in the proximal portion  Laboratory Data:  High Sensitivity Troponin:   Recent Labs  Lab 04/18/19 1531 04/18/19 1725  TROPONINIHS 108* 109*     Chemistry Recent Labs  Lab  04/14/19 1227 04/18/19 1531  NA 137 133*  K 3.3* 3.3*  CL 100 98  CO2 25 24  GLUCOSE 109* 120*  BUN 12 14  CREATININE 0.99 0.94  CALCIUM 8.5* 8.7*  GFRNONAA >60 >60  GFRAA >60 >60  ANIONGAP 12 11    Recent Labs  Lab 04/14/19 1227  PROT 7.0  ALBUMIN 3.6  AST 25  ALT 21  ALKPHOS 139*  BILITOT 0.9   Hematology Recent Labs  Lab 04/14/19 1227 04/18/19 1531 04/19/19 0401  WBC 3.2* 4.5 2.9*  RBC 3.03* 2.87* 2.16*  HGB 9.0* 8.4* 6.5*  HCT 27.0* 25.8* 19.7*  MCV 89.1 89.9 91.2  MCH 29.7 29.3 30.1  MCHC 33.3 32.6 33.0  RDW 15.9* 17.6* 17.7*  PLT 69*  123* 107*   BNPNo results for input(s): BNP, PROBNP in the last 168 hours.  DDimer  Recent Labs  Lab 04/18/19 1601  DDIMER 2.09*     Radiology/Studies:  CT Abdomen Pelvis Wo Contrast  Result Date: 04/17/2019 CLINICAL DATA:  Non-small cell left upper lobe lung cancer initially treated with chemotherapy and radiation therapy completed May 2020, with recurrent metastatic disease to the bones and adrenal glands. Restaging. EXAM: CT CHEST, ABDOMEN AND PELVIS WITHOUT CONTRAST TECHNIQUE: Multidetector CT imaging of the chest, abdomen and pelvis was performed following the standard protocol without IV contrast. COMPARISON:  01/22/2019 PET-CT. 01/13/2019 CT chest, abdomen and pelvis. FINDINGS: CT CHEST FINDINGS Cardiovascular: Normal heart size. No significant pericardial effusion/thickening. Three-vessel coronary atherosclerosis. Right internal jugular Port-A-Cath terminates at the cavoatrial junction. Atherosclerotic nonaneurysmal thoracic aorta. Normal caliber pulmonary arteries. Mediastinum/Nodes: No discrete thyroid nodules. Unremarkable esophagus. No axillary adenopathy. Newly enlarged 1.1 cm AP window node (series 2/image 27). No additional pathologically enlarged mediastinal nodes. No discrete hilar adenopathy on these noncontrast images. Lungs/Pleura: No pneumothorax. No pleural effusion. Moderate centrilobular and paraseptal emphysema with mild diffuse bronchial wall thickening. Numerous (greater than 20) small solid pulmonary nodules scattered throughout both lungs, increased in size and number. For example a 5 mm posterior right upper lobe nodule (series 6/image 61), previously 2 mm on 01/13/2019 CT. Right middle lobe 6 mm nodule (series 6/image 86), increased from 2 mm. Left lower lobe 4 mm nodule (series 6/image 98), increased from 1 mm. Sharply marginated left parahilar consolidation with mild volume loss and distortion, compatible with radiation change. Musculoskeletal: Left posterior element T9  and less discrete T5 sclerotic osseous lesions, increased in sclerosis (series 5/images 88 and 89). Mild thoracic spondylosis. CT ABDOMEN PELVIS FINDINGS Hepatobiliary: Normal liver with no liver mass. Normal gallbladder with no radiopaque cholelithiasis. No biliary ductal dilatation. Pancreas: Normal, with no mass or duct dilation. Spleen: Normal size. No mass. Adrenals/Urinary Tract: Right adrenal 3.6 cm mass, previously 3.4 cm, slightly increased. Left adrenal 3.3 cm mass, previously 2.8 cm, slightly increased. No hydronephrosis. No renal stones. No contour deforming renal masses. Normal bladder. Stomach/Bowel: Normal non-distended stomach. Normal caliber small bowel with no small bowel wall thickening. Normal appendix. Oral contrast transits to the colon. Normal large bowel with no diverticulosis, large bowel wall thickening or pericolonic fat stranding. Vascular/Lymphatic: Atherosclerotic abdominal aorta with 4.8 cm infrarenal abdominal aortic aneurysm, stable using similar measurement technique. No pathologically enlarged lymph nodes in the abdomen or pelvis. Reproductive: Mildly enlarged prostate. Other: No pneumoperitoneum, ascites or focal fluid collection. Musculoskeletal: Predominantly lytic posterior left acetabular 3.1 cm lesion (series 2/image 120), increased from 1.7 cm. Chronic bilateral L5 pars defects with severe degenerative disc disease and 10 mm anterolisthesis at L5-S1.  IMPRESSION: 1. Numerous small solid pulmonary nodules scattered throughout both lungs, increased in size and number, compatible with progressive pulmonary metastases. 2. New mild AP window adenopathy, suspicious for nodal metastasis. 3. Bilateral adrenal metastases are slightly increased in size. 4. Predominantly lytic posterior left acetabular 3.1 cm metastasis is increased in size. Increased sclerosis/conspicuity of left posterior element T9 and T5 sclerotic osseous lesions. Findings are compatible with progressive osseous  metastases. 5. Stable 4.8 cm infrarenal abdominal aortic aneurysm. Abdominal Aortic Aneurysm (ICD10-I71.9). Recommend follow-up CTA of the abdomen and pelvis in 6 months and vascular surgery referral/consultation. This recommendation follows ACR consensus guidelines: White Paper of the ACR Incidental Findings Committee II on Vascular Findings. J Am Coll Radiol 2013; 10:789-794. 6. Aortic Atherosclerosis (ICD10-I70.0). Additional chronic findings as detailed. Electronically Signed   By: Ilona Sorrel M.D.   On: 04/17/2019 15:53   CT Chest Wo Contrast  Result Date: 04/17/2019 CLINICAL DATA:  Non-small cell left upper lobe lung cancer initially treated with chemotherapy and radiation therapy completed May 2020, with recurrent metastatic disease to the bones and adrenal glands. Restaging. EXAM: CT CHEST, ABDOMEN AND PELVIS WITHOUT CONTRAST TECHNIQUE: Multidetector CT imaging of the chest, abdomen and pelvis was performed following the standard protocol without IV contrast. COMPARISON:  01/22/2019 PET-CT. 01/13/2019 CT chest, abdomen and pelvis. FINDINGS: CT CHEST FINDINGS Cardiovascular: Normal heart size. No significant pericardial effusion/thickening. Three-vessel coronary atherosclerosis. Right internal jugular Port-A-Cath terminates at the cavoatrial junction. Atherosclerotic nonaneurysmal thoracic aorta. Normal caliber pulmonary arteries. Mediastinum/Nodes: No discrete thyroid nodules. Unremarkable esophagus. No axillary adenopathy. Newly enlarged 1.1 cm AP window node (series 2/image 27). No additional pathologically enlarged mediastinal nodes. No discrete hilar adenopathy on these noncontrast images. Lungs/Pleura: No pneumothorax. No pleural effusion. Moderate centrilobular and paraseptal emphysema with mild diffuse bronchial wall thickening. Numerous (greater than 20) small solid pulmonary nodules scattered throughout both lungs, increased in size and number. For example a 5 mm posterior right upper lobe  nodule (series 6/image 61), previously 2 mm on 01/13/2019 CT. Right middle lobe 6 mm nodule (series 6/image 86), increased from 2 mm. Left lower lobe 4 mm nodule (series 6/image 98), increased from 1 mm. Sharply marginated left parahilar consolidation with mild volume loss and distortion, compatible with radiation change. Musculoskeletal: Left posterior element T9 and less discrete T5 sclerotic osseous lesions, increased in sclerosis (series 5/images 88 and 89). Mild thoracic spondylosis. CT ABDOMEN PELVIS FINDINGS Hepatobiliary: Normal liver with no liver mass. Normal gallbladder with no radiopaque cholelithiasis. No biliary ductal dilatation. Pancreas: Normal, with no mass or duct dilation. Spleen: Normal size. No mass. Adrenals/Urinary Tract: Right adrenal 3.6 cm mass, previously 3.4 cm, slightly increased. Left adrenal 3.3 cm mass, previously 2.8 cm, slightly increased. No hydronephrosis. No renal stones. No contour deforming renal masses. Normal bladder. Stomach/Bowel: Normal non-distended stomach. Normal caliber small bowel with no small bowel wall thickening. Normal appendix. Oral contrast transits to the colon. Normal large bowel with no diverticulosis, large bowel wall thickening or pericolonic fat stranding. Vascular/Lymphatic: Atherosclerotic abdominal aorta with 4.8 cm infrarenal abdominal aortic aneurysm, stable using similar measurement technique. No pathologically enlarged lymph nodes in the abdomen or pelvis. Reproductive: Mildly enlarged prostate. Other: No pneumoperitoneum, ascites or focal fluid collection. Musculoskeletal: Predominantly lytic posterior left acetabular 3.1 cm lesion (series 2/image 120), increased from 1.7 cm. Chronic bilateral L5 pars defects with severe degenerative disc disease and 10 mm anterolisthesis at L5-S1. IMPRESSION: 1. Numerous small solid pulmonary nodules scattered throughout both lungs, increased in size and number, compatible  with progressive pulmonary metastases.  2. New mild AP window adenopathy, suspicious for nodal metastasis. 3. Bilateral adrenal metastases are slightly increased in size. 4. Predominantly lytic posterior left acetabular 3.1 cm metastasis is increased in size. Increased sclerosis/conspicuity of left posterior element T9 and T5 sclerotic osseous lesions. Findings are compatible with progressive osseous metastases. 5. Stable 4.8 cm infrarenal abdominal aortic aneurysm. Abdominal Aortic Aneurysm (ICD10-I71.9). Recommend follow-up CTA of the abdomen and pelvis in 6 months and vascular surgery referral/consultation. This recommendation follows ACR consensus guidelines: White Paper of the ACR Incidental Findings Committee II on Vascular Findings. J Am Coll Radiol 2013; 10:789-794. 6. Aortic Atherosclerosis (ICD10-I70.0). Additional chronic findings as detailed. Electronically Signed   By: Ilona Sorrel M.D.   On: 04/17/2019 15:53   NM Pulmonary Perfusion  Result Date: 04/18/2019 CLINICAL DATA:  PE suspected, low to intermediate probability, negative D-dimer EXAM: NUCLEAR MEDICINE PERFUSION LUNG SCAN TECHNIQUE: Perfusion images were obtained in multiple projections after intravenous injection of radiopharmaceutical. Ventilation scans intentionally deferred if perfusion scan and chest x-ray adequate for interpretation during COVID 19 epidemic. RADIOPHARMACEUTICALS:  1.4 millicuries mCi [id number] MAA IV COMPARISON:  CT 04/17/2019, radiograph 03/20/2018 FINDINGS: Photopenic fissure sign in the left lung corresponds to the region of scarring and architectural distortion seen in the left lung on comparison CT and chest radiograph. Remaining portions of the lungs perfuse normally without segmental filling defect to suggest pulmonary artery embolism. IMPRESSION: 'Fissure' sign in the left mid lung corresponding to region of scarring and architectural distortion on CT and radiograph. No other perfusion defects to suggest pulmonary embolism. Electronically Signed   By:  Lovena Le M.D.   On: 04/18/2019 23:59   DG Chest Portable 1 View  Result Date: 04/18/2019 CLINICAL DATA:  Hypotension. Near syncope. Dizziness. EXAM: PORTABLE CHEST 1 VIEW COMPARISON:  Chest CT yesterday. FINDINGS: Right chest port remains in place. Chronic hyperinflation with emphysema. Volume loss in the left hemithorax with left perihilar opacities, stable from CT yesterday. Heart is normal in size. Aortic atherosclerosis. No acute airspace disease, pulmonary edema, pneumothorax or large pleural effusion. Pulmonary nodules on CT yesterday are not well demonstrated radiographically no acute osseous abnormalities are seen. Enteric contrast in the splenic flexure of the colon from yesterday's CT. IMPRESSION: 1. No acute findings. 2. Sequela of left lung cancer with volume loss in the left hemithorax and perihilar opacities, assessed on CT yesterday. 3. Hyperinflation and emphysema.  Aortic atherosclerosis. Aortic Atherosclerosis (ICD10-I70.0) and Emphysema (ICD10-J43.9). Electronically Signed   By: Keith Rake M.D.   On: 04/18/2019 16:05   {   Assessment and Plan:   Elevated troponin Dizziness, hypotension CAD The patient has known, moderate CAD that has not required revascularization. He has experienced intermittent hypotension and dizziness that has been attributed to his chemotherapy. He is admitted after an episode of hypotension that has resolved with IVF. He reported a high HR with this hypotension, although there is no evidence of arrhythmia. Cardiology was consulted for elevated troponin that has been stable at 108-109. He has no chest pain or ECG changes to suggest ACS. Rather, it is likely that his elevated troponin is due to demand ischemia in the setting of his progressive malignancy, hypotension, and anemia (Hgb 6.5 today). It is therefore unlikely that he would benefit from IV heparin.  -Continue treatment of anemia -Hold BP-reducing agents including metoprolol -Continue to  monitor on telemetry. Ambulatory ECG monitor could be considered to evaluate for paroxysmal arrhythmia. -Repeat echocardiogram is pending to evaluate LV  function in the setting of chemotherapy -Continue clopidogrel for CAD if bleeding risk determined to be low by primary team. -Continue statin   PAD S/p bilateral iliac stenting and has a known abdominal aortic aneurysm at 4 cm followed by vascular surgery. -Continue clopidogrel for CAD if bleeding risk determined to be low by primary team.  HLD -Continue statin       For questions or updates, please contact [facility] Please consult [email or web address] for contact info under     Signed, Nila Nephew, MD  04/19/2019 5:00 AM

## 2019-04-19 NOTE — Progress Notes (Signed)
After pager provider Berna Spare with critical, Provider stated they wanted pt started on 1 unit of blood. After not seeing any orders, around 6am, nurse sent a follow up page to provider about blood orders. Provider asked if nurse could put in order. Nurse paged provider, made him aware that nurse did not feel comfortable putting that order in and wanted provider to put order, to ensure it was done the way he wished. Waiting for blood orders so pt can sign consent.

## 2019-04-19 NOTE — Progress Notes (Signed)
Nurse paged to make provider M. Humphrey Rolls away of pt critical lab value. Hemoglobin is 6.5. Awaiting response from provider.

## 2019-04-19 NOTE — Progress Notes (Addendum)
PROGRESS NOTE  Seth [name] IEP:[id number] DOB: April 26, 1945 DOA: 04/18/2019 PCP: [name], MD   LOS: 0 days   Brief narrative: As per HPI,  Seth [name] is a 74 y.o. male with medical history significant of CAD, emphysema and stg 4 NSCCL undergoing chemotherapy. For several weeks he has had intermittent episodes of hypotension and feelings of being light-headed. He had recurrent symptoms so he presented to [facility] for evaluation. He denied any chest pain, increased SOB/DOE, fever or other symptoms. ED Course: Hemodynamically stable with normal BP. Lab work revealed Hgb 8.4, (down from 9.0 on 04/13/19), glucose 120, Troponin #1 108, Troponin #2 109, EKG normal. He saw Dr. Radford Pax for Cardiology 3/1 and was stable. He saw Dr. Nyoka Cowden, PCP, 3/10 and was stable. He is referred to [facility] for admission due to elevated troponin levels. There is also a concern for potential chronic PEs in light of his cancer diagnosis and symptoms.  Assessment/Plan:  Principal Problem:   Dizziness Active Problems:   Essential hypertension, benign   Pure hypercholesterolemia   Adenocarcinoma of left lung, stage 2 (HCC)   Hypotension   Elevated troponin level   Anemia  Hypotension with history of intermittent dizziness.  Patient has been seen by cardiology.  Negative for orthostatic x1.  Will continue.  A.m. cortisol of 13.7.  In the normal reference range.  History of adrenal mets.  Will get physical therapy evaluation.  Significant anemia.  Likely delusional with anemia of chronic disease from history of lung cancer..  No acute blood loss reported.  Check FOBT.  Will transfuse 1 unit of packed RBC today.  Spoke with the patient about it.  Add iron profile as well.  Elevated Troponin -no chest pain at this time.  Seen by cardiology.  Likely demand ischemia.  Previous Cath 2014 40% LAD, 30-40% RCA. Nuc Stress 04/2018 - negative for ischemia. Echo 04/2018 nl study.  EKG is normal at this time.  Will resume Plavix at  this time.  Mild hypokalemia on presentation.  Will replace.  Check BMP in a.m.           History of lung cancer.  Undergoing chemotherapy treatment.  Next treatment on 04/21/2019.  VQ scan was performed which was negative for PE. He has dye allergy and steroid allergy so not a candidate for CTA-chest.  Patient was on full dose of heparin which will be discontinued at this time.  Mild hypomagnesemia.  Will replenish with oral magnesium oxide.  VTE Prophylaxis: Hold for now.  SCD.  Code Status: Full code  Family Communication: None  Disposition Plan:   Patient is from home  Likely disposition to home likely in 1 to 2 days  Barriers to discharge: Needing blood transfusion, PT evaluation, electrolyte replacement.  Will change patient status to inpatient at this time due to need for blood transfusion, further evaluation and treatment.  Consultants:  Cardiology  Procedures:  Blood transfusion 1 unit  Antibiotics:   None  Anti-infectives (From admission, onward)   None     Subjective: Today, patient was seen and examined at bedside.  Denies any dizziness, lightheadedness chest pain shortness of breath.  Fatigued and weak.  Objective: Vitals:   04/18/19 2059 04/19/19 0508  BP: 140/81 110/61  Pulse: (!) 107 88  Resp: 18 18  Temp: 98.3 F (36.8 C) (!) 97.5 F (36.4 C)  SpO2: 100% 95%    Intake/Output Summary (Last 24 hours) at 04/19/2019 1051 Last data filed at 04/19/2019 6606  Gross per 24 hour  Intake 767.63 ml  Output 325 ml  Net 442.63 ml   Filed Weights   04/18/19 1347 04/18/19 2052  Weight: 82.6 kg 79.9 kg   Body mass index is 23.25 kg/m.   Physical Exam: GENERAL: Patient is alert awake and oriented. Not in obvious distress. HENT: No scleral pallor or icterus. Pupils equally reactive to light. Oral mucosa is moist NECK: is supple, no gross swelling noted. CHEST: Clear to auscultation. No crackles or wheezes.  Diminished breath sounds bilaterally.   Right chest wall Port-A-Cath in place CVS: S1 and S2 heard, no murmur. Regular rate and rhythm.  ABDOMEN: Soft, non-tender, bowel sounds are present. EXTREMITIES: No edema. CNS: Cranial nerves are intact. No focal motor deficits. SKIN: warm and dry without rashes.  Data Review: I have personally reviewed the following laboratory data and studies,  CBC: Recent Labs  Lab 04/14/19 1227 04/18/19 1531 04/19/19 0401  WBC 3.2* 4.5 2.9*  NEUTROABS 2.0 3.3  --   HGB 9.0* 8.4* 6.5*  HCT 27.0* 25.8* 19.7*  MCV 89.1 89.9 91.2  PLT 69* 123* 440*   Basic Metabolic Panel: Recent Labs  Lab 04/14/19 1227 04/18/19 1531  NA 137 133*  K 3.3* 3.3*  CL 100 98  CO2 25 24  GLUCOSE 109* 120*  BUN 12 14  CREATININE 0.99 0.94  CALCIUM 8.5* 8.7*  MG  --  1.4*   Liver Function Tests: Recent Labs  Lab 04/14/19 1227  AST 25  ALT 21  ALKPHOS 139*  BILITOT 0.9  PROT 7.0  ALBUMIN 3.6   No results for input(s): LIPASE, AMYLASE in the last 168 hours. No results for input(s): AMMONIA in the last 168 hours. Cardiac Enzymes: No results for input(s): CKTOTAL, CKMB, CKMBINDEX, TROPONINI in the last 168 hours. BNP (last 3 results) No results for input(s): BNP in the last 8760 hours.  ProBNP (last 3 results) No results for input(s): PROBNP in the last 8760 hours.  CBG: No results for input(s): GLUCAP in the last 168 hours. Recent Results (from the past 240 hour(s))  SARS CORONAVIRUS 2 (TAT 6-24 HRS) Nasopharyngeal Nasopharyngeal Swab     Status: None   Collection Time: 04/18/19  7:09 PM   Specimen: Nasopharyngeal Swab  Result Value Ref Range Status   SARS Coronavirus 2 NEGATIVE NEGATIVE Final    Comment: (NOTE) SARS-CoV-2 target nucleic acids are NOT DETECTED. The SARS-CoV-2 RNA is generally detectable in upper and lower respiratory specimens during the acute phase of infection. Negative results do not preclude SARS-CoV-2 infection, do not rule out co-infections with other pathogens, and  should not be used as the sole basis for treatment or other patient management decisions. Negative results must be combined with clinical observations, patient history, and epidemiological information. The expected result is Negative. Fact Sheet for Patients: SugarRoll.be Fact Sheet for Healthcare Providers: https://www.woods-mathews.com/ This test is not yet approved or cleared by the [place] FDA and  has been authorized for detection and/or diagnosis of SARS-CoV-2 by FDA under an Emergency Use Authorization (EUA). This EUA will remain  in effect (meaning this test can be used) for the duration of the COVID-19 declaration under Section 56 4(b)(1) of the Act, 21 U.S.C. section 360bbb-3(b)(1), unless the authorization is terminated or revoked sooner. Performed at [facility], [place] [address] [place], [place] [address]      Studies: CT Abdomen Pelvis Wo Contrast  Result Date: 04/17/2019 CLINICAL DATA:  Non-small cell left upper lobe lung  cancer initially treated with chemotherapy and radiation therapy completed May 2020, with recurrent metastatic disease to the bones and adrenal glands. Restaging. EXAM: CT CHEST, ABDOMEN AND PELVIS WITHOUT CONTRAST TECHNIQUE: Multidetector CT imaging of the chest, abdomen and pelvis was performed following the standard protocol without IV contrast. COMPARISON:  01/22/2019 PET-CT. 01/13/2019 CT chest, abdomen and pelvis. FINDINGS: CT CHEST FINDINGS Cardiovascular: Normal heart size. No significant pericardial effusion/thickening. Three-vessel coronary atherosclerosis. Right internal jugular Port-A-Cath terminates at the cavoatrial junction. Atherosclerotic nonaneurysmal thoracic aorta. Normal caliber pulmonary arteries. Mediastinum/Nodes: No discrete thyroid nodules. Unremarkable esophagus. No axillary adenopathy. Newly enlarged 1.1 cm AP window node (series 2/image 27). No additional pathologically enlarged  mediastinal nodes. No discrete hilar adenopathy on these noncontrast images. Lungs/Pleura: No pneumothorax. No pleural effusion. Moderate centrilobular and paraseptal emphysema with mild diffuse bronchial wall thickening. Numerous (greater than 20) small solid pulmonary nodules scattered throughout both lungs, increased in size and number. For example a 5 mm posterior right upper lobe nodule (series 6/image 61), previously 2 mm on 01/13/2019 CT. Right middle lobe 6 mm nodule (series 6/image 86), increased from 2 mm. Left lower lobe 4 mm nodule (series 6/image 98), increased from 1 mm. Sharply marginated left parahilar consolidation with mild volume loss and distortion, compatible with radiation change. Musculoskeletal: Left posterior element T9 and less discrete T5 sclerotic osseous lesions, increased in sclerosis (series 5/images 88 and 89). Mild thoracic spondylosis. CT ABDOMEN PELVIS FINDINGS Hepatobiliary: Normal liver with no liver mass. Normal gallbladder with no radiopaque cholelithiasis. No biliary ductal dilatation. Pancreas: Normal, with no mass or duct dilation. Spleen: Normal size. No mass. Adrenals/Urinary Tract: Right adrenal 3.6 cm mass, previously 3.4 cm, slightly increased. Left adrenal 3.3 cm mass, previously 2.8 cm, slightly increased. No hydronephrosis. No renal stones. No contour deforming renal masses. Normal bladder. Stomach/Bowel: Normal non-distended stomach. Normal caliber small bowel with no small bowel wall thickening. Normal appendix. Oral contrast transits to the colon. Normal large bowel with no diverticulosis, large bowel wall thickening or pericolonic fat stranding. Vascular/Lymphatic: Atherosclerotic abdominal aorta with 4.8 cm infrarenal abdominal aortic aneurysm, stable using similar measurement technique. No pathologically enlarged lymph nodes in the abdomen or pelvis. Reproductive: Mildly enlarged prostate. Other: No pneumoperitoneum, ascites or focal fluid collection.  Musculoskeletal: Predominantly lytic posterior left acetabular 3.1 cm lesion (series 2/image 120), increased from 1.7 cm. Chronic bilateral L5 pars defects with severe degenerative disc disease and 10 mm anterolisthesis at L5-S1. IMPRESSION: 1. Numerous small solid pulmonary nodules scattered throughout both lungs, increased in size and number, compatible with progressive pulmonary metastases. 2. New mild AP window adenopathy, suspicious for nodal metastasis. 3. Bilateral adrenal metastases are slightly increased in size. 4. Predominantly lytic posterior left acetabular 3.1 cm metastasis is increased in size. Increased sclerosis/conspicuity of left posterior element T9 and T5 sclerotic osseous lesions. Findings are compatible with progressive osseous metastases. 5. Stable 4.8 cm infrarenal abdominal aortic aneurysm. Abdominal Aortic Aneurysm (ICD10-I71.9). Recommend follow-up CTA of the abdomen and pelvis in 6 months and vascular surgery referral/consultation. This recommendation follows ACR consensus guidelines: White Paper of the ACR Incidental Findings Committee II on Vascular Findings. J Am Coll Radiol 2013; 10:789-794. 6. Aortic Atherosclerosis (ICD10-I70.0). Additional chronic findings as detailed. Electronically Signed   By: Ilona Sorrel M.D.   On: 04/17/2019 15:53   CT Chest Wo Contrast  Result Date: 04/17/2019 CLINICAL DATA:  Non-small cell left upper lobe lung cancer initially treated with chemotherapy and radiation therapy completed May 2020, with recurrent metastatic disease to the  bones and adrenal glands. Restaging. EXAM: CT CHEST, ABDOMEN AND PELVIS WITHOUT CONTRAST TECHNIQUE: Multidetector CT imaging of the chest, abdomen and pelvis was performed following the standard protocol without IV contrast. COMPARISON:  01/22/2019 PET-CT. 01/13/2019 CT chest, abdomen and pelvis. FINDINGS: CT CHEST FINDINGS Cardiovascular: Normal heart size. No significant pericardial effusion/thickening. Three-vessel  coronary atherosclerosis. Right internal jugular Port-A-Cath terminates at the cavoatrial junction. Atherosclerotic nonaneurysmal thoracic aorta. Normal caliber pulmonary arteries. Mediastinum/Nodes: No discrete thyroid nodules. Unremarkable esophagus. No axillary adenopathy. Newly enlarged 1.1 cm AP window node (series 2/image 27). No additional pathologically enlarged mediastinal nodes. No discrete hilar adenopathy on these noncontrast images. Lungs/Pleura: No pneumothorax. No pleural effusion. Moderate centrilobular and paraseptal emphysema with mild diffuse bronchial wall thickening. Numerous (greater than 20) small solid pulmonary nodules scattered throughout both lungs, increased in size and number. For example a 5 mm posterior right upper lobe nodule (series 6/image 61), previously 2 mm on 01/13/2019 CT. Right middle lobe 6 mm nodule (series 6/image 86), increased from 2 mm. Left lower lobe 4 mm nodule (series 6/image 98), increased from 1 mm. Sharply marginated left parahilar consolidation with mild volume loss and distortion, compatible with radiation change. Musculoskeletal: Left posterior element T9 and less discrete T5 sclerotic osseous lesions, increased in sclerosis (series 5/images 88 and 89). Mild thoracic spondylosis. CT ABDOMEN PELVIS FINDINGS Hepatobiliary: Normal liver with no liver mass. Normal gallbladder with no radiopaque cholelithiasis. No biliary ductal dilatation. Pancreas: Normal, with no mass or duct dilation. Spleen: Normal size. No mass. Adrenals/Urinary Tract: Right adrenal 3.6 cm mass, previously 3.4 cm, slightly increased. Left adrenal 3.3 cm mass, previously 2.8 cm, slightly increased. No hydronephrosis. No renal stones. No contour deforming renal masses. Normal bladder. Stomach/Bowel: Normal non-distended stomach. Normal caliber small bowel with no small bowel wall thickening. Normal appendix. Oral contrast transits to the colon. Normal large bowel with no diverticulosis, large  bowel wall thickening or pericolonic fat stranding. Vascular/Lymphatic: Atherosclerotic abdominal aorta with 4.8 cm infrarenal abdominal aortic aneurysm, stable using similar measurement technique. No pathologically enlarged lymph nodes in the abdomen or pelvis. Reproductive: Mildly enlarged prostate. Other: No pneumoperitoneum, ascites or focal fluid collection. Musculoskeletal: Predominantly lytic posterior left acetabular 3.1 cm lesion (series 2/image 120), increased from 1.7 cm. Chronic bilateral L5 pars defects with severe degenerative disc disease and 10 mm anterolisthesis at L5-S1. IMPRESSION: 1. Numerous small solid pulmonary nodules scattered throughout both lungs, increased in size and number, compatible with progressive pulmonary metastases. 2. New mild AP window adenopathy, suspicious for nodal metastasis. 3. Bilateral adrenal metastases are slightly increased in size. 4. Predominantly lytic posterior left acetabular 3.1 cm metastasis is increased in size. Increased sclerosis/conspicuity of left posterior element T9 and T5 sclerotic osseous lesions. Findings are compatible with progressive osseous metastases. 5. Stable 4.8 cm infrarenal abdominal aortic aneurysm. Abdominal Aortic Aneurysm (ICD10-I71.9). Recommend follow-up CTA of the abdomen and pelvis in 6 months and vascular surgery referral/consultation. This recommendation follows ACR consensus guidelines: White Paper of the ACR Incidental Findings Committee II on Vascular Findings. J Am Coll Radiol 2013; 10:789-794. 6. Aortic Atherosclerosis (ICD10-I70.0). Additional chronic findings as detailed. Electronically Signed   By: Ilona Sorrel M.D.   On: 04/17/2019 15:53   NM Pulmonary Perfusion  Result Date: 04/18/2019 CLINICAL DATA:  PE suspected, low to intermediate probability, negative D-dimer EXAM: NUCLEAR MEDICINE PERFUSION LUNG SCAN TECHNIQUE: Perfusion images were obtained in multiple projections after intravenous injection of  radiopharmaceutical. Ventilation scans intentionally deferred if perfusion scan and chest x-ray adequate for interpretation  during Ruhenstroth 19 epidemic. RADIOPHARMACEUTICALS:  1.4 millicuries mCi [id number] MAA IV COMPARISON:  CT 04/17/2019, radiograph 03/20/2018 FINDINGS: Photopenic fissure sign in the left lung corresponds to the region of scarring and architectural distortion seen in the left lung on comparison CT and chest radiograph. Remaining portions of the lungs perfuse normally without segmental filling defect to suggest pulmonary artery embolism. IMPRESSION: 'Fissure' sign in the left mid lung corresponding to region of scarring and architectural distortion on CT and radiograph. No other perfusion defects to suggest pulmonary embolism. Electronically Signed   By: Lovena Le M.D.   On: 04/18/2019 23:59   DG Chest Portable 1 View  Result Date: 04/18/2019 CLINICAL DATA:  Hypotension. Near syncope. Dizziness. EXAM: PORTABLE CHEST 1 VIEW COMPARISON:  Chest CT yesterday. FINDINGS: Right chest port remains in place. Chronic hyperinflation with emphysema. Volume loss in the left hemithorax with left perihilar opacities, stable from CT yesterday. Heart is normal in size. Aortic atherosclerosis. No acute airspace disease, pulmonary edema, pneumothorax or large pleural effusion. Pulmonary nodules on CT yesterday are not well demonstrated radiographically no acute osseous abnormalities are seen. Enteric contrast in the splenic flexure of the colon from yesterday's CT. IMPRESSION: 1. No acute findings. 2. Sequela of left lung cancer with volume loss in the left hemithorax and perihilar opacities, assessed on CT yesterday. 3. Hyperinflation and emphysema.  Aortic atherosclerosis. Aortic Atherosclerosis (ICD10-I70.0) and Emphysema (ICD10-J43.9). Electronically Signed   By: Keith Rake M.D.   On: 04/18/2019 16:05      Flora Lipps, MD  [facility] 04/19/2019

## 2019-04-20 ENCOUNTER — Other Ambulatory Visit (HOSPITAL_COMMUNITY): Payer: Medicare Other

## 2019-04-20 ENCOUNTER — Telehealth: Payer: Self-pay | Admitting: Internal Medicine

## 2019-04-20 ENCOUNTER — Telehealth: Payer: Self-pay | Admitting: Medical Oncology

## 2019-04-20 DIAGNOSIS — C3492 Malignant neoplasm of unspecified part of left bronchus or lung: Secondary | ICD-10-CM

## 2019-04-20 LAB — BASIC METABOLIC PANEL
Anion gap: 7 (ref 5–15)
BUN: 9 mg/dL (ref 8–23)
CO2: 25 mmol/L (ref 22–32)
Calcium: 8.2 mg/dL — ABNORMAL LOW (ref 8.9–10.3)
Chloride: 103 mmol/L (ref 98–111)
Creatinine, Ser: 0.84 mg/dL (ref 0.61–1.24)
GFR calc Af Amer: >60 mL/min (ref >60)
GFR calc non Af Amer: >60 mL/min (ref >60)
Glucose, Bld: 112 mg/dL — ABNORMAL HIGH (ref 70–99)
Potassium: 3.6 mmol/L (ref 3.5–5.1)
Sodium: 135 mmol/L (ref 135–145)

## 2019-04-20 LAB — CBC
HCT: 24.5 % — ABNORMAL LOW (ref 39.0–52.0)
Hemoglobin: 7.9 g/dL — ABNORMAL LOW (ref 13.0–17.0)
MCH: 29.5 pg (ref 26.0–34.0)
MCHC: 32.2 g/dL (ref 30.0–36.0)
MCV: 91.4 fL (ref 80.0–100.0)
Platelets: 137 10*3/uL — ABNORMAL LOW (ref 150–400)
RBC: 2.68 MIL/uL — ABNORMAL LOW (ref 4.22–5.81)
RDW: 18 % — ABNORMAL HIGH (ref 11.5–15.5)
WBC: 2.8 10*3/uL — ABNORMAL LOW (ref 4.0–10.5)
nRBC: 0 % (ref 0.0–0.2)

## 2019-04-20 LAB — TYPE AND SCREEN
ABO/RH(D): O POS
Antibody Screen: NEGATIVE
Unit division: 0

## 2019-04-20 LAB — MAGNESIUM: Magnesium: 1.3 mg/dL — ABNORMAL LOW (ref 1.7–2.4)

## 2019-04-20 LAB — BPAM RBC
Blood Product Expiration Date: 202104112359
ISSUE DATE / TIME: 202103141231
Unit Type and Rh: 5100

## 2019-04-20 MED ORDER — HEPARIN SOD (PORK) LOCK FLUSH 100 UNIT/ML IV SOLN
500.0000 [IU] | INTRAVENOUS | Status: DC
  Administered 2019-04-20: 500 [IU]
  Filled 2019-04-20: qty 5

## 2019-04-20 MED ORDER — POLYETHYLENE GLYCOL 3350 17 G PO PACK: 17.0000 g | PACK | Freq: Every day | ORAL | 0 refills | Status: AC | PRN

## 2019-04-20 MED ORDER — MAGNESIUM SULFATE 2 GM/50ML IV SOLN
2.0000 g | Freq: Once | INTRAVENOUS | Status: AC
  Administered 2019-04-20: 2 g via INTRAVENOUS
  Filled 2019-04-20: qty 50

## 2019-04-20 MED ORDER — HEPARIN SOD (PORK) LOCK FLUSH 100 UNIT/ML IV SOLN
500.0000 [IU] | INTRAVENOUS | Status: DC | PRN
  Filled 2019-04-20: qty 5

## 2019-04-20 MED ORDER — MAGNESIUM OXIDE 400 (241.3 MG) MG PO TABS: 400.0000 mg | ORAL_TABLET | Freq: Two times a day (BID) | ORAL | 0 refills | Status: AC

## 2019-04-20 NOTE — Progress Notes (Signed)
Initial Nutrition Assessment  DOCUMENTATION CODES:   Non-severe (moderate) malnutrition in context of chronic illness  INTERVENTION:  - will order oral nutrition supplements if patient unable to d/c today.  - provided patient's wife with handouts: High Magnesium Foods list and High Potassium Foods list.    NUTRITION DIAGNOSIS:   Moderate Malnutrition related to chronic illness, cancer and cancer related treatments as evidenced by moderate fat depletion, moderate muscle depletion.  GOAL:   Patient will meet greater than or equal to 90% of their needs  MONITOR:   PO intake, Supplement acceptance, Labs, Weight trends  REASON FOR ASSESSMENT:   Malnutrition Screening Tool  ASSESSMENT:   74 y.o. male with medical history of CAD, emphysema, and stage 4 NSCCL undergoing chemotherapy. For several weeks he has had intermittent episodes of hypotension and feelings of being light-headed. He had recurrent symptoms so he presented to [facility] ED for evaluation. He is referred to [facility] for admission due to elevated troponin levels. There is also a concern for potential chronic PEs.  No intakes documented since admission. Able to talk with patient and wife, who was at bedside. Patient is planned to have next chemo on 3/16 (tomorrow) and sees Dr. Julien Nordmann. Patient used to be a heavy smoker and began losing his sense of smell a few years ago. He is also experiencing lack of taste and cannot taste anything except for salty or vinegary items. His favorite thing to consume is lettuce with a vinegar dressing because he can taste it. He has a lack of interest in eating and has a very poor appetite. He may eat a bowl of cereal for breakfast and a half a sandwich for lunch. He has been experiencing nausea and copious amounts of mucus which also contribute to lack of desire to eat and also lead to vomiting with intakes. In the past (RD unsure of when) he had a hiatal hernia which caused vomiting with intakes d/t  pressure.   He has been experiencing severe constipation and it is not uncommon for him to go several days without a BM. He has not had a BM since 3/12. Did not have the opportunity to talk about fluid intake with him and his wife. He broke his ankle in the autumn which led to decreased/very limited mobility which affected his R hip and led to weakness. He is now nervous to ambulate as it often feels his hip will give out. He does well walking with a walker but prefers not to use the walker. He has been experiencing back pain d/t prolonged periods of immobility.   Patient has also been experiencing muscle aches and weakness. On admission he had hypokalemia and hypomagnesemia. Discussed the connection between these and discussed ways to increase K and Mg intake and provided handouts of the same.   Patient reports 30 lb weight loss since 01/2019. Per chart review, weight on 3/13 was 176 lb and weight on 2/24 was 186 lb. This indicates 10 lb weight loss (5.4% body weight) in ~3 weeks; significant for time frame.   Discharge order entered today at 1414. Discharge summary not yet entered.    Labs reviewed; Ca: 8.2 mg/dl, Mg: 1.3 mg/dl. Medications reviewed; 20 mg oral pepcid BID, 400 mg mag-ox BID, 2 g IV Mg sulfate x1 run 3/15.     NUTRITION - FOCUSED PHYSICAL EXAM:    Most Recent Value  Orbital Region  Moderate depletion  Upper Arm Region  Severe depletion  Thoracic and Lumbar Region  Unable  to assess  Buccal Region  Moderate depletion  Temple Region  Mild depletion  Clavicle Bone Region  Moderate depletion  Clavicle and Acromion Bone Region  Moderate depletion  Scapular Bone Region  Moderate depletion  Dorsal Hand  Moderate depletion  Patellar Region  Unable to assess  Anterior Thigh Region  Unable to assess  Posterior Calf Region  Unable to assess  Edema (RD Assessment)  Unable to assess  Hair  Reviewed  Eyes  Reviewed  Mouth  Reviewed  Skin  Reviewed  Nails  Reviewed        Diet Order:   Diet Order            Diet - low sodium heart healthy        Diet regular Room service appropriate? Yes; Fluid consistency: Thin  Diet effective now              EDUCATION NEEDS:   Education needs have been addressed  Skin:  Skin Assessment: Reviewed RN Assessment  Last BM:  3/12  Height:   Ht Readings from Last 1 Encounters:  04/18/19 6\' 1"  (1.854 m)    Weight:   Wt Readings from Last 1 Encounters:  04/18/19 79.9 kg     Estimated Nutritional Needs:  Kcal:  2300-2500 kcal Protein:  115-125 grams Fluid:  >/= 2.5 L/day     Jarome Matin, MS, RD, LDN, CNSC Inpatient Clinical Dietitian RD pager # available in [facility]  After hours/weekend pager # available in [facility]

## 2019-04-20 NOTE — Progress Notes (Signed)
Physical Therapy Treatment Patient Details Name: Seth [name] MRN: [id number] DOB: 06/06/1945 Today's Date: 04/20/2019    History of Present Illness 74 year old male in observation with "dizziness", and needing a blood transfusion for anemia. PMH includes CAD, Stage 4 NSCCL. Hypotension, increased troponin level , and potential for chronic PEs.    PT Comments    Patient resting in bed when PT arrived. Encouraged to sit up in chair to eat breakfast. He was able to perform all bed mobility with assist of one, min guard-min assist. He does have difficulty with log rolling. 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 He also reports chronic sinusitis- with complaints of feeling lightheaded- and rubbing forehead with hand. He was reminded to perform LEs in accordance with HEP- and which are also in accordance with the ex format that the outpatient PT has provided for his left ankle (fractured in October). He anticipated going home today, but it is unclear whether this is definite at this point. Should continue to benefit from PT while hospitalized and he is receptive to PT.   Follow Up Recommendations  Outpatient PT     Equipment Recommendations       Recommendations for Other Services       Precautions / Restrictions Precautions Precautions: Fall Precaution Comments: Monitor closely- skin and joint integrity, also s/s of orthstasis. Restrictions Weight Bearing Restrictions: No Other Position/Activity Restrictions: He indicates that he has chronic sinusitis- and tends to feel lighted- but this occurs even when he is just sitting and not just from sit<>stand    Mobility  Bed Mobility   Bed Mobility: Rolling           General bed mobility comments: He has difficulty performing log rolling- but should be encouraged to do this to protect his back  Transfers Overall transfer level:  Needs assistance Equipment used: 1 person hand held assist Transfers: Sit to/from Stand Sit to Stand: Min guard         General transfer comment: Recommend RW for distance walking other than bed<>BS chair  Ambulation/Gait Ambulation/Gait assistance: Min guard Gait Distance (Feet): 7 Feet Assistive device: 1 person hand held assist Gait Pattern/deviations: Shuffle;Wide base of support   Gait velocity interpretation: 1.31 - 2.62 ft/sec, indicative of limited community Metallurgist Rankin (Stroke Patients Only)       Balance Overall balance assessment: Needs assistance Sitting-balance support: No upper extremity supported Sitting balance-Leahy Scale: Good     Standing balance support: Single extremity supported Standing balance-Leahy Scale: Fair                              Cognition Arousal/Alertness: Awake/alert   Overall Cognitive Status: Within Functional Limits for tasks assessed                                 General Comments: wife present visiting and presents as healthy and supportive- his primary caregiver      Exercises General Exercises - Lower Extremity Ankle Circles/Pumps: Seated;AROM;Both;10 reps Quad Sets: Seated;AROM;Both;10 reps Gluteal Sets: Seated;AROM;Both;10 reps Short Arc Quad: Seated;AROM;Both;10 reps Long Arc Quad: Both;10 reps Heel Slides:  Seated;AROM;Both;10 reps Hip ABduction/ADduction: Seated;AROM;Both;10 reps Other Exercises Other Exercises: good return demo on LE and printed ex sheet issued. Also instructed in UE flexion stretching , and abduction. Has yellow and orange theraband at home. Takes PT 2 x week    General Comments General comments (skin integrity, edema, etc.): monitor skin and joint integrity ; tone and turgor fair      Pertinent Vitals/Pain Pain Score: 6  Pain Location: Back- states he has a congenital deformity before L5-S1  and has a rigid brace at home that "helps a little". He reports that a pillow at his back does not help Pain Descriptors / Indicators: Aching;Pressure Pain Intervention(s): (Did report to RN.)    Home Living                      Prior Function            PT Goals (current goals can now be found in the care plan section) Acute Rehab PT Goals Patient Stated Goal: to get well and continue to get stronger with PT PT Goal Formulation: With patient Time For Goal Achievement: 05/03/19 Potential to Achieve Goals: Good(He plans to continue PT on outpatient basis as previously) Progress towards PT goals: Progressing toward goals    Frequency    Min 3X/week      PT Plan      Co-evaluation              AM-PAC PT "6 Clicks" Mobility   Outcome Measure  Help needed turning from your back to your side while in a flat bed without using bedrails?: A Little Help needed moving from lying on your back to sitting on the side of a flat bed without using bedrails?: A Little Help needed moving to and from a bed to a chair (including a wheelchair)?: A Little Help needed standing up from a chair using your arms (e.g., wheelchair or bedside chair)?: A Little Help needed to walk in hospital room?: A Little Help needed climbing 3-5 steps with a railing? : A Lot 6 Click Score: 17    End of Session   Activity Tolerance: Patient tolerated treatment well Patient left: in chair;with call bell/phone within reach   PT Visit Diagnosis: Muscle weakness (generalized) (M62.81)     Time: 1610-9604 PT Time Calculation (min) (ACUTE ONLY): 30 min  Charges:  $Therapeutic Exercise: 8-22 mins $Therapeutic Activity: 8-22 mins                    Rollen Sox, PT # [phone] CGV cell  Casandra Doffing 04/20/2019, 9:46 AM

## 2019-04-20 NOTE — Discharge Summary (Signed)
Physician Discharge Summary  Seth [name] DGL:[id number] DOB: 03-30-1945 DOA: 04/18/2019  PCP: [name], MD  Admit date: 04/18/2019 Discharge date: 04/20/2019  Admitted From: Home  Discharge disposition: Home with outpatient physical therapy   Recommendations for Outpatient Follow-Up:   . Follow up with your primary care provider in one week or as scheduled by you . Check CBC, BMP in the next visit . Follow-up with your oncologist as scheduled by the clinic.  Will need to discuss about anemia in the next visit.  Discharge Diagnosis:   Principal Problem:   Dizziness Active Problems:   Essential hypertension, benign   Pure hypercholesterolemia   Adenocarcinoma of left lung, stage 2 (HCC)   Hypotension   Elevated troponin level   Anemia   Severe anemia   Discharge Condition: Improved.  Diet recommendation: Low sodium, heart healthy.   Wound care: None.  Code status: Full.  History of Present Illness:   Seth [name] a 74 y.o.malewith medical history significant ofCAD, emphysema and stg 4 NSCCL undergoing chemotherapy. For several weeks he has had intermittent episodes of hypotension and feelings of being light-headed. He had recurrent symptoms so he presented to [facility] for evaluation. He denied any chest pain, increased SOB/DOE, fever or other symptoms. ED Course:Hemodynamically stable with normal BP. Lab work revealed Hgb 8.4, (down from 9.0 on 04/13/19), glucose 120, Troponin #1 108, Troponin #2 109, EKG normal. He saw Dr. Radford Pax for Cardiology 3/1 and was stable. He saw Dr. Nyoka Cowden, PCP, 3/10 and was stable. He is referred to [facility] for admission due to elevated troponin levels. There was also a concern for potential chronic PEs in light of his cancer diagnosis and symptoms.  Hospital Course:   Following conditions were addressed during hospitalization as listed below,  Hypotension with history of intermittent dizziness.    Secondary to volume depletion  chemotherapy and anemia.  Patient has been seen by cardiology.  Negative for orthostatic x1.  Will continue.  A.m. cortisol of 13.7 in the normal reference range.  History of adrenal mets.    Patient has been seen by physical therapy recommend outpatient physical therapy and discharge.  Patient is on metoprolol at home and will take it only when high systolic blood pressure is more than 110.  Significant anemia.    Received 1 unit of packed RBC.  Likely dilutional with anemia of chronic disease from history of lung cancer.  Iron profile reviewed.  No acute blood loss reported patient states that he sometimes passes blood when he is very constipated and has to strain.  He did have colonoscopy 2 years back which showed some polyps.  Possibility of mild hemorrhoidal bleeding.  Patient was encouraged to take stool softeners and laxatives..  Check FOBT.    Patient's wife and the patient was advised to discuss this with his hematologist on the next visit.  Elevated Troponin -no chest pain at this time.  Seen by cardiology.  Likely demand ischemia.  Previous Cath 2014 40% LAD, 30-40% RCA. Nuc Stress 04/2018 - negative for ischemia. Echo 04/2018 was within normal limits..  EKG is normal at this time.  Patient will be resumed on Plavix. VQ scan was done with low probability for PE.  Mild hypokalemia on presentation.    Improved with replacement.  History of lung cancer.  Undergoing chemotherapy treatment.  Next treatment on 04/21/2019. Patient will have to discuss with oncology about it.   VQ scan was performed which was negative for PE. He  has dye allergy and steroid allergy so not a candidate for CTA-chest.  Patient was on full dose of heparin which was discontinued.  Disposition.  At this time, patient is stable for disposition.  Spoke with the patient's wife on the phone and updated her about the clinical condition of the patient and plan for disposition and follow-up.  Medical Consultants:     Cardiology  Procedures:    VQ scan Transfusion of PRBC. Subjective:   Today, patient feels okay.  Denies any dizziness, chest pain, shortness of breath, fever, any bleeding  Discharge Exam:   Vitals:   04/20/19 1228 04/20/19 1413  BP: 133/71 117/67  Pulse: (!) 101 (!) 101  Resp: 16   Temp: 97.8 F (36.6 C)   SpO2: 96%    Vitals:   04/20/19 0502 04/20/19 0936 04/20/19 1228 04/20/19 1413  BP: 131/78  133/71 117/67  Pulse: 93  (!) 101 (!) 101  Resp: 16  16   Temp: 98.2 F (36.8 C)  97.8 F (36.6 C)   TempSrc: Oral  Oral   SpO2: 96% 97% 96%   Weight:      Height:       General: Alert awake, not in obvious distress HENT: pupils equally reacting to light, mild pallor noted.  Oral mucosa is moist.  Chest:  Clear breath sounds.  Diminished breath sounds bilaterally. No crackles or wheezes.  Right chest wall Port-A-Cath in place CVS: S1 &S2 heard. No murmur.  Regular rate and rhythm. Abdomen: Soft, nontender, nondistended.  Bowel sounds are heard.   Extremities: No cyanosis, clubbing or edema.  Peripheral pulses are palpable. Psych: Alert, awake and oriented, normal mood CNS:  No cranial nerve deficits.  Power equal in all extremities.   Skin: Warm and dry.  No rashes noted.  The results of significant diagnostics from this hospitalization (including imaging, microbiology, ancillary and laboratory) are listed below for reference.     Diagnostic Studies:   NM Pulmonary Perfusion  Result Date: 04/18/2019 CLINICAL DATA:  PE suspected, low to intermediate probability, negative D-dimer EXAM: NUCLEAR MEDICINE PERFUSION LUNG SCAN TECHNIQUE: Perfusion images were obtained in multiple projections after intravenous injection of radiopharmaceutical. Ventilation scans intentionally deferred if perfusion scan and chest x-ray adequate for interpretation during COVID 19 epidemic. RADIOPHARMACEUTICALS:  1.4 millicuries mCi RD-40C MAA IV COMPARISON:  CT 04/17/2019, radiograph  03/20/2018 FINDINGS: Photopenic fissure sign in the left lung corresponds to the region of scarring and architectural distortion seen in the left lung on comparison CT and chest radiograph. Remaining portions of the lungs perfuse normally without segmental filling defect to suggest pulmonary artery embolism. IMPRESSION: 'Fissure' sign in the left mid lung corresponding to region of scarring and architectural distortion on CT and radiograph. No other perfusion defects to suggest pulmonary embolism. Electronically Signed   By: Lovena Le M.D.   On: 04/18/2019 23:59   DG Chest Portable 1 View  Result Date: 04/18/2019 CLINICAL DATA:  Hypotension. Near syncope. Dizziness. EXAM: PORTABLE CHEST 1 VIEW COMPARISON:  Chest CT yesterday. FINDINGS: Right chest port remains in place. Chronic hyperinflation with emphysema. Volume loss in the left hemithorax with left perihilar opacities, stable from CT yesterday. Heart is normal in size. Aortic atherosclerosis. No acute airspace disease, pulmonary edema, pneumothorax or large pleural effusion. Pulmonary nodules on CT yesterday are not well demonstrated radiographically no acute osseous abnormalities are seen. Enteric contrast in the splenic flexure of the colon from yesterday's CT. IMPRESSION: 1. No acute findings. 2. Sequela of left  lung cancer with volume loss in the left hemithorax and perihilar opacities, assessed on CT yesterday. 3. Hyperinflation and emphysema.  Aortic atherosclerosis. Aortic Atherosclerosis (ICD10-I70.0) and Emphysema (ICD10-J43.9). Electronically Signed   By: Keith Rake M.D.   On: 04/18/2019 16:05     Labs:   Basic Metabolic Panel: Recent Labs  Lab 04/14/19 1227 04/14/19 1227 04/18/19 1531 04/19/19 2329 04/20/19 0754  NA 137  --  133*  --  135  K 3.3*   < > 3.3*  --  3.6  CL 100  --  98  --  103  CO2 25  --  24  --  25  GLUCOSE 109*  --  120*  --  112*  BUN 12  --  14  --  9  CREATININE 0.99  --  0.94  --  0.84  CALCIUM  8.5*  --  8.7*  --  8.2*  MG  --   --  1.4* 1.3*  --    < > = values in this interval not displayed.   GFR Estimated Creatinine Clearance: 88.5 mL/min (by C-G formula based on SCr of 0.84 mg/dL). Liver Function Tests: Recent Labs  Lab 04/14/19 1227  AST 25  ALT 21  ALKPHOS 139*  BILITOT 0.9  PROT 7.0  ALBUMIN 3.6   No results for input(s): LIPASE, AMYLASE in the last 168 hours. No results for input(s): AMMONIA in the last 168 hours. Coagulation profile No results for input(s): INR, PROTIME in the last 168 hours.  CBC: Recent Labs  Lab 04/14/19 1227 04/18/19 1531 04/19/19 0401 04/20/19 0754  WBC 3.2* 4.5 2.9* 2.8*  NEUTROABS 2.0 3.3  --   --   HGB 9.0* 8.4* 6.5* 7.9*  HCT 27.0* 25.8* 19.7* 24.5*  MCV 89.1 89.9 91.2 91.4  PLT 69* 123* 107* 137*   Cardiac Enzymes: No results for input(s): CKTOTAL, CKMB, CKMBINDEX, TROPONINI in the last 168 hours. BNP: Invalid input(s): POCBNP CBG: No results for input(s): GLUCAP in the last 168 hours. D-Dimer Recent Labs    04/18/19 1601  DDIMER 2.09*   Hgb A1c No results for input(s): HGBA1C in the last 72 hours. Lipid Profile No results for input(s): CHOL, HDL, LDLCALC, TRIG, CHOLHDL, LDLDIRECT in the last 72 hours. Thyroid function studies No results for input(s): TSH, T4TOTAL, T3FREE, THYROIDAB in the last 72 hours.  Invalid input(s): FREET3 Anemia work up Recent Labs    04/19/19 1719  FERRITIN 671*  TIBC 238*  IRON 83   Microbiology Recent Results (from the past 240 hour(s))  SARS CORONAVIRUS 2 (TAT 6-24 HRS) Nasopharyngeal Nasopharyngeal Swab     Status: None   Collection Time: 04/18/19  7:09 PM   Specimen: Nasopharyngeal Swab  Result Value Ref Range Status   SARS Coronavirus 2 NEGATIVE NEGATIVE Final    Comment: (NOTE) SARS-CoV-2 target nucleic acids are NOT DETECTED. The SARS-CoV-2 RNA is generally detectable in upper and lower respiratory specimens during the acute phase of infection. Negative results  do not preclude SARS-CoV-2 infection, do not rule out co-infections with other pathogens, and should not be used as the sole basis for treatment or other patient management decisions. Negative results must be combined with clinical observations, patient history, and epidemiological information. The expected result is Negative. Fact Sheet for Patients: SugarRoll.be Fact Sheet for Healthcare Providers: https://www.woods-mathews.com/ This test is not yet approved or cleared by the [place] FDA and  has been authorized for detection and/or diagnosis of SARS-CoV-2 by FDA under an  Emergency Use Authorization (EUA). This EUA will remain  in effect (meaning this test can be used) for the duration of the COVID-19 declaration under Section 56 4(b)(1) of the Act, 21 U.S.C. section 360bbb-3(b)(1), unless the authorization is terminated or revoked sooner. Performed at [facility], [place] [address], [place], [place] [address]      Discharge Instructions:   Discharge Instructions    Diet - low sodium heart healthy   Complete by: As directed    Discharge instructions   Complete by: As directed    Follow-up with your oncology physician as scheduled by you.  Please discuss about anemia in the next visit.  Please continue to take laxatives stool softeners to keep your bowels regulated.  If you experience any worsening symptoms please seek medical attention.  Follow-up with your primary care physician in 1 to 2 weeks/scheduled by you.  Increase fluid intake.   Increase activity slowly   Complete by: As directed    Take time and especially on changing positions.     Allergies as of 04/20/2019      Reactions   Biaxin [clarithromycin] Shortness Of Breath   Clarithromycin Shortness Of Breath   Iodinated Diagnostic Agents Hives   Prednisone Other (See Comments)   Patient states he turns REALLY RED all over.   Cortisone Other (See Comments)   Turns  red from breast up   Flomax [tamsulosin Hcl]    Blurred vision and lower back pain    Hepatitis B Virus Vaccines    Nerve problems States his previous doctor told him it was due to this    Iodine Hives   Dye from nuclear medicine test      Medication List    TAKE these medications   atorvastatin 40 MG tablet Commonly known as: LIPITOR Take 1 tablet (40 mg total) by mouth daily.   cetirizine 10 MG tablet Commonly known as: ZYRTEC Take 10 mg by mouth daily at 12 noon.   clopidogrel 75 MG tablet Commonly known as: PLAVIX TAKE 1 TABLET BY MOUTH EVERY DAY   famotidine 20 MG tablet Commonly known as: PEPCID Take 20 mg by mouth 2 (two) times daily.   folic acid 1 MG tablet Commonly known as: FOLVITE Take 1 tablet (1 mg total) by mouth daily.   hydroxypropyl methylcellulose / hypromellose 2.5 % ophthalmic solution Commonly known as: ISOPTO TEARS / GONIOVISC Place 1 drop into both eyes 3 (three) times daily.   lidocaine-prilocaine cream Commonly known as: EMLA Apply to Port-A-Cath site 30 minutes before infusion. What changed:   how much to take  how to take this  when to take this  reasons to take this  additional instructions   magic mouthwash Soln Take 5 mLs by mouth 4 (four) times daily as needed for mouth pain.   magnesium oxide 400 (241.3 Mg) MG tablet Commonly known as: MAG-OX Take 1 tablet (400 mg total) by mouth 2 (two) times daily.   metoprolol tartrate 25 MG tablet Commonly known as: LOPRESSOR Take 1 tablet (25 mg total) by mouth 2 (two) times daily. Only take if systolic blood pressure is >110 What changed:   when to take this  reasons to take this   polyethylene glycol 17 g packet Commonly known as: MiraLax Take 17 g by mouth daily as needed for moderate constipation.   prochlorperazine 10 MG tablet Commonly known as: COMPAZINE Take 1 tablet (10 mg total) by mouth every 6 (six) hours as needed for  nausea or vomiting.   sodium chloride  0.65 % Soln nasal spray Commonly known as: OCEAN Place 1 spray into both nostrils 2 (two) times daily as needed for congestion.      Follow-up Information    [place], Crista Luria, [place] Follow up on 05/13/2019.   Specialty: Cardiology Why: Hospital follow-up April 7th at 8:45AM Contact information: [address] [place] [address] [phone]        Specialists, [place]. Schedule an appointment as soon as possible for a visit.   Specialty: Orthopedic Surgery Contact information: [facility] [place] [address] [phone]            Time coordinating discharge: 39 minutes  Signed:  Tona Qualley  [facility] 04/20/2019, 2:14 PM

## 2019-04-20 NOTE — Telephone Encounter (Signed)
appt tomorrow. If he is discharged should he come to appt?

## 2019-04-20 NOTE — Discharge Instructions (Signed)
Hypotension As your heart beats, it forces blood through your body. This force is called blood pressure. If you have hypotension, you have low blood pressure. When your blood pressure is too low, you may not get enough blood to your brain or other parts of your body. This may cause you to feel weak, light-headed, have a fast heartbeat, or even pass out (faint). Low blood pressure may be harmless, or it may cause serious problems. What are the causes?  Blood loss.  Not enough water in the body (dehydration).  Heart problems.  Hormone problems.  Pregnancy.  A very bad infection.  Not having enough of certain nutrients.  Very bad allergic reactions.  Certain medicines. What increases the risk?  Age. The risk increases as you get older.  Conditions that affect the heart or the brain and spinal cord (central nervous system).  Taking certain medicines.  Being pregnant. What are the signs or symptoms?  Feeling: ? Weak. ? Light-headed. ? Dizzy. ? Tired (fatigued).  Blurred vision.  Fast heartbeat.  Passing out, in very bad cases. How is this treated?  Changing your diet. This may involve eating more salt (sodium) or drinking more water.  Taking medicines to raise your blood pressure.  Changing how much you take (the dosage) of some of your medicines.  Wearing compression stockings. These stockings help to prevent blood clots and reduce swelling in your legs. In some cases, you may need to go to the hospital for:  Fluid replacement. This means you will receive fluids through an IV tube.  Blood replacement. This means you will receive donated blood through an IV tube (transfusion).  Treating an infection or heart problems, if this applies.  Monitoring. You may need to be monitored while medicines that you are taking wear off. Follow these instructions at home: Eating and drinking   Drink enough fluids to keep your pee (urine) pale yellow.  Eat a healthy diet.  Follow instructions from your doctor about what you can eat or drink. A healthy diet includes: ? Fresh fruits and vegetables. ? Whole grains. ? Low-fat (lean) meats. ? Low-fat dairy products.  Eat extra salt only as told. Do not add extra salt to your diet unless your doctor tells you to.  Eat small meals often.  Avoid standing up quickly after you eat. Medicines  Take over-the-counter and prescription medicines only as told by your doctor. ? Follow instructions from your doctor about changing how much you take of your medicines, if this applies. ? Do not stop or change any of your medicines on your own. General instructions   Wear compression stockings as told by your doctor.  Get up slowly from lying down or sitting.  Avoid hot showers and a lot of heat as told by your doctor.  Return to your normal activities as told by your doctor. Ask what activities are safe for you.  Do not use any products that contain nicotine or tobacco, such as cigarettes, e-cigarettes, and chewing tobacco. If you need help quitting, ask your doctor.  Keep all follow-up visits as told by your doctor. This is important. Contact a doctor if:  You throw up (vomit).  You have watery poop (diarrhea).  You have a fever for more than 2-3 days.  You feel more thirsty than normal.  You feel weak and tired. Get help right away if:  You have chest pain.  You have a fast or uneven heartbeat.  You lose feeling (have numbness) in any  part of your body.  You cannot move your arms or your legs.  You have trouble talking.  You get sweaty or feel light-headed.  You pass out.  You have trouble breathing.  You have trouble staying awake.  You feel mixed up (confused). Summary  Hypotension is also called low blood pressure. It is when the force of blood pumping through your arteries is too weak.  Hypotension may be harmless, or it may cause serious problems.  Treatment may include changing  your diet and medicines, and wearing compression stockings.  In very bad cases, you may need to go to the hospital. This information is not intended to replace advice given to you by your health care provider. Make sure you discuss any questions you have with your health care provider. Document Revised: 07/18/2017 Document Reviewed: 07/18/2017 Elsevier Patient Education  [place].   Hypotension As your heart beats, it forces blood through your body. Hypotension, commonly called low blood pressure, is when the force of blood pumping through your arteries is too weak. Arteries are blood vessels that carry blood from the heart throughout the body. Depending on the cause and severity, hypotension may be harmless (benign) or may cause serious problems (be critical). When blood pressure is too low, you may not get enough blood to your brain or to the rest of your organs. This can cause weakness, light-headedness, rapid heartbeat, and fainting. What are the causes? This condition may be caused by:  Blood loss.  Loss of body fluids (dehydration).  Heart problems.  Hormone (endocrine) problems.  Pregnancy.  Severe infection.  Lack of certain nutrients.  Severe allergic reactions (anaphylaxis).  Certain medicines, such as blood pressure medicine or medicines that make the body lose excess fluids (diuretics). Sometimes, hypotension may be caused by not taking medicine as directed, such as taking too much of a certain medicine. What increases the risk? The following factors may make you more likely to develop this condition:  Age. Risk increases as you get older.  Conditions that affect the heart or the central nervous system.  Taking certain medicines, such as blood pressure medicine or diuretics.  Being pregnant. What are the signs or symptoms? Common symptoms of this condition include:  Weakness.  Light-headedness.  Dizziness.  Blurred vision.  Fatigue.  Rapid  heartbeat.  Fainting, in severe cases. How is this diagnosed? This condition is diagnosed based on:  Your medical history.  Your symptoms.  Your blood pressure measurement. Your health care provider will check your blood pressure when you are: ? Lying down. ? Sitting. ? Standing. A blood pressure reading is recorded as two numbers, such as "120 over 80" (or 120/80). The first ("top") number is called the systolic pressure. It is a measure of the pressure in your arteries as your heart beats. The second ("bottom") number is called the diastolic pressure. It is a measure of the pressure in your arteries when your heart relaxes between beats. Blood pressure is measured in a unit called mm Hg. Healthy blood pressure for most adults is 120/80. If your blood pressure is below 90/60, you may be diagnosed with hypotension. Other information or tests that may be used to diagnose hypotension include:  Your other vital signs, such as your heart rate and temperature.  Blood tests.  Tilt table test. For this test, you will be safely secured to a table that moves you from a lying position to an upright position. Your heart rhythm and blood pressure will be  monitored during the test. How is this treated? Treatment for this condition may include:  Changing your diet. This may involve eating more salt (sodium) or drinking more water.  Taking medicines to raise your blood pressure.  Changing the dosage of certain medicines you are taking that might be lowering your blood pressure.  Wearing compression stockings. These stockings help to prevent blood clots and reduce swelling in your legs. In some cases, you may need to go to the hospital for:  Fluid replacement. This means you will receive fluids through an IV.  Blood replacement. This means you will receive donated blood through an IV (transfusion).  Treating an infection or heart problems, if this applies.  Monitoring. You may need to be  monitored while medicines that you are taking wear off. Follow these instructions at home: Eating and drinking   Drink enough fluid to keep your urine pale yellow.  Eat a healthy diet, and follow instructions from your health care provider about eating or drinking restrictions. A healthy diet includes: ? Fresh fruits and vegetables. ? Whole grains. ? Lean meats. ? Low-fat dairy products.  Eat extra salt only as directed. Do not add extra salt to your diet unless your health care provider told you to do that.  Eat frequent, small meals.  Avoid standing up suddenly after eating. Medicines  Take over-the-counter and prescription medicines only as told by your health care provider. ? Follow instructions from your health care provider about changing the dosage of your current medicines, if this applies. ? Do not stop or adjust any of your medicines on your own. General instructions   Wear compression stockings as told by your health care provider.  Get up slowly from lying down or sitting positions. This gives your blood pressure a chance to adjust.  Avoid hot showers and excessive heat as directed by your health care provider.  Return to your normal activities as told by your health care provider. Ask your health care provider what activities are safe for you.  Do not use any products that contain nicotine or tobacco, such as cigarettes, e-cigarettes, and chewing tobacco. If you need help quitting, ask your health care provider.  Keep all follow-up visits as told by your health care provider. This is important. Contact a health care provider if you:  Vomit.  Have diarrhea.  Have a fever for more than 2-3 days.  Feel more thirsty than usual.  Feel weak and tired. Get help right away if you:  Have chest pain.  Have a fast or irregular heartbeat.  Develop numbness in any part of your body.  Cannot move your arms or your legs.  Have trouble speaking.  Become sweaty  or feel light-headed.  Faint.  Feel short of breath.  Have trouble staying awake.  Feel confused. Summary  Hypotension is when the force of blood pumping through your arteries is too weak.  Hypotension may be harmless (benign) or may cause serious problems (be critical).  Treatment for this condition may include changing your diet, changing your medicines, and wearing compression stockings.  In some cases, you may need to go to the hospital for fluid or blood replacement. This information is not intended to replace advice given to you by your health care provider. Make sure you discuss any questions you have with your health care provider. Document Revised: 07/18/2017 Document Reviewed: 07/18/2017 Elsevier Patient Education  [place].   Hypotension As your heart beats, it forces blood through your body.  This force is called blood pressure. If you have hypotension, you have low blood pressure. When your blood pressure is too low, you may not get enough blood to your brain or other parts of your body. This may cause you to feel weak, light-headed, have a fast heartbeat, or even pass out (faint). Low blood pressure may be harmless, or it may cause serious problems. What are the causes?  Blood loss.  Not enough water in the body (dehydration).  Heart problems.  Hormone problems.  Pregnancy.  A very bad infection.  Not having enough of certain nutrients.  Very bad allergic reactions.  Certain medicines. What increases the risk?  Age. The risk increases as you get older.  Conditions that affect the heart or the brain and spinal cord (central nervous system).  Taking certain medicines.  Being pregnant. What are the signs or symptoms?  Feeling: ? Weak. ? Light-headed. ? Dizzy. ? Tired (fatigued).  Blurred vision.  Fast heartbeat.  Passing out, in very bad cases. How is this treated?  Changing your diet. This may involve eating more salt (sodium)  or drinking more water.  Taking medicines to raise your blood pressure.  Changing how much you take (the dosage) of some of your medicines.  Wearing compression stockings. These stockings help to prevent blood clots and reduce swelling in your legs. In some cases, you may need to go to the hospital for:  Fluid replacement. This means you will receive fluids through an IV tube.  Blood replacement. This means you will receive donated blood through an IV tube (transfusion).  Treating an infection or heart problems, if this applies.  Monitoring. You may need to be monitored while medicines that you are taking wear off. Follow these instructions at home: Eating and drinking   Drink enough fluids to keep your pee (urine) pale yellow.  Eat a healthy diet. Follow instructions from your doctor about what you can eat or drink. A healthy diet includes: ? Fresh fruits and vegetables. ? Whole grains. ? Low-fat (lean) meats. ? Low-fat dairy products.  Eat extra salt only as told. Do not add extra salt to your diet unless your doctor tells you to.  Eat small meals often.  Avoid standing up quickly after you eat. Medicines  Take over-the-counter and prescription medicines only as told by your doctor. ? Follow instructions from your doctor about changing how much you take of your medicines, if this applies. ? Do not stop or change any of your medicines on your own. General instructions   Wear compression stockings as told by your doctor.  Get up slowly from lying down or sitting.  Avoid hot showers and a lot of heat as told by your doctor.  Return to your normal activities as told by your doctor. Ask what activities are safe for you.  Do not use any products that contain nicotine or tobacco, such as cigarettes, e-cigarettes, and chewing tobacco. If you need help quitting, ask your doctor.  Keep all follow-up visits as told by your doctor. This is important. Contact a doctor  if:  You throw up (vomit).  You have watery poop (diarrhea).  You have a fever for more than 2-3 days.  You feel more thirsty than normal.  You feel weak and tired. Get help right away if:  You have chest pain.  You have a fast or uneven heartbeat.  You lose feeling (have numbness) in any part of your body.  You cannot move your arms  or your legs.  You have trouble talking.  You get sweaty or feel light-headed.  You pass out.  You have trouble breathing.  You have trouble staying awake.  You feel mixed up (confused). Summary  Hypotension is also called low blood pressure. It is when the force of blood pumping through your arteries is too weak.  Hypotension may be harmless, or it may cause serious problems.  Treatment may include changing your diet and medicines, and wearing compression stockings.  In very bad cases, you may need to go to the hospital. This information is not intended to replace advice given to you by your health care provider. Make sure you discuss any questions you have with your health care provider. Document Revised: 07/18/2017 Document Reviewed: 07/18/2017 Elsevier Patient Education  [place].

## 2019-04-20 NOTE — Addendum Note (Signed)
Addended by: Ardeen Garland on: 04/20/2019 04:13 PM   Modules accepted: Orders

## 2019-04-20 NOTE — Telephone Encounter (Signed)
Added flush appt per sch msg. Called and left msg of slight time change for 3/16 appt

## 2019-04-20 NOTE — Progress Notes (Signed)
Physical Therapy Treatment Patient Details Name: Seth [name] MRN: [id number] DOB: 1945/02/28 Today's Date: 04/20/2019    History of Present Illness 74 year old male in observation with "dizziness", and needing a blood transfusion for anemia. PMH includes CAD, Stage 4 NSCCL. Hypotension, increased troponin level , and potential for chronic PEs.    PT Comments    Patient had been sitting in BS chair for 1 1/2 hours when PT arrived for ambulation session. He was able to ambulate with RW for 92 feet with no LOB- mild shortness of breath. On return to room, he preferred to lie back down in bed and rest. No adverse effects this session. He did report discomfort in his back- and this was reported to RN as he requested medication. Should continue to benefit from PT to further address goals for optimal functional outcomes.   Follow Up Recommendations  Outpatient PT     Equipment Recommendations       Recommendations for Other Services       Precautions / Restrictions Precautions Precautions: Fall Precaution Comments: Monitor closely- skin and joint integrity, also s/s of orthstasis. Restrictions Weight Bearing Restrictions: No Other Position/Activity Restrictions: He indicates that he has chronic sinusitis- and tends to feel lighted- but this occurs even when he is just sitting and not just from sit<>stand    Mobility  Bed Mobility Overal bed mobility: Needs Assistance Bed Mobility: Rolling           General bed mobility comments: He has difficulty performing log rolling- but should be encouraged to do this to protect his back  Transfers Overall transfer level: Needs assistance Equipment used: 1 person hand held assist Transfers: Sit to/from Stand Sit to Stand: Min guard         General transfer comment: Used RW for ambulation in hallway  Ambulation/Gait Ambulation/Gait assistance: Min guard Gait Distance (Feet): 92 Feet Assistive device: Rolling walker (2  wheeled) Gait Pattern/deviations: Shuffle;Wide base of support   Gait velocity interpretation: 1.31 - 2.62 ft/sec, indicative of limited community Metallurgist Rankin (Stroke Patients Only)       Balance Overall balance assessment: Needs assistance Sitting-balance support: No upper extremity supported Sitting balance-Leahy Scale: Good     Standing balance support: Single extremity supported Standing balance-Leahy Scale: Good                              Cognition Arousal/Alertness: Awake/alert   Overall Cognitive Status: Within Functional Limits for tasks assessed                                 General Comments: wife present visiting and presents as healthy and supportive- his primary caregiver      Exercises General Exercises - Lower Extremity Ankle Circles/Pumps: Seated;AROM;Both;10 reps Quad Sets: Seated;AROM;Both;10 reps Gluteal Sets: Seated;AROM;Both;10 reps Short Arc Quad: Seated;AROM;Both;10 reps Long Arc Quad: Both;10 reps Heel Slides: Seated;AROM;Both;10 reps Hip ABduction/ADduction: Seated;AROM;Both;10 reps Other Exercises Other Exercises: good return demo on LE and printed ex sheet issued. Also instructed in UE flexion stretching , and abduction. Has yellow and orange theraband at home. 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 He reports that a pillow at his back does not help Pain Descriptors / Indicators: Aching;Pressure Pain Intervention(s): (Reported to RN that patient was requesting pain meds)    Home Living                      Prior Function            PT Goals (current goals can  now be found in the care plan section) Acute Rehab PT Goals Patient Stated Goal: to get well and continue to get stronger with PT PT Goal Formulation: With patient Time For Goal Achievement: 05/03/19 Potential to Achieve Goals: Good Progress towards PT goals: Progressing toward goals    Frequency    Min 3X/week      PT Plan      Co-evaluation              AM-PAC PT "6 Clicks" Mobility   Outcome Measure  Help needed turning from your back to your side while in a flat bed without using bedrails?: A Little Help needed moving from lying on your back to sitting on the side of a flat bed without using bedrails?: A Little Help needed moving to and from a bed to a chair (including a wheelchair)?: A Little Help needed standing up from a chair using your arms (e.g., wheelchair or bedside chair)?: A Little Help needed to walk in hospital room?: A Little Help needed climbing 3-5 steps with a railing? : A Lot 6 Click Score: 17    End of Session   Activity Tolerance: Patient tolerated treatment well Patient left: in bed;with call bell/phone within reach   PT Visit Diagnosis: Muscle weakness (generalized) (M62.81)     Time: 1100-1116 PT Time Calculation (min) (ACUTE ONLY): 16 min  Charges:  $Gait Training: 8-22 mins $Therapeutic Exercise: 8-22 mins $Therapeutic Activity: 8-22 mins                    Jaeden Westbay P, PT # [phone] CGV cell   Casandra Doffing 04/20/2019, 11:24 AM

## 2019-04-20 NOTE — TOC Transition Note (Signed)
Transition of Care [facility]) - CM/SW Discharge Note   Patient Details  Name: Seth [name] MRN: [id number] Date of Birth: 06-01-1945  [facility] CM/SW Contact:  Dessa Phi, RN Phone Number: 04/20/2019, 10:44 AM   Clinical Narrative: d/c home w/otpt PT rehab @ [facility]. Manual script in shadow chart,along with orthopedic address-patient will call to continue w/otpt PT. No further CM needs.      Final next level of care: OP Rehab Barriers to Discharge: No Barriers Identified   Patient Goals and CMS Choice        Discharge Placement                       Discharge Plan and Services                                     Social Determinants of Health (SDOH) Interventions     Readmission Risk Interventions No flowsheet data found.

## 2019-04-20 NOTE — Progress Notes (Signed)
Progress Note  Patient Name: Seth [name] Date of Encounter: 04/20/2019  Primary Cardiologist: Dr Radford Pax  Subjective   No CP or dyspnea  Inpatient Medications    Scheduled Meds: . sodium chloride   Intravenous Once  . atorvastatin  40 mg Oral Daily  . Chlorhexidine Gluconate Cloth  6 each Topical Daily  . clopidogrel  75 mg Oral Daily  . famotidine  20 mg Oral BID  . magnesium oxide  400 mg Oral BID  . sodium chloride flush  10-40 mL Intracatheter Q12H   Continuous Infusions: . magnesium sulfate bolus IVPB 2 g (04/20/19 0847)   PRN Meds: acetaminophen **OR** acetaminophen, magic mouthwash, ondansetron **OR** ondansetron (ZOFRAN) IV, prochlorperazine, sodium chloride flush   Vital Signs    Vitals:   04/19/19 1458 04/19/19 1552 04/19/19 2035 04/20/19 0502  BP:  129/70 123/73 131/78  Pulse:  97 98 93  Resp:  20 18 16   Temp:  98.2 F (36.8 C) 98.4 F (36.9 C) 98.2 F (36.8 C)  TempSrc:  Oral Oral Oral  SpO2: 98% 97% 96% 96%  Weight:      Height:        Intake/Output Summary (Last 24 hours) at 04/20/2019 0907 Last data filed at 04/20/2019 0701 Gross per 24 hour  Intake 452.5 ml  Output 801 ml  Net -348.5 ml   Last 3 Weights 04/18/2019 04/18/2019 04/06/2019  Weight (lbs) 176 lb 3.2 oz 182 lb 3.2 oz 182 lb 3.2 oz  Weight (kg) 79.924 kg 82.645 kg 82.645 kg      Telemetry    Sinus- Personally Reviewed  Physical Exam   GEN: No acute distress.   Neck: No JVD Cardiac: RRR, no murmurs, rubs, or gallops.  Respiratory: Clear to auscultation bilaterally. GI: Soft, nontender, non-distended  MS: No edema Neuro:  Nonfocal  Psych: Normal affect   Labs    High Sensitivity Troponin:   Recent Labs  Lab 04/18/19 1531 04/18/19 1725 04/19/19 0401  TROPONINIHS 108* 109* 65*  70*      Chemistry Recent Labs  Lab 04/14/19 1227 04/18/19 1531 04/20/19 0754  NA 137 133* 135  K 3.3* 3.3* 3.6  CL 100 98 103  CO2 25 24 25   GLUCOSE 109* 120* 112*  BUN 12 14 9    CREATININE 0.99 0.94 0.84  CALCIUM 8.5* 8.7* 8.2*  PROT 7.0  --   --   ALBUMIN 3.6  --   --   AST 25  --   --   ALT 21  --   --   ALKPHOS 139*  --   --   BILITOT 0.9  --   --   GFRNONAA >60 >60 >60  GFRAA >60 >60 >60  ANIONGAP 12 11 7      Hematology Recent Labs  Lab 04/18/19 1531 04/19/19 0401 04/20/19 0754  WBC 4.5 2.9* 2.8*  RBC 2.87* 2.16* 2.68*  HGB 8.4* 6.5* 7.9*  HCT 25.8* 19.7* 24.5*  MCV 89.9 91.2 91.4  MCH 29.3 30.1 29.5  MCHC 32.6 33.0 32.2  RDW 17.6* 17.7* 18.0*  PLT 123* 107* 137*    DDimer  Recent Labs  Lab 04/18/19 1601  DDIMER 2.09*     Radiology    NM Pulmonary Perfusion  Result Date: 04/18/2019 CLINICAL DATA:  PE suspected, low to intermediate probability, negative D-dimer EXAM: NUCLEAR MEDICINE PERFUSION LUNG SCAN TECHNIQUE: Perfusion images were obtained in multiple projections after intravenous injection of radiopharmaceutical. Ventilation scans intentionally deferred if perfusion scan and  chest x-ray adequate for interpretation during COVID 19 epidemic. RADIOPHARMACEUTICALS:  1.4 millicuries mCi OP-92T MAA IV COMPARISON:  CT 04/17/2019, radiograph 03/20/2018 FINDINGS: Photopenic fissure sign in the left lung corresponds to the region of scarring and architectural distortion seen in the left lung on comparison CT and chest radiograph. Remaining portions of the lungs perfuse normally without segmental filling defect to suggest pulmonary artery embolism. IMPRESSION: 'Fissure' sign in the left mid lung corresponding to region of scarring and architectural distortion on CT and radiograph. No other perfusion defects to suggest pulmonary embolism. Electronically Signed   By: Lovena Le M.D.   On: 04/18/2019 23:59   DG Chest Portable 1 View  Result Date: 04/18/2019 CLINICAL DATA:  Hypotension. Near syncope. Dizziness. EXAM: PORTABLE CHEST 1 VIEW COMPARISON:  Chest CT yesterday. FINDINGS: Right chest port remains in place. Chronic hyperinflation with  emphysema. Volume loss in the left hemithorax with left perihilar opacities, stable from CT yesterday. Heart is normal in size. Aortic atherosclerosis. No acute airspace disease, pulmonary edema, pneumothorax or large pleural effusion. Pulmonary nodules on CT yesterday are not well demonstrated radiographically no acute osseous abnormalities are seen. Enteric contrast in the splenic flexure of the colon from yesterday's CT. IMPRESSION: 1. No acute findings. 2. Sequela of left lung cancer with volume loss in the left hemithorax and perihilar opacities, assessed on CT yesterday. 3. Hyperinflation and emphysema.  Aortic atherosclerosis. Aortic Atherosclerosis (ICD10-I70.0) and Emphysema (ICD10-J43.9). Electronically Signed   By: Keith Rake M.D.   On: 04/18/2019 16:05    Patient Profile     74 y.o. male with metastatic lung cancer, history of coronary disease, hypertension, hyperlipidemia admitted with hypotension felt secondary to dehydration and palpitations.  Cardiology seen for elevated troponin.  Assessment & Plan    1 Elevated troponin-no chest pain.  No clear trend.  No plans for further ischemia evaluation.  2 coronary artery disease-continue Plavix and statin.  3 dizziness-some orthostatic symptoms likely related to chemotherapy and decreased p.o. intake.  Would continue to hold all blood pressure medications as blood pressure is controlled.  Encourage fluid intake.  Echocardiogram is pending.  No arrhythmias noted on telemetry.  4 metastatic lung cancer-it is noted that he has bilateral adrenal metastases.  Question if adrenal insufficiency is contributing to his symptoms.  Would evaluate further per primary care.  5 anemia-transfusions as needed per primary care.  [facility] will sign off.   Medication Recommendations: Continue off of blood pressure medications. Other recommendations (labs, testing, etc): No additional cardiac testing Follow up as an outpatient: Dr. Radford Pax 2 to  4 weeks following discharge.  Monitor can be considered at that time if he has continued bouts of increased heart rate.   For questions or updates, please contact [place] Please consult [email or web address] for contact info under        Signed, Kirk Ruths, MD  04/20/2019, 9:07 AM

## 2019-04-21 ENCOUNTER — Encounter: Payer: Self-pay | Admitting: Internal Medicine

## 2019-04-21 ENCOUNTER — Inpatient Hospital Stay: Payer: Medicare Other

## 2019-04-21 ENCOUNTER — Other Ambulatory Visit: Payer: Self-pay

## 2019-04-21 ENCOUNTER — Other Ambulatory Visit: Payer: Self-pay | Admitting: Internal Medicine

## 2019-04-21 ENCOUNTER — Inpatient Hospital Stay (HOSPITAL_BASED_OUTPATIENT_CLINIC_OR_DEPARTMENT_OTHER): Payer: Medicare Other | Admitting: Internal Medicine

## 2019-04-21 VITALS — BP 126/79 | HR 91 | Temp 97.8°F | Resp 18 | Ht 73.0 in | Wt 181.9 lb

## 2019-04-21 DIAGNOSIS — C3411 Malignant neoplasm of upper lobe, right bronchus or lung: Secondary | ICD-10-CM | POA: Diagnosis not present

## 2019-04-21 DIAGNOSIS — C3492 Malignant neoplasm of unspecified part of left bronchus or lung: Secondary | ICD-10-CM

## 2019-04-21 DIAGNOSIS — I10 Essential (primary) hypertension: Secondary | ICD-10-CM | POA: Diagnosis not present

## 2019-04-21 DIAGNOSIS — Z923 Personal history of irradiation: Secondary | ICD-10-CM | POA: Diagnosis not present

## 2019-04-21 DIAGNOSIS — Z888 Allergy status to other drugs, medicaments and biological substances status: Secondary | ICD-10-CM | POA: Diagnosis not present

## 2019-04-21 DIAGNOSIS — C7972 Secondary malignant neoplasm of left adrenal gland: Secondary | ICD-10-CM | POA: Diagnosis not present

## 2019-04-21 DIAGNOSIS — Z9221 Personal history of antineoplastic chemotherapy: Secondary | ICD-10-CM | POA: Diagnosis not present

## 2019-04-21 DIAGNOSIS — Z87891 Personal history of nicotine dependence: Secondary | ICD-10-CM | POA: Diagnosis not present

## 2019-04-21 DIAGNOSIS — I739 Peripheral vascular disease, unspecified: Secondary | ICD-10-CM | POA: Diagnosis not present

## 2019-04-21 DIAGNOSIS — Z5112 Encounter for antineoplastic immunotherapy: Secondary | ICD-10-CM | POA: Diagnosis not present

## 2019-04-21 DIAGNOSIS — Z95828 Presence of other vascular implants and grafts: Secondary | ICD-10-CM

## 2019-04-21 DIAGNOSIS — M5137 Other intervertebral disc degeneration, lumbosacral region: Secondary | ICD-10-CM | POA: Diagnosis not present

## 2019-04-21 DIAGNOSIS — M5136 Other intervertebral disc degeneration, lumbar region: Secondary | ICD-10-CM | POA: Diagnosis not present

## 2019-04-21 DIAGNOSIS — J984 Other disorders of lung: Secondary | ICD-10-CM | POA: Diagnosis not present

## 2019-04-21 DIAGNOSIS — I2510 Atherosclerotic heart disease of native coronary artery without angina pectoris: Secondary | ICD-10-CM

## 2019-04-21 DIAGNOSIS — D649 Anemia, unspecified: Secondary | ICD-10-CM

## 2019-04-21 DIAGNOSIS — Z79899 Other long term (current) drug therapy: Secondary | ICD-10-CM | POA: Diagnosis not present

## 2019-04-21 DIAGNOSIS — Z87820 Personal history of traumatic brain injury: Secondary | ICD-10-CM | POA: Diagnosis not present

## 2019-04-21 DIAGNOSIS — C7951 Secondary malignant neoplasm of bone: Secondary | ICD-10-CM | POA: Diagnosis not present

## 2019-04-21 DIAGNOSIS — Z5111 Encounter for antineoplastic chemotherapy: Secondary | ICD-10-CM

## 2019-04-21 DIAGNOSIS — D6481 Anemia due to antineoplastic chemotherapy: Secondary | ICD-10-CM | POA: Diagnosis not present

## 2019-04-21 DIAGNOSIS — C7971 Secondary malignant neoplasm of right adrenal gland: Secondary | ICD-10-CM | POA: Diagnosis not present

## 2019-04-21 DIAGNOSIS — I700 Atherosclerosis of aorta: Secondary | ICD-10-CM | POA: Diagnosis not present

## 2019-04-21 DIAGNOSIS — R0609 Other forms of dyspnea: Secondary | ICD-10-CM | POA: Diagnosis not present

## 2019-04-21 LAB — CBC WITH DIFFERENTIAL (CANCER CENTER ONLY)
Abs Immature Granulocytes: 0.02 10*3/uL (ref 0.00–0.07)
Basophils Absolute: 0 10*3/uL (ref 0.0–0.1)
Basophils Relative: 1 %
Eosinophils Absolute: 0.1 10*3/uL (ref 0.0–0.5)
Eosinophils Relative: 1 %
HCT: 27.9 % — ABNORMAL LOW (ref 39.0–52.0)
Hemoglobin: 9.1 g/dL — ABNORMAL LOW (ref 13.0–17.0)
Immature Granulocytes: 1 %
Lymphocytes Relative: 11 %
Lymphs Abs: 0.4 10*3/uL — ABNORMAL LOW (ref 0.7–4.0)
MCH: 29.4 pg (ref 26.0–34.0)
MCHC: 32.6 g/dL (ref 30.0–36.0)
MCV: 90.3 fL (ref 80.0–100.0)
Monocytes Absolute: 0.5 10*3/uL (ref 0.1–1.0)
Monocytes Relative: 12 %
Neutro Abs: 3 10*3/uL (ref 1.7–7.7)
Neutrophils Relative %: 74 %
Platelet Count: 171 10*3/uL (ref 150–400)
RBC: 3.09 MIL/uL — ABNORMAL LOW (ref 4.22–5.81)
RDW: 18.2 % — ABNORMAL HIGH (ref 11.5–15.5)
WBC Count: 3.9 10*3/uL — ABNORMAL LOW (ref 4.0–10.5)
nRBC: 0 % (ref 0.0–0.2)

## 2019-04-21 LAB — CMP (CANCER CENTER ONLY)
ALT: 11 U/L (ref 0–44)
AST: 24 U/L (ref 15–41)
Albumin: 3.3 g/dL — ABNORMAL LOW (ref 3.5–5.0)
Alkaline Phosphatase: 123 U/L (ref 38–126)
Anion gap: 8 (ref 5–15)
BUN: 10 mg/dL (ref 8–23)
CO2: 24 mmol/L (ref 22–32)
Calcium: 8.7 mg/dL — ABNORMAL LOW (ref 8.9–10.3)
Chloride: 102 mmol/L (ref 98–111)
Creatinine: 0.89 mg/dL (ref 0.61–1.24)
GFR, Est AFR Am: >60 mL/min (ref >60)
GFR, Estimated: >60 mL/min (ref >60)
Glucose, Bld: 121 mg/dL — ABNORMAL HIGH (ref 70–99)
Potassium: 3.6 mmol/L (ref 3.5–5.1)
Sodium: 134 mmol/L — ABNORMAL LOW (ref 135–145)
Total Bilirubin: 0.8 mg/dL (ref 0.3–1.2)
Total Protein: 6.7 g/dL (ref 6.5–8.1)

## 2019-04-21 LAB — SAMPLE TO BLOOD BANK

## 2019-04-21 MED ORDER — DIPHENHYDRAMINE HCL 50 MG/ML IJ SOLN
25.0000 mg | Freq: Once | INTRAMUSCULAR | Status: AC
  Administered 2019-04-21: 25 mg via INTRAVENOUS

## 2019-04-21 MED ORDER — SODIUM CHLORIDE 0.9 % IV SOLN
Freq: Once | INTRAVENOUS | Status: AC
  Filled 2019-04-21: qty 250

## 2019-04-21 MED ORDER — DEXAMETHASONE SODIUM PHOSPHATE 10 MG/ML IJ SOLN
INTRAMUSCULAR | Status: AC
  Filled 2019-04-21: qty 1

## 2019-04-21 MED ORDER — PALONOSETRON HCL INJECTION 0.25 MG/5ML
INTRAVENOUS | Status: AC
  Filled 2019-04-21: qty 5

## 2019-04-21 MED ORDER — FAMOTIDINE IN NACL 20-0.9 MG/50ML-% IV SOLN
INTRAVENOUS | Status: AC
  Filled 2019-04-21: qty 50

## 2019-04-21 MED ORDER — SODIUM CHLORIDE 0.9 % IV SOLN
200.0000 mg | Freq: Once | INTRAVENOUS | Status: AC
  Administered 2019-04-21: 200 mg via INTRAVENOUS
  Filled 2019-04-21: qty 8

## 2019-04-21 MED ORDER — HEPARIN SOD (PORK) LOCK FLUSH 100 UNIT/ML IV SOLN
500.0000 [IU] | Freq: Once | INTRAVENOUS | Status: AC | PRN
  Administered 2019-04-21: 17:00:00 500 [IU]
  Filled 2019-04-21: qty 5

## 2019-04-21 MED ORDER — SODIUM CHLORIDE 0.9 % IV SOLN
500.0000 mg/m2 | Freq: Once | INTRAVENOUS | Status: AC
  Administered 2019-04-21: 16:00:00 1100 mg via INTRAVENOUS
  Filled 2019-04-21: qty 4

## 2019-04-21 MED ORDER — HEPARIN SOD (PORK) LOCK FLUSH 100 UNIT/ML IV SOLN
500.0000 [IU] | Freq: Once | INTRAVENOUS | Status: AC
  Filled 2019-04-21: qty 5

## 2019-04-21 MED ORDER — DIPHENHYDRAMINE HCL 25 MG PO TABS: 25.0000 mg | ORAL_TABLET | Freq: Once | ORAL | Status: DC

## 2019-04-21 MED ORDER — DIPHENHYDRAMINE HCL 50 MG/ML IJ SOLN
INTRAMUSCULAR | Status: AC
  Filled 2019-04-21: qty 1

## 2019-04-21 MED ORDER — SODIUM CHLORIDE 0.9 % IV SOLN
150.0000 mg | Freq: Once | INTRAVENOUS | Status: AC
  Administered 2019-04-21: 150 mg via INTRAVENOUS
  Filled 2019-04-21: qty 150

## 2019-04-21 MED ORDER — SODIUM CHLORIDE 0.9% FLUSH
10.0000 mL | INTRAVENOUS | Status: DC | PRN
  Administered 2019-04-21: 17:00:00 10 mL
  Filled 2019-04-21: qty 10

## 2019-04-21 MED ORDER — SODIUM CHLORIDE 0.9% FLUSH
10.0000 mL | INTRAVENOUS | Status: AC | PRN
  Administered 2019-04-21: 10 mL via INTRAVENOUS
  Filled 2019-04-21: qty 10

## 2019-04-21 MED ORDER — SODIUM CHLORIDE 0.9% FLUSH
3.0000 mL | INTRAVENOUS | Status: AC | PRN
  Filled 2019-04-21: qty 10

## 2019-04-21 MED ORDER — SODIUM CHLORIDE 0.9 % IV SOLN
452.5000 mg | Freq: Once | INTRAVENOUS | Status: AC
  Administered 2019-04-21: 16:00:00 450 mg via INTRAVENOUS
  Filled 2019-04-21: qty 45

## 2019-04-21 MED ORDER — PALONOSETRON HCL INJECTION 0.25 MG/5ML
0.2500 mg | Freq: Once | INTRAVENOUS | Status: AC
  Administered 2019-04-21: 14:00:00 0.25 mg via INTRAVENOUS

## 2019-04-21 MED ORDER — FAMOTIDINE IN NACL 20-0.9 MG/50ML-% IV SOLN
20.0000 mg | Freq: Once | INTRAVENOUS | Status: AC
  Administered 2019-04-21: 20 mg via INTRAVENOUS

## 2019-04-21 MED ORDER — DEXAMETHASONE SODIUM PHOSPHATE 10 MG/ML IJ SOLN
5.0000 mg | Freq: Once | INTRAMUSCULAR | Status: AC
  Administered 2019-04-21: 5 mg via INTRAVENOUS

## 2019-04-21 NOTE — Patient Instructions (Signed)
[facility] Discharge Instructions for Patients Receiving Chemotherapy  Today you received the following chemotherapy agents: pembrolizumab, pemetrexed, carboplatin.  To help prevent nausea and vomiting after your treatment, we encourage you to take your nausea medication as directed.   If you develop nausea and vomiting that is not controlled by your nausea medication, call the clinic.   BELOW ARE SYMPTOMS THAT SHOULD BE REPORTED IMMEDIATELY:  *FEVER GREATER THAN 100.5 F  *CHILLS WITH OR WITHOUT FEVER  NAUSEA AND VOMITING THAT IS NOT CONTROLLED WITH YOUR NAUSEA MEDICATION  *UNUSUAL SHORTNESS OF BREATH  *UNUSUAL BRUISING OR BLEEDING  TENDERNESS IN MOUTH AND THROAT WITH OR WITHOUT PRESENCE OF ULCERS  *URINARY PROBLEMS  *BOWEL PROBLEMS  UNUSUAL RASH Items with * indicate a potential emergency and should be followed up as soon as possible.  Feel free to call the clinic should you have any questions or concerns. The clinic phone number is [phone].  Please show the [facility] at check-in to the Emergency Department and triage nurse.

## 2019-04-21 NOTE — Progress Notes (Signed)
Nutrition Assessment   Reason for Assessment:   Inpatient RD referral   ASSESSMENT:  74 year old male with metastatic non-small cell lung cancer.  Past medical history of AAA, CAD, GERD, HLD, PVD.  Patient receiving chemotherapy.   Met with patient in infusion.  Patient reports that taste of foods and nausea effect oral intake.  Has not been taking nausea medications.  Reports that is makes him feel weird but has not discussed this with MD.  Patient can taste oil and vinegar flavors, salt and vinegar potato chips, ketcup.  Drinks a protein shake at times from [employer].    Notes from inpatient admission reviewed.   Medications: reviewed   Labs: reviewed   Anthropometrics:   Height: 73 inches Weight: 181 lb 14.4 oz 3/16 UBW: lost 30 lb since Dec 2020 per patient Noted 200 lb on 02/24/2019 BMI: 24  10% weight loss in 2 months, significant   Estimated Energy Needs  Kcals: 2460-2800 Protein: 123-140 g Fluid: > 2.4 L   NUTRITION DIAGNOSIS: Inadequate oral intake related to cancer related treatment side effects as evidenced by 10% weight loss in 2 months   INTERVENTION:  Discussed other flavorings similar to vinegar (vingerette dressings) to use as marinades, dips, dressings, sauces on foods to enhance flavor.   Discussed strategies to increase calories and protein.  Handout provided.  Encouraged patient to drink oral nutrition supplements as able for additional calories and protein Encouraged patient to discuss side effects from nausea medications with MD Patient has contact information   MONITORING, EVALUATION, GOAL: Patient will consume adequate calories and protein to maintain weight.   Next Visit: as needed  Ligaya Cormier B. Zenia Resides, [place], [place] Registered Dietitian [phone] (pager)

## 2019-04-21 NOTE — Progress Notes (Signed)
[place] [phone]   [phone]  OFFICE PROGRESS NOTE  [name], MD [place] [address]  DIAGNOSIS: Metastatic non-small cell lung cancer, adenocarcinoma initially diagnosed as stage IIB (T1c, N1, M0) non-small cell lung cancer favoring adenocarcinoma presented with right upper lobe lung nodule in addition to right hilar lymphadenopathy diagnosed in February 2020.  The patient is not a good surgical candidate for resection.  He had evidence for disease recurrence in December 2020 with adrenal gland metastasis.  PRIOR THERAPY: Concurrent chemoradiation with weekly carboplatin for AUC of 2 and paclitaxel 45 mg/M2.  First dose May 05, 2018.  Status post 7 cycles.  Last dose was given Jun 16, 2018  CURRENT THERAPY: Systemic chemotherapy with carboplatin for AUC of 5, Alimta 500 mg/M2 and Keytruda 200 mg IV every 3 weeks.  First dose starting February 17, 2019. Status post 3 cycles.  INTERVAL HISTORY: Seth [name] 74 y.o. [name] returns to the clinic today for follow-up visit accompanied by his wife.  The patient is feeling fine today with no concerning complaints except for fatigue.  He was seen in the hospital recently for dizzy spells and was found to have significant anemia.  He received PRBCs transfusion.  The patient denied having any current chest pain but has shortness of breath with exertion with no cough or hemoptysis.  He denied having any fever or chills.  He has no nausea, vomiting, diarrhea or constipation.  He denied having any headache or visual changes.  He had repeat CT scan of the chest, abdomen pelvis performed recently and he is here for evaluation and discussion of his discuss results.   MEDICAL HISTORY: Past Medical History:  Diagnosis Date  . AAA (abdominal aortic aneurysm) ([place])   . Allergic rhinitis   . Anxiety   . Back pain   . CAD (coronary artery disease)   . Cataracts, bilateral   . Emphysema,  unspecified ([place])     " moderate"  . Essential hypertension, benign   . GERD (gastroesophageal reflux disease)   . Gout   . Headache    migraines  . Hilar adenopathy   . History of hiatal hernia   . History of sciatica   . Hyperlipidemia   . lung ca dx'd 02/2018  . Lung nodule   . Pneumonia    as child  . Prostate hypertrophy   . PVD (peripheral vascular disease) ([place])   . Seasonal allergies   . Subclavian steal syndrome of left subclavian artery   . Wears dentures     ALLERGIES:  is allergic to biaxin [clarithromycin]; clarithromycin; iodinated diagnostic agents; prednisone; cortisone; flomax [tamsulosin hcl]; hepatitis b virus vaccines; and iodine.  MEDICATIONS:  Current Outpatient Medications  Medication Sig Dispense Refill  . atorvastatin (LIPITOR) 40 MG tablet Take 1 tablet (40 mg total) by mouth daily. 90 tablet 3  . cetirizine (ZYRTEC) 10 MG tablet Take 10 mg by mouth daily at 12 noon.    . clopidogrel (PLAVIX) 75 MG tablet TAKE 1 TABLET BY MOUTH EVERY DAY (Patient taking differently: Take 75 mg by mouth daily. ) 30 tablet 2  . famotidine (PEPCID) 20 MG tablet Take 20 mg by mouth 2 (two) times daily.     . folic acid (FOLVITE) 1 MG tablet Take 1 tablet (1 mg total) by mouth daily. 30 tablet 4  . hydroxypropyl methylcellulose / hypromellose (ISOPTO TEARS / GONIOVISC) 2.5 % ophthalmic solution Place 1  drop into both eyes 3 (three) times daily.    Marland Kitchen lidocaine-prilocaine (EMLA) cream Apply to Port-A-Cath site 30 minutes before infusion. (Patient taking differently: Apply 1 application topically daily as needed. Apply to [facility] for pain -A-Cath site 30 minutes before infusion.) 30 g 0  . magic mouthwash SOLN Take 5 mLs by mouth 4 (four) times daily as needed for mouth pain. 240 mL 0  . magnesium oxide (MAG-OX) 400 (241.3 Mg) MG tablet Take 1 tablet (400 mg total) by mouth 2 (two) times daily. 60 tablet 0  . metoprolol tartrate (LOPRESSOR) 25 MG tablet Take 1 tablet (25 mg total) by  mouth 2 (two) times daily. Only take if systolic blood pressure is >110 (Patient taking differently: Take 25 mg by mouth 2 (two) times daily as needed. Only take if systolic blood pressure is >110) 180 tablet 3  . polyethylene glycol (MIRALAX) 17 g packet Take 17 g by mouth daily as needed for moderate constipation. 14 each 0  . prochlorperazine (COMPAZINE) 10 MG tablet Take 1 tablet (10 mg total) by mouth every 6 (six) hours as needed for nausea or vomiting. 30 tablet 0  . sodium chloride (OCEAN) 0.65 % SOLN nasal spray Place 1 spray into both nostrils 2 (two) times daily as needed for congestion.     No current facility-administered medications for this visit.   Facility-Administered Medications Ordered in Other Visits  Medication Dose Route Frequency Provider Last Rate Last Admin  . heparin lock flush 100 unit/mL  500 Units Intracatheter Once PRN Curt Bears, MD      . heparin lock flush 100 unit/mL  500 Units Intravenous Once Curt Bears, MD      . sodium chloride flush (NS) 0.9 % injection 10 mL  10 mL Intravenous PRN Curt Bears, MD   10 mL at 04/21/19 1306  . sodium chloride flush (NS) 0.9 % injection 3 mL  3 mL Intracatheter PRN Curt Bears, MD        SURGICAL HISTORY:  Past Surgical History:  Procedure Laterality Date  . CARDIAC CATHETERIZATION     stent placement  . COLONOSCOPY W/ BIOPSIES AND POLYPECTOMY    . ILIAC ARTERY STENT    . IR IMAGING GUIDED PORT INSERTION  03/23/2019  . MULTIPLE TOOTH EXTRACTIONS    . VIDEO BRONCHOSCOPY WITH ENDOBRONCHIAL NAVIGATION N/A 02/26/2018   Procedure: VIDEO BRONCHOSCOPY WITH ENDOBRONCHIAL NAVIGATION;  Surgeon: Garner Nash, DO;  Location: [place] [facility];  Service: Thoracic;  Laterality: N/A;  . VIDEO BRONCHOSCOPY WITH ENDOBRONCHIAL NAVIGATION N/A 03/19/2018   Procedure: VIDEO BRONCHOSCOPY WITH ENDOBRONCHIAL NAVIGATION and endobronchial ultrasound;  Surgeon: Garner Nash, DO;  Location: [place];  Service: Thoracic;  Laterality:  N/A;  . VIDEO BRONCHOSCOPY WITH ENDOBRONCHIAL ULTRASOUND N/A 02/26/2018   Procedure: VIDEO BRONCHOSCOPY WITH ENDOBRONCHIAL ULTRASOUND;  Surgeon: Garner Nash, DO;  Location: [place];  Service: Thoracic;  Laterality: N/A;  . VIDEO BRONCHOSCOPY WITH ENDOBRONCHIAL ULTRASOUND N/A 03/19/2018   Procedure: NAVIGATION BRONCHOSCOPY;  Surgeon: Garner Nash, DO;  Location: [place];  Service: Thoracic;  Laterality: N/A;    REVIEW OF SYSTEMS:  Constitutional: positive for fatigue Eyes: negative Ears, nose, mouth, throat, and face: negative Respiratory: positive for dyspnea on exertion Cardiovascular: negative Gastrointestinal: negative Genitourinary:negative Integument/breast: negative Hematologic/lymphatic: negative Musculoskeletal:negative Neurological: negative Behavioral/Psych: negative Endocrine: negative Allergic/Immunologic: negative   PHYSICAL EXAMINATION: General appearance: alert, cooperative, fatigued and no distress Head: Normocephalic, without obvious abnormality, atraumatic Neck: no adenopathy, no JVD, supple, symmetrical, trachea midline and thyroid  not enlarged, symmetric, no tenderness/mass/nodules Lymph nodes: Cervical, supraclavicular, and axillary nodes normal. Resp: clear to auscultation bilaterally Back: symmetric, no curvature. ROM normal. No CVA tenderness. Cardio: regular rate and rhythm, S1, S2 normal, no murmur, click, rub or gallop GI: soft, non-tender; bowel sounds normal; no masses,  no organomegaly Extremities: extremities normal, atraumatic, no cyanosis or edema Neurologic: Alert and oriented X 3, normal strength and tone. Normal symmetric reflexes. Normal coordination and gait  ECOG PERFORMANCE STATUS: 1 - Symptomatic but completely ambulatory  Blood pressure 126/79, pulse 91, temperature 97.8 F (36.6 C), temperature source Oral, resp. rate 18, height 6' 1"  (1.854 m), weight 181 lb 14.4 oz (82.5 kg), SpO2 99 %.   LABORATORY DATA: Lab Results  Component  Value Date   WBC 2.8 (L) 04/20/2019   HGB 7.9 (L) 04/20/2019   HCT 24.5 (L) 04/20/2019   MCV 91.4 04/20/2019   PLT 137 (L) 04/20/2019      Chemistry      Component Value Date/Time   NA 135 04/20/2019 0754   NA 133 (L) 01/19/2019 1457   K 3.6 04/20/2019 0754   CL 103 04/20/2019 0754   CO2 25 04/20/2019 0754   BUN 9 04/20/2019 0754   BUN 9 01/19/2019 1457   CREATININE 0.84 04/20/2019 0754   CREATININE 0.99 04/14/2019 1227   CREATININE 1.09 03/23/2015 0928      Component Value Date/Time   CALCIUM 8.2 (L) 04/20/2019 0754   ALKPHOS 139 (H) 04/14/2019 1227   AST 25 04/14/2019 1227   ALT 21 04/14/2019 1227   BILITOT 0.9 04/14/2019 1227       RADIOGRAPHIC STUDIES: CT Abdomen Pelvis Wo Contrast  Result Date: 04/17/2019 CLINICAL DATA:  Non-small cell left upper lobe lung cancer initially treated with chemotherapy and radiation therapy completed May 2020, with recurrent metastatic disease to the bones and adrenal glands. Restaging. EXAM: CT CHEST, ABDOMEN AND PELVIS WITHOUT CONTRAST TECHNIQUE: Multidetector CT imaging of the chest, abdomen and pelvis was performed following the standard protocol without IV contrast. COMPARISON:  01/22/2019 PET-CT. 01/13/2019 CT chest, abdomen and pelvis. FINDINGS: CT CHEST FINDINGS Cardiovascular: Normal heart size. No significant pericardial effusion/thickening. Three-vessel coronary atherosclerosis. Right internal jugular Port-A-Cath terminates at the cavoatrial junction. Atherosclerotic nonaneurysmal thoracic aorta. Normal caliber pulmonary arteries. Mediastinum/Nodes: No discrete thyroid nodules. Unremarkable esophagus. No axillary adenopathy. Newly enlarged 1.1 cm AP window node (series 2/image 27). No additional pathologically enlarged mediastinal nodes. No discrete hilar adenopathy on these noncontrast images. Lungs/Pleura: No pneumothorax. No pleural effusion. Moderate centrilobular and paraseptal emphysema with mild diffuse bronchial wall thickening.  Numerous (greater than 20) small solid pulmonary nodules scattered throughout both lungs, increased in size and number. For example a 5 mm posterior right upper lobe nodule (series 6/image 61), previously 2 mm on 01/13/2019 CT. Right middle lobe 6 mm nodule (series 6/image 86), increased from 2 mm. Left lower lobe 4 mm nodule (series 6/image 98), increased from 1 mm. Sharply marginated left parahilar consolidation with mild volume loss and distortion, compatible with radiation change. Musculoskeletal: Left posterior element T9 and less discrete T5 sclerotic osseous lesions, increased in sclerosis (series 5/images 88 and 89). Mild thoracic spondylosis. CT ABDOMEN PELVIS FINDINGS Hepatobiliary: Normal liver with no liver mass. Normal gallbladder with no radiopaque cholelithiasis. No biliary ductal dilatation. Pancreas: Normal, with no mass or duct dilation. Spleen: Normal size. No mass. Adrenals/Urinary Tract: Right adrenal 3.6 cm mass, previously 3.4 cm, slightly increased. Left adrenal 3.3 cm mass, previously 2.8 cm, slightly increased. No  hydronephrosis. No renal stones. No contour deforming renal masses. Normal bladder. Stomach/Bowel: Normal non-distended stomach. Normal caliber small bowel with no small bowel wall thickening. Normal appendix. Oral contrast transits to the colon. Normal large bowel with no diverticulosis, large bowel wall thickening or pericolonic fat stranding. Vascular/Lymphatic: Atherosclerotic abdominal aorta with 4.8 cm infrarenal abdominal aortic aneurysm, stable using similar measurement technique. No pathologically enlarged lymph nodes in the abdomen or pelvis. Reproductive: Mildly enlarged prostate. Other: No pneumoperitoneum, ascites or focal fluid collection. Musculoskeletal: Predominantly lytic posterior left acetabular 3.1 cm lesion (series 2/image 120), increased from 1.7 cm. Chronic bilateral L5 pars defects with severe degenerative disc disease and 10 mm anterolisthesis at L5-S1.  IMPRESSION: 1. Numerous small solid pulmonary nodules scattered throughout both lungs, increased in size and number, compatible with progressive pulmonary metastases. 2. New mild AP window adenopathy, suspicious for nodal metastasis. 3. Bilateral adrenal metastases are slightly increased in size. 4. Predominantly lytic posterior left acetabular 3.1 cm metastasis is increased in size. Increased sclerosis/conspicuity of left posterior element T9 and T5 sclerotic osseous lesions. Findings are compatible with progressive osseous metastases. 5. Stable 4.8 cm infrarenal abdominal aortic aneurysm. Abdominal Aortic Aneurysm (ICD10-I71.9). Recommend follow-up CTA of the abdomen and pelvis in 6 months and vascular surgery referral/consultation. This recommendation follows ACR consensus guidelines: White Paper of the ACR Incidental Findings Committee II on Vascular Findings. J Am Coll Radiol 2013; 10:789-794. 6. Aortic Atherosclerosis (ICD10-I70.0). Additional chronic findings as detailed. Electronically Signed   By: Ilona Sorrel M.D.   On: 04/17/2019 15:53   CT Chest Wo Contrast  Result Date: 04/17/2019 CLINICAL DATA:  Non-small cell left upper lobe lung cancer initially treated with chemotherapy and radiation therapy completed May 2020, with recurrent metastatic disease to the bones and adrenal glands. Restaging. EXAM: CT CHEST, ABDOMEN AND PELVIS WITHOUT CONTRAST TECHNIQUE: Multidetector CT imaging of the chest, abdomen and pelvis was performed following the standard protocol without IV contrast. COMPARISON:  01/22/2019 PET-CT. 01/13/2019 CT chest, abdomen and pelvis. FINDINGS: CT CHEST FINDINGS Cardiovascular: Normal heart size. No significant pericardial effusion/thickening. Three-vessel coronary atherosclerosis. Right internal jugular Port-A-Cath terminates at the cavoatrial junction. Atherosclerotic nonaneurysmal thoracic aorta. Normal caliber pulmonary arteries. Mediastinum/Nodes: No discrete thyroid nodules.  Unremarkable esophagus. No axillary adenopathy. Newly enlarged 1.1 cm AP window node (series 2/image 27). No additional pathologically enlarged mediastinal nodes. No discrete hilar adenopathy on these noncontrast images. Lungs/Pleura: No pneumothorax. No pleural effusion. Moderate centrilobular and paraseptal emphysema with mild diffuse bronchial wall thickening. Numerous (greater than 20) small solid pulmonary nodules scattered throughout both lungs, increased in size and number. For example a 5 mm posterior right upper lobe nodule (series 6/image 61), previously 2 mm on 01/13/2019 CT. Right middle lobe 6 mm nodule (series 6/image 86), increased from 2 mm. Left lower lobe 4 mm nodule (series 6/image 98), increased from 1 mm. Sharply marginated left parahilar consolidation with mild volume loss and distortion, compatible with radiation change. Musculoskeletal: Left posterior element T9 and less discrete T5 sclerotic osseous lesions, increased in sclerosis (series 5/images 88 and 89). Mild thoracic spondylosis. CT ABDOMEN PELVIS FINDINGS Hepatobiliary: Normal liver with no liver mass. Normal gallbladder with no radiopaque cholelithiasis. No biliary ductal dilatation. Pancreas: Normal, with no mass or duct dilation. Spleen: Normal size. No mass. Adrenals/Urinary Tract: Right adrenal 3.6 cm mass, previously 3.4 cm, slightly increased. Left adrenal 3.3 cm mass, previously 2.8 cm, slightly increased. No hydronephrosis. No renal stones. No contour deforming renal masses. Normal bladder. Stomach/Bowel: Normal non-distended stomach. Normal caliber  small bowel with no small bowel wall thickening. Normal appendix. Oral contrast transits to the colon. Normal large bowel with no diverticulosis, large bowel wall thickening or pericolonic fat stranding. Vascular/Lymphatic: Atherosclerotic abdominal aorta with 4.8 cm infrarenal abdominal aortic aneurysm, stable using similar measurement technique. No pathologically enlarged lymph  nodes in the abdomen or pelvis. Reproductive: Mildly enlarged prostate. Other: No pneumoperitoneum, ascites or focal fluid collection. Musculoskeletal: Predominantly lytic posterior left acetabular 3.1 cm lesion (series 2/image 120), increased from 1.7 cm. Chronic bilateral L5 pars defects with severe degenerative disc disease and 10 mm anterolisthesis at L5-S1. IMPRESSION: 1. Numerous small solid pulmonary nodules scattered throughout both lungs, increased in size and number, compatible with progressive pulmonary metastases. 2. New mild AP window adenopathy, suspicious for nodal metastasis. 3. Bilateral adrenal metastases are slightly increased in size. 4. Predominantly lytic posterior left acetabular 3.1 cm metastasis is increased in size. Increased sclerosis/conspicuity of left posterior element T9 and T5 sclerotic osseous lesions. Findings are compatible with progressive osseous metastases. 5. Stable 4.8 cm infrarenal abdominal aortic aneurysm. Abdominal Aortic Aneurysm (ICD10-I71.9). Recommend follow-up CTA of the abdomen and pelvis in 6 months and vascular surgery referral/consultation. This recommendation follows ACR consensus guidelines: White Paper of the ACR Incidental Findings Committee II on Vascular Findings. J Am Coll Radiol 2013; 10:789-794. 6. Aortic Atherosclerosis (ICD10-I70.0). Additional chronic findings as detailed. Electronically Signed   By: Ilona Sorrel M.D.   On: 04/17/2019 15:53   NM Pulmonary Perfusion  Result Date: 04/18/2019 CLINICAL DATA:  PE suspected, low to intermediate probability, negative D-dimer EXAM: NUCLEAR MEDICINE PERFUSION LUNG SCAN TECHNIQUE: Perfusion images were obtained in multiple projections after intravenous injection of radiopharmaceutical. Ventilation scans intentionally deferred if perfusion scan and chest x-ray adequate for interpretation during COVID 19 epidemic. RADIOPHARMACEUTICALS:  1.4 millicuries mCi [id number] MAA IV COMPARISON:  CT 04/17/2019, radiograph  03/20/2018 FINDINGS: Photopenic fissure sign in the left lung corresponds to the region of scarring and architectural distortion seen in the left lung on comparison CT and chest radiograph. Remaining portions of the lungs perfuse normally without segmental filling defect to suggest pulmonary artery embolism. IMPRESSION: 'Fissure' sign in the left mid lung corresponding to region of scarring and architectural distortion on CT and radiograph. No other perfusion defects to suggest pulmonary embolism. Electronically Signed   By: Lovena Le M.D.   On: 04/18/2019 23:59   DG Chest Portable 1 View  Result Date: 04/18/2019 CLINICAL DATA:  Hypotension. Near syncope. Dizziness. EXAM: PORTABLE CHEST 1 VIEW COMPARISON:  Chest CT yesterday. FINDINGS: Right chest port remains in place. Chronic hyperinflation with emphysema. Volume loss in the left hemithorax with left perihilar opacities, stable from CT yesterday. Heart is normal in size. Aortic atherosclerosis. No acute airspace disease, pulmonary edema, pneumothorax or large pleural effusion. Pulmonary nodules on CT yesterday are not well demonstrated radiographically no acute osseous abnormalities are seen. Enteric contrast in the splenic flexure of the colon from yesterday's CT. IMPRESSION: 1. No acute findings. 2. Sequela of left lung cancer with volume loss in the left hemithorax and perihilar opacities, assessed on CT yesterday. 3. Hyperinflation and emphysema.  Aortic atherosclerosis. Aortic Atherosclerosis (ICD10-I70.0) and Emphysema (ICD10-J43.9). Electronically Signed   By: Keith Rake M.D.   On: 04/18/2019 16:05   VAS [place] ABI WITH/WO TBI  Result Date: 03/25/2019 LOWER EXTREMITY DOPPLER STUDY Indications: Peripheral artery disease. High Risk Factors: Hypertension, past history of smoking.  Vascular Interventions: Bilateral iliac stents within the last 2 months at an  outside facility according to the patient. Performing Technologist:  Delorise Shiner RVT  Examination Guidelines: A complete evaluation includes at minimum, Doppler waveform signals and systolic blood pressure reading at the level of bilateral brachial, anterior tibial, and posterior tibial arteries, when vessel segments are accessible. Bilateral testing is considered an integral part of a complete examination. Photoelectric Plethysmograph (PPG) waveforms and toe systolic pressure readings are included as required and additional duplex testing as needed. Limited examinations for reoccurring indications may be performed as noted.  ABI Findings: +---------+------------------+-----+----------+--------+ Right    Rt Pressure (mmHg)IndexWaveform  Comment  +---------+------------------+-----+----------+--------+ Brachial 122                                       +---------+------------------+-----+----------+--------+ ATA      77                0.63                    +---------+------------------+-----+----------+--------+ PTA      76                0.62 monophasic         +---------+------------------+-----+----------+--------+ DP                              monophasic         +---------+------------------+-----+----------+--------+ Great Toe19                0.16                    +---------+------------------+-----+----------+--------+ +---------+------------------+-----+----------+-------+ Left     Lt Pressure (mmHg)IndexWaveform  Comment +---------+------------------+-----+----------+-------+ Brachial 86                                       +---------+------------------+-----+----------+-------+ ATA      78                0.64                   +---------+------------------+-----+----------+-------+ PTA      69                0.57 monophasic        +---------+------------------+-----+----------+-------+ DP                              monophasic        +---------+------------------+-----+----------+-------+ Great  Toe16                0.13                   +---------+------------------+-----+----------+-------+ +-------+-----------+-----------+------------+------------+ ABI/TBIToday's ABIToday's TBIPrevious ABIPrevious TBI +-------+-----------+-----------+------------+------------+ Right  0.63       0.16                                +-------+-----------+-----------+------------+------------+ Left   0.64       0.13                                +-------+-----------+-----------+------------+------------+  Summary: Right: Resting right ankle-brachial index indicates  moderate right lower extremity arterial disease. The right toe-brachial index is abnormal. RT great toe pressure = 19 mmHg. Left: Resting left ankle-brachial index indicates moderate left lower extremity arterial disease. The left toe-brachial index is abnormal. LT Great toe pressure = 16 mmHg.  *See table(s) above for measurements and observations.  Electronically signed by Deitra Mayo MD on 03/25/2019 at 4:48:03 PM.    Final    IR IMAGING GUIDED PORT INSERTION  Result Date: 03/23/2019 INDICATION: History of metastatic lung cancer. In need of durable intravenous access for chemotherapy administration EXAM: IMPLANTED PORT A CATH PLACEMENT WITH ULTRASOUND AND FLUOROSCOPIC GUIDANCE COMPARISON:  PET-CT-01/22/2019 MEDICATIONS: Ancef 2 gm IV; The antibiotic was administered within an appropriate time interval prior to skin puncture. ANESTHESIA/SEDATION: Moderate (conscious) sedation was employed during this procedure. A total of Versed 3 mg and Fentanyl 100 mcg was administered intravenously. Moderate Sedation Time: 25 minutes. The patient's level of consciousness and vital signs were monitored continuously by radiology nursing throughout the procedure under my direct supervision. CONTRAST:  None FLUOROSCOPY TIME:  24 seconds (6 mGy) COMPLICATIONS: None immediate. PROCEDURE: The procedure, risks, benefits, and alternatives were  explained to the patient. Questions regarding the procedure were encouraged and answered. The patient understands and consents to the procedure. The right neck and chest were prepped with chlorhexidine in a sterile fashion, and a sterile drape was applied covering the operative field. Maximum barrier sterile technique with sterile gowns and gloves were used for the procedure. A timeout was performed prior to the initiation of the procedure. Local anesthesia was provided with 1% lidocaine with epinephrine. After creating a small venotomy incision, a micropuncture kit was utilized to access the internal jugular vein. Real-time ultrasound guidance was utilized for vascular access including the acquisition of a permanent ultrasound image documenting patency of the accessed vessel. The microwire was utilized to measure appropriate catheter length. A subcutaneous port pocket was then created along the upper chest wall utilizing a combination of sharp and blunt dissection. The pocket was irrigated with sterile saline. A single lumen "Slim" sized power injectable port was chosen for placement. The 8 Fr catheter was tunneled from the port pocket site to the venotomy incision. The port was placed in the pocket. The external catheter was trimmed to appropriate length. At the venotomy, an 8 Fr peel-away sheath was placed over a guidewire under fluoroscopic guidance. The catheter was then placed through the sheath and the sheath was removed. Final catheter positioning was confirmed and documented with a fluoroscopic spot radiograph. The port was accessed with a Huber needle, aspirated and flushed with heparinized saline. The venotomy site was closed with an interrupted 4-0 Vicryl suture. The port pocket incision was closed with interrupted 2-0 Vicryl suture and the skin was opposed with a running subcuticular 4-0 Vicryl suture. Dermabond and Steri-strips were applied to both incisions. Dressings were placed. The patient  tolerated the procedure well without immediate post procedural complication. FINDINGS: After catheter placement, the tip lies within the superior cavoatrial junction. The catheter aspirates and flushes normally and is ready for immediate use. IMPRESSION: Successful placement of a right internal jugular approach power injectable Port-A-Cath. The catheter is ready for immediate use. Electronically Signed   By: Sandi Mariscal M.D.   On: 03/23/2019 16:15    ASSESSMENT AND PLAN: This is a very pleasant 74 years old white [name] diagnosed with metastatic non-small cell lung cancer that was initially diagnosed as unresectable stage IIb non-small cell lung cancer, adenocarcinoma presented with right upper  lobe lung nodule in addition to right hilar lymphadenopathy diagnosed in February 2020.  The patient is now a good candidate for surgical resection. The patient underwent a course of concurrent chemoradiation with weekly carboplatin and paclitaxel status post 7 cycles completed in May 2020. The patient is currently on observation and he is feeling fine. He had repeat CT scan of the chest, abdomen pelvis performed recently.  I personally and independently reviewed the scan images and discussed the results with the patient today. Unfortunately his a scan showed further progression of the bilateral adrenal lesions suspicious for metastasis. This finding were confirmed by a PET scan. The patient underwent CT-guided core biopsy of the left adrenal gland lesion and the final pathology was consistent with metastatic adenocarcinoma of the lung. The patient is currently undergoing systemic chemotherapy with carboplatin for AUC of 5, Alimta 500 mg/M2 and Keytruda 200 mg IV every 3 weeks.  He is status post 3 cycles.  The patient has been tolerating his treatment well except for fatigue secondary to chemotherapy-induced anemia. He had repeat CT scan of the chest, abdomen pelvis performed recently.  I personally and  independently reviewed the scan images and discussed the results with the patient and his wife today. His scan showed some evidence for disease progression with increase in the size of some of the pulmonary nodule as well as the adrenal gland. I discussed with the patient several options for management of his condition including continuation of the current systemic chemotherapy with carboplatin, Alimta and Keytruda for cycle #4 followed by maintenance Alimta and Keytruda starting from cycle #5.  I also gave him the option of switching to second line treatment with docetaxel and Cyramza but the adverse effect are significant for this patient with his current general condition and comorbidities versus palliative care and hospice referral.  The patient decided to continue with the current treatment for now and will continue to monitor his disease closely on the upcoming imaging studies. He will proceed with cycle #4 today. For the chemotherapy-induced anemia, he received PRBCs transfusion in the hospital recently.  We will continue to monitor his hemoglobin and hematocrit closely and consider the patient for transfusion if needed. The patient was advised to call immediately if he has any concerning symptoms in the interval. The patient voices understanding of current disease status and treatment options and is in agreement with the current care plan. All questions were answered. The patient knows to call the clinic with any problems, questions or concerns. We can certainly see the patient much sooner if necessary.  Disclaimer: This note was dictated with voice recognition software. Similar sounding words can inadvertently be transcribed and may not be corrected upon review.

## 2019-04-22 ENCOUNTER — Telehealth: Payer: Self-pay | Admitting: Cardiology

## 2019-04-22 DIAGNOSIS — R262 Difficulty in walking, not elsewhere classified: Secondary | ICD-10-CM | POA: Diagnosis not present

## 2019-04-22 DIAGNOSIS — M25672 Stiffness of left ankle, not elsewhere classified: Secondary | ICD-10-CM | POA: Diagnosis not present

## 2019-04-22 DIAGNOSIS — M6281 Muscle weakness (generalized): Secondary | ICD-10-CM | POA: Diagnosis not present

## 2019-04-22 DIAGNOSIS — S82832D Other fracture of upper and lower end of left fibula, subsequent encounter for closed fracture with routine healing: Secondary | ICD-10-CM | POA: Diagnosis not present

## 2019-04-22 NOTE — Telephone Encounter (Signed)
Spoke with patient who states he is unsure who told him that he didn't need to have an echocardiogram or why they told him that. He states that he will be coming in to have it done.

## 2019-04-22 NOTE — Telephone Encounter (Signed)
Patient was admitted to [facility] over the weekend. He was told while he was in the hospital that his upcoming Echo may not be necessary. He wanted to confirm this with Dr. Radford Pax before he cancelled his echo. Please let the patient know if he beeds ti have the test tomorrow

## 2019-04-23 ENCOUNTER — Other Ambulatory Visit: Payer: Self-pay | Admitting: *Deleted

## 2019-04-23 ENCOUNTER — Encounter: Payer: Self-pay | Admitting: *Deleted

## 2019-04-23 ENCOUNTER — Telehealth: Payer: Self-pay | Admitting: Internal Medicine

## 2019-04-23 ENCOUNTER — Other Ambulatory Visit: Payer: Self-pay

## 2019-04-23 ENCOUNTER — Ambulatory Visit (HOSPITAL_COMMUNITY): Payer: Medicare Other | Attending: Cardiovascular Disease

## 2019-04-23 DIAGNOSIS — Z5181 Encounter for therapeutic drug level monitoring: Secondary | ICD-10-CM | POA: Insufficient documentation

## 2019-04-23 DIAGNOSIS — I358 Other nonrheumatic aortic valve disorders: Secondary | ICD-10-CM | POA: Insufficient documentation

## 2019-04-23 DIAGNOSIS — Z87891 Personal history of nicotine dependence: Secondary | ICD-10-CM | POA: Diagnosis not present

## 2019-04-23 DIAGNOSIS — E785 Hyperlipidemia, unspecified: Secondary | ICD-10-CM | POA: Insufficient documentation

## 2019-04-23 DIAGNOSIS — Z09 Encounter for follow-up examination after completed treatment for conditions other than malignant neoplasm: Secondary | ICD-10-CM

## 2019-04-23 DIAGNOSIS — C3490 Malignant neoplasm of unspecified part of unspecified bronchus or lung: Secondary | ICD-10-CM | POA: Diagnosis not present

## 2019-04-23 DIAGNOSIS — Z79899 Other long term (current) drug therapy: Secondary | ICD-10-CM | POA: Insufficient documentation

## 2019-04-23 DIAGNOSIS — I2510 Atherosclerotic heart disease of native coronary artery without angina pectoris: Secondary | ICD-10-CM | POA: Insufficient documentation

## 2019-04-23 DIAGNOSIS — I119 Hypertensive heart disease without heart failure: Secondary | ICD-10-CM | POA: Insufficient documentation

## 2019-04-23 NOTE — Patient Outreach (Signed)
[place] [facility]) Care Management [facility] CM Telephone Outreach, [place] Red Alert Notification- General Discharge PCP office completes Transition of Care follow up post-hospital discharge Post-hospital discharge day # 3  04/23/2019  Seth [name] December 29, 1945 [id number]  EMMI Red-Alert notification/ General Discharge EMMI call date/ day #: Wednesday April 22, 2019; day # 1 Red-Alert reason(s): "no scheduled follow up"  Successful telephone outreach to [facility], 74 y/o male referred to [facility] RN CM by [facility] CMA this morning after EMMI Red-Alert notification received, as above; patient was recently hospitalized for observation March 13-15, 2021 for dizziness and lighted headedness in setting of ongoing chemotherapy.  Patient was discharged home to self-care without hhome health services in place.  Patient has history including, but not limited to, HTN/ HLD; COPD with tobacco use; CAD/ PVD; and lung cancer with metastasis; currently receiving chemotherapy and followed by cancer center team at [facility].  HIPAA/ identity verified and purpose of call/ [facility] CM services were discussed with patient; patient agrees to complete EMI Red screening call, but declines [facility] CM services; states that he is essentially independent in care and that his wife is a CSW; no concerns or issues were identified as a result of screening call today.  Patient reports "doing fairly well" after recent hospitalization and discharge; admits to chronic pain in ankle due to fall last autumn when he slipped on water on the floor from dogs paws.  Denies falls since then and states that he attends outpatient PT regularly to get "full use of ankle" back- states "going great," and reports he is hopeful to begin driving his mustang soon.  We reviewed patient's hospital discharge instructions and patient independently verbalizes accurate understanding of same.  Patient sounds to be in no distress throughout phone call  today.  Patient further reports:  Medications: -- Has all medicationsand takes as prescribed;denies questions/ concerns around current medications -- self-manage medications using weekly pill planner box. -- denies issues with swallowing medications -- verbalizes good understanding of post-hospital discharge instructions around medications; declines full review of medications  Provider appointments: -- All recent and upcoming provider appointments were reviewed with patient today- has visited with oncologist as instructed post-hospital discharge and received scheduled chemotherapy on 04/21/19  Safety/ Mobility/ Falls: -- denies new/ recent falls since last fall in autumn as noted above  -- assistive devices: continues using walker; hopes to transition to can with outpatient rehabilitation soon -- general fall risks/ prevention education discussed with patient today  Holiday representative needs: -- currently denies community resource needs, stating supportive family members that assist with care needs as indicated -- described himself as essentially independent; lives with wife and adult son- both supportive -- family provides transportation for patient to all provider appointments, errands, etc -- SDOH completed for depression/ transportation/ food insecurity: no concerns identified  Scientist, physiological (AD) Planning:   -- does not currently have exisisting Advanced Directives for HCPOA and Living Will in place; discussed basics of creating these documents and will place educational material and planning packet in mail to patient    Patient denies further issues, concerns, or problems today.  I provided/ confirmed that patient has my direct phone number, the main [facility] CM office phone number, and the [facility] CM 24-hour nurse advice phone number should issues arise in the future and he wish to participate in [place] program.  Made patient that he may receive additional automated EMMI follow  up calls and that he would be contacted for  any identified concerns.  Plan:  Will place [facility] CM successful outreach letter in mail to patient as well as Neurosurgeon around Advanced Directive planning  Will make patient's PCP aware that he has declined [facility] CM services at this time  Oneta Rack, RN, BSN, [employer] Coordinator [facility] Care Management  [phone]

## 2019-04-23 NOTE — Telephone Encounter (Signed)
Scheduled per los. Called and left msg. Mailed April calendar

## 2019-04-24 ENCOUNTER — Other Ambulatory Visit: Payer: Self-pay | Admitting: Cardiology

## 2019-04-24 DIAGNOSIS — M25672 Stiffness of left ankle, not elsewhere classified: Secondary | ICD-10-CM | POA: Diagnosis not present

## 2019-04-24 DIAGNOSIS — M6281 Muscle weakness (generalized): Secondary | ICD-10-CM | POA: Diagnosis not present

## 2019-04-24 DIAGNOSIS — R262 Difficulty in walking, not elsewhere classified: Secondary | ICD-10-CM | POA: Diagnosis not present

## 2019-04-24 DIAGNOSIS — S82832D Other fracture of upper and lower end of left fibula, subsequent encounter for closed fracture with routine healing: Secondary | ICD-10-CM | POA: Diagnosis not present

## 2019-04-28 ENCOUNTER — Other Ambulatory Visit: Payer: Self-pay

## 2019-04-28 ENCOUNTER — Inpatient Hospital Stay: Payer: Medicare Other

## 2019-04-28 DIAGNOSIS — C3492 Malignant neoplasm of unspecified part of left bronchus or lung: Secondary | ICD-10-CM

## 2019-04-28 DIAGNOSIS — C7971 Secondary malignant neoplasm of right adrenal gland: Secondary | ICD-10-CM | POA: Diagnosis not present

## 2019-04-28 DIAGNOSIS — Z5111 Encounter for antineoplastic chemotherapy: Secondary | ICD-10-CM | POA: Diagnosis not present

## 2019-04-28 DIAGNOSIS — R5382 Chronic fatigue, unspecified: Secondary | ICD-10-CM

## 2019-04-28 DIAGNOSIS — C7951 Secondary malignant neoplasm of bone: Secondary | ICD-10-CM | POA: Diagnosis not present

## 2019-04-28 DIAGNOSIS — C7972 Secondary malignant neoplasm of left adrenal gland: Secondary | ICD-10-CM | POA: Diagnosis not present

## 2019-04-28 DIAGNOSIS — Z5112 Encounter for antineoplastic immunotherapy: Secondary | ICD-10-CM | POA: Diagnosis not present

## 2019-04-28 DIAGNOSIS — C3411 Malignant neoplasm of upper lobe, right bronchus or lung: Secondary | ICD-10-CM | POA: Diagnosis not present

## 2019-04-28 LAB — CBC WITH DIFFERENTIAL (CANCER CENTER ONLY)
Abs Immature Granulocytes: 0 10*3/uL (ref 0.00–0.07)
Basophils Absolute: 0 10*3/uL (ref 0.0–0.1)
Basophils Relative: 1 %
Eosinophils Absolute: 0 10*3/uL (ref 0.0–0.5)
Eosinophils Relative: 0 %
HCT: 27.1 % — ABNORMAL LOW (ref 39.0–52.0)
Hemoglobin: 8.8 g/dL — ABNORMAL LOW (ref 13.0–17.0)
Lymphocytes Relative: 24 %
Lymphs Abs: 0.4 10*3/uL — ABNORMAL LOW (ref 0.7–4.0)
MCH: 28.7 pg (ref 26.0–34.0)
MCHC: 32.5 g/dL (ref 30.0–36.0)
MCV: 88.3 fL (ref 80.0–100.0)
Monocytes Absolute: 0 10*3/uL — ABNORMAL LOW (ref 0.1–1.0)
Monocytes Relative: 2 %
Neutro Abs: 1.2 10*3/uL — ABNORMAL LOW (ref 1.7–7.7)
Neutrophils Relative %: 73 %
Platelet Count: 175 10*3/uL (ref 150–400)
RBC: 3.07 MIL/uL — ABNORMAL LOW (ref 4.22–5.81)
RDW: 17.6 % — ABNORMAL HIGH (ref 11.5–15.5)
WBC Count: 1.7 10*3/uL — ABNORMAL LOW (ref 4.0–10.5)
nRBC: 0 % (ref 0.0–0.2)

## 2019-04-28 LAB — CMP (CANCER CENTER ONLY)
ALT: 28 U/L (ref 0–44)
AST: 21 U/L (ref 15–41)
Albumin: 3.5 g/dL (ref 3.5–5.0)
Alkaline Phosphatase: 148 U/L — ABNORMAL HIGH (ref 38–126)
Anion gap: 13 (ref 5–15)
BUN: 14 mg/dL (ref 8–23)
CO2: 24 mmol/L (ref 22–32)
Calcium: 9.4 mg/dL (ref 8.9–10.3)
Chloride: 100 mmol/L (ref 98–111)
Creatinine: 0.91 mg/dL (ref 0.61–1.24)
GFR, Est AFR Am: >60 mL/min
GFR, Estimated: >60 mL/min
Glucose, Bld: 112 mg/dL — ABNORMAL HIGH (ref 70–99)
Potassium: 3.9 mmol/L (ref 3.5–5.1)
Sodium: 137 mmol/L (ref 135–145)
Total Bilirubin: 0.8 mg/dL (ref 0.3–1.2)
Total Protein: 7.2 g/dL (ref 6.5–8.1)

## 2019-04-28 LAB — TSH: TSH: 1.456 u[IU]/mL (ref 0.320–4.118)

## 2019-04-29 DIAGNOSIS — R262 Difficulty in walking, not elsewhere classified: Secondary | ICD-10-CM | POA: Diagnosis not present

## 2019-04-29 DIAGNOSIS — M6281 Muscle weakness (generalized): Secondary | ICD-10-CM | POA: Diagnosis not present

## 2019-04-29 DIAGNOSIS — S82832D Other fracture of upper and lower end of left fibula, subsequent encounter for closed fracture with routine healing: Secondary | ICD-10-CM | POA: Diagnosis not present

## 2019-04-29 DIAGNOSIS — M25672 Stiffness of left ankle, not elsewhere classified: Secondary | ICD-10-CM | POA: Diagnosis not present

## 2019-05-01 DIAGNOSIS — M6281 Muscle weakness (generalized): Secondary | ICD-10-CM | POA: Diagnosis not present

## 2019-05-01 DIAGNOSIS — R262 Difficulty in walking, not elsewhere classified: Secondary | ICD-10-CM | POA: Diagnosis not present

## 2019-05-01 DIAGNOSIS — M25672 Stiffness of left ankle, not elsewhere classified: Secondary | ICD-10-CM | POA: Diagnosis not present

## 2019-05-01 DIAGNOSIS — S82832D Other fracture of upper and lower end of left fibula, subsequent encounter for closed fracture with routine healing: Secondary | ICD-10-CM | POA: Diagnosis not present

## 2019-05-02 ENCOUNTER — Other Ambulatory Visit: Payer: Self-pay | Admitting: Internal Medicine

## 2019-05-04 ENCOUNTER — Other Ambulatory Visit: Payer: Self-pay

## 2019-05-04 ENCOUNTER — Inpatient Hospital Stay: Payer: Medicare Other

## 2019-05-04 DIAGNOSIS — M25672 Stiffness of left ankle, not elsewhere classified: Secondary | ICD-10-CM | POA: Diagnosis not present

## 2019-05-04 DIAGNOSIS — R262 Difficulty in walking, not elsewhere classified: Secondary | ICD-10-CM | POA: Diagnosis not present

## 2019-05-04 DIAGNOSIS — C3411 Malignant neoplasm of upper lobe, right bronchus or lung: Secondary | ICD-10-CM | POA: Diagnosis not present

## 2019-05-04 DIAGNOSIS — C7972 Secondary malignant neoplasm of left adrenal gland: Secondary | ICD-10-CM | POA: Diagnosis not present

## 2019-05-04 DIAGNOSIS — S82832D Other fracture of upper and lower end of left fibula, subsequent encounter for closed fracture with routine healing: Secondary | ICD-10-CM | POA: Diagnosis not present

## 2019-05-04 DIAGNOSIS — C7971 Secondary malignant neoplasm of right adrenal gland: Secondary | ICD-10-CM | POA: Diagnosis not present

## 2019-05-04 DIAGNOSIS — M6281 Muscle weakness (generalized): Secondary | ICD-10-CM | POA: Diagnosis not present

## 2019-05-04 DIAGNOSIS — Z5112 Encounter for antineoplastic immunotherapy: Secondary | ICD-10-CM | POA: Diagnosis not present

## 2019-05-04 DIAGNOSIS — C3492 Malignant neoplasm of unspecified part of left bronchus or lung: Secondary | ICD-10-CM

## 2019-05-04 DIAGNOSIS — R5382 Chronic fatigue, unspecified: Secondary | ICD-10-CM

## 2019-05-04 DIAGNOSIS — C7951 Secondary malignant neoplasm of bone: Secondary | ICD-10-CM | POA: Diagnosis not present

## 2019-05-04 DIAGNOSIS — Z5111 Encounter for antineoplastic chemotherapy: Secondary | ICD-10-CM | POA: Diagnosis not present

## 2019-05-04 LAB — TSH: TSH: 2.134 u[IU]/mL (ref 0.320–4.118)

## 2019-05-05 ENCOUNTER — Ambulatory Visit: Payer: Medicare Other | Attending: Internal Medicine

## 2019-05-05 DIAGNOSIS — Z23 Encounter for immunization: Secondary | ICD-10-CM

## 2019-05-05 NOTE — Progress Notes (Signed)
   Covid-19 Vaccination Clinic  Name:  TAL KEMPKER    MRN: [id number] DOB: January 14, 1946  05/05/2019  Mr. Qin was observed post Covid-19 immunization for 15 minutes without incident. He was provided with Vaccine Information Sheet and instruction to access the V-Safe system.   Mr. Majano was instructed to call 911 with any severe reactions post vaccine: Marland Kitchen Difficulty breathing  . Swelling of face and throat  . A fast heartbeat  . A bad rash all over body  . Dizziness and weakness   Immunizations Administered    Name Date Dose VIS Date Route   Pfizer COVID-19 Vaccine 05/05/2019 10:17 AM 0.3 mL 01/16/2019 Intramuscular   Manufacturer: [place]   Lot: [id number]   [place]: [id number]

## 2019-05-05 NOTE — Progress Notes (Signed)
Pharmacist Chemotherapy Monitoring - Follow Up Assessment    I verify that I have reviewed each item in the below checklist:  . Regimen for the patient is scheduled for the appropriate day and plan matches scheduled date. Marland Kitchen Appropriate non-routine labs are ordered dependent on drug ordered. . If applicable, additional medications reviewed and ordered per protocol based on lifetime cumulative doses and/or treatment regimen.   Plan for follow-up and/or issues identified: No . I-vent associated with next due treatment: No . MD and/or nursing notified: No  Oziah,Pierina Schuknecht D 05/05/2019 2:22 PM

## 2019-05-08 ENCOUNTER — Other Ambulatory Visit: Payer: Self-pay | Admitting: *Deleted

## 2019-05-08 DIAGNOSIS — C3492 Malignant neoplasm of unspecified part of left bronchus or lung: Secondary | ICD-10-CM

## 2019-05-10 NOTE — Progress Notes (Signed)
Cardiology Office Note    Date:  05/13/2019   ID:  Seth [name], DOB 02-03-1946, MRN [id number]  PCP:  Seth [name]  Cardiologist:  Dr. Radford [name]  Chief Complaint: Hospital follow up   History of Present Illness:   Seth [name] is a 74 y.o. male with history of nonobstructive CAD, metastatic lung cancer, peripheral arterial disease, hypertension, hyperlipidemia, anemia and GERD seen for hospital follow-up.  Hx of nonobstructive CADby cath in 2014 after stress test showed transient ischemic dilation and has beenon medical therapy. Had low risk stress test 04/2019. Hx of PVD with abdominal aortic aneurysm and bilateral iliac stents followed by Dr. Maryjean [name] in [facility].  Mr. Seth [name] also has known metastatic lung cancer, for which he receives chemotherapy. As a result of his chemotherapy, he has been noted to have low BP with associated lightheadedness.   Admitted 04/2019 with hypotension felt secondary to dehydration and palpitations.  Cardiology seen for elevated troponin which was flat. No chest pain. No plan for ischemic evaluation. No arrhthymias on tele. Outpatient echo showed normal LVEF.   He was seen by Dr. Primitivo [name] 2 days ago and got fifth cycle of treatment with maintenance Alimta and Keytruda.  He has history of chemotherapy-induced anemia.  Hemoglobin 7.9.  Plan to arrange 2 units of packed red blood cell this week.  Here today for follow-up.  Reports systolic blood pressure in 115-130s.  Heart rate in 90-100s.  He takes Lopressor 25 mg twice daily.  He will hold his medication when systolic blood pressure less than 110.  He denies chest pain, shortness of breath, palpitation, orthopnea, PND, syncope or melena.  Reports drinking plenty of water.  Past Medical History:  Diagnosis Date  . AAA (abdominal aortic aneurysm) ([place])   . Allergic rhinitis   . Anxiety   . Back pain   . CAD (coronary artery disease)   . Cataracts, bilateral   . Emphysema, unspecified ([place])     "  moderate"  . Essential hypertension, benign   . GERD (gastroesophageal reflux disease)   . Gout   . Headache    migraines  . Hilar adenopathy   . History of hiatal hernia   . History of sciatica   . Hyperlipidemia   . lung ca dx'd 02/2018  . Lung nodule   . Pneumonia    as child  . Prostate hypertrophy   . PVD (peripheral vascular disease) ([place])   . Seasonal allergies   . Subclavian steal syndrome of left subclavian artery   . Wears dentures     Past Surgical History:  Procedure Laterality Date  . CARDIAC CATHETERIZATION     stent placement  . COLONOSCOPY W/ BIOPSIES AND POLYPECTOMY    . ILIAC ARTERY STENT    . IR IMAGING GUIDED PORT INSERTION  03/23/2019  . MULTIPLE TOOTH EXTRACTIONS    . VIDEO BRONCHOSCOPY WITH ENDOBRONCHIAL NAVIGATION N/A 02/26/2018   Procedure: VIDEO BRONCHOSCOPY WITH ENDOBRONCHIAL NAVIGATION;  Surgeon: Seth [name];  Location: [place];  Service: Thoracic;  Laterality: N/A;  . VIDEO BRONCHOSCOPY WITH ENDOBRONCHIAL NAVIGATION N/A 03/19/2018   Procedure: VIDEO BRONCHOSCOPY WITH ENDOBRONCHIAL NAVIGATION and endobronchial ultrasound;  Surgeon: Seth [name];  Location: [place];  Service: Thoracic;  Laterality: N/A;  . VIDEO BRONCHOSCOPY WITH ENDOBRONCHIAL ULTRASOUND N/A 02/26/2018   Procedure: VIDEO BRONCHOSCOPY WITH ENDOBRONCHIAL ULTRASOUND;  Surgeon: Seth [name];  Location: [place];  Service: Thoracic;  Laterality: N/A;  . VIDEO BRONCHOSCOPY  WITH ENDOBRONCHIAL ULTRASOUND N/A 03/19/2018   Procedure: NAVIGATION BRONCHOSCOPY;  Surgeon: Seth [name];  Location: [facility];  Service: Thoracic;  Laterality: N/A;    Current Medications: Prior to Admission medications   Medication Sig Start Date End Date Taking? Authorizing Provider  atorvastatin (LIPITOR) 40 MG tablet TAKE 1 TABLET BY MOUTH EVERY DAY 04/24/19   Seth [name]  cetirizine (ZYRTEC) 10 MG tablet Take 10 mg by mouth daily at 12 noon.    Provider, Historical, [name]  clopidogrel  (PLAVIX) 75 MG tablet TAKE 1 TABLET BY MOUTH EVERY DAY Patient taking differently: Take 75 mg by mouth daily.  02/19/19   Seth [name]  famotidine (PEPCID) 20 MG tablet Take 20 mg by mouth 2 (two) times daily.     Provider, Historical, [name]  folic acid (FOLVITE) 1 MG tablet TAKE 1 TABLET BY MOUTH EVERY DAY 05/04/19   Seth [name]  hydroxypropyl methylcellulose / hypromellose (ISOPTO TEARS / GONIOVISC) 2.5 % ophthalmic solution Place 1 drop into both eyes 3 (three) times daily.    Provider, Historical, [name]  lidocaine-prilocaine (EMLA) cream Apply to Port-A-Cath site 30 minutes before infusion. Patient taking differently: Apply 1 application topically daily as needed. Apply to [facility] for pain -A-Cath site 30 minutes before infusion. 03/10/19   Seth [name]  magic mouthwash SOLN Take 5 mLs by mouth 4 (four) times daily as needed for mouth pain. Patient not taking: Reported on 04/21/2019 04/03/19   Seth [name]  magnesium oxide (MAG-OX) 400 (241.3 Mg) MG tablet Take 1 tablet (400 mg total) by mouth 2 (two) times daily. 04/20/19   Seth [name] [place], [name]  metoprolol tartrate (LOPRESSOR) 25 MG tablet Take 1 tablet (25 mg total) by mouth 2 (two) times daily. Only take if systolic blood pressure is >110 Patient taking differently: Take 25 mg by mouth 2 (two) times daily as needed. Only take if systolic blood pressure is >110 04/06/19   Seth [name]  polyethylene glycol (MIRALAX) 17 g packet Take 17 g by mouth daily as needed for moderate constipation. 04/20/19   Seth [name] [place], [name]  prochlorperazine (COMPAZINE) 10 MG tablet Take 1 tablet (10 mg total) by mouth every 6 (six) hours as needed for nausea or vomiting. 02/03/19   Seth [name]  sodium chloride (OCEAN) 0.65 % SOLN nasal spray Place 1 spray into both nostrils 2 (two) times daily as needed for congestion.    Provider, Historical, [name]    Allergies:   Biaxin [clarithromycin], Clarithromycin, Iodinated diagnostic  agents, Prednisone, Cortisone, Flomax [tamsulosin hcl], Hepatitis b virus vaccines, and Iodine   Social History   Socioeconomic History  . Marital status: Married    Spouse name: Not on file  . Number of children: 1  . Years of education: Not on file  . Highest education level: Not on file  Occupational History  . Occupation: retired  Tobacco Use  . Smoking status: Former Smoker    Packs/day: 3.00    Years: 50.00    Pack years: 150.00    Types: Cigarettes    Quit date: 04/05/2016    Years since quitting: 3.1  . Smokeless tobacco: Never Used  Substance and Sexual Activity  . Alcohol use: Yes    Alcohol/week: 0.0 standard drinks    Comment: rare  . Drug use: No  . Sexual activity: Not on file  Other Topics Concern  . Not on file  Social History Narrative   Married  Exercise: No   Education: GED   Social Determinants of Radio broadcast assistant Strain:   . Difficulty of Paying Living Expenses:   Food Insecurity: No Food Insecurity  . Worried About Charity fundraiser in the Last Year: Never true  . Ran Out of Food in the Last Year: Never true  Transportation Needs: No Transportation Needs  . Lack of Transportation (Medical): No  . Lack of Transportation (Non-Medical): No  Physical Activity:   . Days of Exercise per Week:   . Minutes of Exercise per Session:   Stress:   . Feeling of Stress :   Social Connections:   . Frequency of Communication with Friends and Family:   . Frequency of Social Gatherings with Friends and Family:   . Attends Religious Services:   . Active Member of Clubs or Organizations:   . Attends Archivist Meetings:   Marland Kitchen Marital Status:      Family History:  The patient's family history includes Cancer in his maternal grandmother; Diabetes in his paternal grandfather, paternal grandmother, and sister; Heart disease in his brother, maternal uncle, mother, paternal grandfather, paternal grandmother, and sister; Hyperlipidemia in his  brother, paternal grandfather, paternal grandmother, and sister; Hypertension in his brother and mother; Leukemia in his maternal uncle; Stroke in his mother.   ROS:   Please see the history of present illness.    ROS All other systems reviewed and are negative.   PHYSICAL EXAM:   VS:  BP 122/70   Pulse (!) 108   Ht 6\' 1"  (1.854 m)   Wt 172 lb (78 kg)   SpO2 98%   BMI 22.69 kg/m    GEN: Well nourished, well developed, in no acute distress  HEENT: normal  Neck: no JVD, carotid bruits, or masses Cardiac: RRR; no murmurs, rubs, or gallops,no edema  Respiratory:  clear to auscultation bilaterally, normal work of breathing GI: soft, nontender, nondistended, + BS MS: no deformity or atrophy  Skin: warm and dry, no rash Neuro:  Alert and Oriented x 3, Strength and sensation are intact Psych: euthymic mood, full affect  Wt Readings from Last 3 Encounters:  05/13/19 172 lb (78 kg)  05/11/19 171 lb 6.4 oz (77.7 kg)  04/21/19 181 lb 14.4 oz (82.5 kg)      Studies/Labs Reviewed:   EKG:  EKG is not  ordered today.    Recent Labs: 04/19/2019: Magnesium 1.3 05/04/2019: TSH 2.134 05/11/2019: ALT 12; BUN 17; Creatinine 1.07; Hemoglobin 7.9; Platelet Count 147; Potassium 3.2; Sodium 137   Lipid Panel    Component Value Date/Time   CHOL 132 08/18/2018 0903   TRIG 152 (H) 08/18/2018 0903   HDL 34 ([name]) 08/18/2018 0903   CHOLHDL 3.9 08/18/2018 0903   CHOLHDL 3.9 03/23/2015 0928   VLDL 20 03/23/2015 0928   LDLCALC 68 08/18/2018 0903    Additional studies/ records that were reviewed today include:   Echocardiogram: 04/23/19 1. Left ventricular ejection fraction, by estimation, is 60 to 65%. The  left ventricle has normal function. The left ventricle has no regional  wall motion abnormalities. There is mild concentric left ventricular  hypertrophy. Left ventricular diastolic  parameters are consistent with Grade II diastolic dysfunction  (pseudonormalization). Elevated left ventricular  end-diastolic pressure.  The average left ventricular global longitudinal strain is -19.7 %.  2. Right ventricular systolic function is normal. The right ventricular  size is normal.  3. The mitral valve is normal in structure. Trivial mitral  valve  regurgitation. No evidence of mitral stenosis.  4. The aortic valve is tricuspid. Aortic valve regurgitation is not  visualized. No aortic stenosis is present.  5. The inferior vena cava is dilated in size with >50% respiratory  variability, suggesting right atrial pressure of 8 mmHg.    Stress test 04/2018  Nuclear stress EF: 64%.  The left ventricular ejection fraction is normal (55-65%).  There was no ST segment deviation noted during stress.  The study is normal.  This is a low risk study.   Cardiac Catheterization: 04/2012 ANGIOGRAPHIC DATA:    Left main: Patent, gives rise to LAD and circumflex artery no calcification.  Left anterior descending (LAD): Mild diffuse disease with one significant diagonal branch. Mid LAD portion just after first diagonal branch exhibits 40% stenosis, eccentric. Best seen on straight cranial view.  Circumflex artery (CIRC): 2 significant obtuse marginal branches. Mild luminal irregularities. Small distal caliber vessels.  Right coronary artery (RCA): Moderate diffuse disease, dominant vessel giving rise to the posterior descending artery. Proximal portion of RCA has 30-40% diffuse irregularities. The distal portion of this vessel is relatively small in caliber. There is no flow-limiting coronary disease present.  LEFT VENTRICULOGRAM:  Left ventricular angiogram was done in the 30 RAO projection and revealed normal left ventricular wall motion and systolic function with an estimated ejection fraction of 65 %.   IMPRESSIONS:  1. Minor to moderate diffuse coronary irregularities but no flow-limiting CAD present. 2. Normal left ventricular systolic function.  LVEDP 15 mmHg.  Ejection  fraction 65 %.    ASSESSMENT & PLAN:    1.  CAD -No angina.  Continue Plavix and statin.  2.  Labile blood pressure with intermittent tachycardia -Some orthostatic component related to chemotherapy and poor p.o. intake -Continue low pressure at current dose, hold for systolic blood pressure less than 110 -He knows when to take precautions  3.  Chemotherapy-induced anemia -Hemoglobin 7.9 2 days ago.  Plan to transfuse later this week. He will call Dr. Julien Nordmann office later today  4.  Metastatic lung cancer - on chemo   Medication Adjustments/Labs and Tests Ordered: Current medicines are reviewed at length with the patient today.  Concerns regarding medicines are outlined above.  Medication changes, Labs and Tests ordered today are listed in the Patient Instructions below. Patient Instructions  Medication Instructions:  Your physician recommends that you continue on your current medications as directed. Please refer to the Current Medication list given to you today.  *If you need a refill on your cardiac medications before your next appointment, please call your pharmacy*   Lab Work: None ordered  If you have labs (blood work) drawn today and your tests are completely normal, you will receive your results only by: Marland Kitchen MyChart Message (if you have MyChart) OR . A paper copy in the mail If you have any lab test that is abnormal or we need to change your treatment, we will call you to review the results.   Testing/Procedures: None ordered   Follow-Up: At [facility], you and your health needs are our priority.  As part of our continuing mission to provide you with exceptional heart care, we have created designated Provider Care Teams.  These Care Teams include your primary Cardiologist (physician) and Advanced Practice Providers (APPs -  Physician Assistants and Nurse Practitioners) who all work together to provide you with the care you need, when you need it.  We recommend  signing up for the patient portal called "  MyChart".  Sign up information is provided on this After Visit Summary.  MyChart is used to connect with patients for Virtual Visits (Telemedicine).  Patients are able to view lab/test results, encounter notes, upcoming appointments, etc.  Non-urgent messages can be sent to your provider as well.   To learn more about what you can [name] with MyChart, go to [email or web address].    Your next appointment:   3 month(s)  The format for your next appointment:   In Person  Provider:   You may see Dr. Fransico Him or one of the following Advanced Practice Providers on your designated Care Team:    Melina Copa, [name]  Ermalinda Barrios, [name]    Other Instructions      Signed, Leanor Kail, [place]  05/13/2019 9:16 AM    [facility] [address], [place], [place]  [address] Phone: [phone]; Fax: [phone]

## 2019-05-11 ENCOUNTER — Inpatient Hospital Stay: Payer: Medicare Other

## 2019-05-11 ENCOUNTER — Other Ambulatory Visit: Payer: Self-pay

## 2019-05-11 ENCOUNTER — Inpatient Hospital Stay: Payer: Medicare Other | Attending: Internal Medicine | Admitting: Internal Medicine

## 2019-05-11 ENCOUNTER — Encounter: Payer: Self-pay | Admitting: Internal Medicine

## 2019-05-11 VITALS — HR 110

## 2019-05-11 VITALS — BP 109/69 | HR 125 | Temp 98.7°F | Resp 17 | Ht 73.0 in | Wt 171.4 lb

## 2019-05-11 DIAGNOSIS — R112 Nausea with vomiting, unspecified: Secondary | ICD-10-CM | POA: Diagnosis not present

## 2019-05-11 DIAGNOSIS — M5137 Other intervertebral disc degeneration, lumbosacral region: Secondary | ICD-10-CM | POA: Diagnosis not present

## 2019-05-11 DIAGNOSIS — Z5111 Encounter for antineoplastic chemotherapy: Secondary | ICD-10-CM | POA: Insufficient documentation

## 2019-05-11 DIAGNOSIS — I2510 Atherosclerotic heart disease of native coronary artery without angina pectoris: Secondary | ICD-10-CM | POA: Diagnosis not present

## 2019-05-11 DIAGNOSIS — K5900 Constipation, unspecified: Secondary | ICD-10-CM | POA: Insufficient documentation

## 2019-05-11 DIAGNOSIS — I739 Peripheral vascular disease, unspecified: Secondary | ICD-10-CM | POA: Insufficient documentation

## 2019-05-11 DIAGNOSIS — C3492 Malignant neoplasm of unspecified part of left bronchus or lung: Secondary | ICD-10-CM

## 2019-05-11 DIAGNOSIS — E861 Hypovolemia: Secondary | ICD-10-CM

## 2019-05-11 DIAGNOSIS — R000 Tachycardia, unspecified: Secondary | ICD-10-CM | POA: Insufficient documentation

## 2019-05-11 DIAGNOSIS — D6481 Anemia due to antineoplastic chemotherapy: Secondary | ICD-10-CM | POA: Diagnosis not present

## 2019-05-11 DIAGNOSIS — R599 Enlarged lymph nodes, unspecified: Secondary | ICD-10-CM | POA: Insufficient documentation

## 2019-05-11 DIAGNOSIS — I959 Hypotension, unspecified: Secondary | ICD-10-CM | POA: Insufficient documentation

## 2019-05-11 DIAGNOSIS — J984 Other disorders of lung: Secondary | ICD-10-CM | POA: Insufficient documentation

## 2019-05-11 DIAGNOSIS — C7972 Secondary malignant neoplasm of left adrenal gland: Secondary | ICD-10-CM | POA: Diagnosis not present

## 2019-05-11 DIAGNOSIS — T451X5A Adverse effect of antineoplastic and immunosuppressive drugs, initial encounter: Secondary | ICD-10-CM | POA: Insufficient documentation

## 2019-05-11 DIAGNOSIS — I9589 Other hypotension: Secondary | ICD-10-CM

## 2019-05-11 DIAGNOSIS — Z5112 Encounter for antineoplastic immunotherapy: Secondary | ICD-10-CM | POA: Insufficient documentation

## 2019-05-11 DIAGNOSIS — C7971 Secondary malignant neoplasm of right adrenal gland: Secondary | ICD-10-CM | POA: Insufficient documentation

## 2019-05-11 DIAGNOSIS — J439 Emphysema, unspecified: Secondary | ICD-10-CM | POA: Insufficient documentation

## 2019-05-11 DIAGNOSIS — R5383 Other fatigue: Secondary | ICD-10-CM | POA: Diagnosis not present

## 2019-05-11 DIAGNOSIS — Z79899 Other long term (current) drug therapy: Secondary | ICD-10-CM | POA: Insufficient documentation

## 2019-05-11 DIAGNOSIS — R0609 Other forms of dyspnea: Secondary | ICD-10-CM | POA: Insufficient documentation

## 2019-05-11 DIAGNOSIS — R55 Syncope and collapse: Secondary | ICD-10-CM | POA: Insufficient documentation

## 2019-05-11 DIAGNOSIS — Z95828 Presence of other vascular implants and grafts: Secondary | ICD-10-CM

## 2019-05-11 DIAGNOSIS — Z888 Allergy status to other drugs, medicaments and biological substances status: Secondary | ICD-10-CM | POA: Insufficient documentation

## 2019-05-11 DIAGNOSIS — M5136 Other intervertebral disc degeneration, lumbar region: Secondary | ICD-10-CM | POA: Insufficient documentation

## 2019-05-11 DIAGNOSIS — D649 Anemia, unspecified: Secondary | ICD-10-CM

## 2019-05-11 DIAGNOSIS — N400 Enlarged prostate without lower urinary tract symptoms: Secondary | ICD-10-CM | POA: Diagnosis not present

## 2019-05-11 DIAGNOSIS — C7951 Secondary malignant neoplasm of bone: Secondary | ICD-10-CM | POA: Diagnosis not present

## 2019-05-11 DIAGNOSIS — E876 Hypokalemia: Secondary | ICD-10-CM

## 2019-05-11 DIAGNOSIS — R590 Localized enlarged lymph nodes: Secondary | ICD-10-CM | POA: Insufficient documentation

## 2019-05-11 DIAGNOSIS — C3411 Malignant neoplasm of upper lobe, right bronchus or lung: Secondary | ICD-10-CM | POA: Diagnosis not present

## 2019-05-11 DIAGNOSIS — R42 Dizziness and giddiness: Secondary | ICD-10-CM | POA: Insufficient documentation

## 2019-05-11 DIAGNOSIS — Z87891 Personal history of nicotine dependence: Secondary | ICD-10-CM | POA: Insufficient documentation

## 2019-05-11 DIAGNOSIS — Z9221 Personal history of antineoplastic chemotherapy: Secondary | ICD-10-CM | POA: Insufficient documentation

## 2019-05-11 DIAGNOSIS — Z923 Personal history of irradiation: Secondary | ICD-10-CM | POA: Insufficient documentation

## 2019-05-11 DIAGNOSIS — I119 Hypertensive heart disease without heart failure: Secondary | ICD-10-CM | POA: Diagnosis not present

## 2019-05-11 LAB — CBC WITH DIFFERENTIAL (CANCER CENTER ONLY)
Abs Immature Granulocytes: 0.05 10*3/uL (ref 0.00–0.07)
Basophils Absolute: 0 10*3/uL (ref 0.0–0.1)
Basophils Relative: 0 %
Eosinophils Absolute: 0 10*3/uL (ref 0.0–0.5)
Eosinophils Relative: 0 %
HCT: 24.5 % — ABNORMAL LOW (ref 39.0–52.0)
Hemoglobin: 7.9 g/dL — ABNORMAL LOW (ref 13.0–17.0)
Immature Granulocytes: 1 %
Lymphocytes Relative: 9 %
Lymphs Abs: 0.5 10*3/uL — ABNORMAL LOW (ref 0.7–4.0)
MCH: 30.4 pg (ref 26.0–34.0)
MCHC: 32.2 g/dL (ref 30.0–36.0)
MCV: 94.2 fL (ref 80.0–100.0)
Monocytes Absolute: 0.9 10*3/uL (ref 0.1–1.0)
Monocytes Relative: 17 %
Neutro Abs: 3.8 10*3/uL (ref 1.7–7.7)
Neutrophils Relative %: 73 %
Platelet Count: 147 10*3/uL — ABNORMAL LOW (ref 150–400)
RBC: 2.6 MIL/uL — ABNORMAL LOW (ref 4.22–5.81)
RDW: 22.6 % — ABNORMAL HIGH (ref 11.5–15.5)
WBC Count: 5.2 10*3/uL (ref 4.0–10.5)
nRBC: 0 % (ref 0.0–0.2)

## 2019-05-11 LAB — CMP (CANCER CENTER ONLY)
ALT: 12 U/L (ref 0–44)
AST: 25 U/L (ref 15–41)
Albumin: 3.6 g/dL (ref 3.5–5.0)
Alkaline Phosphatase: 154 U/L — ABNORMAL HIGH (ref 38–126)
Anion gap: 17 — ABNORMAL HIGH (ref 5–15)
BUN: 17 mg/dL (ref 8–23)
CO2: 22 mmol/L (ref 22–32)
Calcium: 9.3 mg/dL (ref 8.9–10.3)
Chloride: 98 mmol/L (ref 98–111)
Creatinine: 1.07 mg/dL (ref 0.61–1.24)
GFR, Est AFR Am: >60 mL/min (ref >60)
GFR, Estimated: >60 mL/min (ref >60)
Glucose, Bld: 117 mg/dL — ABNORMAL HIGH (ref 70–99)
Potassium: 3.2 mmol/L — ABNORMAL LOW (ref 3.5–5.1)
Sodium: 137 mmol/L (ref 135–145)
Total Bilirubin: 1.2 mg/dL (ref 0.3–1.2)
Total Protein: 7.2 g/dL (ref 6.5–8.1)

## 2019-05-11 MED ORDER — SODIUM CHLORIDE 0.9 % IV SOLN
Freq: Once | INTRAVENOUS | Status: AC
  Filled 2019-05-11: qty 250

## 2019-05-11 MED ORDER — HEPARIN SOD (PORK) LOCK FLUSH 100 UNIT/ML IV SOLN
500.0000 [IU] | Freq: Once | INTRAVENOUS | Status: DC
  Filled 2019-05-11: qty 5

## 2019-05-11 MED ORDER — SODIUM CHLORIDE 0.9 % IV SOLN
200.0000 mg | Freq: Once | INTRAVENOUS | Status: AC
  Administered 2019-05-11: 200 mg via INTRAVENOUS
  Filled 2019-05-11: qty 8

## 2019-05-11 MED ORDER — SODIUM CHLORIDE 0.9 % IV SOLN
5.0000 mg | Freq: Once | INTRAVENOUS | Status: AC
  Administered 2019-05-11: 5 mg via INTRAVENOUS
  Filled 2019-05-11: qty 0.5

## 2019-05-11 MED ORDER — SODIUM CHLORIDE 0.9 % IV SOLN
500.0000 mg/m2 | Freq: Once | INTRAVENOUS | Status: AC
  Administered 2019-05-11: 1100 mg via INTRAVENOUS
  Filled 2019-05-11: qty 40

## 2019-05-11 MED ORDER — HEPARIN SOD (PORK) LOCK FLUSH 100 UNIT/ML IV SOLN
500.0000 [IU] | Freq: Once | INTRAVENOUS | Status: AC | PRN
  Administered 2019-05-11: 500 [IU]
  Filled 2019-05-11: qty 5

## 2019-05-11 MED ORDER — SODIUM CHLORIDE 0.9% FLUSH
10.0000 mL | INTRAVENOUS | Status: DC | PRN
  Administered 2019-05-11: 10 mL
  Filled 2019-05-11: qty 10

## 2019-05-11 MED ORDER — SODIUM CHLORIDE 0.9% FLUSH
10.0000 mL | INTRAVENOUS | Status: DC | PRN
  Administered 2019-05-11: 12:00:00 10 mL via INTRAVENOUS
  Filled 2019-05-11: qty 10

## 2019-05-11 MED ORDER — DIPHENHYDRAMINE HCL 50 MG/ML IJ SOLN
25.0000 mg | Freq: Once | INTRAMUSCULAR | Status: AC
  Administered 2019-05-11: 25 mg via INTRAVENOUS

## 2019-05-11 MED ORDER — PROCHLORPERAZINE MALEATE 10 MG PO TABS
ORAL_TABLET | ORAL | Status: AC
  Filled 2019-05-11: qty 1

## 2019-05-11 MED ORDER — POTASSIUM CHLORIDE 10 MEQ/100ML IV SOLN
10.0000 meq | INTRAVENOUS | Status: AC
  Administered 2019-05-11 (×2): 10 meq via INTRAVENOUS

## 2019-05-11 MED ORDER — DIPHENHYDRAMINE HCL 50 MG/ML IJ SOLN
INTRAMUSCULAR | Status: AC
  Filled 2019-05-11: qty 1

## 2019-05-11 MED ORDER — PROCHLORPERAZINE MALEATE 10 MG PO TABS
10.0000 mg | ORAL_TABLET | Freq: Once | ORAL | Status: AC
  Administered 2019-05-11: 10 mg via ORAL

## 2019-05-11 MED ORDER — POTASSIUM CHLORIDE 10 MEQ/100ML IV SOLN
INTRAVENOUS | Status: AC
  Filled 2019-05-11: qty 200

## 2019-05-11 MED ORDER — DEXAMETHASONE SODIUM PHOSPHATE 10 MG/ML IJ SOLN
INTRAMUSCULAR | Status: AC
  Filled 2019-05-11: qty 1

## 2019-05-11 MED ORDER — SODIUM CHLORIDE 0.9 % IV SOLN
INTRAVENOUS | Status: DC
  Filled 2019-05-11: qty 250

## 2019-05-11 NOTE — Progress Notes (Signed)
[place] [phone]   [phone]  OFFICE PROGRESS NOTE  [name], MD [place] [address]  DIAGNOSIS: Metastatic non-small cell lung cancer, adenocarcinoma initially diagnosed as stage IIB (T1c, N1, M0) non-small cell lung cancer favoring adenocarcinoma presented with right upper lobe lung nodule in addition to right hilar lymphadenopathy diagnosed in February 2020.  The patient is not a good surgical candidate for resection.  He had evidence for disease recurrence in December 2020 with adrenal gland metastasis.  PRIOR THERAPY: Concurrent chemoradiation with weekly carboplatin for AUC of 2 and paclitaxel 45 mg/M2.  First dose May 05, 2018.  Status post 7 cycles.  Last dose was given Jun 16, 2018  CURRENT THERAPY: Systemic chemotherapy with carboplatin for AUC of 5, Alimta 500 mg/M2 and Keytruda 200 mg IV every 3 weeks.  First dose starting February 17, 2019. Status post 4 cycles.  Starting from cycle #5 he will be treated with maintenance Alimta and Keytruda every 3 weeks.  INTERVAL HISTORY: Seth [name] 74 y.o. male returns to the clinic today for follow-up visit.  His daughter was available by phone during the visit.  The patient is feeling fatigued and tired today.  He received the second dose of Covid vaccine late last week and he has been complaining of the symptoms since his injection.  He denied having any current chest pain, shortness of breath except with exertion with no cough or hemoptysis.  He denied having any fever or chills.  He has no nausea, vomiting, diarrhea or constipation.  He denied having any headache or visual changes.  He tolerated the last cycle of his treatment fairly well.  He is here today for evaluation before starting cycle #5.   MEDICAL HISTORY: Past Medical History:  Diagnosis Date  . AAA (abdominal aortic aneurysm) ([place])   . Allergic rhinitis   . Anxiety   . Back pain   . CAD (coronary  artery disease)   . Cataracts, bilateral   . Emphysema, unspecified ([place])     " moderate"  . Essential hypertension, benign   . GERD (gastroesophageal reflux disease)   . Gout   . Headache    migraines  . Hilar adenopathy   . History of hiatal hernia   . History of sciatica   . Hyperlipidemia   . lung ca dx'd 02/2018  . Lung nodule   . Pneumonia    as child  . Prostate hypertrophy   . PVD (peripheral vascular disease) ([place])   . Seasonal allergies   . Subclavian steal syndrome of left subclavian artery   . Wears dentures     ALLERGIES:  is allergic to biaxin [clarithromycin]; clarithromycin; iodinated diagnostic agents; prednisone; cortisone; flomax [tamsulosin hcl]; hepatitis b virus vaccines; and iodine.  MEDICATIONS:  Current Outpatient Medications  Medication Sig Dispense Refill  . atorvastatin (LIPITOR) 40 MG tablet TAKE 1 TABLET BY MOUTH EVERY DAY 90 tablet 3  . cetirizine (ZYRTEC) 10 MG tablet Take 10 mg by mouth daily at 12 noon.    . clopidogrel (PLAVIX) 75 MG tablet TAKE 1 TABLET BY MOUTH EVERY DAY (Patient taking differently: Take 75 mg by mouth daily. ) 30 tablet 2  . famotidine (PEPCID) 20 MG tablet Take 20 mg by mouth 2 (two) times daily.     . folic acid (FOLVITE) 1 MG tablet TAKE 1 TABLET BY MOUTH EVERY DAY 90 tablet 1  . hydroxypropyl methylcellulose / hypromellose (ISOPTO  TEARS / GONIOVISC) 2.5 % ophthalmic solution Place 1 drop into both eyes 3 (three) times daily.    Marland Kitchen lidocaine-prilocaine (EMLA) cream Apply to Port-A-Cath site 30 minutes before infusion. (Patient taking differently: Apply 1 application topically daily as needed. Apply to [facility] for pain -A-Cath site 30 minutes before infusion.) 30 g 0  . magic mouthwash SOLN Take 5 mLs by mouth 4 (four) times daily as needed for mouth pain. (Patient not taking: Reported on 04/21/2019) 240 mL 0  . magnesium oxide (MAG-OX) 400 (241.3 Mg) MG tablet Take 1 tablet (400 mg total) by mouth 2 (two) times daily. 60 tablet  0  . metoprolol tartrate (LOPRESSOR) 25 MG tablet Take 1 tablet (25 mg total) by mouth 2 (two) times daily. Only take if systolic blood pressure is >110 (Patient taking differently: Take 25 mg by mouth 2 (two) times daily as needed. Only take if systolic blood pressure is >110) 180 tablet 3  . polyethylene glycol (MIRALAX) 17 g packet Take 17 g by mouth daily as needed for moderate constipation. 14 each 0  . prochlorperazine (COMPAZINE) 10 MG tablet Take 1 tablet (10 mg total) by mouth every 6 (six) hours as needed for nausea or vomiting. 30 tablet 0  . sodium chloride (OCEAN) 0.65 % SOLN nasal spray Place 1 spray into both nostrils 2 (two) times daily as needed for congestion.     No current facility-administered medications for this visit.   Facility-Administered Medications Ordered in Other Visits  Medication Dose Route Frequency Provider Last Rate Last Admin  . heparin lock flush 100 unit/mL  500 Units Intravenous Once Curt Bears, MD      . sodium chloride flush (NS) 0.9 % injection 10 mL  10 mL Intravenous PRN Curt Bears, MD   10 mL at 04/21/19 1306  . sodium chloride flush (NS) 0.9 % injection 3 mL  3 mL Intracatheter PRN Curt Bears, MD        SURGICAL HISTORY:  Past Surgical History:  Procedure Laterality Date  . CARDIAC CATHETERIZATION     stent placement  . COLONOSCOPY W/ BIOPSIES AND POLYPECTOMY    . ILIAC ARTERY STENT    . IR IMAGING GUIDED PORT INSERTION  03/23/2019  . MULTIPLE TOOTH EXTRACTIONS    . VIDEO BRONCHOSCOPY WITH ENDOBRONCHIAL NAVIGATION N/A 02/26/2018   Procedure: VIDEO BRONCHOSCOPY WITH ENDOBRONCHIAL NAVIGATION;  Surgeon: Garner Nash, DO;  Location: [facility];  Service: Thoracic;  Laterality: N/A;  . VIDEO BRONCHOSCOPY WITH ENDOBRONCHIAL NAVIGATION N/A 03/19/2018   Procedure: VIDEO BRONCHOSCOPY WITH ENDOBRONCHIAL NAVIGATION and endobronchial ultrasound;  Surgeon: Garner Nash, DO;  Location: [facility];  Service: Thoracic;  Laterality: N/A;  . VIDEO  BRONCHOSCOPY WITH ENDOBRONCHIAL ULTRASOUND N/A 02/26/2018   Procedure: VIDEO BRONCHOSCOPY WITH ENDOBRONCHIAL ULTRASOUND;  Surgeon: Garner Nash, DO;  Location: [facility];  Service: Thoracic;  Laterality: N/A;  . VIDEO BRONCHOSCOPY WITH ENDOBRONCHIAL ULTRASOUND N/A 03/19/2018   Procedure: NAVIGATION BRONCHOSCOPY;  Surgeon: Garner Nash, DO;  Location: [facility];  Service: Thoracic;  Laterality: N/A;    REVIEW OF SYSTEMS:  Constitutional: positive for fatigue and weight loss Eyes: negative Ears, nose, mouth, throat, and face: negative Respiratory: positive for dyspnea on exertion Cardiovascular: negative Gastrointestinal: negative Genitourinary:negative Integument/breast: negative Hematologic/lymphatic: negative Musculoskeletal:negative Neurological: negative Behavioral/Psych: negative Endocrine: negative Allergic/Immunologic: negative   PHYSICAL EXAMINATION: General appearance: alert, cooperative, fatigued and no distress Head: Normocephalic, without obvious abnormality, atraumatic Neck: no adenopathy, no JVD, supple, symmetrical, trachea midline and thyroid not enlarged,  symmetric, no tenderness/mass/nodules Lymph nodes: Cervical, supraclavicular, and axillary nodes normal. Resp: clear to auscultation bilaterally Back: symmetric, no curvature. ROM normal. No CVA tenderness. Cardio: regular rate and rhythm, S1, S2 normal, no murmur, click, rub or gallop GI: soft, non-tender; bowel sounds normal; no masses,  no organomegaly Extremities: extremities normal, atraumatic, no cyanosis or edema Neurologic: Alert and oriented X 3, normal strength and tone. Normal symmetric reflexes. Normal coordination and gait  ECOG PERFORMANCE STATUS: 1 - Symptomatic but completely ambulatory  Blood pressure 109/69, pulse (!) 125, temperature 98.7 F (37.1 C), temperature source Temporal, resp. rate 17, height 6\' 1"  (1.854 m), weight 171 lb 6.4 oz (77.7 kg), SpO2 100 %.   LABORATORY DATA: Lab Results    Component Value Date   WBC 5.2 05/11/2019   HGB 7.9 (L) 05/11/2019   HCT 24.5 (L) 05/11/2019   MCV 94.2 05/11/2019   PLT 147 (L) 05/11/2019      Chemistry      Component Value Date/Time   NA 137 04/28/2019 1424   NA 133 (L) 01/19/2019 1457   K 3.9 04/28/2019 1424   CL 100 04/28/2019 1424   CO2 24 04/28/2019 1424   BUN 14 04/28/2019 1424   BUN 9 01/19/2019 1457   CREATININE 0.91 04/28/2019 1424   CREATININE 1.09 03/23/2015 0928      Component Value Date/Time   CALCIUM 9.4 04/28/2019 1424   ALKPHOS 148 (H) 04/28/2019 1424   AST 21 04/28/2019 1424   ALT 28 04/28/2019 1424   BILITOT 0.8 04/28/2019 1424       RADIOGRAPHIC STUDIES: CT Abdomen Pelvis Wo Contrast  Result Date: 04/17/2019 CLINICAL DATA:  Non-small cell left upper lobe lung cancer initially treated with chemotherapy and radiation therapy completed May 2020, with recurrent metastatic disease to the bones and adrenal glands. Restaging. EXAM: CT CHEST, ABDOMEN AND PELVIS WITHOUT CONTRAST TECHNIQUE: Multidetector CT imaging of the chest, abdomen and pelvis was performed following the standard protocol without IV contrast. COMPARISON:  01/22/2019 PET-CT. 01/13/2019 CT chest, abdomen and pelvis. FINDINGS: CT CHEST FINDINGS Cardiovascular: Normal heart size. No significant pericardial effusion/thickening. Three-vessel coronary atherosclerosis. Right internal jugular Port-A-Cath terminates at the cavoatrial junction. Atherosclerotic nonaneurysmal thoracic aorta. Normal caliber pulmonary arteries. Mediastinum/Nodes: No discrete thyroid nodules. Unremarkable esophagus. No axillary adenopathy. Newly enlarged 1.1 cm AP window node (series 2/image 27). No additional pathologically enlarged mediastinal nodes. No discrete hilar adenopathy on these noncontrast images. Lungs/Pleura: No pneumothorax. No pleural effusion. Moderate centrilobular and paraseptal emphysema with mild diffuse bronchial wall thickening. Numerous (greater than 20)  small solid pulmonary nodules scattered throughout both lungs, increased in size and number. For example a 5 mm posterior right upper lobe nodule (series 6/image 61), previously 2 mm on 01/13/2019 CT. Right middle lobe 6 mm nodule (series 6/image 86), increased from 2 mm. Left lower lobe 4 mm nodule (series 6/image 98), increased from 1 mm. Sharply marginated left parahilar consolidation with mild volume loss and distortion, compatible with radiation change. Musculoskeletal: Left posterior element T9 and less discrete T5 sclerotic osseous lesions, increased in sclerosis (series 5/images 88 and 89). Mild thoracic spondylosis. CT ABDOMEN PELVIS FINDINGS Hepatobiliary: Normal liver with no liver mass. Normal gallbladder with no radiopaque cholelithiasis. No biliary ductal dilatation. Pancreas: Normal, with no mass or duct dilation. Spleen: Normal size. No mass. Adrenals/Urinary Tract: Right adrenal 3.6 cm mass, previously 3.4 cm, slightly increased. Left adrenal 3.3 cm mass, previously 2.8 cm, slightly increased. No hydronephrosis. No renal stones. No contour deforming renal  masses. Normal bladder. Stomach/Bowel: Normal non-distended stomach. Normal caliber small bowel with no small bowel wall thickening. Normal appendix. Oral contrast transits to the colon. Normal large bowel with no diverticulosis, large bowel wall thickening or pericolonic fat stranding. Vascular/Lymphatic: Atherosclerotic abdominal aorta with 4.8 cm infrarenal abdominal aortic aneurysm, stable using similar measurement technique. No pathologically enlarged lymph nodes in the abdomen or pelvis. Reproductive: Mildly enlarged prostate. Other: No pneumoperitoneum, ascites or focal fluid collection. Musculoskeletal: Predominantly lytic posterior left acetabular 3.1 cm lesion (series 2/image 120), increased from 1.7 cm. Chronic bilateral L5 pars defects with severe degenerative disc disease and 10 mm anterolisthesis at L5-S1. IMPRESSION: 1. Numerous  small solid pulmonary nodules scattered throughout both lungs, increased in size and number, compatible with progressive pulmonary metastases. 2. New mild AP window adenopathy, suspicious for nodal metastasis. 3. Bilateral adrenal metastases are slightly increased in size. 4. Predominantly lytic posterior left acetabular 3.1 cm metastasis is increased in size. Increased sclerosis/conspicuity of left posterior element T9 and T5 sclerotic osseous lesions. Findings are compatible with progressive osseous metastases. 5. Stable 4.8 cm infrarenal abdominal aortic aneurysm. Abdominal Aortic Aneurysm (ICD10-I71.9). Recommend follow-up CTA of the abdomen and pelvis in 6 months and vascular surgery referral/consultation. This recommendation follows ACR consensus guidelines: White Paper of the ACR Incidental Findings Committee II on Vascular Findings. J Am Coll Radiol 2013; 10:789-794. 6. Aortic Atherosclerosis (ICD10-I70.0). Additional chronic findings as detailed. Electronically Signed   By: Ilona Sorrel M.D.   On: 04/17/2019 15:53   CT Chest Wo Contrast  Result Date: 04/17/2019 CLINICAL DATA:  Non-small cell left upper lobe lung cancer initially treated with chemotherapy and radiation therapy completed May 2020, with recurrent metastatic disease to the bones and adrenal glands. Restaging. EXAM: CT CHEST, ABDOMEN AND PELVIS WITHOUT CONTRAST TECHNIQUE: Multidetector CT imaging of the chest, abdomen and pelvis was performed following the standard protocol without IV contrast. COMPARISON:  01/22/2019 PET-CT. 01/13/2019 CT chest, abdomen and pelvis. FINDINGS: CT CHEST FINDINGS Cardiovascular: Normal heart size. No significant pericardial effusion/thickening. Three-vessel coronary atherosclerosis. Right internal jugular Port-A-Cath terminates at the cavoatrial junction. Atherosclerotic nonaneurysmal thoracic aorta. Normal caliber pulmonary arteries. Mediastinum/Nodes: No discrete thyroid nodules. Unremarkable esophagus. No  axillary adenopathy. Newly enlarged 1.1 cm AP window node (series 2/image 27). No additional pathologically enlarged mediastinal nodes. No discrete hilar adenopathy on these noncontrast images. Lungs/Pleura: No pneumothorax. No pleural effusion. Moderate centrilobular and paraseptal emphysema with mild diffuse bronchial wall thickening. Numerous (greater than 20) small solid pulmonary nodules scattered throughout both lungs, increased in size and number. For example a 5 mm posterior right upper lobe nodule (series 6/image 61), previously 2 mm on 01/13/2019 CT. Right middle lobe 6 mm nodule (series 6/image 86), increased from 2 mm. Left lower lobe 4 mm nodule (series 6/image 98), increased from 1 mm. Sharply marginated left parahilar consolidation with mild volume loss and distortion, compatible with radiation change. Musculoskeletal: Left posterior element T9 and less discrete T5 sclerotic osseous lesions, increased in sclerosis (series 5/images 88 and 89). Mild thoracic spondylosis. CT ABDOMEN PELVIS FINDINGS Hepatobiliary: Normal liver with no liver mass. Normal gallbladder with no radiopaque cholelithiasis. No biliary ductal dilatation. Pancreas: Normal, with no mass or duct dilation. Spleen: Normal size. No mass. Adrenals/Urinary Tract: Right adrenal 3.6 cm mass, previously 3.4 cm, slightly increased. Left adrenal 3.3 cm mass, previously 2.8 cm, slightly increased. No hydronephrosis. No renal stones. No contour deforming renal masses. Normal bladder. Stomach/Bowel: Normal non-distended stomach. Normal caliber small bowel with no small bowel wall thickening.  Normal appendix. Oral contrast transits to the colon. Normal large bowel with no diverticulosis, large bowel wall thickening or pericolonic fat stranding. Vascular/Lymphatic: Atherosclerotic abdominal aorta with 4.8 cm infrarenal abdominal aortic aneurysm, stable using similar measurement technique. No pathologically enlarged lymph nodes in the abdomen or  pelvis. Reproductive: Mildly enlarged prostate. Other: No pneumoperitoneum, ascites or focal fluid collection. Musculoskeletal: Predominantly lytic posterior left acetabular 3.1 cm lesion (series 2/image 120), increased from 1.7 cm. Chronic bilateral L5 pars defects with severe degenerative disc disease and 10 mm anterolisthesis at L5-S1. IMPRESSION: 1. Numerous small solid pulmonary nodules scattered throughout both lungs, increased in size and number, compatible with progressive pulmonary metastases. 2. New mild AP window adenopathy, suspicious for nodal metastasis. 3. Bilateral adrenal metastases are slightly increased in size. 4. Predominantly lytic posterior left acetabular 3.1 cm metastasis is increased in size. Increased sclerosis/conspicuity of left posterior element T9 and T5 sclerotic osseous lesions. Findings are compatible with progressive osseous metastases. 5. Stable 4.8 cm infrarenal abdominal aortic aneurysm. Abdominal Aortic Aneurysm (ICD10-I71.9). Recommend follow-up CTA of the abdomen and pelvis in 6 months and vascular surgery referral/consultation. This recommendation follows ACR consensus guidelines: White Paper of the ACR Incidental Findings Committee II on Vascular Findings. J Am Coll Radiol 2013; 10:789-794. 6. Aortic Atherosclerosis (ICD10-I70.0). Additional chronic findings as detailed. Electronically Signed   By: Ilona Sorrel M.D.   On: 04/17/2019 15:53   NM Pulmonary Perfusion  Result Date: 04/18/2019 CLINICAL DATA:  PE suspected, low to intermediate probability, negative D-dimer EXAM: NUCLEAR MEDICINE PERFUSION LUNG SCAN TECHNIQUE: Perfusion images were obtained in multiple projections after intravenous injection of radiopharmaceutical. Ventilation scans intentionally deferred if perfusion scan and chest x-ray adequate for interpretation during COVID 19 epidemic. RADIOPHARMACEUTICALS:  1.4 millicuries mCi [id number] MAA IV COMPARISON:  CT 04/17/2019, radiograph 03/20/2018 FINDINGS:  Photopenic fissure sign in the left lung corresponds to the region of scarring and architectural distortion seen in the left lung on comparison CT and chest radiograph. Remaining portions of the lungs perfuse normally without segmental filling defect to suggest pulmonary artery embolism. IMPRESSION: 'Fissure' sign in the left mid lung corresponding to region of scarring and architectural distortion on CT and radiograph. No other perfusion defects to suggest pulmonary embolism. Electronically Signed   By: Lovena Le M.D.   On: 04/18/2019 23:59   DG Chest Portable 1 View  Result Date: 04/18/2019 CLINICAL DATA:  Hypotension. Near syncope. Dizziness. EXAM: PORTABLE CHEST 1 VIEW COMPARISON:  Chest CT yesterday. FINDINGS: Right chest port remains in place. Chronic hyperinflation with emphysema. Volume loss in the left hemithorax with left perihilar opacities, stable from CT yesterday. Heart is normal in size. Aortic atherosclerosis. No acute airspace disease, pulmonary edema, pneumothorax or large pleural effusion. Pulmonary nodules on CT yesterday are not well demonstrated radiographically no acute osseous abnormalities are seen. Enteric contrast in the splenic flexure of the colon from yesterday's CT. IMPRESSION: 1. No acute findings. 2. Sequela of left lung cancer with volume loss in the left hemithorax and perihilar opacities, assessed on CT yesterday. 3. Hyperinflation and emphysema.  Aortic atherosclerosis. Aortic Atherosclerosis (ICD10-I70.0) and Emphysema (ICD10-J43.9). Electronically Signed   By: Keith Rake M.D.   On: 04/18/2019 16:05   ECHOCARDIOGRAM COMPLETE  Result Date: 04/23/2019    ECHOCARDIOGRAM REPORT   Patient Name:   Seth [name]  Date of Exam: 04/23/2019 Medical Rec #:  [id number]     Height:       73.0 in Accession #:    [id number]  Weight:       181.9 lb Date of Birth:  Apr 06, 1945     BSA:          2.067 m Patient Age:    29 years      BP:           110/78 mmHg Patient Gender: M              HR:           86 bpm. Exam Location:  [facility] Procedure: 2D Echo, 3D Echo, Cardiac Doppler, Color Doppler and Strain Analysis Indications:    Z09 or V67.2 Chemotherapy  History:        Patient has prior history of Echocardiogram examinations, most                 recent 04/08/2018. CAD, AAA/PVD; Risk Factors:Hypertension,                 Dyslipidemia and Former Smoker. Metastatic lung cancer with                 ongoing chemotherapy.  Sonographer:    Lenard Galloway BA, RDCS Referring Phys: [place]  1. Left ventricular ejection fraction, by estimation, is 60 to 65%. The left ventricle has normal function. The left ventricle has no regional wall motion abnormalities. There is mild concentric left ventricular hypertrophy. Left ventricular diastolic parameters are consistent with Grade II diastolic dysfunction (pseudonormalization). Elevated left ventricular end-diastolic pressure. The average left ventricular global longitudinal strain is -19.7 %.  2. Right ventricular systolic function is normal. The right ventricular size is normal.  3. The mitral valve is normal in structure. Trivial mitral valve regurgitation. No evidence of mitral stenosis.  4. The aortic valve is tricuspid. Aortic valve regurgitation is not visualized. No aortic stenosis is present.  5. The inferior vena cava is dilated in size with >50% respiratory variability, suggesting right atrial pressure of 8 mmHg. FINDINGS  Left Ventricle: Left ventricular ejection fraction, by estimation, is 60 to 65%. The left ventricle has normal function. The left ventricle has no regional wall motion abnormalities. The average left ventricular global longitudinal strain is -19.7 %. The left ventricular internal cavity size was normal in size. There is mild concentric left ventricular hypertrophy. Left ventricular diastolic parameters are consistent with Grade II diastolic dysfunction (pseudonormalization). Elevated left ventricular   end-diastolic pressure. Right Ventricle: The right ventricular size is normal. No increase in right ventricular wall thickness. Right ventricular systolic function is normal. Left Atrium: Left atrial size was normal in size. Right Atrium: Right atrial size was normal in size. Pericardium: There is no evidence of pericardial effusion. Mitral Valve: The mitral valve is normal in structure. Normal mobility of the mitral valve leaflets. Trivial mitral valve regurgitation. No evidence of mitral valve stenosis. Tricuspid Valve: The tricuspid valve is normal in structure. Tricuspid valve regurgitation is not demonstrated. No evidence of tricuspid stenosis. Aortic Valve: The aortic valve is tricuspid. . There is mild thickening and mild calcification of the aortic valve. Aortic valve regurgitation is not visualized. No aortic stenosis is present. There is mild thickening of the aortic valve. There is mild calcification of the aortic valve. Pulmonic Valve: The pulmonic valve was normal in structure. Pulmonic valve regurgitation is not visualized. No evidence of pulmonic stenosis. Aorta: The aortic root is normal in size and structure. Venous: The inferior vena cava is dilated in size with greater than 50% respiratory variability, suggesting right atrial  pressure of 8 mmHg. IAS/Shunts: No atrial level shunt detected by color flow Doppler.  LEFT VENTRICLE PLAX 2D LVIDd:         4.12 cm  Diastology LVIDs:         2.20 cm  LV e' lateral:   7.62 cm/s LV PW:         1.24 cm  LV E/e' lateral: 11.9 LV IVS:        1.28 cm  LV e' medial:    4.57 cm/s LVOT diam:     2.10 cm  LV E/e' medial:  19.8 LV SV:         82 LV SV Index:   40       2D Longitudinal Strain LVOT Area:     3.46 cm 2D Strain GLS (A2C):   -19.0 %                         2D Strain GLS (A3C):   -20.0 %                         2D Strain GLS (A4C):   -20.0 %                         2D Strain GLS Avg:     -19.7 %                          3D Volume EF:                          3D EF:        62 %                         LV EDV:       116 ml                         LV ESV:       44 ml                         LV SV:        72 ml RIGHT VENTRICLE RV Basal diam:  2.72 cm RV Mid diam:    3.02 cm LEFT ATRIUM             Index LA diam:        4.40 cm 2.13 cm/m LA Vol (A2C):   56.0 ml 27.10 ml/m LA Vol (A4C):   42.2 ml 20.42 ml/m LA Biplane Vol: 48.5 ml 23.47 ml/m  AORTIC VALVE LVOT Vmax:   115.00 cm/s LVOT Vmean:  83.600 cm/s LVOT VTI:    0.237 m  AORTA Ao Root diam: 3.10 cm Ao Asc diam:  3.50 cm MITRAL VALVE MV Area (PHT):             SHUNTS Decel Time:    169 msec    Systemic VTI:  0.24 m MV E velocity: 90.35 cm/s  Systemic Diam: 2.10 cm MV A velocity: 62.20 cm/s MV E/A ratio:  1.45 Skeet Latch MD Electronically signed by Skeet Latch MD Signature Date/Time: 04/23/2019/4:12:46 PM    Final     ASSESSMENT AND PLAN: This is a very pleasant 74 years old white male diagnosed  with metastatic non-small cell lung cancer that was initially diagnosed as unresectable stage IIb non-small cell lung cancer, adenocarcinoma presented with right upper lobe lung nodule in addition to right hilar lymphadenopathy diagnosed in February 2020.  The patient is now a good candidate for surgical resection. The patient underwent a course of concurrent chemoradiation with weekly carboplatin and paclitaxel status post 7 cycles completed in May 2020. The patient is currently on observation and he is feeling fine. He had repeat CT scan of the chest, abdomen pelvis performed recently.  I personally and independently reviewed the scan images and discussed the results with the patient today. Unfortunately his a scan showed further progression of the bilateral adrenal lesions suspicious for metastasis. This finding were confirmed by a PET scan. The patient underwent CT-guided core biopsy of the left adrenal gland lesion and the final pathology was consistent with metastatic adenocarcinoma of the lung. The  patient is currently undergoing systemic chemotherapy with carboplatin for AUC of 5, Alimta 500 mg/M2 and Keytruda 200 mg IV every 3 weeks.  He is status post 4 cycles.  He tolerated the last cycle of his treatment well with no concerning adverse effects except for fatigue. I recommended for him to proceed with cycle #5 today with maintenance Alimta and Keytruda. For the chemotherapy-induced anemia, will arrange for the patient to receive 2 units of PRBCs transfusion this week. For the tachycardia, I will arrange for the patient to receive 1 L of normal saline in the clinic today. He will come back for follow-up visit in 3 weeks for evaluation before the next cycle of his treatment. He was advised to call immediately if he has any concerning symptoms in the interval.  The patient voices understanding of current disease status and treatment options and is in agreement with the current care plan. All questions were answered. The patient knows to call the clinic with any problems, questions or concerns. We can certainly see the patient much sooner if necessary.  Disclaimer: This note was dictated with voice recognition software. Similar sounding words can inadvertently be transcribed and may not be corrected upon review.

## 2019-05-11 NOTE — Progress Notes (Signed)
05/11/19  Potassium level 3.2  Orders received to give potassium chloride 20 mEq IVBP over 2 hours.  T.O. Dr Mohamed/Jaleiah Asay Ronnald Ramp, PharmD

## 2019-05-11 NOTE — Addendum Note (Signed)
Addended by: Ardeen Garland on: 05/11/2019 12:44 PM   Modules accepted: Orders

## 2019-05-11 NOTE — Progress Notes (Signed)
Verbal order from Dr. Julien Nordmann: okat to treat patient with elevated HR. Give a liter of fluid.

## 2019-05-11 NOTE — Patient Instructions (Signed)
[facility] Discharge Instructions for Patients Receiving Chemotherapy  Today you received the following chemotherapy agents Pembrolizumab (KEYTRUDA) & Pemetrexed (ALIMTA).  To help prevent nausea and vomiting after your treatment, we encourage you to take your nausea medication as prescribed.  If you develop nausea and vomiting that is not controlled by your nausea medication, call the clinic.   BELOW ARE SYMPTOMS THAT SHOULD BE REPORTED IMMEDIATELY:  *FEVER GREATER THAN 100.5 F  *CHILLS WITH OR WITHOUT FEVER  NAUSEA AND VOMITING THAT IS NOT CONTROLLED WITH YOUR NAUSEA MEDICATION  *UNUSUAL SHORTNESS OF BREATH  *UNUSUAL BRUISING OR BLEEDING  TENDERNESS IN MOUTH AND THROAT WITH OR WITHOUT PRESENCE OF ULCERS  *URINARY PROBLEMS  *BOWEL PROBLEMS  UNUSUAL RASH Items with * indicate a potential emergency and should be followed up as soon as possible.  Feel free to call the clinic should you have any questions or concerns. The clinic phone number is [phone].  Please show the [place] at check-in to the Emergency Department and triage nurse.  Potassium chloride injection What is this medicine? POTASSIUM CHLORIDE (poe TASS i um KLOOR ide) is a potassium supplement used to prevent and to treat low potassium. Potassium is important for the heart, muscles, and nerves. Too much or too little potassium in the body can cause serious problems. This medicine may be used for other purposes; ask your health care provider or pharmacist if you have questions. COMMON BRAND NAME(S): PROAMP What should I tell my health care provider before I take this medicine? They need to know if you have any of these conditions:  Addison's disease  dehydration  diabetes  heart disease  high levels of potassium in the blood  irregular heartbeat  kidney disease  recent severe burn  an unusual or allergic reaction to potassium, other medicines, foods, dyes, or  preservatives  pregnant or trying to get pregnant  breast-feeding How should I use this medicine? This medicine is for infusion into a vein. It is given by a health care professional in a hospital or clinic setting. Talk to your pediatrician regarding the use of this medicine in children. Special care may be needed. Overdosage: If you think you have taken too much of this medicine contact a poison control center or emergency room at once. NOTE: This medicine is only for you. Do not share this medicine with others. What if I miss a dose? This does not apply. What may interact with this medicine? Do not take this medicine with any of the following medications:  certain diuretics such as spironolactone, triamterene  eplerenone  sodium polystyrene sulfonate This medicine may also interact with the following medications:  certain medicines for blood pressure or heart disease like lisinopril, losartan, quinapril, valsartan  medicines that lower your chance of fighting infection such as cyclosporine, tacrolimus  NSAIDs, medicines for pain and inflammation, like ibuprofen or naproxen  other potassium supplements  salt substitutes This list may not describe all possible interactions. Give your health care provider a list of all the medicines, herbs, non-prescription drugs, or dietary supplements you use. Also tell them if you smoke, drink alcohol, or use illegal drugs. Some items may interact with your medicine. What should I watch for while using this medicine? Your condition will be monitored carefully while you are receiving this medicine. You may need blood work done while you are taking this medicine. What side effects may I notice from receiving this medicine? Side effects that you should report to your  doctor or health care professional as soon as possible:  allergic reactions like skin rash, itching or hives, swelling of the face, lips, or tongue  breathing  problems  confusion  fast, irregular heartbeat  feeling faint or lightheaded, falls  low blood pressure  numbness or tingling in hands or feet  pain, redness, or irritation at site where injected  unusually weak or tired  weakness, heaviness of legs Side effects that usually do not require medical attention (report to your doctor or health care professional if they continue or are bothersome):  diarrhea  nausea, vomiting  stomach pain This list may not describe all possible side effects. Call your doctor for medical advice about side effects. You may report side effects to FDA at 1-800-FDA-1088. Where should I keep my medicine? This drug is given in a hospital or clinic and will not be stored at home. NOTE: This sheet is a summary. It may not cover all possible information. If you have questions about this medicine, talk to your doctor, pharmacist, or health care provider.  2020 Elsevier/Gold Standard (2015-10-26 13:59:20)

## 2019-05-12 ENCOUNTER — Ambulatory Visit: Payer: Medicare Other | Admitting: Podiatry

## 2019-05-12 DIAGNOSIS — M545 Low back pain: Secondary | ICD-10-CM | POA: Diagnosis not present

## 2019-05-12 DIAGNOSIS — M1612 Unilateral primary osteoarthritis, left hip: Secondary | ICD-10-CM | POA: Diagnosis not present

## 2019-05-13 ENCOUNTER — Other Ambulatory Visit: Payer: Self-pay

## 2019-05-13 ENCOUNTER — Encounter: Payer: Self-pay | Admitting: Physician Assistant

## 2019-05-13 ENCOUNTER — Ambulatory Visit (INDEPENDENT_AMBULATORY_CARE_PROVIDER_SITE_OTHER): Payer: Medicare Other | Admitting: Physician Assistant

## 2019-05-13 ENCOUNTER — Telehealth: Payer: Self-pay | Admitting: Internal Medicine

## 2019-05-13 VITALS — BP 122/70 | HR 108 | Ht 73.0 in | Wt 172.0 lb

## 2019-05-13 DIAGNOSIS — I10 Essential (primary) hypertension: Secondary | ICD-10-CM | POA: Diagnosis not present

## 2019-05-13 DIAGNOSIS — D6481 Anemia due to antineoplastic chemotherapy: Secondary | ICD-10-CM | POA: Diagnosis not present

## 2019-05-13 DIAGNOSIS — T451X5A Adverse effect of antineoplastic and immunosuppressive drugs, initial encounter: Secondary | ICD-10-CM | POA: Diagnosis not present

## 2019-05-13 DIAGNOSIS — I2510 Atherosclerotic heart disease of native coronary artery without angina pectoris: Secondary | ICD-10-CM

## 2019-05-13 DIAGNOSIS — M6281 Muscle weakness (generalized): Secondary | ICD-10-CM | POA: Diagnosis not present

## 2019-05-13 DIAGNOSIS — C3490 Malignant neoplasm of unspecified part of unspecified bronchus or lung: Secondary | ICD-10-CM

## 2019-05-13 DIAGNOSIS — R262 Difficulty in walking, not elsewhere classified: Secondary | ICD-10-CM | POA: Diagnosis not present

## 2019-05-13 DIAGNOSIS — M25672 Stiffness of left ankle, not elsewhere classified: Secondary | ICD-10-CM | POA: Diagnosis not present

## 2019-05-13 NOTE — Telephone Encounter (Signed)
Scheduled appt per 4/7 sch message- unable to reach pt - left message with appt date and time

## 2019-05-13 NOTE — Telephone Encounter (Signed)
Scheduled per los. Called and left msg. Mailed printout  °

## 2019-05-13 NOTE — Patient Instructions (Addendum)
Medication Instructions:  Your physician recommends that you continue on your current medications as directed. Please refer to the Current Medication list given to you today.  *If you need a refill on your cardiac medications before your next appointment, please call your pharmacy*   Lab Work: None ordered  If you have labs (blood work) drawn today and your tests are completely normal, you will receive your results only by: Marland Kitchen MyChart Message (if you have MyChart) OR . A paper copy in the mail If you have any lab test that is abnormal or we need to change your treatment, we will call you to review the results.   Testing/Procedures: None ordered   Follow-Up: At [facility], you and your health needs are our priority.  As part of our continuing mission to provide you with exceptional heart care, we have created designated Provider Care Teams.  These Care Teams include your primary Cardiologist (physician) and Advanced Practice Providers (APPs -  Physician Assistants and Nurse Practitioners) who all work together to provide you with the care you need, when you need it.  We recommend signing up for the patient portal called "MyChart".  Sign up information is provided on this After Visit Summary.  MyChart is used to connect with patients for Virtual Visits (Telemedicine).  Patients are able to view lab/test results, encounter notes, upcoming appointments, etc.  Non-urgent messages can be sent to your provider as well.   To learn more about what you can do with MyChart, go to [email or web address].    Your next appointment:   3 month(s)  The format for your next appointment:   In Person  Provider:   You may see Dr. Fransico Him or one of the following Advanced Practice Providers on your designated Care Team:    Melina Copa, PA-C  Ermalinda Barrios, PA-C    Other Instructions

## 2019-05-14 ENCOUNTER — Telehealth: Payer: Self-pay | Admitting: Medical Oncology

## 2019-05-14 ENCOUNTER — Other Ambulatory Visit: Payer: Self-pay | Admitting: Medical Oncology

## 2019-05-14 DIAGNOSIS — D649 Anemia, unspecified: Secondary | ICD-10-CM

## 2019-05-14 NOTE — Telephone Encounter (Signed)
Pt confirmed appt

## 2019-05-15 ENCOUNTER — Inpatient Hospital Stay: Payer: Medicare Other

## 2019-05-15 ENCOUNTER — Other Ambulatory Visit: Payer: Self-pay

## 2019-05-15 DIAGNOSIS — C7951 Secondary malignant neoplasm of bone: Secondary | ICD-10-CM | POA: Diagnosis not present

## 2019-05-15 DIAGNOSIS — C3411 Malignant neoplasm of upper lobe, right bronchus or lung: Secondary | ICD-10-CM | POA: Diagnosis not present

## 2019-05-15 DIAGNOSIS — C7971 Secondary malignant neoplasm of right adrenal gland: Secondary | ICD-10-CM | POA: Diagnosis not present

## 2019-05-15 DIAGNOSIS — D649 Anemia, unspecified: Secondary | ICD-10-CM

## 2019-05-15 DIAGNOSIS — C3492 Malignant neoplasm of unspecified part of left bronchus or lung: Secondary | ICD-10-CM

## 2019-05-15 DIAGNOSIS — C7972 Secondary malignant neoplasm of left adrenal gland: Secondary | ICD-10-CM | POA: Diagnosis not present

## 2019-05-15 DIAGNOSIS — R5382 Chronic fatigue, unspecified: Secondary | ICD-10-CM

## 2019-05-15 DIAGNOSIS — Z5111 Encounter for antineoplastic chemotherapy: Secondary | ICD-10-CM | POA: Diagnosis not present

## 2019-05-15 DIAGNOSIS — Z5112 Encounter for antineoplastic immunotherapy: Secondary | ICD-10-CM | POA: Diagnosis not present

## 2019-05-15 LAB — TSH: TSH: 1.375 u[IU]/mL (ref 0.320–4.118)

## 2019-05-15 LAB — PREPARE RBC (CROSSMATCH)

## 2019-05-15 LAB — ABO/RH: ABO/RH(D): O POS

## 2019-05-15 MED ORDER — ACETAMINOPHEN 325 MG PO TABS
ORAL_TABLET | ORAL | Status: AC
  Filled 2019-05-15: qty 2

## 2019-05-15 MED ORDER — HEPARIN SOD (PORK) LOCK FLUSH 100 UNIT/ML IV SOLN
500.0000 [IU] | Freq: Every day | INTRAVENOUS | Status: AC | PRN
  Administered 2019-05-15: 17:00:00 500 [IU]
  Filled 2019-05-15: qty 5

## 2019-05-15 MED ORDER — DIPHENHYDRAMINE HCL 25 MG PO CAPS
ORAL_CAPSULE | ORAL | Status: AC
  Filled 2019-05-15: qty 1

## 2019-05-15 MED ORDER — SODIUM CHLORIDE 0.9% IV SOLUTION
250.0000 mL | INTRAVENOUS | Status: DC
  Administered 2019-05-15: 12:00:00 250 mL via INTRAVENOUS
  Filled 2019-05-15: qty 250

## 2019-05-15 MED ORDER — DIPHENHYDRAMINE HCL 25 MG PO CAPS
25.0000 mg | ORAL_CAPSULE | Freq: Once | ORAL | Status: AC
  Administered 2019-05-15: 12:00:00 25 mg via ORAL

## 2019-05-15 MED ORDER — ACETAMINOPHEN 325 MG PO TABS
650.0000 mg | ORAL_TABLET | Freq: Once | ORAL | Status: AC
  Administered 2019-05-15: 650 mg via ORAL

## 2019-05-15 MED ORDER — SODIUM CHLORIDE 0.9% FLUSH
10.0000 mL | INTRAVENOUS | Status: AC | PRN
  Administered 2019-05-15: 10 mL
  Filled 2019-05-15: qty 10

## 2019-05-15 NOTE — Patient Instructions (Signed)

## 2019-05-16 LAB — TYPE AND SCREEN
ABO/RH(D): O POS
Antibody Screen: NEGATIVE
Unit division: 0
Unit division: 0

## 2019-05-16 LAB — BPAM RBC
Blood Product Expiration Date: 202105082359
Blood Product Expiration Date: 202105082359
ISSUE DATE / TIME: 202104091220
ISSUE DATE / TIME: 202104091220
Unit Type and Rh: 5100
Unit Type and Rh: 5100

## 2019-05-17 ENCOUNTER — Other Ambulatory Visit: Payer: Self-pay | Admitting: Family Medicine

## 2019-05-17 DIAGNOSIS — I739 Peripheral vascular disease, unspecified: Secondary | ICD-10-CM

## 2019-05-17 NOTE — Telephone Encounter (Signed)
Requested medications are due for refill today?  Yes  Requested medications are on active medication list?  Yes  Last Refill:  02/19/2019  # 30 with 2 refills   Future visit scheduled?  No    Notes to Clinic:  Medication failed RX refill protocol due to abnormal Hgb, HCT, Plt count on 05/11/2019.

## 2019-05-20 DIAGNOSIS — R262 Difficulty in walking, not elsewhere classified: Secondary | ICD-10-CM | POA: Diagnosis not present

## 2019-05-20 DIAGNOSIS — M25572 Pain in left ankle and joints of left foot: Secondary | ICD-10-CM | POA: Diagnosis not present

## 2019-05-20 DIAGNOSIS — M6281 Muscle weakness (generalized): Secondary | ICD-10-CM | POA: Diagnosis not present

## 2019-05-20 DIAGNOSIS — M25672 Stiffness of left ankle, not elsewhere classified: Secondary | ICD-10-CM | POA: Diagnosis not present

## 2019-05-22 DIAGNOSIS — S82832D Other fracture of upper and lower end of left fibula, subsequent encounter for closed fracture with routine healing: Secondary | ICD-10-CM | POA: Diagnosis not present

## 2019-05-22 DIAGNOSIS — R262 Difficulty in walking, not elsewhere classified: Secondary | ICD-10-CM | POA: Diagnosis not present

## 2019-05-22 DIAGNOSIS — M6281 Muscle weakness (generalized): Secondary | ICD-10-CM | POA: Diagnosis not present

## 2019-05-22 DIAGNOSIS — M25672 Stiffness of left ankle, not elsewhere classified: Secondary | ICD-10-CM | POA: Diagnosis not present

## 2019-05-26 NOTE — Progress Notes (Signed)
Pharmacist Chemotherapy Monitoring - Follow Up Assessment    I verify that I have reviewed each item in the below checklist:  . Regimen for the patient is scheduled for the appropriate day and plan matches scheduled date. Marland Kitchen Appropriate non-routine labs are ordered dependent on drug ordered. . If applicable, additional medications reviewed and ordered per protocol based on lifetime cumulative doses and/or treatment regimen.   Plan for follow-up and/or issues identified: No . I-vent associated with next due treatment: No  Bayan,Naysa Puskas D 05/26/2019 4:22 PM

## 2019-05-27 DIAGNOSIS — M25572 Pain in left ankle and joints of left foot: Secondary | ICD-10-CM | POA: Diagnosis not present

## 2019-05-27 DIAGNOSIS — M25672 Stiffness of left ankle, not elsewhere classified: Secondary | ICD-10-CM | POA: Diagnosis not present

## 2019-05-27 DIAGNOSIS — R262 Difficulty in walking, not elsewhere classified: Secondary | ICD-10-CM | POA: Diagnosis not present

## 2019-05-27 DIAGNOSIS — M6281 Muscle weakness (generalized): Secondary | ICD-10-CM | POA: Diagnosis not present

## 2019-05-29 ENCOUNTER — Other Ambulatory Visit: Payer: Self-pay | Admitting: *Deleted

## 2019-05-29 DIAGNOSIS — R262 Difficulty in walking, not elsewhere classified: Secondary | ICD-10-CM | POA: Diagnosis not present

## 2019-05-29 DIAGNOSIS — C3492 Malignant neoplasm of unspecified part of left bronchus or lung: Secondary | ICD-10-CM

## 2019-05-29 DIAGNOSIS — H04123 Dry eye syndrome of bilateral lacrimal glands: Secondary | ICD-10-CM | POA: Diagnosis not present

## 2019-05-29 DIAGNOSIS — M6281 Muscle weakness (generalized): Secondary | ICD-10-CM | POA: Diagnosis not present

## 2019-05-29 DIAGNOSIS — M25672 Stiffness of left ankle, not elsewhere classified: Secondary | ICD-10-CM | POA: Diagnosis not present

## 2019-05-29 DIAGNOSIS — S82832D Other fracture of upper and lower end of left fibula, subsequent encounter for closed fracture with routine healing: Secondary | ICD-10-CM | POA: Diagnosis not present

## 2019-06-01 ENCOUNTER — Other Ambulatory Visit: Payer: Self-pay | Admitting: Physician Assistant

## 2019-06-01 ENCOUNTER — Telehealth: Payer: Self-pay

## 2019-06-01 ENCOUNTER — Inpatient Hospital Stay: Payer: Medicare Other

## 2019-06-01 ENCOUNTER — Encounter: Payer: Self-pay | Admitting: Physician Assistant

## 2019-06-01 ENCOUNTER — Inpatient Hospital Stay (HOSPITAL_BASED_OUTPATIENT_CLINIC_OR_DEPARTMENT_OTHER): Payer: Medicare Other | Admitting: Physician Assistant

## 2019-06-01 ENCOUNTER — Other Ambulatory Visit: Payer: Self-pay

## 2019-06-01 VITALS — BP 104/55 | HR 109 | Temp 98.0°F | Resp 20 | Ht 73.0 in | Wt 165.7 lb

## 2019-06-01 DIAGNOSIS — D649 Anemia, unspecified: Secondary | ICD-10-CM

## 2019-06-01 DIAGNOSIS — Z5112 Encounter for antineoplastic immunotherapy: Secondary | ICD-10-CM | POA: Diagnosis not present

## 2019-06-01 DIAGNOSIS — C3492 Malignant neoplasm of unspecified part of left bronchus or lung: Secondary | ICD-10-CM

## 2019-06-01 DIAGNOSIS — E876 Hypokalemia: Secondary | ICD-10-CM

## 2019-06-01 DIAGNOSIS — Z95828 Presence of other vascular implants and grafts: Secondary | ICD-10-CM

## 2019-06-01 DIAGNOSIS — Z5111 Encounter for antineoplastic chemotherapy: Secondary | ICD-10-CM

## 2019-06-01 DIAGNOSIS — E861 Hypovolemia: Secondary | ICD-10-CM

## 2019-06-01 DIAGNOSIS — C3411 Malignant neoplasm of upper lobe, right bronchus or lung: Secondary | ICD-10-CM | POA: Diagnosis not present

## 2019-06-01 DIAGNOSIS — C7971 Secondary malignant neoplasm of right adrenal gland: Secondary | ICD-10-CM | POA: Diagnosis not present

## 2019-06-01 DIAGNOSIS — C7951 Secondary malignant neoplasm of bone: Secondary | ICD-10-CM | POA: Diagnosis not present

## 2019-06-01 DIAGNOSIS — K5900 Constipation, unspecified: Secondary | ICD-10-CM | POA: Diagnosis not present

## 2019-06-01 DIAGNOSIS — C7972 Secondary malignant neoplasm of left adrenal gland: Secondary | ICD-10-CM | POA: Diagnosis not present

## 2019-06-01 LAB — CBC WITH DIFFERENTIAL (CANCER CENTER ONLY)
Abs Immature Granulocytes: 0.08 10*3/uL — ABNORMAL HIGH (ref 0.00–0.07)
Basophils Absolute: 0 10*3/uL (ref 0.0–0.1)
Basophils Relative: 0 %
Eosinophils Absolute: 0.1 10*3/uL (ref 0.0–0.5)
Eosinophils Relative: 1 %
HCT: 24.7 % — ABNORMAL LOW (ref 39.0–52.0)
Hemoglobin: 8 g/dL — ABNORMAL LOW (ref 13.0–17.0)
Immature Granulocytes: 1 %
Lymphocytes Relative: 9 %
Lymphs Abs: 0.7 10*3/uL (ref 0.7–4.0)
MCH: 30.5 pg (ref 26.0–34.0)
MCHC: 32.4 g/dL (ref 30.0–36.0)
MCV: 94.3 fL (ref 80.0–100.0)
Monocytes Absolute: 1 10*3/uL (ref 0.1–1.0)
Monocytes Relative: 13 %
Neutro Abs: 5.7 10*3/uL (ref 1.7–7.7)
Neutrophils Relative %: 76 %
Platelet Count: 334 10*3/uL (ref 150–400)
RBC: 2.62 MIL/uL — ABNORMAL LOW (ref 4.22–5.81)
RDW: 19.4 % — ABNORMAL HIGH (ref 11.5–15.5)
WBC Count: 7.6 10*3/uL (ref 4.0–10.5)
nRBC: 0 % (ref 0.0–0.2)

## 2019-06-01 LAB — CMP (CANCER CENTER ONLY)
ALT: 17 U/L (ref 0–44)
AST: 26 U/L (ref 15–41)
Albumin: 3 g/dL — ABNORMAL LOW (ref 3.5–5.0)
Alkaline Phosphatase: 143 U/L — ABNORMAL HIGH (ref 38–126)
Anion gap: 17 — ABNORMAL HIGH (ref 5–15)
BUN: 13 mg/dL (ref 8–23)
CO2: 25 mmol/L (ref 22–32)
Calcium: 9.4 mg/dL (ref 8.9–10.3)
Chloride: 99 mmol/L (ref 98–111)
Creatinine: 0.86 mg/dL (ref 0.61–1.24)
GFR, Est AFR Am: >60 mL/min (ref >60)
GFR, Estimated: >60 mL/min (ref >60)
Glucose, Bld: 121 mg/dL — ABNORMAL HIGH (ref 70–99)
Potassium: 2.9 mmol/L — CL (ref 3.5–5.1)
Sodium: 141 mmol/L (ref 135–145)
Total Bilirubin: 1.2 mg/dL (ref 0.3–1.2)
Total Protein: 7.1 g/dL (ref 6.5–8.1)

## 2019-06-01 LAB — SAMPLE TO BLOOD BANK

## 2019-06-01 LAB — PREPARE RBC (CROSSMATCH)

## 2019-06-01 LAB — MAGNESIUM: Magnesium: 1.4 mg/dL — CL (ref 1.7–2.4)

## 2019-06-01 MED ORDER — DEXAMETHASONE SODIUM PHOSPHATE 10 MG/ML IJ SOLN
INTRAMUSCULAR | Status: AC
  Filled 2019-06-01: qty 1

## 2019-06-01 MED ORDER — SODIUM CHLORIDE 0.9% IV SOLUTION
250.0000 mL | Freq: Once | INTRAVENOUS | Status: DC
  Filled 2019-06-01: qty 250

## 2019-06-01 MED ORDER — SODIUM CHLORIDE 0.9% FLUSH
10.0000 mL | INTRAVENOUS | Status: DC | PRN
  Administered 2019-06-01: 10 mL
  Filled 2019-06-01: qty 10

## 2019-06-01 MED ORDER — DIPHENHYDRAMINE HCL 50 MG/ML IJ SOLN
25.0000 mg | Freq: Once | INTRAMUSCULAR | Status: AC
  Administered 2019-06-01: 25 mg via INTRAVENOUS

## 2019-06-01 MED ORDER — ACETAMINOPHEN 325 MG PO TABS
ORAL_TABLET | ORAL | Status: AC
  Filled 2019-06-01: qty 2

## 2019-06-01 MED ORDER — POTASSIUM CHLORIDE ER 20 MEQ PO TBCR: 20.0000 meq | EXTENDED_RELEASE_TABLET | Freq: Two times a day (BID) | ORAL | 0 refills | Status: DC

## 2019-06-01 MED ORDER — POTASSIUM CHLORIDE ER 20 MEQ PO TBCR: 20.0000 meq | EXTENDED_RELEASE_TABLET | Freq: Every day | ORAL | 0 refills | Status: AC

## 2019-06-01 MED ORDER — SODIUM CHLORIDE 0.9 % IV SOLN
5.0000 mg | Freq: Once | INTRAVENOUS | Status: AC
  Administered 2019-06-01: 5 mg via INTRAVENOUS
  Filled 2019-06-01: qty 0.5

## 2019-06-01 MED ORDER — CYANOCOBALAMIN 1000 MCG/ML IJ SOLN
INTRAMUSCULAR | Status: AC
  Filled 2019-06-01: qty 1

## 2019-06-01 MED ORDER — HEPARIN SOD (PORK) LOCK FLUSH 100 UNIT/ML IV SOLN
500.0000 [IU] | Freq: Once | INTRAVENOUS | Status: AC | PRN
  Administered 2019-06-01: 500 [IU]
  Filled 2019-06-01: qty 5

## 2019-06-01 MED ORDER — SODIUM CHLORIDE 0.9 % IV SOLN
Freq: Once | INTRAVENOUS | Status: AC
  Filled 2019-06-01: qty 1000

## 2019-06-01 MED ORDER — DIPHENHYDRAMINE HCL 25 MG PO CAPS: 25.0000 mg | ORAL_CAPSULE | Freq: Once | ORAL | Status: DC

## 2019-06-01 MED ORDER — PROCHLORPERAZINE MALEATE 10 MG PO TABS
10.0000 mg | ORAL_TABLET | Freq: Once | ORAL | Status: AC
  Administered 2019-06-01: 10 mg via ORAL

## 2019-06-01 MED ORDER — SODIUM CHLORIDE 0.9 % IV SOLN
500.0000 mg/m2 | Freq: Once | INTRAVENOUS | Status: AC
  Administered 2019-06-01: 1000 mg via INTRAVENOUS
  Filled 2019-06-01: qty 40

## 2019-06-01 MED ORDER — SODIUM CHLORIDE 0.9 % IV SOLN
200.0000 mg | Freq: Once | INTRAVENOUS | Status: AC
  Administered 2019-06-01: 200 mg via INTRAVENOUS
  Filled 2019-06-01: qty 8

## 2019-06-01 MED ORDER — PROCHLORPERAZINE MALEATE 10 MG PO TABS
ORAL_TABLET | ORAL | Status: AC
  Filled 2019-06-01: qty 1

## 2019-06-01 MED ORDER — SODIUM CHLORIDE 0.9% FLUSH
10.0000 mL | Freq: Once | INTRAVENOUS | Status: AC | PRN
  Administered 2019-06-01: 10 mL
  Filled 2019-06-01: qty 10

## 2019-06-01 MED ORDER — DIPHENHYDRAMINE HCL 50 MG/ML IJ SOLN
INTRAMUSCULAR | Status: AC
  Filled 2019-06-01: qty 1

## 2019-06-01 MED ORDER — SODIUM CHLORIDE 0.9 % IV SOLN
Freq: Once | INTRAVENOUS | Status: AC
  Filled 2019-06-01: qty 250

## 2019-06-01 MED ORDER — ACETAMINOPHEN 325 MG PO TABS
650.0000 mg | ORAL_TABLET | Freq: Once | ORAL | Status: AC
  Administered 2019-06-01: 650 mg via ORAL

## 2019-06-01 MED ORDER — CYANOCOBALAMIN 1000 MCG/ML IJ SOLN
1000.0000 ug | Freq: Once | INTRAMUSCULAR | Status: AC
  Administered 2019-06-01: 1000 ug via INTRAMUSCULAR

## 2019-06-01 NOTE — Telephone Encounter (Signed)
CRITICAL VALUE STICKER  CRITICAL VALUE: magnesium 1.4  RECEIVER (on-site recipient of call):Joelys Staubs [place] NOTIFIED: 06/01/19 1226  MESSENGER (representative from lab): [facility]  MD NOTIFIED: Cassie Heilingoetter PA-C  TIME OF NOTIFICATION: 1230  RESPONSE: Pt. To receive IV mg today in infusion

## 2019-06-01 NOTE — Patient Instructions (Signed)
It is common for patients who are undergoing treatment and taking certain prescribed medications to experience side-effects with constipation.  If you experience constipation, please take stool softener such as Colace or Senna one tablet twice a day everyday to avoid constipation.  These medications are available over the counter.  Of course, if you have diarrhea, stop taking stool softeners.  Drinking plenty of fluid, eating fruits and vegetable, and being active also reduces the risk of constipation.   If despite taking stool softeners, and you still have no bowel movement for 2 days or more than your normal bowel habit frequency, please take one of the following over the counter laxatives:  MiraLax, Milk of Magnesia or Mag Citrate everyday and contact me immediately for further instructions.  The goal is to have at least one bowel movement every other day.   

## 2019-06-01 NOTE — Patient Instructions (Signed)
[facility] Discharge Instructions for Patients Receiving Chemotherapy  Today you received the following chemotherapy agents Keytruda and Alimta  To help prevent nausea and vomiting after your treatment, we encourage you to take your nausea medication as directed   If you develop nausea and vomiting that is not controlled by your nausea medication, call the clinic.   BELOW ARE SYMPTOMS THAT SHOULD BE REPORTED IMMEDIATELY:  *FEVER GREATER THAN 100.5 F  *CHILLS WITH OR WITHOUT FEVER  NAUSEA AND VOMITING THAT IS NOT CONTROLLED WITH YOUR NAUSEA MEDICATION  *UNUSUAL SHORTNESS OF BREATH  *UNUSUAL BRUISING OR BLEEDING  TENDERNESS IN MOUTH AND THROAT WITH OR WITHOUT PRESENCE OF ULCERS  *URINARY PROBLEMS  *BOWEL PROBLEMS  UNUSUAL RASH Items with * indicate a potential emergency and should be followed up as soon as possible.  Feel free to call the clinic should you have any questions or concerns. The clinic phone number is [phone].  Please show the [place] at check-in to the Emergency Department and triage nurse..    https://www.redcrossblood.org/donate-blood/blood-donation-process/what-happens-to-donated-blood/blood-transfusions/types-of-[email or web address].html"> https://www.hematology.org/education/patients/blood-basics/blood-safety-and-matching"> https://www.nhlbi.nih.gov/health-topics/blood-transfusion">  Blood Transfusion, Adult A blood transfusion is a procedure in which you receive blood or a type of blood cell (blood component) through an IV. You may need a blood transfusion when your blood level is low. This may result from a bleeding disorder, illness, injury, or surgery. The blood may come from a donor. You may also be able to donate blood for yourself (autologous blood donation) before a planned surgery. The blood given in a transfusion is made up of different blood components. You may receive:  Red blood cells. These carry oxygen to the cells  in the body.  Platelets. These help your blood to clot.  Plasma. This is the liquid part of your blood. It carries proteins and other substances throughout the body.  White blood cells. These help you fight infections. If you have hemophilia or another clotting disorder, you may also receive other types of blood products. Tell a health care provider about:  Any blood disorders you have.  Any previous reactions you have had during a blood transfusion.  Any allergies you have.  All medicines you are taking, including vitamins, herbs, eye drops, creams, and over-the-counter medicines.  Any surgeries you have had.  Any medical conditions you have, including any recent fever or cold symptoms.  Whether you are pregnant or may be pregnant. What are the risks? Generally, this is a safe procedure. However, problems may occur.  The most common problems include: ? A mild allergic reaction, such as red, swollen areas of skin (hives) and itching. ? Fever or chills. This may be the body's response to new blood cells received. This may occur during or up to 4 hours after the transfusion.  More serious problems may include: ? Transfusion-associated circulatory overload (TACO), or too much fluid in the lungs. This may cause breathing problems. ? A serious allergic reaction, such as difficulty breathing or swelling around the face and lips. ? Transfusion-related acute lung injury (TRALI), which causes breathing difficulty and low oxygen in the blood. This can occur within hours of the transfusion or several days later. ? Iron overload. This can happen after receiving many blood transfusions over a period of time. ? Infection or virus being transmitted. This is rare because donated blood is carefully tested before it is given. ? Hemolytic transfusion reaction. This is rare. It happens when your body's defense system (immune system)tries to attack the new blood cells. Symptoms may include  fever,  chills, nausea, low blood pressure, and low back or chest pain. ? Transfusion-associated graft-versus-host disease (TAGVHD). This is rare. It happens when donated cells attack your body's healthy tissues. What happens before the procedure? Medicines Ask your health care provider about:  Changing or stopping your regular medicines. This is especially important if you are taking diabetes medicines or blood thinners.  Taking medicines such as aspirin and ibuprofen. These medicines can thin your blood. Do not take these medicines unless your health care provider tells you to take them.  Taking over-the-counter medicines, vitamins, herbs, and supplements. General instructions  Follow instructions from your health care provider about eating and drinking restrictions.  You will have a blood test to determine your blood type. This is necessary to know what kind of blood your body will accept and to match it to the donor blood.  If you are going to have a planned surgery, you may be able to do an autologous blood donation. This may be done in case you need to have a transfusion.  You will have your temperature, blood pressure, and pulse monitored before the transfusion.  If you have had an allergic reaction to a transfusion in the past, you may be given medicine to help prevent a reaction. This medicine may be given to you by mouth (orally) or through an IV.  Set aside time for the blood transfusion. This procedure generally takes 1-4 hours to complete. What happens during the procedure?   An IV will be inserted into one of your veins.  The bag of donated blood will be attached to your IV. The blood will then enter through your vein.  Your temperature, blood pressure, and pulse will be monitored regularly during the transfusion. This monitoring is done to detect early signs of a transfusion reaction.  Tell your nurse right away if you have any of these symptoms during the  transfusion: ? Shortness of breath or trouble breathing. ? Chest or back pain. ? Fever or chills. ? Hives or itching.  If you have any signs or symptoms of a reaction, your transfusion will be stopped and you may be given medicine.  When the transfusion is complete, your IV will be removed.  Pressure may be applied to the IV site for a few minutes.  A bandage (dressing)will be applied. The procedure may vary among health care providers and hospitals. What happens after the procedure?  Your temperature, blood pressure, pulse, breathing rate, and blood oxygen level will be monitored until you leave the hospital or clinic.  Your blood may be tested to see how you are responding to the transfusion.  You may be warmed with fluids or blankets to maintain a normal body temperature.  If you receive your blood transfusion in an outpatient setting, you will be told whom to contact to report any reactions. Where to find more information For more information on blood transfusions, visit the American Red Cross: redcross.org Summary  A blood transfusion is a procedure in which you receive blood or a type of blood cell (blood component) through an IV.  The blood you receive may come from a donor or be donated by yourself (autologous blood donation) before a planned surgery.  The blood given in a transfusion is made up of different blood components. You may receive red blood cells, platelets, plasma, or white blood cells depending on the condition treated.  Your temperature, blood pressure, and pulse will be monitored before, during, and after the transfusion.  After the transfusion, your blood may be tested to see how your body has responded. This information is not intended to replace advice given to you by your health care provider. Make sure you discuss any questions you have with your health care provider. Document Revised: 07/17/2018 Document Reviewed: 07/17/2018 Elsevier Patient  Education  [place].

## 2019-06-01 NOTE — Telephone Encounter (Addendum)
CRITICAL VALUE STICKER  CRITICAL VALUE: Potassium 2.9  RECEIVER (on-site recipient of call): Lenox Ponds LPN  DATE & TIME NOTIFIED: 06/01/19 1146  MESSENGER (representative from lab): [facility]   MD NOTIFIED: Cassie Heilingoetter PA-C  TIME OF NOTIFICATION: 2550  RESPONSE: Oral potassium prescription sent to pharmacy by provider

## 2019-06-01 NOTE — Progress Notes (Signed)
[facility] OFFICE PROGRESS NOTE  Seth Agreste, MD [address] [place] [address]  DIAGNOSIS: Metastatic non-small cell lung cancer, adenocarcinoma initially diagnosed as stage IIB (T1c, N1, M0) non-small cell lung cancer favoring adenocarcinoma presented with right upper lobe lung nodule in addition to right hilar lymphadenopathy diagnosed in February 2020. The patient is not a good surgical candidate for resection.He had evidence for disease recurrence in December 2020 with adrenal gland metastasis.  Molecular Biomarkers: Negative for any actionable mutations  PRIOR THERAPY: Concurrent chemoradiation with weekly carboplatin for AUC of 2 and paclitaxel 45 mg/M2. First dose May 05, 2018. Status post 7 cycles. Last dose was given Jun 16, 2018  CURRENT THERAPY: Systemic chemotherapy with carboplatin for AUC of 5, Alimta 500 mg/M2 and Keytruda 200 mg IV every 3 weeks. First dose starting February 18, 2019. Status post 5 cycles.   INTERVAL HISTORY: Seth [name] 74 y.o. male returns to clinic today for a follow-up visit.  The patient states that he is feeling fairly well today but has not had a bowel movement in a week.  He denies any abdominal pain at this time.  He has MiraLAX and stool softeners at home but has not taken anything at this time. He also reports some fatigue.  In the past when he experienced fatigue and was anemic, the patient received a blood transfusion with significant improvement in his symptoms.  Otherwise, the patient has been tolerating his treatment fairly well.  He reports occasional nausea and occasional "vomiting".  He describes that it is not the "flu kind of throwing up" but that is more so related to sinus drainage.  He reports dyspnea on exertion but denies any chest pain, significant cough, or hemoptysis.  He denies any headache or visual changes.  He denies any rashes or skin changes.  He is here today for evaluation before starting cycle  # 6.  MEDICAL HISTORY: Past Medical History:  Diagnosis Date  . AAA (abdominal aortic aneurysm) ([place])   . Allergic rhinitis   . Anxiety   . Back pain   . CAD (coronary artery disease)   . Cataracts, bilateral   . Emphysema, unspecified ([facility])     " moderate"  . Essential hypertension, benign   . GERD (gastroesophageal reflux disease)   . Gout   . Headache    migraines  . Hilar adenopathy   . History of hiatal hernia   . History of sciatica   . Hyperlipidemia   . lung ca dx'd 02/2018  . Lung nodule   . Pneumonia    as child  . Prostate hypertrophy   . PVD (peripheral vascular disease) ([facility])   . Seasonal allergies   . Subclavian steal syndrome of left subclavian artery   . Wears dentures     ALLERGIES:  is allergic to biaxin [clarithromycin]; clarithromycin; iodinated diagnostic agents; prednisone; cortisone; flomax [tamsulosin hcl]; hepatitis b virus vaccines; and iodine.  MEDICATIONS:  Current Outpatient Medications  Medication Sig Dispense Refill  . atorvastatin (LIPITOR) 40 MG tablet TAKE 1 TABLET BY MOUTH EVERY DAY 90 tablet 3  . cetirizine (ZYRTEC) 10 MG tablet Take 10 mg by mouth daily at 12 noon.    . clopidogrel (PLAVIX) 75 MG tablet TAKE 1 TABLET BY MOUTH EVERY DAY 90 tablet 0  . famotidine (PEPCID) 20 MG tablet Take 20 mg by mouth 2 (two) times daily.     . folic acid (FOLVITE) 1 MG tablet TAKE 1 TABLET BY  MOUTH EVERY DAY 90 tablet 1  . hydroxypropyl methylcellulose / hypromellose (ISOPTO TEARS / GONIOVISC) 2.5 % ophthalmic solution Place 1 drop into both eyes daily.     . magic mouthwash SOLN Take 5 mLs by mouth 4 (four) times daily as needed for mouth pain. 240 mL 0  . magnesium oxide (MAG-OX) 400 (241.3 Mg) MG tablet Take 1 tablet (400 mg total) by mouth 2 (two) times daily. 60 tablet 0  . metoprolol tartrate (LOPRESSOR) 25 MG tablet Take 1 tablet (25 mg total) by mouth 2 (two) times daily. Only take if systolic blood pressure is >110 180 tablet 3  .  polyethylene glycol (MIRALAX) 17 g packet Take 17 g by mouth daily as needed for moderate constipation. 14 each 0  . prochlorperazine (COMPAZINE) 10 MG tablet Take 1 tablet (10 mg total) by mouth every 6 (six) hours as needed for nausea or vomiting. 30 tablet 0  . sodium chloride (OCEAN) 0.65 % SOLN nasal spray Place 1 spray into both nostrils 2 (two) times daily as needed for congestion.    . Potassium Chloride ER 20 MEQ TBCR Take 20 mEq by mouth daily. 7 tablet 0   No current facility-administered medications for this visit.   Facility-Administered Medications Ordered in Other Visits  Medication Dose Route Frequency Provider Last Rate Last Admin  . 0.9 %  sodium chloride infusion (Manually program via Guardrails IV Fluids)  250 mL Intravenous Once Jania Steinke L, PA-C      . diphenhydrAMINE (BENADRYL) capsule 25 mg  25 mg Oral Once Nnenna Meador L, PA-C      . heparin lock flush 100 unit/mL  500 Units Intravenous Once Curt Bears, MD      . heparin lock flush 100 unit/mL  500 Units Intracatheter Once PRN Curt Bears, MD      . pembrolizumab [facility]) 200 mg in sodium chloride 0.9 % 50 mL chemo infusion  200 mg Intravenous Once Curt Bears, MD      . PEMEtrexed (ALIMTA) 1,000 mg in sodium chloride 0.9 % 100 mL chemo infusion  500 mg/m2 (Treatment Plan Ideal) Intravenous Once Curt Bears, MD      . sodium chloride 0.9 % 1,000 mL with potassium chloride 20 mEq, magnesium sulfate 2 g infusion   Intravenous Once Krystalyn Kubota L, PA-C      . sodium chloride flush (NS) 0.9 % injection 10 mL  10 mL Intravenous PRN Curt Bears, MD   10 mL at 04/21/19 1306  . sodium chloride flush (NS) 0.9 % injection 10 mL  10 mL Intracatheter PRN Curt Bears, MD      . sodium chloride flush (NS) 0.9 % injection 3 mL  3 mL Intracatheter PRN Curt Bears, MD        SURGICAL HISTORY:  Past Surgical History:  Procedure Laterality Date  . CARDIAC  CATHETERIZATION     stent placement  . COLONOSCOPY W/ BIOPSIES AND POLYPECTOMY    . ILIAC ARTERY STENT    . IR IMAGING GUIDED PORT INSERTION  03/23/2019  . MULTIPLE TOOTH EXTRACTIONS    . VIDEO BRONCHOSCOPY WITH ENDOBRONCHIAL NAVIGATION N/A 02/26/2018   Procedure: VIDEO BRONCHOSCOPY WITH ENDOBRONCHIAL NAVIGATION;  Surgeon: Garner Nash, DO;  Location: [facility];  Service: Thoracic;  Laterality: N/A;  . VIDEO BRONCHOSCOPY WITH ENDOBRONCHIAL NAVIGATION N/A 03/19/2018   Procedure: VIDEO BRONCHOSCOPY WITH ENDOBRONCHIAL NAVIGATION and endobronchial ultrasound;  Surgeon: Garner Nash, DO;  Location: [facility];  Service: Thoracic;  Laterality: N/A;  .  VIDEO BRONCHOSCOPY WITH ENDOBRONCHIAL ULTRASOUND N/A 02/26/2018   Procedure: VIDEO BRONCHOSCOPY WITH ENDOBRONCHIAL ULTRASOUND;  Surgeon: Garner Nash, DO;  Location: [facility];  Service: Thoracic;  Laterality: N/A;  . VIDEO BRONCHOSCOPY WITH ENDOBRONCHIAL ULTRASOUND N/A 03/19/2018   Procedure: NAVIGATION BRONCHOSCOPY;  Surgeon: Garner Nash, DO;  Location: [facility];  Service: Thoracic;  Laterality: N/A;    REVIEW OF SYSTEMS:   Review of Systems  Constitutional: Positive for fatigue.  Negative for appetite change, chills, fever and unexpected weight change.  HENT: Negative for mouth sores, nosebleeds, sore throat and trouble swallowing.   Eyes: Negative for eye problems and icterus.  Respiratory: Positive for baseline shortness of breath with exertion.  Negative for cough, hemoptysis, and wheezing.   Cardiovascular: Negative for chest pain and leg swelling.  Gastrointestinal: Positive for constipation and occasional nausea/"vomiting".  Negative for abdominal pain and diarrhea Genitourinary: Negative for bladder incontinence, difficulty urinating, dysuria, frequency and hematuria.   Musculoskeletal: Negative for back pain, gait problem, neck pain and neck stiffness.  Skin: Negative for itching and rash.  Neurological: Negative for dizziness, extremity  weakness, gait problem, headaches, light-headedness and seizures.  Hematological: Negative for adenopathy. Does not bruise/bleed easily.  Psychiatric/Behavioral: Negative for confusion, depression and sleep disturbance. The patient is not nervous/anxious.     PHYSICAL EXAMINATION:  Blood pressure (!) 104/55, pulse (!) 109, temperature 98 F (36.7 C), temperature source Temporal, resp. rate 20, height 6\' 1"  (1.854 m), weight 165 lb 11.2 oz (75.2 kg), SpO2 100 %.  ECOG PERFORMANCE STATUS: 1 - Symptomatic but completely ambulatory  Physical Exam  Constitutional: Oriented to person, place, and time and well-developed, well-nourished, and in no distress.  HENT:  Head: Normocephalic and atraumatic.  Mouth/Throat: Oropharynx is clear and moist. No oropharyngeal exudate.  Eyes: Conjunctivae are normal. Right eye exhibits no discharge. Left eye exhibits no discharge. No scleral icterus.  Neck: Normal range of motion. Neck supple.  Cardiovascular: Normal rate, regular rhythm, normal heart sounds and intact distal pulses.   Pulmonary/Chest: Effort normal and breath sounds normal. No respiratory distress. No wheezes. No rales.  Abdominal: Soft. Bowel sounds are normal. Exhibits no distension and no mass. There is no tenderness.  Musculoskeletal: Normal range of motion. Exhibits no edema.  Lymphadenopathy:    No cervical adenopathy.  Neurological: Alert and oriented to person, place, and time. Exhibits normal muscle tone. Gait normal. Coordination normal.  Skin: Skin is warm and dry. No rash noted. Not diaphoretic. No erythema. No pallor.  Psychiatric: Mood, memory and judgment normal.  Vitals reviewed.  LABORATORY DATA: Lab Results  Component Value Date   WBC 7.6 06/01/2019   HGB 8.0 (L) 06/01/2019   HCT 24.7 (L) 06/01/2019   MCV 94.3 06/01/2019   PLT 334 06/01/2019      Chemistry      Component Value Date/Time   NA 141 06/01/2019 1047   NA 133 (L) 01/19/2019 1457   K 2.9 (LL)  06/01/2019 1047   CL 99 06/01/2019 1047   CO2 25 06/01/2019 1047   BUN 13 06/01/2019 1047   BUN 9 01/19/2019 1457   CREATININE 0.86 06/01/2019 1047   CREATININE 1.09 03/23/2015 0928      Component Value Date/Time   CALCIUM 9.4 06/01/2019 1047   ALKPHOS 143 (H) 06/01/2019 1047   AST 26 06/01/2019 1047   ALT 17 06/01/2019 1047   BILITOT 1.2 06/01/2019 1047       RADIOGRAPHIC STUDIES:  No results found.   ASSESSMENT/PLAN:  This is a very pleasant 74 year old Caucasian male diagnosed with unresectable stage IIb non-small cell lung cancer, adenocarcinoma.  He presented with a right upper lobe lung nodule in addition to right hilar lymphadenopathy.  He was diagnosed in February 2020.  He is status post 7 cycles of concurrent chemoradiation with carboplatin for an AUC of 2 and paclitaxel 45 mg/m.  He had been on observation until December 2020 when routine his CT scan of the chest, abdomen, and pelvis showed evidence of bilateral adrenal metastases which were biopsy-proven to be metastatic adenocarcinoma of the lung.  The patient is currently undergoing systemic chemotherapy with carboplatin for an AUC of 5, Alimta 500 mg/m2, and Keytruda 200 mg IV every 3 weeks.  He is status post 5 cycles of treatment.   Labs were reviewed.  The patient's hemoglobin is 8 today.  The patient is symptomatic and he expressed significant improvement in his fatigue after his last blood transfusion.  I will arrange for the patient to have a blood transfusion with 1 unit of packed RBCs tomorrow.  Additionally, the patient's potassium is low at 2.9 and his magnesium is low at 1.4 today.  I will arrange for the patient to have a 20 mEq of potassium IV today as well as 2 g of magnesium sulfate.  I also sent a prescription of potassium 20 mEq p.o. once daily to his pharmacy.   Recommend that he proceed with cycle #6 today as scheduled.  The patient's last restaging CT scan (reviewed at his appointment on  04/21/19) showed an increase in size of some of the pulmonary nodules as well as the adrenal gland.  At that time he was given the option of a referral to hospice/palliative care versus second line treatment with docetaxel and Cyramza.  However, the adverse side effects are significant with the patient's general condition and comorbidities.  The patient decided to continue on his current treatment with Keytruda and Alimta with close monitoring of his disease on upcoming imaging studies.  The patient is now due for restaging CT scan.  I will arrange the patient to have a restaging CT scan of the chest, abdomen, and pelvis prior to his next appointment with cycle #7.  We will see the patient back for follow-up visit in 3 weeks for evaluation and to review his scan results before starting cycle #7.  The patient is went several days without a bowel movement.  I provided the patient with constipation education on his AVS today.  The AVS information regarding lifestyle modifications, stool softeners, and laxative options.  Discussed that we would like the patient to ideally have a bowel movement every day or every other day.   The patient was advised to call immediately if he has any concerning symptoms in the interval. The patient voices understanding of current disease status and treatment options and is in agreement with the current care plan. All questions were answered. The patient knows to call the clinic with any problems, questions or concerns. We can certainly see the patient much sooner if necessary   Orders Placed This Encounter  Procedures  . CT Abdomen Pelvis Wo Contrast    Standing Status:   Future    Standing Expiration Date:   05/31/2020    Order Specific Question:   ** REASON FOR EXAM (FREE TEXT)    Answer:   restaging CT scan for lung cancer    Order Specific Question:   Preferred imaging location?    Answer:  [facility]    Order Specific Question:   Is Oral Contrast  requested for this exam?    Answer:   Yes, Per Radiology protocol    Order Specific Question:   Radiology Contrast Protocol - do NOT remove file path    Answer:   \\[email or web address].pdf  . CT Chest Wo Contrast    Standing Status:   Future    Standing Expiration Date:   05/31/2020    Order Specific Question:   ** REASON FOR EXAM (FREE TEXT)    Answer:   Restaging Lung cancer    Order Specific Question:   Preferred imaging location?    Answer:   [facility]    Order Specific Question:   Radiology Contrast Protocol - do NOT remove file path    Answer:   \\[email or web address]\CTProtocols.pdf  . CBC with Differential ([facility] Only)    Standing Status:   Standing    Number of Occurrences:   18    Standing Expiration Date:   05/31/2020  . CMP ([facility] only)    Standing Status:   Standing    Number of Occurrences:   18    Standing Expiration Date:   05/31/2020  . Care order/instruction    Transfuse Parameters    Standing Status:   Future    Standing Expiration Date:   05/31/2020  . Informed Consent Details: Physician/Practitioner Attestation; Transcribe to consent form and obtain patient signature    Standing Status:   Future    Standing Expiration Date:   05/31/2020    Order Specific Question:   Physician/Practitioner attestation of informed consent for blood and or blood product transfusion    Answer:   I, the physician/practitioner, attest that I have discussed with the patient the benefits, risks, side effects, alternatives, likelihood of achieving goals and potential problems during recovery for the procedure that I have provided informed consent.    Order Specific Question:   Product(s)    Answer:   All Product(s)  . Type and screen    Standing Status:   Future    Standing Expiration Date:   05/31/2020     Kayn Haymore L Armetta Henri, PA-C 06/01/19

## 2019-06-02 ENCOUNTER — Other Ambulatory Visit: Payer: Self-pay | Admitting: Physician Assistant

## 2019-06-02 ENCOUNTER — Inpatient Hospital Stay: Payer: Medicare Other

## 2019-06-02 ENCOUNTER — Other Ambulatory Visit: Payer: Self-pay | Admitting: Medical

## 2019-06-02 ENCOUNTER — Inpatient Hospital Stay (HOSPITAL_BASED_OUTPATIENT_CLINIC_OR_DEPARTMENT_OTHER): Payer: Medicare Other | Admitting: Medical

## 2019-06-02 DIAGNOSIS — Z5112 Encounter for antineoplastic immunotherapy: Secondary | ICD-10-CM | POA: Diagnosis not present

## 2019-06-02 DIAGNOSIS — M25559 Pain in unspecified hip: Secondary | ICD-10-CM

## 2019-06-02 DIAGNOSIS — C3492 Malignant neoplasm of unspecified part of left bronchus or lung: Secondary | ICD-10-CM

## 2019-06-02 DIAGNOSIS — D649 Anemia, unspecified: Secondary | ICD-10-CM

## 2019-06-02 DIAGNOSIS — C7971 Secondary malignant neoplasm of right adrenal gland: Secondary | ICD-10-CM | POA: Diagnosis not present

## 2019-06-02 DIAGNOSIS — G893 Neoplasm related pain (acute) (chronic): Secondary | ICD-10-CM

## 2019-06-02 DIAGNOSIS — Z5111 Encounter for antineoplastic chemotherapy: Secondary | ICD-10-CM | POA: Diagnosis not present

## 2019-06-02 DIAGNOSIS — C7951 Secondary malignant neoplasm of bone: Secondary | ICD-10-CM | POA: Diagnosis not present

## 2019-06-02 DIAGNOSIS — C3411 Malignant neoplasm of upper lobe, right bronchus or lung: Secondary | ICD-10-CM | POA: Diagnosis not present

## 2019-06-02 DIAGNOSIS — C7972 Secondary malignant neoplasm of left adrenal gland: Secondary | ICD-10-CM | POA: Diagnosis not present

## 2019-06-02 MED ORDER — MORPHINE SULFATE 4 MG/ML IJ SOLN
1.0000 mg | Freq: Once | INTRAMUSCULAR | Status: AC
  Administered 2019-06-02: 1 mg via INTRAVENOUS
  Filled 2019-06-02: qty 1

## 2019-06-02 MED ORDER — DIPHENHYDRAMINE HCL 25 MG PO CAPS
25.0000 mg | ORAL_CAPSULE | Freq: Once | ORAL | Status: AC
  Administered 2019-06-02: 25 mg via ORAL

## 2019-06-02 MED ORDER — MORPHINE SULFATE (PF) 4 MG/ML IV SOLN
INTRAVENOUS | Status: AC
  Filled 2019-06-02: qty 1

## 2019-06-02 MED ORDER — HEPARIN SOD (PORK) LOCK FLUSH 100 UNIT/ML IV SOLN
500.0000 [IU] | Freq: Every day | INTRAVENOUS | Status: AC | PRN
  Administered 2019-06-02: 500 [IU]
  Filled 2019-06-02: qty 5

## 2019-06-02 MED ORDER — ACETAMINOPHEN 325 MG PO TABS
650.0000 mg | ORAL_TABLET | Freq: Once | ORAL | Status: AC
  Administered 2019-06-02: 650 mg via ORAL

## 2019-06-02 MED ORDER — SODIUM CHLORIDE 0.9% IV SOLUTION
250.0000 mL | Freq: Once | INTRAVENOUS | Status: AC
  Administered 2019-06-02: 250 mL via INTRAVENOUS
  Filled 2019-06-02: qty 250

## 2019-06-02 MED ORDER — SODIUM CHLORIDE 0.9% FLUSH
10.0000 mL | INTRAVENOUS | Status: AC | PRN
  Administered 2019-06-02: 10 mL
  Filled 2019-06-02: qty 10

## 2019-06-02 MED ORDER — OXYCODONE-ACETAMINOPHEN 5-325 MG PO TABS
ORAL_TABLET | ORAL | Status: AC
  Filled 2019-06-02: qty 1

## 2019-06-02 MED ORDER — ACETAMINOPHEN 325 MG PO TABS
ORAL_TABLET | ORAL | Status: AC
  Filled 2019-06-02: qty 2

## 2019-06-02 MED ORDER — OXYCODONE-ACETAMINOPHEN 5-325 MG PO TABS
1.0000 | ORAL_TABLET | Freq: Once | ORAL | Status: AC
  Administered 2019-06-02: 1 via ORAL

## 2019-06-02 MED ORDER — DIPHENHYDRAMINE HCL 25 MG PO CAPS
ORAL_CAPSULE | ORAL | Status: AC
  Filled 2019-06-02: qty 1

## 2019-06-02 NOTE — Patient Instructions (Signed)
https://www.[facility].org/donate-blood/blood-donation-process/what-happens-to-donated-blood/blood-transfusions/types-of-[email or web address].html"> https://www.[facility].org/donate-blood/blood-donation-process/what-happens-to-donated-blood/blood-transfusions/risks-complications.html">  Blood Transfusion, Adult, Care After This sheet gives you information about how to care for yourself after your procedure. Your health care provider may also give you more specific instructions. If you have problems or questions, contact your health care provider. What can I expect after the procedure? After the procedure, it is common to have:  Bruising and soreness where the IV was inserted.  A fever or chills on the day of the procedure. This may be your body's response to the new blood cells received.  A headache. Follow these instructions at home: IV insertion site care      Follow instructions from your health care provider about how to take care of your IV insertion site. Make sure you: ? Wash your hands with soap and water before and after you change your bandage (dressing). If soap and water are not available, use hand sanitizer. ? Change your dressing as told by your health care provider.  Check your IV insertion site every day for signs of infection. Check for: ? Redness, swelling, or pain. ? Bleeding from the site. ? Warmth. ? Pus or a bad smell. General instructions  Take over-the-counter and prescription medicines only as told by your health care provider.  Rest as told by your health care provider.  Return to your normal activities as told by your health care provider.  Keep all follow-up visits as told by your health care provider. This is important. Contact a health care provider if:  You have itching or red, swollen areas of skin (hives).  You feel anxious.  You feel weak after doing your normal activities.  You have redness, swelling, warmth, or pain around the IV  insertion site.  You have blood coming from the IV insertion site that does not stop with pressure.  You have pus or a bad smell coming from your IV insertion site. Get help right away if:  You have symptoms of a serious allergic or immune system reaction, including: ? Trouble breathing or shortness of breath. ? Swelling of the face or feeling flushed. ? Fever or chills. ? Pain in the head, back, or chest. ? Dark urine or blood in the urine. ? Widespread rash. ? Fast heartbeat. ? Feeling dizzy or light-headed. If you receive your blood transfusion in an outpatient setting, you will be told whom to contact to report any reactions. These symptoms may represent a serious problem that is an emergency. Do not wait to see if the symptoms will go away. Get medical help right away. Call your local emergency services (911 in the [place] Do not drive yourself to the hospital. Summary  Bruising and tenderness around the IV insertion site are common.  Check your IV insertion site every day for signs of infection.  Rest as told by your health care provider. Return to your normal activities as told by your health care provider.  Get help right away for symptoms of a serious allergic or immune system reaction to blood transfusion. This information is not intended to replace advice given to you by your health care provider. Make sure you discuss any questions you have with your health care provider. Document Revised: 07/17/2018 Document Reviewed: 07/17/2018 Elsevier Patient Education  2020 [employer]  

## 2019-06-02 NOTE — Progress Notes (Signed)
Seth [name] was seen in the infusion room today as he was receiving a transfusion of packed red blood cells.  He had been seen earlier in clinic and reported having significant right hip and right shoulder pain.  He had been given Tylenol and a 1 Benadryl.  Despite this he continued to have significant pain.  He was given 1 mg of morphine sulfate IV x1.  This was discussed with Dr. Julien Nordmann who expressed agreement with this plan.  Sandi Mealy, MHS, PA-C Physician Assistant

## 2019-06-03 DIAGNOSIS — R262 Difficulty in walking, not elsewhere classified: Secondary | ICD-10-CM | POA: Diagnosis not present

## 2019-06-03 DIAGNOSIS — M25672 Stiffness of left ankle, not elsewhere classified: Secondary | ICD-10-CM | POA: Diagnosis not present

## 2019-06-03 DIAGNOSIS — S82832D Other fracture of upper and lower end of left fibula, subsequent encounter for closed fracture with routine healing: Secondary | ICD-10-CM | POA: Diagnosis not present

## 2019-06-03 DIAGNOSIS — M6281 Muscle weakness (generalized): Secondary | ICD-10-CM | POA: Diagnosis not present

## 2019-06-03 LAB — TYPE AND SCREEN
ABO/RH(D): O POS
Antibody Screen: NEGATIVE
Unit division: 0

## 2019-06-03 LAB — BPAM RBC
Blood Product Expiration Date: 202105262359
ISSUE DATE / TIME: 202104270921
Unit Type and Rh: 5100

## 2019-06-05 DIAGNOSIS — M25672 Stiffness of left ankle, not elsewhere classified: Secondary | ICD-10-CM | POA: Diagnosis not present

## 2019-06-05 DIAGNOSIS — M6281 Muscle weakness (generalized): Secondary | ICD-10-CM | POA: Diagnosis not present

## 2019-06-05 DIAGNOSIS — R262 Difficulty in walking, not elsewhere classified: Secondary | ICD-10-CM | POA: Diagnosis not present

## 2019-06-05 DIAGNOSIS — S82832D Other fracture of upper and lower end of left fibula, subsequent encounter for closed fracture with routine healing: Secondary | ICD-10-CM | POA: Diagnosis not present

## 2019-06-09 ENCOUNTER — Other Ambulatory Visit: Payer: Self-pay

## 2019-06-09 ENCOUNTER — Telehealth: Payer: Self-pay | Admitting: Family Medicine

## 2019-06-09 ENCOUNTER — Emergency Department (HOSPITAL_COMMUNITY): Payer: Medicare Other

## 2019-06-09 ENCOUNTER — Encounter (HOSPITAL_COMMUNITY): Payer: Self-pay | Admitting: Emergency Medicine

## 2019-06-09 ENCOUNTER — Emergency Department (HOSPITAL_COMMUNITY)
Admission: EM | Admit: 2019-06-09 | Discharge: 2019-06-09 | Disposition: A | Discharge status: Home / Self Care | Payer: Medicare Other | Attending: Emergency Medicine | Admitting: Emergency Medicine

## 2019-06-09 ENCOUNTER — Telehealth: Payer: Self-pay

## 2019-06-09 DIAGNOSIS — R42 Dizziness and giddiness: Secondary | ICD-10-CM | POA: Insufficient documentation

## 2019-06-09 DIAGNOSIS — Z79899 Other long term (current) drug therapy: Secondary | ICD-10-CM | POA: Diagnosis not present

## 2019-06-09 DIAGNOSIS — Z87891 Personal history of nicotine dependence: Secondary | ICD-10-CM | POA: Diagnosis not present

## 2019-06-09 DIAGNOSIS — I10 Essential (primary) hypertension: Secondary | ICD-10-CM | POA: Diagnosis not present

## 2019-06-09 DIAGNOSIS — D649 Anemia, unspecified: Secondary | ICD-10-CM | POA: Insufficient documentation

## 2019-06-09 DIAGNOSIS — Z20822 Contact with and (suspected) exposure to covid-19: Secondary | ICD-10-CM | POA: Diagnosis not present

## 2019-06-09 DIAGNOSIS — Z7902 Long term (current) use of antithrombotics/antiplatelets: Secondary | ICD-10-CM | POA: Diagnosis not present

## 2019-06-09 DIAGNOSIS — C3490 Malignant neoplasm of unspecified part of unspecified bronchus or lung: Secondary | ICD-10-CM | POA: Diagnosis not present

## 2019-06-09 LAB — CBC WITH DIFFERENTIAL/PLATELET
Abs Immature Granulocytes: 0.04 10*3/uL (ref 0.00–0.07)
Basophils Absolute: 0 10*3/uL (ref 0.0–0.1)
Basophils Relative: 1 %
Eosinophils Absolute: 0 10*3/uL (ref 0.0–0.5)
Eosinophils Relative: 1 %
HCT: 22.4 % — ABNORMAL LOW (ref 39.0–52.0)
Hemoglobin: 7.3 g/dL — ABNORMAL LOW (ref 13.0–17.0)
Immature Granulocytes: 3 %
Lymphocytes Relative: 22 %
Lymphs Abs: 0.3 10*3/uL — ABNORMAL LOW (ref 0.7–4.0)
MCH: 31.1 pg (ref 26.0–34.0)
MCHC: 32.6 g/dL (ref 30.0–36.0)
MCV: 95.3 fL (ref 80.0–100.0)
Monocytes Absolute: 0.3 10*3/uL (ref 0.1–1.0)
Monocytes Relative: 24 %
Neutro Abs: 0.7 10*3/uL — ABNORMAL LOW (ref 1.7–7.7)
Neutrophils Relative %: 49 %
Platelets: 90 10*3/uL — ABNORMAL LOW (ref 150–400)
RBC: 2.35 MIL/uL — ABNORMAL LOW (ref 4.22–5.81)
RDW: 17.1 % — ABNORMAL HIGH (ref 11.5–15.5)
WBC: 1.4 10*3/uL — CL (ref 4.0–10.5)
nRBC: 0 % (ref 0.0–0.2)

## 2019-06-09 LAB — COMPREHENSIVE METABOLIC PANEL
ALT: 21 U/L (ref 0–44)
AST: 30 U/L (ref 15–41)
Albumin: 3.2 g/dL — ABNORMAL LOW (ref 3.5–5.0)
Alkaline Phosphatase: 122 U/L (ref 38–126)
Anion gap: 13 (ref 5–15)
BUN: 17 mg/dL (ref 8–23)
CO2: 23 mmol/L (ref 22–32)
Calcium: 8.6 mg/dL — ABNORMAL LOW (ref 8.9–10.3)
Chloride: 101 mmol/L (ref 98–111)
Creatinine, Ser: 0.86 mg/dL (ref 0.61–1.24)
GFR calc Af Amer: >60 mL/min (ref >60)
GFR calc non Af Amer: >60 mL/min (ref >60)
Glucose, Bld: 92 mg/dL (ref 70–99)
Potassium: 3.7 mmol/L (ref 3.5–5.1)
Sodium: 137 mmol/L (ref 135–145)
Total Bilirubin: 2.2 mg/dL — ABNORMAL HIGH (ref 0.3–1.2)
Total Protein: 6.9 g/dL (ref 6.5–8.1)

## 2019-06-09 LAB — RESPIRATORY PANEL BY RT PCR (FLU A&B, COVID)
Influenza A by PCR: NEGATIVE
Influenza B by PCR: NEGATIVE
SARS Coronavirus 2 by RT PCR: NEGATIVE

## 2019-06-09 LAB — PREPARE RBC (CROSSMATCH)

## 2019-06-09 MED ORDER — ACETAMINOPHEN 500 MG PO TABS
1000.0000 mg | ORAL_TABLET | Freq: Once | ORAL | Status: AC
  Administered 2019-06-09: 21:00:00 1000 mg via ORAL
  Filled 2019-06-09: qty 2

## 2019-06-09 MED ORDER — MECLIZINE HCL 25 MG PO TABS
25.0000 mg | ORAL_TABLET | Freq: Once | ORAL | Status: AC
  Administered 2019-06-09: 16:00:00 25 mg via ORAL
  Filled 2019-06-09: qty 1

## 2019-06-09 MED ORDER — SODIUM CHLORIDE 0.9 % IV BOLUS (SEPSIS)
1000.0000 mL | Freq: Once | INTRAVENOUS | Status: AC
  Administered 2019-06-09: 15:00:00 1000 mL via INTRAVENOUS

## 2019-06-09 MED ORDER — ONDANSETRON HCL 4 MG/2ML IJ SOLN
4.0000 mg | Freq: Once | INTRAMUSCULAR | Status: AC
  Administered 2019-06-09: 15:00:00 4 mg via INTRAVENOUS
  Filled 2019-06-09: qty 2

## 2019-06-09 MED ORDER — ONDANSETRON HCL 4 MG/2ML IJ SOLN
4.0000 mg | Freq: Once | INTRAMUSCULAR | Status: AC
  Administered 2019-06-09: 16:00:00 4 mg via INTRAVENOUS
  Filled 2019-06-09: qty 2

## 2019-06-09 MED ORDER — SODIUM CHLORIDE 0.9 % IV SOLN
10.0000 mL/h | Freq: Once | INTRAVENOUS | Status: AC
  Administered 2019-06-09: 21:00:00 10 mL/h via INTRAVENOUS

## 2019-06-09 MED ORDER — HEPARIN SOD (PORK) LOCK FLUSH 100 UNIT/ML IV SOLN
500.0000 [IU] | Freq: Once | INTRAVENOUS | Status: AC
  Administered 2019-06-09: 23:00:00 500 [IU]
  Filled 2019-06-09: qty 5

## 2019-06-09 NOTE — ED Triage Notes (Signed)
Reports dizziness x 3 days. Wife states that appetite has been less than baseline. Urine is darker in color. Has stage 4 cancer.

## 2019-06-09 NOTE — Telephone Encounter (Signed)
Called patient reference his msg about dizziness but the patient was already at the ER. I advised patient that Dr Carlota Raspberry was not in the office today and I was going to advise for him to go to the urgent care or to the ER if symptoms worsen. But since he was already at the ER then I would disregard msg and forward this to Dr Carlota Raspberry so he would be aware.

## 2019-06-09 NOTE — Telephone Encounter (Signed)
Agree with ER eval.

## 2019-06-09 NOTE — ED Provider Notes (Signed)
Seth [name] DEPT Provider Note   CSN: [id number] Arrival date & time: 06/09/19  1238     History Chief Complaint  Patient presents with  . Dizziness    Seth [name] is a 74 y.o. male.  Patient complains of dizziness.  This started today.  Patient has a history of lung cancer  The history is provided by the patient. No language interpreter was used.  Dizziness Quality:  Head spinning Severity:  Moderate Onset quality:  Sudden Timing:  Constant Progression:  Worsening Chronicity:  New Context: bending over   Relieved by:  Nothing Worsened by:  Nothing Ineffective treatments:  None tried Associated symptoms: no blood in stool, no chest pain, no diarrhea and no headaches        Past Medical History:  Diagnosis Date  . AAA (abdominal aortic aneurysm) ([place])   . Allergic rhinitis   . Anxiety   . Back pain   . CAD (coronary artery disease)   . Cataracts, bilateral   . Emphysema, unspecified ([place])     " moderate"  . Essential hypertension, benign   . GERD (gastroesophageal reflux disease)   . Gout   . Headache    migraines  . Hilar adenopathy   . History of hiatal hernia   . History of sciatica   . Hyperlipidemia   . lung ca dx'd 02/2018  . Lung nodule   . Pneumonia    as child  . Prostate hypertrophy   . PVD (peripheral vascular disease) ([place])   . Seasonal allergies   . Subclavian steal syndrome of left subclavian artery   . Wears dentures     Patient Active Problem List   Diagnosis Date Noted  . Hypomagnesemia 06/01/2019  . Hypokalemia 06/01/2019  . Constipation 06/01/2019  . Port-A-Cath in place 04/21/2019  . Dizziness 04/19/2019  . Anemia 04/19/2019  . Severe anemia 04/19/2019  . Hypotension 04/18/2019  . Elevated troponin level 04/18/2019  . Elevated troponin 04/18/2019  . Adenocarcinoma of left lung, stage 4 ([place]) 02/03/2019  . Encounter for antineoplastic immunotherapy 02/03/2019  . Frequent nosebleeds 05/28/2018   . Encounter for antineoplastic chemotherapy 04/18/2018  . Goals of care, counseling/discussion 04/18/2018  . Adenocarcinoma of left lung, stage 2 ([place]) 03/27/2018  . Abnormal PET of left lung   . S/P bronchoscopy   . Solitary lung nodule 02/20/2018  . Hilar adenopathy 02/20/2018  . Abnormal findings on diagnostic imaging of lung 02/20/2018  . Platelet inhibition due to Plavix 02/20/2018  . PVD (peripheral vascular disease) ([place]) 10/15/2013  . Tobacco use disorder 10/15/2013  . Pure hypercholesterolemia 10/15/2013  . Coronary artery disease involving native coronary artery of native heart without angina pectoris 10/15/2013  . Cataract 09/29/2013  . Colon cancer screening 09/25/2013  . AAA (abdominal aortic aneurysm) without rupture ([place]) 10/22/2012  . Nicotine addiction 10/17/2012  . Atherosclerosis of native arteries of the extremities with ulceration(440.23) 10/17/2012  . Carotid bruit 10/17/2012  . Other nonspecific abnormal cardiovascular system function study 04/10/2012  . History of sciatica   . Essential hypertension, benign   . Seasonal allergies     Past Surgical History:  Procedure Laterality Date  . CARDIAC CATHETERIZATION     stent placement  . COLONOSCOPY W/ BIOPSIES AND POLYPECTOMY    . ILIAC ARTERY STENT    . IR IMAGING GUIDED PORT INSERTION  03/23/2019  . MULTIPLE TOOTH EXTRACTIONS    . VIDEO BRONCHOSCOPY WITH ENDOBRONCHIAL NAVIGATION N/A 02/26/2018   Procedure: VIDEO BRONCHOSCOPY  WITH ENDOBRONCHIAL NAVIGATION;  Surgeon: Garner Nash, DO;  Location: [facility];  Service: Thoracic;  Laterality: N/A;  . VIDEO BRONCHOSCOPY WITH ENDOBRONCHIAL NAVIGATION N/A 03/19/2018   Procedure: VIDEO BRONCHOSCOPY WITH ENDOBRONCHIAL NAVIGATION and endobronchial ultrasound;  Surgeon: Garner Nash, DO;  Location: [facility];  Service: Thoracic;  Laterality: N/A;  . VIDEO BRONCHOSCOPY WITH ENDOBRONCHIAL ULTRASOUND N/A 02/26/2018   Procedure: VIDEO BRONCHOSCOPY WITH ENDOBRONCHIAL  ULTRASOUND;  Surgeon: Garner Nash, DO;  Location: [facility];  Service: Thoracic;  Laterality: N/A;  . VIDEO BRONCHOSCOPY WITH ENDOBRONCHIAL ULTRASOUND N/A 03/19/2018   Procedure: NAVIGATION BRONCHOSCOPY;  Surgeon: Garner Nash, DO;  Location: [facility];  Service: Thoracic;  Laterality: N/A;       Family History  Problem Relation Age of Onset  . Hypertension Mother   . Heart disease Mother   . Stroke Mother   . Heart disease Paternal Grandmother   . Diabetes Paternal Grandmother   . Hyperlipidemia Paternal Grandmother   . Diabetes Sister   . Heart disease Sister   . Hyperlipidemia Sister   . Heart disease Brother   . Hyperlipidemia Brother   . Hypertension Brother   . Diabetes Paternal Grandfather   . Heart disease Paternal Grandfather   . Hyperlipidemia Paternal Grandfather   . Heart disease Maternal Uncle        x 2  . Cancer Maternal Grandmother        type unknown, ? lung  . Leukemia Maternal Uncle     Social History   Tobacco Use  . Smoking status: Former Smoker    Packs/day: 3.00    Years: 50.00    Pack years: 150.00    Types: Cigarettes    Quit date: 04/05/2016    Years since quitting: 3.1  . Smokeless tobacco: Never Used  Substance Use Topics  . Alcohol use: Yes    Alcohol/week: 0.0 standard drinks    Comment: rare  . Drug use: No    Home Medications Prior to Admission medications   Medication Sig Start Date End Date Taking? Authorizing Provider  acetaminophen (TYLENOL) 325 MG tablet Take 650 mg by mouth daily as needed for mild pain.   Yes [provider]  atorvastatin (LIPITOR) 40 MG tablet TAKE 1 TABLET BY MOUTH EVERY DAY Patient taking differently: Take 40 mg by mouth daily.  04/24/19  Yes Turner, Eber Hong, MD  cetirizine (ZYRTEC) 10 MG tablet Take 10 mg by mouth daily as needed for allergies.    Yes [provider]  clopidogrel (PLAVIX) 75 MG tablet TAKE 1 TABLET BY MOUTH EVERY DAY Patient taking differently: Take 75 mg by mouth  daily.  05/18/19  Yes [name], MD  famotidine (PEPCID) 20 MG tablet Take 20 mg by mouth 2 (two) times daily.    Yes [provider]  folic acid (FOLVITE) 1 MG tablet TAKE 1 TABLET BY MOUTH EVERY DAY Patient taking differently: Take 1 mg by mouth daily.  05/04/19  Yes Curt Bears, MD  metoprolol tartrate (LOPRESSOR) 25 MG tablet Take 1 tablet (25 mg total) by mouth 2 (two) times daily. Only take if systolic blood pressure is >110 04/06/19  Yes Turner, Eber Hong, MD  polyethylene glycol (MIRALAX) 17 g packet Take 17 g by mouth daily as needed for moderate constipation. 04/20/19  Yes Pokhrel, Laxman, MD  Potassium Chloride ER 20 MEQ TBCR Take 20 mEq by mouth daily. 06/01/19  Yes Heilingoetter, Cassandra L, PA-C  sodium chloride (OCEAN) 0.65 %  SOLN nasal spray Place 1 spray into both nostrils 2 (two) times daily as needed for congestion.   Yes [provider]  magnesium oxide (MAG-OX) 400 (241.3 Mg) MG tablet Take 1 tablet (400 mg total) by mouth 2 (two) times daily. Patient not taking: Reported on 06/09/2019 04/20/19   Flora Lipps, MD  prochlorperazine (COMPAZINE) 10 MG tablet Take 1 tablet (10 mg total) by mouth every 6 (six) hours as needed for nausea or vomiting. Patient not taking: Reported on 06/09/2019 02/03/19   Curt Bears, MD    Allergies    Biaxin [clarithromycin], Clarithromycin, Iodinated diagnostic agents, Prednisone, Cortisone, Flomax [tamsulosin hcl], Hepatitis b virus vaccines, and Iodine  Review of Systems   Review of Systems  Constitutional: Negative for appetite change and fatigue.  HENT: Negative for congestion, ear discharge and sinus pressure.   Eyes: Negative for discharge.  Respiratory: Negative for cough.   Cardiovascular: Negative for chest pain.  Gastrointestinal: Negative for abdominal pain, blood in stool and diarrhea.  Genitourinary: Negative for frequency and hematuria.  Musculoskeletal: Negative for back pain.  Skin: Negative for  rash.  Neurological: Positive for dizziness. Negative for seizures and headaches.  Psychiatric/Behavioral: Negative for hallucinations.    Physical Exam Updated Vital Signs BP 126/72   Pulse (!) 110   Resp 18   SpO2 100%   Physical Exam Vitals and nursing note reviewed.  Constitutional:      Appearance: He is well-developed.  HENT:     Head: Normocephalic.     Nose: Nose normal.  Eyes:     General: No scleral icterus.    Conjunctiva/sclera: Conjunctivae normal.  Neck:     Thyroid: No thyromegaly.  Cardiovascular:     Rate and Rhythm: Normal rate and regular rhythm.     Heart sounds: No murmur. No friction rub. No gallop.   Pulmonary:     Breath sounds: No stridor. No wheezing or rales.  Chest:     Chest wall: No tenderness.  Abdominal:     General: There is no distension.     Tenderness: There is no abdominal tenderness. There is no rebound.  Musculoskeletal:        General: Normal range of motion.     Cervical back: Neck supple.  Lymphadenopathy:     Cervical: No cervical adenopathy.  Skin:    Findings: No erythema or rash.  Neurological:     Mental Status: He is alert and oriented to person, place, and time.     Motor: No abnormal muscle tone.     Coordination: Coordination normal.  Psychiatric:        Behavior: Behavior normal.     ED Results / Procedures / Treatments   Labs (all labs ordered are listed, but only abnormal results are displayed) Labs Reviewed  RESPIRATORY PANEL BY RT PCR (FLU A&B, COVID)  CBC WITH DIFFERENTIAL/PLATELET  COMPREHENSIVE METABOLIC PANEL    EKG None  Radiology CT Head Wo Contrast  Result Date: 06/09/2019 CLINICAL DATA:  Dizziness, ataxia. EXAM: CT HEAD WITHOUT CONTRAST TECHNIQUE: Contiguous axial images were obtained from the base of the skull through the vertex without intravenous contrast. COMPARISON:  None. FINDINGS: Brain: Mild chronic ischemic white matter disease is noted. No mass effect or midline shift is noted.  Ventricular size is within normal limits. There is no evidence of mass lesion, hemorrhage or acute infarction. Vascular: No hyperdense vessel or unexpected calcification. Skull: Normal. Negative for fracture or focal lesion. Sinuses/Orbits: No acute finding. Other: Fluid  is noted in right mastoid air cells. IMPRESSION: Mild chronic ischemic white matter disease. No acute intracranial abnormality seen. Electronically Signed   By: Marijo Conception M.D.   On: 06/09/2019 13:55    Procedures Procedures (including critical care time)  Medications Ordered in ED Medications  meclizine (ANTIVERT) tablet 25 mg (0 mg Oral Hold 06/09/19 1506)  ondansetron (ZOFRAN) injection 4 mg (has no administration in time range)  sodium chloride 0.9 % bolus 1,000 mL (1,000 mLs Intravenous New Bag/Given 06/09/19 1456)  ondansetron (ZOFRAN) injection 4 mg (4 mg Intravenous Given 06/09/19 1456)    ED Course  I have reviewed the triage vital signs and the nursing notes.  Pertinent labs & imaging results that were available during my care of the patient were reviewed by me and considered in my medical decision making (see chart for details).    MDM Rules/Calculators/A&P                      Patient with lung cancer and dizziness.  We will try treating him with Zofran and Antivert.  CT scan of the head negative labs pending      This patient presents to the ED for concern of dizziness, this involves an extensive number of treatment options, and is a complaint that carries with it a high risk of complications and morbidity.  The differential diagnosis includes vertigo and stroke   Lab Tests:   I Ordered, reviewed, and interpreted labs, which included CBC chemistries which showed anemia and leukopenia  Medicines ordered:   I ordered medication Zofran and Antivert for dizziness and nausea  Imaging Studies ordered:   I ordered imaging studies which included CT scan of the head and  I independently visualized  and interpreted imaging which showed no acute disease  Additional history obtained:   Additional history obtained from wife  Previous records obtained and reviewed   Consultations Obtained:     Reevaluation:  After the interventions stated above, I reevaluated the patient and found unchanged  Critical Interventions:  .   Final Clinical Impression(s) / ED Diagnoses Final diagnoses:  None    Rx / DC Orders ED Discharge Orders    None       Milton Ferguson, MD 06/09/19 1640

## 2019-06-09 NOTE — Telephone Encounter (Signed)
Pt called stating that he is having bout of dizziness and feeling as if he wants to pas out. He was told by his pcp if he were to feel this way to go to the er. Pt was instructed to go to er, being driven by someone at his home.

## 2019-06-09 NOTE — ED Notes (Signed)
Date and time results received: 06/09/19 3:30 PM  (use smartphrase ".now" to insert current time)  Test: WBC Critical Value: 1.4  Name of Provider Notified: [facility]  Orders Received? Or Actions Taken?: Orders Received - See Orders for details

## 2019-06-09 NOTE — Telephone Encounter (Signed)
Pt called and is wanting provider to call him back to answer his question. Per pt he is dizzy and wants to know if he should go to emergency room or just ignore it. Was going to direct pt to a nurse but pt hung up phone call. [phone] Please advise.

## 2019-06-10 LAB — TYPE AND SCREEN
ABO/RH(D): O POS
Antibody Screen: NEGATIVE
Unit division: 0

## 2019-06-10 LAB — BPAM RBC
Blood Product Expiration Date: 202106052359
ISSUE DATE / TIME: 202105042028
Unit Type and Rh: 5100

## 2019-06-12 DIAGNOSIS — R262 Difficulty in walking, not elsewhere classified: Secondary | ICD-10-CM | POA: Diagnosis not present

## 2019-06-12 DIAGNOSIS — M6281 Muscle weakness (generalized): Secondary | ICD-10-CM | POA: Diagnosis not present

## 2019-06-12 DIAGNOSIS — M25672 Stiffness of left ankle, not elsewhere classified: Secondary | ICD-10-CM | POA: Diagnosis not present

## 2019-06-12 DIAGNOSIS — S82832D Other fracture of upper and lower end of left fibula, subsequent encounter for closed fracture with routine healing: Secondary | ICD-10-CM | POA: Diagnosis not present

## 2019-06-16 NOTE — Progress Notes (Signed)
Pharmacist Chemotherapy Monitoring - Follow Up Assessment    I verify that I have reviewed each item in the below checklist:  . Regimen for the patient is scheduled for the appropriate day and plan matches scheduled date. Marland Kitchen Appropriate non-routine labs are ordered dependent on drug ordered. . If applicable, additional medications reviewed and ordered per protocol based on lifetime cumulative doses and/or treatment regimen.   Plan for follow-up and/or issues identified: No . I-vent associated with next due treatment: No . MD and/or nursing notified: No  Seth [name],Seth [name] 06/16/2019 2:41 PM

## 2019-06-18 ENCOUNTER — Ambulatory Visit (HOSPITAL_COMMUNITY)
Admission: RE | Admit: 2019-06-18 | Discharge: 2019-06-18 | Disposition: A | Discharge status: Home / Self Care | Payer: Medicare Other | Source: Ambulatory Visit | Attending: Physician Assistant | Admitting: Physician Assistant

## 2019-06-18 ENCOUNTER — Other Ambulatory Visit: Payer: Self-pay

## 2019-06-18 DIAGNOSIS — C3492 Malignant neoplasm of unspecified part of left bronchus or lung: Secondary | ICD-10-CM | POA: Insufficient documentation

## 2019-06-18 DIAGNOSIS — C3490 Malignant neoplasm of unspecified part of unspecified bronchus or lung: Secondary | ICD-10-CM | POA: Diagnosis not present

## 2019-06-19 ENCOUNTER — Telehealth: Payer: Self-pay | Admitting: Physician Assistant

## 2019-06-19 NOTE — Telephone Encounter (Signed)
Called the patient to discuss his CT scan from yesterday. Left a voicemail for the patient to call me back to discuss the results.

## 2019-06-19 NOTE — Telephone Encounter (Signed)
Spoke to the patient and his wife about his recent CT scan. The scan noted a new lesion on the left acetabulum at risk for fracture. Advised the patient to avoid strenous activity and weight bearing on that hip. Dr. Julien Nordmann advised me that he would have a more detailed discussion with the patient on Monday during his appointment about management of his current condition. They expressed understanding.

## 2019-06-22 ENCOUNTER — Inpatient Hospital Stay: Payer: Medicare Other

## 2019-06-22 ENCOUNTER — Other Ambulatory Visit: Payer: Self-pay

## 2019-06-22 ENCOUNTER — Encounter: Payer: Self-pay | Admitting: Internal Medicine

## 2019-06-22 ENCOUNTER — Inpatient Hospital Stay: Payer: Medicare Other | Attending: Internal Medicine | Admitting: Internal Medicine

## 2019-06-22 ENCOUNTER — Inpatient Hospital Stay: Payer: Medicare Other | Admitting: Nutrition

## 2019-06-22 VITALS — BP 128/74 | HR 115 | Temp 98.7°F | Resp 20 | Ht 73.0 in | Wt 152.3 lb

## 2019-06-22 DIAGNOSIS — C3492 Malignant neoplasm of unspecified part of left bronchus or lung: Secondary | ICD-10-CM

## 2019-06-22 DIAGNOSIS — Z87891 Personal history of nicotine dependence: Secondary | ICD-10-CM | POA: Insufficient documentation

## 2019-06-22 DIAGNOSIS — R5382 Chronic fatigue, unspecified: Secondary | ICD-10-CM

## 2019-06-22 DIAGNOSIS — R630 Anorexia: Secondary | ICD-10-CM | POA: Insufficient documentation

## 2019-06-22 DIAGNOSIS — E861 Hypovolemia: Secondary | ICD-10-CM | POA: Insufficient documentation

## 2019-06-22 DIAGNOSIS — Z806 Family history of leukemia: Secondary | ICD-10-CM | POA: Insufficient documentation

## 2019-06-22 DIAGNOSIS — E876 Hypokalemia: Secondary | ICD-10-CM | POA: Insufficient documentation

## 2019-06-22 DIAGNOSIS — M549 Dorsalgia, unspecified: Secondary | ICD-10-CM | POA: Diagnosis not present

## 2019-06-22 DIAGNOSIS — I739 Peripheral vascular disease, unspecified: Secondary | ICD-10-CM | POA: Insufficient documentation

## 2019-06-22 DIAGNOSIS — D61818 Other pancytopenia: Secondary | ICD-10-CM | POA: Diagnosis not present

## 2019-06-22 DIAGNOSIS — R5383 Other fatigue: Secondary | ICD-10-CM | POA: Insufficient documentation

## 2019-06-22 DIAGNOSIS — I10 Essential (primary) hypertension: Secondary | ICD-10-CM | POA: Insufficient documentation

## 2019-06-22 DIAGNOSIS — C7951 Secondary malignant neoplasm of bone: Secondary | ICD-10-CM | POA: Diagnosis not present

## 2019-06-22 DIAGNOSIS — I2510 Atherosclerotic heart disease of native coronary artery without angina pectoris: Secondary | ICD-10-CM | POA: Diagnosis not present

## 2019-06-22 DIAGNOSIS — M25552 Pain in left hip: Secondary | ICD-10-CM | POA: Insufficient documentation

## 2019-06-22 DIAGNOSIS — Z79899 Other long term (current) drug therapy: Secondary | ICD-10-CM | POA: Insufficient documentation

## 2019-06-22 DIAGNOSIS — R0609 Other forms of dyspnea: Secondary | ICD-10-CM | POA: Insufficient documentation

## 2019-06-22 DIAGNOSIS — Z888 Allergy status to other drugs, medicaments and biological substances status: Secondary | ICD-10-CM | POA: Insufficient documentation

## 2019-06-22 DIAGNOSIS — I313 Pericardial effusion (noninflammatory): Secondary | ICD-10-CM | POA: Insufficient documentation

## 2019-06-22 DIAGNOSIS — Z8349 Family history of other endocrine, nutritional and metabolic diseases: Secondary | ICD-10-CM | POA: Insufficient documentation

## 2019-06-22 DIAGNOSIS — N3289 Other specified disorders of bladder: Secondary | ICD-10-CM | POA: Insufficient documentation

## 2019-06-22 DIAGNOSIS — I6782 Cerebral ischemia: Secondary | ICD-10-CM | POA: Insufficient documentation

## 2019-06-22 DIAGNOSIS — Z8249 Family history of ischemic heart disease and other diseases of the circulatory system: Secondary | ICD-10-CM | POA: Diagnosis not present

## 2019-06-22 DIAGNOSIS — C3411 Malignant neoplasm of upper lobe, right bronchus or lung: Secondary | ICD-10-CM | POA: Diagnosis not present

## 2019-06-22 DIAGNOSIS — Z833 Family history of diabetes mellitus: Secondary | ICD-10-CM | POA: Insufficient documentation

## 2019-06-22 DIAGNOSIS — M898X9 Other specified disorders of bone, unspecified site: Secondary | ICD-10-CM | POA: Diagnosis not present

## 2019-06-22 DIAGNOSIS — K5900 Constipation, unspecified: Secondary | ICD-10-CM | POA: Diagnosis not present

## 2019-06-22 DIAGNOSIS — Z808 Family history of malignant neoplasm of other organs or systems: Secondary | ICD-10-CM | POA: Insufficient documentation

## 2019-06-22 DIAGNOSIS — I9589 Other hypotension: Secondary | ICD-10-CM

## 2019-06-22 LAB — CMP (CANCER CENTER ONLY)
ALT: 12 U/L (ref 0–44)
AST: 36 U/L (ref 15–41)
Albumin: 3 g/dL — ABNORMAL LOW (ref 3.5–5.0)
Alkaline Phosphatase: 161 U/L — ABNORMAL HIGH (ref 38–126)
Anion gap: 18 — ABNORMAL HIGH (ref 5–15)
BUN: 14 mg/dL (ref 8–23)
CO2: 23 mmol/L (ref 22–32)
Calcium: 9.3 mg/dL (ref 8.9–10.3)
Chloride: 94 mmol/L — ABNORMAL LOW (ref 98–111)
Creatinine: 0.79 mg/dL (ref 0.61–1.24)
GFR, Est AFR Am: >60 mL/min (ref >60)
GFR, Estimated: >60 mL/min (ref >60)
Glucose, Bld: 98 mg/dL (ref 70–99)
Potassium: 3.3 mmol/L — ABNORMAL LOW (ref 3.5–5.1)
Sodium: 135 mmol/L (ref 135–145)
Total Bilirubin: 1.2 mg/dL (ref 0.3–1.2)
Total Protein: 7.2 g/dL (ref 6.5–8.1)

## 2019-06-22 LAB — CBC WITH DIFFERENTIAL (CANCER CENTER ONLY)
Abs Immature Granulocytes: 0.07 10*3/uL (ref 0.00–0.07)
Basophils Absolute: 0 10*3/uL (ref 0.0–0.1)
Basophils Relative: 0 %
Eosinophils Absolute: 0 10*3/uL (ref 0.0–0.5)
Eosinophils Relative: 1 %
HCT: 27.8 % — ABNORMAL LOW (ref 39.0–52.0)
Hemoglobin: 9 g/dL — ABNORMAL LOW (ref 13.0–17.0)
Immature Granulocytes: 1 %
Lymphocytes Relative: 7 %
Lymphs Abs: 0.5 10*3/uL — ABNORMAL LOW (ref 0.7–4.0)
MCH: 30.2 pg (ref 26.0–34.0)
MCHC: 32.4 g/dL (ref 30.0–36.0)
MCV: 93.3 fL (ref 80.0–100.0)
Monocytes Absolute: 1 10*3/uL (ref 0.1–1.0)
Monocytes Relative: 12 %
Neutro Abs: 6.4 10*3/uL (ref 1.7–7.7)
Neutrophils Relative %: 79 %
Platelet Count: 237 10*3/uL (ref 150–400)
RBC: 2.98 MIL/uL — ABNORMAL LOW (ref 4.22–5.81)
RDW: 19.1 % — ABNORMAL HIGH (ref 11.5–15.5)
WBC Count: 8.1 10*3/uL (ref 4.0–10.5)
nRBC: 0 % (ref 0.0–0.2)

## 2019-06-22 LAB — SAMPLE TO BLOOD BANK

## 2019-06-22 LAB — TSH: TSH: 2.011 u[IU]/mL (ref 0.320–4.118)

## 2019-06-22 MED ORDER — METHYLPREDNISOLONE 4 MG PO TBPK: ORAL_TABLET | ORAL | 0 refills | Status: AC

## 2019-06-22 MED ORDER — SODIUM CHLORIDE 0.9% FLUSH
10.0000 mL | Freq: Once | INTRAVENOUS | Status: AC | PRN
  Administered 2019-06-22: 10 mL
  Filled 2019-06-22: qty 10

## 2019-06-22 MED ORDER — OXYCODONE-ACETAMINOPHEN 5-325 MG PO TABS: 1.0000 | ORAL_TABLET | Freq: Three times a day (TID) | ORAL | 0 refills | Status: DC | PRN

## 2019-06-22 NOTE — Progress Notes (Signed)
Nutrition  RD planning to see patient during infusion today for nutrition follow-up but infusion cancelled.    Noted patient considering second line systemic therapy or palliative care/hospice.    Ubaldo Daywalt B. Zenia Resides, [place], [place] Registered Dietitian [phone] (pager)

## 2019-06-22 NOTE — Progress Notes (Signed)
[place] [phone]   [phone]  OFFICE PROGRESS NOTE  [name], MD [place] [address]  DIAGNOSIS: Metastatic non-small cell lung cancer, adenocarcinoma initially diagnosed as stage IIB (T1c, N1, M0) non-small cell lung cancer favoring adenocarcinoma presented with right upper lobe lung nodule in addition to right hilar lymphadenopathy diagnosed in February 2020.  The patient is not a good surgical candidate for resection.  He had evidence for disease recurrence in December 2020 with adrenal gland metastasis.  PRIOR THERAPY: Concurrent chemoradiation with weekly carboplatin for AUC of 2 and paclitaxel 45 mg/M2.  First dose May 05, 2018.  Status post 7 cycles.  Last dose was given Jun 16, 2018  CURRENT THERAPY: Systemic chemotherapy with carboplatin for AUC of 5, Alimta 500 mg/M2 and Keytruda 200 mg IV every 3 weeks.  First dose starting February 17, 2019. Status post 6 cycles.  Starting from cycle #5 he will be treated with maintenance Alimta and Keytruda every 3 weeks.  This treatment was discontinued secondary to disease progression.  INTERVAL HISTORY: Seth [name] 74 y.o. male returns to the clinic today for follow-up visit accompanied by his wife.  The patient continues to complain of pain in the left hip as well as the back.  He also has lack of appetite and weight loss.  He denied having any current chest pain, shortness of breath except with exertion with no cough or hemoptysis.  He denied having any fever or chills.  He has no nausea, vomiting, diarrhea or constipation.  He had significant pancytopenia with his previous chemotherapy and the patient required PRBCs transfusion.  He had repeat CT scan of the chest, abdomen pelvis performed recently and is here for evaluation and discussion of his scan results.    MEDICAL HISTORY: Past Medical History:  Diagnosis Date  . AAA (abdominal aortic aneurysm) ([place])   .  Allergic rhinitis   . Anxiety   . Back pain   . CAD (coronary artery disease)   . Cataracts, bilateral   . Emphysema, unspecified ([place])     " moderate"  . Essential hypertension, benign   . GERD (gastroesophageal reflux disease)   . Gout   . Headache    migraines  . Hilar adenopathy   . History of hiatal hernia   . History of sciatica   . Hyperlipidemia   . lung ca dx'd 02/2018  . Lung nodule   . Pneumonia    as child  . Prostate hypertrophy   . PVD (peripheral vascular disease) ([place])   . Seasonal allergies   . Subclavian steal syndrome of left subclavian artery   . Wears dentures     ALLERGIES:  is allergic to biaxin [clarithromycin]; clarithromycin; iodinated diagnostic agents; prednisone; cortisone; flomax [tamsulosin hcl]; hepatitis b virus vaccines; and iodine.  MEDICATIONS:  Current Outpatient Medications  Medication Sig Dispense Refill  . acetaminophen (TYLENOL) 325 MG tablet Take 650 mg by mouth daily as needed for mild pain.    Marland Kitchen atorvastatin (LIPITOR) 40 MG tablet TAKE 1 TABLET BY MOUTH EVERY DAY (Patient taking differently: Take 40 mg by mouth daily. ) 90 tablet 3  . cetirizine (ZYRTEC) 10 MG tablet Take 10 mg by mouth daily as needed for allergies.     Marland Kitchen clopidogrel (PLAVIX) 75 MG tablet TAKE 1 TABLET BY MOUTH EVERY DAY (Patient taking differently: Take 75 mg by mouth daily. ) 90 tablet 0  . famotidine (PEPCID) 20  MG tablet Take 20 mg by mouth 2 (two) times daily.     . folic acid (FOLVITE) 1 MG tablet TAKE 1 TABLET BY MOUTH EVERY DAY (Patient taking differently: Take 1 mg by mouth daily. ) 90 tablet 1  . magnesium oxide (MAG-OX) 400 (241.3 Mg) MG tablet Take 1 tablet (400 mg total) by mouth 2 (two) times daily. (Patient not taking: Reported on 06/09/2019) 60 tablet 0  . metoprolol tartrate (LOPRESSOR) 25 MG tablet Take 1 tablet (25 mg total) by mouth 2 (two) times daily. Only take if systolic blood pressure is >110 180 tablet 3  . polyethylene glycol (MIRALAX) 17 g  packet Take 17 g by mouth daily as needed for moderate constipation. 14 each 0  . Potassium Chloride ER 20 MEQ TBCR Take 20 mEq by mouth daily. 7 tablet 0  . prochlorperazine (COMPAZINE) 10 MG tablet Take 1 tablet (10 mg total) by mouth every 6 (six) hours as needed for nausea or vomiting. (Patient not taking: Reported on 06/09/2019) 30 tablet 0  . sodium chloride (OCEAN) 0.65 % SOLN nasal spray Place 1 spray into both nostrils 2 (two) times daily as needed for congestion.     No current facility-administered medications for this visit.   Facility-Administered Medications Ordered in Other Visits  Medication Dose Route Frequency Provider Last Rate Last Admin  . heparin lock flush 100 unit/mL  500 Units Intravenous Once Curt Bears, MD      . sodium chloride flush (NS) 0.9 % injection 10 mL  10 mL Intravenous PRN Curt Bears, MD   10 mL at 04/21/19 1306  . sodium chloride flush (NS) 0.9 % injection 3 mL  3 mL Intracatheter PRN Curt Bears, MD        SURGICAL HISTORY:  Past Surgical History:  Procedure Laterality Date  . CARDIAC CATHETERIZATION     stent placement  . COLONOSCOPY W/ BIOPSIES AND POLYPECTOMY    . ILIAC ARTERY STENT    . IR IMAGING GUIDED PORT INSERTION  03/23/2019  . MULTIPLE TOOTH EXTRACTIONS    . VIDEO BRONCHOSCOPY WITH ENDOBRONCHIAL NAVIGATION N/A 02/26/2018   Procedure: VIDEO BRONCHOSCOPY WITH ENDOBRONCHIAL NAVIGATION;  Surgeon: Garner Nash, DO;  Location: [facility];  Service: Thoracic;  Laterality: N/A;  . VIDEO BRONCHOSCOPY WITH ENDOBRONCHIAL NAVIGATION N/A 03/19/2018   Procedure: VIDEO BRONCHOSCOPY WITH ENDOBRONCHIAL NAVIGATION and endobronchial ultrasound;  Surgeon: Garner Nash, DO;  Location: [facility];  Service: Thoracic;  Laterality: N/A;  . VIDEO BRONCHOSCOPY WITH ENDOBRONCHIAL ULTRASOUND N/A 02/26/2018   Procedure: VIDEO BRONCHOSCOPY WITH ENDOBRONCHIAL ULTRASOUND;  Surgeon: Garner Nash, DO;  Location: [facility];  Service: Thoracic;  Laterality: N/A;    . VIDEO BRONCHOSCOPY WITH ENDOBRONCHIAL ULTRASOUND N/A 03/19/2018   Procedure: NAVIGATION BRONCHOSCOPY;  Surgeon: Garner Nash, DO;  Location: [facility];  Service: Thoracic;  Laterality: N/A;    REVIEW OF SYSTEMS:  Constitutional: positive for fatigue and weight loss Eyes: negative Ears, nose, mouth, throat, and face: negative Respiratory: positive for dyspnea on exertion Cardiovascular: negative Gastrointestinal: negative Genitourinary:negative Integument/breast: negative Hematologic/lymphatic: negative Musculoskeletal:positive for back pain and bone pain Neurological: negative Behavioral/Psych: negative Endocrine: negative Allergic/Immunologic: negative   PHYSICAL EXAMINATION: General appearance: alert, cooperative, fatigued and no distress Head: Normocephalic, without obvious abnormality, atraumatic Neck: no adenopathy, no JVD, supple, symmetrical, trachea midline and thyroid not enlarged, symmetric, no tenderness/mass/nodules Lymph nodes: Cervical, supraclavicular, and axillary nodes normal. Resp: clear to auscultation bilaterally Back: symmetric, no curvature. ROM normal. No CVA tenderness. Cardio: regular rate  and rhythm, S1, S2 normal, no murmur, click, rub or gallop GI: soft, non-tender; bowel sounds normal; no masses,  no organomegaly Extremities: extremities normal, atraumatic, no cyanosis or edema Neurologic: Alert and oriented X 3, normal strength and tone. Normal symmetric reflexes. Normal coordination and gait  ECOG PERFORMANCE STATUS: 2 - Symptomatic, <50% confined to bed  Blood pressure 128/74, pulse (!) 115, temperature 98.7 F (37.1 C), temperature source Temporal, resp. rate 20, height 6\' 1"  (1.854 m), weight 152 lb 4.8 oz (69.1 kg), SpO2 100 %.   LABORATORY DATA: Lab Results  Component Value Date   WBC 8.1 06/22/2019   HGB 9.0 (L) 06/22/2019   HCT 27.8 (L) 06/22/2019   MCV 93.3 06/22/2019   PLT 237 06/22/2019      Chemistry      Component Value  Date/Time   NA 137 06/09/2019 1452   NA 133 (L) 01/19/2019 1457   K 3.7 06/09/2019 1452   CL 101 06/09/2019 1452   CO2 23 06/09/2019 1452   BUN 17 06/09/2019 1452   BUN 9 01/19/2019 1457   CREATININE 0.86 06/09/2019 1452   CREATININE 0.86 06/01/2019 1047   CREATININE 1.09 03/23/2015 0928      Component Value Date/Time   CALCIUM 8.6 (L) 06/09/2019 1452   ALKPHOS 122 06/09/2019 1452   AST 30 06/09/2019 1452   AST 26 06/01/2019 1047   ALT 21 06/09/2019 1452   ALT 17 06/01/2019 1047   BILITOT 2.2 (H) 06/09/2019 1452   BILITOT 1.2 06/01/2019 1047       RADIOGRAPHIC STUDIES: CT Abdomen Pelvis Wo Contrast  Result Date: 06/18/2019 CLINICAL DATA:  Restaging lung cancer EXAM: CT CHEST, ABDOMEN AND PELVIS WITHOUT CONTRAST TECHNIQUE: Multidetector CT imaging of the chest, abdomen and pelvis was performed following the standard protocol without IV contrast. COMPARISON:  04/17/2019 FINDINGS: CT CHEST FINDINGS Cardiovascular: RIGHT-sided Port-A-Cath terminating at the caval to atrial junction. Calcified atheromatous plaque throughout the thoracic aorta without aneurysmal dilation. Small pericardial effusion. Similar to prior study. Central pulmonary vasculature is of normal caliber. Mediastinum/Nodes: Enlarging mediastinal lymph nodes, pre-vascular and high AP window lymph nodes (image 28, series 2) 2.0 cm, previously less than a cm. (Image 30): 1.4 cm lymph node previously approximately 1 cm no thoracic inlet adenopathy. No axillary lymphadenopathy. No subcarinal or gross hilar adenopathy. Lungs/Pleura: RIGHT apical nodularity along the posterior surface of the pleura in the RIGHT upper lobe is stable approximately 16 mm (image 38, series 4) Since the prior study there is been interval increase in size of small pulmonary nodules throughout the LEFT and RIGHT chest for instance, on image 83 of series 4 a nearly 6 mm nodule previously measured approximately 4 mm. RIGHT middle lobe nodule (image 113 of  series 3 6 mm along the anterior aspect of the RIGHT middle lobe previously 4-5 mm. These changes are subtle. These nodules are innumerable. Post treatment changes in the LEFT chest arising from LEFT hilum with pleural and parenchymal distortion are associated with increasing consolidation and soft tissue. A discrete nodule in the LEFT upper lobe on image 81 of series 3 measuring 6 mm was previously 3-4 mm on the prior exam. No pleural effusion. Extensive pulmonary emphysema. Musculoskeletal: No sign of chest wall mass. See below for full musculoskeletal details. CT ABDOMEN PELVIS FINDINGS Hepatobiliary: Liver is unremarkable on noncontrast imaging. Gallbladder without pericholecystic stranding. No biliary ductal dilation grossly on noncontrast imaging. Pancreas: Pancreas is normal without focal lesion. Spleen: Spleen normal size without focal lesion.  Adrenals/Urinary Tract: Bilateral adrenal masses 3.8 x 3.0 cm on the LEFT approximately 3.1 x 2.7 cm previously. RIGHT adrenal gland measuring 3.4 by 3.1 cm in greatest axial dimension previously 3.3 x 2.5 cm. The superior aspect of the infiltrative process in the adrenal gland on image 66 of series 2 has increased in thickness as well measuring approximately 15 mm greatest thickness as compared to 10 mm. No sign of hydronephrosis. Urinary bladder is trabeculated. Stomach/Bowel: No acute bowel process. Vascular/Lymphatic: The infrarenal abdominal aortic aneurysm shows a similar appearance measuring 4.6 cm greatest axial dimension. Also with dilated LEFT common iliac artery with similar appearance. No adenopathy in the retroperitoneum. No adenopathy in the pelvis. Reproductive: Prostate mildly enlarged is similar to the prior study. Other: No sign of hernia or ascites. Musculoskeletal: Large LEFT acetabular metastasis extending into the ischium measuring approximately 3.4 x 2.9 cm greatest axial dimension previously approximately 3.1 cm, violating the medial cortex  extending towards the obturator muscle on today's study. Some sclerosis partially visualized in the cervical spine Sclerosis of the T9 and T11 vertebral bodies with extension into posterior elements at T9. On sagittal reconstructions sclerotic areas in the cervical spine appear more pronounced and at multiple levels though there is degenerative change in the cervical spine. L2 sclerosis more conspicuous than on previous imaging. Marked anterolisthesis of L5 on S1 with pars defects and adjacent degenerative changes similar to the previous study. IMPRESSION: 1. Enlarging mediastinal lymph nodes, consistent with worsening metastatic disease. 2. Small nodules scattered throughout the chest are slightly increased in size and number compatible with worsening of metastatic disease in the chest and associated with increasing soft tissue about the LEFT hilum some of which may be related to post treatment change but much of which is suspicious for disease particularly on image 32 of series 2 where lobulated margins along the medial aspect of consolidative change at the peripheral LEFT chest are noted. 3. Enlarging adrenal masses, consistent with worsening metastatic disease. 4. Multifocal areas of suspicion in the spine as well and not mentioned above subtle sclerosis in the LEFT humeral head all concerning for additional developing sites of disease. 5. Stable 4.4 cm infrarenal abdominal aortic aneurysm. 6. Emphysema and aortic atherosclerosis. 7. Note that LEFT acetabular lesion is at greater risk for pathologic fracture due to cortical disruption along the medial margin. These results will be called to the ordering clinician or representative by the Radiologist Assistant, and communication documented in the PACS or [employer]. Aortic Atherosclerosis (ICD10-I70.0) and Emphysema (ICD10-J43.9). Electronically Signed   By: Zetta Bills M.D.   On: 06/18/2019 17:57   CT Head Wo Contrast  Result Date:  06/09/2019 CLINICAL DATA:  Dizziness, ataxia. EXAM: CT HEAD WITHOUT CONTRAST TECHNIQUE: Contiguous axial images were obtained from the base of the skull through the vertex without intravenous contrast. COMPARISON:  None. FINDINGS: Brain: Mild chronic ischemic white matter disease is noted. No mass effect or midline shift is noted. Ventricular size is within normal limits. There is no evidence of mass lesion, hemorrhage or acute infarction. Vascular: No hyperdense vessel or unexpected calcification. Skull: Normal. Negative for fracture or focal lesion. Sinuses/Orbits: No acute finding. Other: Fluid is noted in right mastoid air cells. IMPRESSION: Mild chronic ischemic white matter disease. No acute intracranial abnormality seen. Electronically Signed   By: Marijo Conception M.D.   On: 06/09/2019 13:55   CT Chest Wo Contrast  Result Date: 06/18/2019 CLINICAL DATA:  Restaging lung cancer EXAM: CT CHEST, ABDOMEN AND PELVIS  WITHOUT CONTRAST TECHNIQUE: Multidetector CT imaging of the chest, abdomen and pelvis was performed following the standard protocol without IV contrast. COMPARISON:  04/17/2019 FINDINGS: CT CHEST FINDINGS Cardiovascular: RIGHT-sided Port-A-Cath terminating at the caval to atrial junction. Calcified atheromatous plaque throughout the thoracic aorta without aneurysmal dilation. Small pericardial effusion. Similar to prior study. Central pulmonary vasculature is of normal caliber. Mediastinum/Nodes: Enlarging mediastinal lymph nodes, pre-vascular and high AP window lymph nodes (image 28, series 2) 2.0 cm, previously less than a cm. (Image 30): 1.4 cm lymph node previously approximately 1 cm no thoracic inlet adenopathy. No axillary lymphadenopathy. No subcarinal or gross hilar adenopathy. Lungs/Pleura: RIGHT apical nodularity along the posterior surface of the pleura in the RIGHT upper lobe is stable approximately 16 mm (image 38, series 4) Since the prior study there is been interval increase in size  of small pulmonary nodules throughout the LEFT and RIGHT chest for instance, on image 83 of series 4 a nearly 6 mm nodule previously measured approximately 4 mm. RIGHT middle lobe nodule (image 113 of series 3 6 mm along the anterior aspect of the RIGHT middle lobe previously 4-5 mm. These changes are subtle. These nodules are innumerable. Post treatment changes in the LEFT chest arising from LEFT hilum with pleural and parenchymal distortion are associated with increasing consolidation and soft tissue. A discrete nodule in the LEFT upper lobe on image 81 of series 3 measuring 6 mm was previously 3-4 mm on the prior exam. No pleural effusion. Extensive pulmonary emphysema. Musculoskeletal: No sign of chest wall mass. See below for full musculoskeletal details. CT ABDOMEN PELVIS FINDINGS Hepatobiliary: Liver is unremarkable on noncontrast imaging. Gallbladder without pericholecystic stranding. No biliary ductal dilation grossly on noncontrast imaging. Pancreas: Pancreas is normal without focal lesion. Spleen: Spleen normal size without focal lesion. Adrenals/Urinary Tract: Bilateral adrenal masses 3.8 x 3.0 cm on the LEFT approximately 3.1 x 2.7 cm previously. RIGHT adrenal gland measuring 3.4 by 3.1 cm in greatest axial dimension previously 3.3 x 2.5 cm. The superior aspect of the infiltrative process in the adrenal gland on image 66 of series 2 has increased in thickness as well measuring approximately 15 mm greatest thickness as compared to 10 mm. No sign of hydronephrosis. Urinary bladder is trabeculated. Stomach/Bowel: No acute bowel process. Vascular/Lymphatic: The infrarenal abdominal aortic aneurysm shows a similar appearance measuring 4.6 cm greatest axial dimension. Also with dilated LEFT common iliac artery with similar appearance. No adenopathy in the retroperitoneum. No adenopathy in the pelvis. Reproductive: Prostate mildly enlarged is similar to the prior study. Other: No sign of hernia or ascites.  Musculoskeletal: Large LEFT acetabular metastasis extending into the ischium measuring approximately 3.4 x 2.9 cm greatest axial dimension previously approximately 3.1 cm, violating the medial cortex extending towards the obturator muscle on today's study. Some sclerosis partially visualized in the cervical spine Sclerosis of the T9 and T11 vertebral bodies with extension into posterior elements at T9. On sagittal reconstructions sclerotic areas in the cervical spine appear more pronounced and at multiple levels though there is degenerative change in the cervical spine. L2 sclerosis more conspicuous than on previous imaging. Marked anterolisthesis of L5 on S1 with pars defects and adjacent degenerative changes similar to the previous study. IMPRESSION: 1. Enlarging mediastinal lymph nodes, consistent with worsening metastatic disease. 2. Small nodules scattered throughout the chest are slightly increased in size and number compatible with worsening of metastatic disease in the chest and associated with increasing soft tissue about the LEFT hilum some of which  may be related to post treatment change but much of which is suspicious for disease particularly on image 32 of series 2 where lobulated margins along the medial aspect of consolidative change at the peripheral LEFT chest are noted. 3. Enlarging adrenal masses, consistent with worsening metastatic disease. 4. Multifocal areas of suspicion in the spine as well and not mentioned above subtle sclerosis in the LEFT humeral head all concerning for additional developing sites of disease. 5. Stable 4.4 cm infrarenal abdominal aortic aneurysm. 6. Emphysema and aortic atherosclerosis. 7. Note that LEFT acetabular lesion is at greater risk for pathologic fracture due to cortical disruption along the medial margin. These results will be called to the ordering clinician or representative by the Radiologist Assistant, and communication documented in the PACS or [employer]. Aortic Atherosclerosis (ICD10-I70.0) and Emphysema (ICD10-J43.9). Electronically Signed   By: Zetta Bills M.D.   On: 06/18/2019 17:57    ASSESSMENT AND PLAN: This is a very pleasant 74 years old white male diagnosed with metastatic non-small cell lung cancer that was initially diagnosed as unresectable stage IIb non-small cell lung cancer, adenocarcinoma presented with right upper lobe lung nodule in addition to right hilar lymphadenopathy diagnosed in February 2020.  The patient is now a good candidate for surgical resection. The patient underwent a course of concurrent chemoradiation with weekly carboplatin and paclitaxel status post 7 cycles completed in May 2020. The patient is currently on observation and he is feeling fine. He had repeat CT scan of the chest, abdomen pelvis performed recently.  I personally and independently reviewed the scan images and discussed the results with the patient today. Unfortunately his a scan showed further progression of the bilateral adrenal lesions suspicious for metastasis. This finding were confirmed by a PET scan. The patient underwent CT-guided core biopsy of the left adrenal gland lesion and the final pathology was consistent with metastatic adenocarcinoma of the lung. The patient is currently undergoing systemic chemotherapy with carboplatin for AUC of 5, Alimta 500 mg/M2 and Keytruda 200 mg IV every 3 weeks.  He is status post 6 cycles.  Starting from cycle #5 the patient has been on treatment with maintenance Alimta and Keytruda every 3 weeks. The patient has a rough time with this treatment with pancytopenia requiring PRBCs transfusion.  He also lost a lot of weight. He had repeat CT scan of the chest, abdomen pelvis performed recently.  I personally and independently reviewed the scan images and discussed the results with the patient and his wife today. His scan showed disease progression in the lung, mediastinal lymph node as well as the  adrenal glands and bone metastasis including impending compression fracture of the left acetabulum. I had a lengthy discussion with the patient and his wife about his current condition and treatment options.  I strongly recommend for the patient to consider palliative care and hospice at this point.  I do not think his performance status and general condition are good enough to proceed with additional systemic chemotherapy.  He would like to take sometimes to think about his option including second line systemic chemotherapy or proceeding with the palliative care and hospice. In the meantime I will refer the patient to radiation oncology as well as orthopedic surgery to evaluate his impending acetabular fracture. For pain management I will start the patient on Percocet as needed for pain. For the lack of appetite I will start him on a Medrol Dosepak.  His allergy to steroid is mainly redness of his face.  The patient was advised to stop the treatment immediately if he has any concerning allergic reaction. We will wait to hear from the patient regarding his decision. The patient was advised to call immediately if he has any concerning symptoms in the interval.  The patient voices understanding of current disease status and treatment options and is in agreement with the current care plan. All questions were answered. The patient knows to call the clinic with any problems, questions or concerns. We can certainly see the patient much sooner if necessary.  Disclaimer: This note was dictated with voice recognition software. Similar sounding words can inadvertently be transcribed and may not be corrected upon review.

## 2019-06-23 ENCOUNTER — Inpatient Hospital Stay: Payer: Medicare Other

## 2019-06-23 ENCOUNTER — Other Ambulatory Visit: Payer: Self-pay

## 2019-06-23 ENCOUNTER — Telehealth: Payer: Self-pay | Admitting: Medical Oncology

## 2019-06-23 DIAGNOSIS — D61818 Other pancytopenia: Secondary | ICD-10-CM | POA: Diagnosis not present

## 2019-06-23 DIAGNOSIS — R630 Anorexia: Secondary | ICD-10-CM | POA: Diagnosis not present

## 2019-06-23 DIAGNOSIS — M25552 Pain in left hip: Secondary | ICD-10-CM | POA: Diagnosis not present

## 2019-06-23 DIAGNOSIS — C3411 Malignant neoplasm of upper lobe, right bronchus or lung: Secondary | ICD-10-CM | POA: Diagnosis not present

## 2019-06-23 DIAGNOSIS — I739 Peripheral vascular disease, unspecified: Secondary | ICD-10-CM | POA: Diagnosis not present

## 2019-06-23 DIAGNOSIS — C7951 Secondary malignant neoplasm of bone: Secondary | ICD-10-CM | POA: Diagnosis not present

## 2019-06-23 DIAGNOSIS — I9589 Other hypotension: Secondary | ICD-10-CM

## 2019-06-23 MED ORDER — SODIUM CHLORIDE 0.9% FLUSH
10.0000 mL | Freq: Once | INTRAVENOUS | Status: AC | PRN
  Administered 2019-06-23: 10 mL
  Filled 2019-06-23: qty 10

## 2019-06-23 MED ORDER — HEPARIN SOD (PORK) LOCK FLUSH 100 UNIT/ML IV SOLN
250.0000 [IU] | Freq: Once | INTRAVENOUS | Status: AC | PRN
  Administered 2019-06-23: 250 [IU]
  Filled 2019-06-23: qty 5

## 2019-06-23 NOTE — Telephone Encounter (Signed)
Port needle left accessed yesterday. Schedule message sent

## 2019-06-24 ENCOUNTER — Telehealth: Payer: Self-pay | Admitting: Medical Oncology

## 2019-06-24 ENCOUNTER — Other Ambulatory Visit: Payer: Self-pay | Admitting: Internal Medicine

## 2019-06-24 DIAGNOSIS — C3492 Malignant neoplasm of unspecified part of left bronchus or lung: Secondary | ICD-10-CM

## 2019-06-24 NOTE — Telephone Encounter (Addendum)
"  BP is low today 113/63". Poor intake, amber urine, in bed all day He is taking sips of water and he "will eat yogurt in a little while".  Occasional light headed when he trys to get up. He uses a urinal . -denies dysuria. LBM 5 days ago.

## 2019-06-24 NOTE — Telephone Encounter (Signed)
Symptom management or ED.

## 2019-06-24 NOTE — Telephone Encounter (Signed)
Radiation referral - when is appt with Dr Lisbeth Renshaw?

## 2019-06-25 ENCOUNTER — Inpatient Hospital Stay (HOSPITAL_BASED_OUTPATIENT_CLINIC_OR_DEPARTMENT_OTHER): Payer: Medicare Other | Admitting: Medical

## 2019-06-25 ENCOUNTER — Ambulatory Visit
Admission: RE | Admit: 2019-06-25 | Discharge: 2019-06-25 | Disposition: A | Discharge status: Home / Self Care | Payer: Medicare Other | Source: Ambulatory Visit | Attending: Radiation Oncology | Admitting: Radiation Oncology

## 2019-06-25 ENCOUNTER — Other Ambulatory Visit: Payer: Self-pay

## 2019-06-25 ENCOUNTER — Telehealth: Payer: Self-pay | Admitting: *Deleted

## 2019-06-25 ENCOUNTER — Inpatient Hospital Stay: Payer: Medicare Other

## 2019-06-25 ENCOUNTER — Telehealth: Payer: Self-pay

## 2019-06-25 VITALS — BP 101/81 | HR 112 | Temp 97.7°F | Resp 20

## 2019-06-25 DIAGNOSIS — I10 Essential (primary) hypertension: Secondary | ICD-10-CM | POA: Diagnosis not present

## 2019-06-25 DIAGNOSIS — I2510 Atherosclerotic heart disease of native coronary artery without angina pectoris: Secondary | ICD-10-CM | POA: Diagnosis not present

## 2019-06-25 DIAGNOSIS — R785 Finding of other psychotropic drug in blood: Secondary | ICD-10-CM | POA: Insufficient documentation

## 2019-06-25 DIAGNOSIS — K219 Gastro-esophageal reflux disease without esophagitis: Secondary | ICD-10-CM | POA: Diagnosis not present

## 2019-06-25 DIAGNOSIS — C7972 Secondary malignant neoplasm of left adrenal gland: Secondary | ICD-10-CM | POA: Insufficient documentation

## 2019-06-25 DIAGNOSIS — Z510 Encounter for antineoplastic radiation therapy: Secondary | ICD-10-CM | POA: Diagnosis not present

## 2019-06-25 DIAGNOSIS — I9589 Other hypotension: Secondary | ICD-10-CM | POA: Diagnosis not present

## 2019-06-25 DIAGNOSIS — E876 Hypokalemia: Secondary | ICD-10-CM

## 2019-06-25 DIAGNOSIS — D61818 Other pancytopenia: Secondary | ICD-10-CM | POA: Diagnosis not present

## 2019-06-25 DIAGNOSIS — J439 Emphysema, unspecified: Secondary | ICD-10-CM | POA: Insufficient documentation

## 2019-06-25 DIAGNOSIS — D649 Anemia, unspecified: Secondary | ICD-10-CM

## 2019-06-25 DIAGNOSIS — C7971 Secondary malignant neoplasm of right adrenal gland: Secondary | ICD-10-CM | POA: Diagnosis not present

## 2019-06-25 DIAGNOSIS — G893 Neoplasm related pain (acute) (chronic): Secondary | ICD-10-CM | POA: Insufficient documentation

## 2019-06-25 DIAGNOSIS — Z87891 Personal history of nicotine dependence: Secondary | ICD-10-CM | POA: Diagnosis not present

## 2019-06-25 DIAGNOSIS — Z923 Personal history of irradiation: Secondary | ICD-10-CM | POA: Diagnosis not present

## 2019-06-25 DIAGNOSIS — C3411 Malignant neoplasm of upper lobe, right bronchus or lung: Secondary | ICD-10-CM | POA: Diagnosis not present

## 2019-06-25 DIAGNOSIS — N400 Enlarged prostate without lower urinary tract symptoms: Secondary | ICD-10-CM | POA: Diagnosis not present

## 2019-06-25 DIAGNOSIS — C3412 Malignant neoplasm of upper lobe, left bronchus or lung: Secondary | ICD-10-CM | POA: Insufficient documentation

## 2019-06-25 DIAGNOSIS — C7951 Secondary malignant neoplasm of bone: Secondary | ICD-10-CM

## 2019-06-25 DIAGNOSIS — I739 Peripheral vascular disease, unspecified: Secondary | ICD-10-CM | POA: Insufficient documentation

## 2019-06-25 DIAGNOSIS — I714 Abdominal aortic aneurysm, without rupture: Secondary | ICD-10-CM | POA: Diagnosis not present

## 2019-06-25 DIAGNOSIS — Z79899 Other long term (current) drug therapy: Secondary | ICD-10-CM | POA: Diagnosis not present

## 2019-06-25 DIAGNOSIS — C3492 Malignant neoplasm of unspecified part of left bronchus or lung: Secondary | ICD-10-CM

## 2019-06-25 DIAGNOSIS — R112 Nausea with vomiting, unspecified: Secondary | ICD-10-CM

## 2019-06-25 DIAGNOSIS — E861 Hypovolemia: Secondary | ICD-10-CM | POA: Diagnosis not present

## 2019-06-25 DIAGNOSIS — F419 Anxiety disorder, unspecified: Secondary | ICD-10-CM | POA: Insufficient documentation

## 2019-06-25 DIAGNOSIS — R630 Anorexia: Secondary | ICD-10-CM | POA: Diagnosis not present

## 2019-06-25 DIAGNOSIS — M25552 Pain in left hip: Secondary | ICD-10-CM | POA: Diagnosis not present

## 2019-06-25 LAB — CMP (CANCER CENTER ONLY)
ALT: 9 U/L (ref 0–44)
AST: 42 U/L — ABNORMAL HIGH (ref 15–41)
Albumin: 3.1 g/dL — ABNORMAL LOW (ref 3.5–5.0)
Alkaline Phosphatase: 160 U/L — ABNORMAL HIGH (ref 38–126)
Anion gap: 16 — ABNORMAL HIGH (ref 5–15)
BUN: 16 mg/dL (ref 8–23)
CO2: 25 mmol/L (ref 22–32)
Calcium: 9.3 mg/dL (ref 8.9–10.3)
Chloride: 93 mmol/L — ABNORMAL LOW (ref 98–111)
Creatinine: 0.78 mg/dL (ref 0.61–1.24)
GFR, Est AFR Am: >60 mL/min (ref >60)
GFR, Estimated: >60 mL/min (ref >60)
Glucose, Bld: 91 mg/dL (ref 70–99)
Potassium: 3.2 mmol/L — ABNORMAL LOW (ref 3.5–5.1)
Sodium: 134 mmol/L — ABNORMAL LOW (ref 135–145)
Total Bilirubin: 1.1 mg/dL (ref 0.3–1.2)
Total Protein: 7.2 g/dL (ref 6.5–8.1)

## 2019-06-25 LAB — CBC WITH DIFFERENTIAL (CANCER CENTER ONLY)
Abs Immature Granulocytes: 0.06 10*3/uL (ref 0.00–0.07)
Basophils Absolute: 0 10*3/uL (ref 0.0–0.1)
Basophils Relative: 0 %
Eosinophils Absolute: 0.1 10*3/uL (ref 0.0–0.5)
Eosinophils Relative: 1 %
HCT: 27.6 % — ABNORMAL LOW (ref 39.0–52.0)
Hemoglobin: 8.8 g/dL — ABNORMAL LOW (ref 13.0–17.0)
Immature Granulocytes: 1 %
Lymphocytes Relative: 9 %
Lymphs Abs: 0.6 10*3/uL — ABNORMAL LOW (ref 0.7–4.0)
MCH: 29.6 pg (ref 26.0–34.0)
MCHC: 31.9 g/dL (ref 30.0–36.0)
MCV: 92.9 fL (ref 80.0–100.0)
Monocytes Absolute: 0.9 10*3/uL (ref 0.1–1.0)
Monocytes Relative: 13 %
Neutro Abs: 5.5 10*3/uL (ref 1.7–7.7)
Neutrophils Relative %: 76 %
Platelet Count: 180 10*3/uL (ref 150–400)
RBC: 2.97 MIL/uL — ABNORMAL LOW (ref 4.22–5.81)
RDW: 18.8 % — ABNORMAL HIGH (ref 11.5–15.5)
WBC Count: 7.2 10*3/uL (ref 4.0–10.5)
nRBC: 0 % (ref 0.0–0.2)

## 2019-06-25 LAB — MAGNESIUM: Magnesium: 1.4 mg/dL — CL (ref 1.7–2.4)

## 2019-06-25 MED ORDER — POTASSIUM CHLORIDE CRYS ER 20 MEQ PO TBCR
40.0000 meq | EXTENDED_RELEASE_TABLET | Freq: Once | ORAL | Status: AC
  Administered 2019-06-25: 40 meq via ORAL

## 2019-06-25 MED ORDER — POTASSIUM CHLORIDE CRYS ER 20 MEQ PO TBCR
EXTENDED_RELEASE_TABLET | ORAL | Status: AC
  Filled 2019-06-25: qty 2

## 2019-06-25 MED ORDER — ONDANSETRON HCL 4 MG/2ML IJ SOLN
INTRAMUSCULAR | Status: AC
  Filled 2019-06-25: qty 4

## 2019-06-25 MED ORDER — OXYCODONE-ACETAMINOPHEN 5-325 MG PO TABS
1.0000 | ORAL_TABLET | Freq: Once | ORAL | Status: AC
  Administered 2019-06-25: 1 via ORAL
  Filled 2019-06-25: qty 1

## 2019-06-25 MED ORDER — SODIUM CHLORIDE 0.9% FLUSH
10.0000 mL | Freq: Once | INTRAVENOUS | Status: AC | PRN
  Administered 2019-06-25: 10 mL
  Filled 2019-06-25: qty 10

## 2019-06-25 MED ORDER — HEPARIN SOD (PORK) LOCK FLUSH 100 UNIT/ML IV SOLN
500.0000 [IU] | Freq: Once | INTRAVENOUS | Status: AC
  Administered 2019-06-25: 500 [IU] via INTRAVENOUS
  Filled 2019-06-25: qty 5

## 2019-06-25 MED ORDER — SODIUM CHLORIDE 0.9 % IV SOLN
INTRAVENOUS | Status: DC
  Filled 2019-06-25 (×2): qty 1000

## 2019-06-25 MED ORDER — HEPARIN SOD (PORK) LOCK FLUSH 100 UNIT/ML IV SOLN
500.0000 [IU] | Freq: Once | INTRAVENOUS | Status: AC | PRN
  Administered 2019-06-25: 500 [IU]
  Filled 2019-06-25: qty 5

## 2019-06-25 MED ORDER — ONDANSETRON HCL 4 MG/2ML IJ SOLN
8.0000 mg | Freq: Once | INTRAMUSCULAR | Status: AC
  Administered 2019-06-25: 8 mg via INTRAVENOUS

## 2019-06-25 MED ORDER — SODIUM CHLORIDE 0.9% FLUSH
10.0000 mL | INTRAVENOUS | Status: DC | PRN
  Filled 2019-06-25: qty 10

## 2019-06-25 MED ORDER — MAGNESIUM SULFATE 4 GM/100ML IV SOLN: 4.0000 g | Freq: Once | INTRAVENOUS | Status: DC

## 2019-06-25 NOTE — Progress Notes (Addendum)
Radiation Oncology         [phone] ________________________________  Name: Seth [name]        MRN: [id number]  Date of Service: 06/25/2019 DOB: [birth year]  [place]:[id number], [name], MD  [name], MD     REFERRING PHYSICIAN: [name], MD   DIAGNOSIS: The primary encounter diagnosis was Adenocarcinoma of left lung, stage 2 ([place]). Diagnoses of Bone metastases ([place]) and Pain from bone metastases ([place]) were also pertinent to this visit.   HISTORY OF PRESENT ILLNESS: Seth [name] is a 74 y.o. male with a history of NSCLC, of the left hilum. The patient was seen due to hemoptysis and a CXR on 12/24/17 revealed assymmetric opacity in the right lung. A CT chest without contrast on 02/14/2018 revealed a 21 x 21 mm mass in the central lingula in the left chest and a 1 cm left hilar lymph node was noted no other abnormal nodes were identified. He underwent bronchoscopy on 02/26/2018 which revealed benign features. PET imaging on 03/03/2018 revealed hypermetabolic changes in the lingular lesion with an SUV of 7.9 and the left hilar node measured 1.1 cm and had an SUV of 6.8. His case was discussed in conference and recommendations were for repeat bronchoscopy. He underwent this procedure on 03/19/2018 which revealed NSCLC in the 11L node. He met with Dr. Servando Snare and is not a surgical candidate.  Rather, the patient went on to proceed with chemoradiation which he completed in May 2020.  He did have esophagitis during treatment but otherwise did well.  Since that time he has been under close follow-up with Dr. Julien Nordmann, and was found to have progressive disease in his adrenal glands bilaterally.  PET scan confirmed this suspicion as did a core biopsy of the left adrenal gland in December 2020.  He began systemic carboplatin Alimta and Keytruda on 02/18/2019, and completed 6 cycles of this on 06/01/2019.  Recent imaging for restaging on 06/18/2019 revealed enlarging disease in the mediastinum, increase  in scattered nodules throughout the chest with the majority of the worsening along the left hilum, enlarging adrenal metastases bilaterally, and multifocal areas of concern within the spine, left humerus along the head, and left acetabulum.  He met with Dr. Ivin Poot earlier this week to discuss options of further palliative systemic therapy though at this point this did not seem to be a good option versus hospice.  With the patient's possible impending compromise to the left acetabulum, he is seen today to discuss options of palliative radiotherapy.    PREVIOUS RADIATION THERAPY:   05/05/2018-06/18/2018:  The patient received 60 Gy in 30 fractions to the left hilar tumor, along with a 6 Gy boost in 3 fractions.    PAST MEDICAL HISTORY:  Past Medical History:  Diagnosis Date  . AAA (abdominal aortic aneurysm) ([place])   . Allergic rhinitis   . Anxiety   . Back pain   . CAD (coronary artery disease)   . Cataracts, bilateral   . Emphysema, unspecified ([place])     " moderate"  . Essential hypertension, benign   . GERD (gastroesophageal reflux disease)   . Gout   . Headache    migraines  . Hilar adenopathy   . History of hiatal hernia   . History of sciatica   . Hyperlipidemia   . lung ca dx'd 02/2018  . Lung nodule   . Pneumonia    as child  . Prostate hypertrophy   . PVD (peripheral vascular disease) (  [facility])   . Seasonal allergies   . Subclavian steal syndrome of left subclavian artery   . Wears dentures        PAST SURGICAL HISTORY: Past Surgical History:  Procedure Laterality Date  . CARDIAC CATHETERIZATION     stent placement  . COLONOSCOPY W/ BIOPSIES AND POLYPECTOMY    . ILIAC ARTERY STENT    . IR IMAGING GUIDED PORT INSERTION  03/23/2019  . MULTIPLE TOOTH EXTRACTIONS    . VIDEO BRONCHOSCOPY WITH ENDOBRONCHIAL NAVIGATION N/A 02/26/2018   Procedure: VIDEO BRONCHOSCOPY WITH ENDOBRONCHIAL NAVIGATION;  Surgeon: Garner Nash, DO;  Location: [facility];  Service: Thoracic;   Laterality: N/A;  . VIDEO BRONCHOSCOPY WITH ENDOBRONCHIAL NAVIGATION N/A 03/19/2018   Procedure: VIDEO BRONCHOSCOPY WITH ENDOBRONCHIAL NAVIGATION and endobronchial ultrasound;  Surgeon: Garner Nash, DO;  Location: [facility];  Service: Thoracic;  Laterality: N/A;  . VIDEO BRONCHOSCOPY WITH ENDOBRONCHIAL ULTRASOUND N/A 02/26/2018   Procedure: VIDEO BRONCHOSCOPY WITH ENDOBRONCHIAL ULTRASOUND;  Surgeon: Garner Nash, DO;  Location: [facility];  Service: Thoracic;  Laterality: N/A;  . VIDEO BRONCHOSCOPY WITH ENDOBRONCHIAL ULTRASOUND N/A 03/19/2018   Procedure: NAVIGATION BRONCHOSCOPY;  Surgeon: Garner Nash, DO;  Location: [facility];  Service: Thoracic;  Laterality: N/A;     FAMILY HISTORY:  Family History  Problem Relation Age of Onset  . Hypertension Mother   . Heart disease Mother   . Stroke Mother   . Heart disease Paternal Grandmother   . Diabetes Paternal Grandmother   . Hyperlipidemia Paternal Grandmother   . Diabetes Sister   . Heart disease Sister   . Hyperlipidemia Sister   . Heart disease Brother   . Hyperlipidemia Brother   . Hypertension Brother   . Diabetes Paternal Grandfather   . Heart disease Paternal Grandfather   . Hyperlipidemia Paternal Grandfather   . Heart disease Maternal Uncle        x 2  . Cancer Maternal Grandmother        type unknown, ? lung  . Leukemia Maternal Uncle      SOCIAL HISTORY:  reports that he quit smoking about 3 years ago. His smoking use included cigarettes. He has a 150.00 pack-year smoking history. He has never used smokeless tobacco. He reports current alcohol use. He reports that he does not use drugs. The patient is married and lives in [place]. 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    ALLERGIES: Biaxin [clarithromycin], Clarithromycin, Iodinated diagnostic agents, Prednisone, Cortisone, Flomax [tamsulosin hcl], Hepatitis b virus vaccines, and Iodine   MEDICATIONS:  Current Outpatient Medications    Medication Sig Dispense Refill  . acetaminophen (TYLENOL) 325 MG tablet Take 650 mg by mouth daily as needed for mild pain.    Marland Kitchen atorvastatin (LIPITOR) 40 MG tablet TAKE 1 TABLET BY MOUTH EVERY DAY (Patient taking differently: Take 40 mg by mouth daily. ) 90 tablet 3  . cetirizine (ZYRTEC) 10 MG tablet Take 10 mg by mouth daily as needed for allergies.     Marland Kitchen clopidogrel (PLAVIX) 75 MG tablet TAKE 1 TABLET BY MOUTH EVERY DAY (Patient taking differently: Take 75 mg by mouth daily. ) 90 tablet 0  . famotidine (PEPCID) 20 MG tablet Take 20 mg by mouth 2 (two) times daily.     . folic acid (FOLVITE) 1 MG tablet TAKE 1 TABLET BY MOUTH EVERY DAY (Patient taking differently: Take 1 mg by mouth daily. ) 90 tablet 1  . magnesium oxide (MAG-OX) 400 (241.3  Mg) MG tablet Take 1 tablet (400 mg total) by mouth 2 (two) times daily. (Patient not taking: Reported on 06/09/2019) 60 tablet 0  . methylPREDNISolone (MEDROL DOSEPAK) 4 MG TBPK tablet Use as instructed.  Stop immediately if you develop any allergic reaction. 21 tablet 0  . metoprolol tartrate (LOPRESSOR) 25 MG tablet Take 1 tablet (25 mg total) by mouth 2 (two) times daily. Only take if systolic blood pressure is >110 180 tablet 3  . oxyCODONE-acetaminophen (PERCOCET/ROXICET) 5-325 MG tablet Take 1 tablet by mouth every 8 (eight) hours as needed for severe pain. 30 tablet 0  . polyethylene glycol (MIRALAX) 17 g packet Take 17 g by mouth daily as needed for moderate constipation. 14 each 0  . Potassium Chloride ER 20 MEQ TBCR Take 20 mEq by mouth daily. 7 tablet 0  . prochlorperazine (COMPAZINE) 10 MG tablet Take 1 tablet (10 mg total) by mouth every 6 (six) hours as needed for nausea or vomiting. (Patient not taking: Reported on 06/09/2019) 30 tablet 0  . sodium chloride (OCEAN) 0.65 % SOLN nasal spray Place 1 spray into both nostrils 2 (two) times daily as needed for congestion.     No current facility-administered medications for this encounter.    Facility-Administered Medications Ordered in Other Encounters  Medication Dose Route Frequency Provider Last Rate Last Admin  . heparin lock flush 100 unit/mL  500 Units Intravenous Once Curt Bears, MD      . sodium chloride 0.9 % 1,000 mL with magnesium sulfate 4 g infusion   Intravenous Continuous Harle Stanford., PA-C   Stopped at 06/25/19 1746  . sodium chloride flush (NS) 0.9 % injection 10 mL  10 mL Intravenous PRN Curt Bears, MD   10 mL at 04/21/19 1306  . sodium chloride flush (NS) 0.9 % injection 10 mL  10 mL Intravenous PRN Sandi Mealy E., PA-C      . sodium chloride flush (NS) 0.9 % injection 3 mL  3 mL Intracatheter PRN Curt Bears, MD         REVIEW OF SYSTEMS: On review of systems, the patient reports that he is really struggling with fatigue and pain in several sites. He sleeps about 10 hours during the daytime per his wife. He reports he is taking oxycodone every 8 hours, but that it doesn't seem to make his need to rest any more noticeable but he does wake up in pain in the mornings. He reports the sites of pain are in his left hip region and that this has happened since the fall after he had a fall and broke his left ankle. He reports he also has pain in his left upper arm at the level of his shoulder region. He describes some pain as well in his upper chest along his right neck, extending down to the lateral chest wall. His painful sites are described as intermittent but deep aching. He denies neuropathic symptoms in these sites. No other complaints are noted.    PHYSICAL EXAM:  Wt Readings from Last 3 Encounters:  06/22/19 152 lb 4.8 oz (69.1 kg)  06/01/19 165 lb 11.2 oz (75.2 kg)  05/13/19 172 lb (78 kg)   Temp Readings from Last 3 Encounters:  06/25/19 97.7 F (36.5 C)  06/25/19 97.9 F (36.6 C) (Temporal)  06/22/19 98.7 F (37.1 C) (Temporal)   BP Readings from Last 3 Encounters:  06/25/19 101/81  06/25/19 110/70  06/22/19 128/74   Pulse  Readings from Last 3 Encounters:  06/25/19 (!) 112  06/25/19 (!) 106  06/22/19 (!) 115    In general this is a chronically ill appearing caucasian male in no acute distress. He's alert and oriented x4 and appropriate throughout the examination. Cardiopulmonary assessment is negative for acute distress and he exhibits normal effort.    ECOG = 3  0 - Asymptomatic (Fully active, able to carry on all predisease activities without restriction)  1 - Symptomatic but completely ambulatory (Restricted in physically strenuous activity but ambulatory and able to carry out work of a light or sedentary nature. For example, light housework, office work)  2 - Symptomatic, <50% in bed during the day (Ambulatory and capable of all self care but unable to carry out any work activities. Up and about more than 50% of waking hours)  3 - Symptomatic, >50% in bed, but not bedbound (Capable of only limited self-care, confined to bed or chair 50% or more of waking hours)  4 - Bedbound (Completely disabled. Cannot carry on any self-care. Totally confined to bed or chair)  5 - Death   Eustace Pen MM, Creech RH, Tormey DC, et al. [phone]). "Toxicity and response criteria of the [facility]". [place] Oncol. 5 (6): 649-55    LABORATORY DATA:  Lab Results  Component Value Date   WBC 7.2 06/25/2019   HGB 8.8 (L) 06/25/2019   HCT 27.6 (L) 06/25/2019   MCV 92.9 06/25/2019   PLT 180 06/25/2019   Lab Results  Component Value Date   NA 134 (L) 06/25/2019   K 3.2 (L) 06/25/2019   CL 93 (L) 06/25/2019   CO2 25 06/25/2019   Lab Results  Component Value Date   ALT 9 06/25/2019   AST 42 (H) 06/25/2019   ALKPHOS 160 (H) 06/25/2019   BILITOT 1.1 06/25/2019      RADIOGRAPHY: CT Abdomen Pelvis Wo Contrast  Result Date: 06/18/2019 CLINICAL DATA:  Restaging lung cancer EXAM: CT CHEST, ABDOMEN AND PELVIS WITHOUT CONTRAST TECHNIQUE: Multidetector CT imaging of the chest, abdomen and pelvis was  performed following the standard protocol without IV contrast. COMPARISON:  04/17/2019 FINDINGS: CT CHEST FINDINGS Cardiovascular: RIGHT-sided Port-A-Cath terminating at the caval to atrial junction. Calcified atheromatous plaque throughout the thoracic aorta without aneurysmal dilation. Small pericardial effusion. Similar to prior study. Central pulmonary vasculature is of normal caliber. Mediastinum/Nodes: Enlarging mediastinal lymph nodes, pre-vascular and high AP window lymph nodes (image 28, series 2) 2.0 cm, previously less than a cm. (Image 30): 1.4 cm lymph node previously approximately 1 cm no thoracic inlet adenopathy. No axillary lymphadenopathy. No subcarinal or gross hilar adenopathy. Lungs/Pleura: RIGHT apical nodularity along the posterior surface of the pleura in the RIGHT upper lobe is stable approximately 16 mm (image 38, series 4) Since the prior study there is been interval increase in size of small pulmonary nodules throughout the LEFT and RIGHT chest for instance, on image 83 of series 4 a nearly 6 mm nodule previously measured approximately 4 mm. RIGHT middle lobe nodule (image 113 of series 3 6 mm along the anterior aspect of the RIGHT middle lobe previously 4-5 mm. These changes are subtle. These nodules are innumerable. Post treatment changes in the LEFT chest arising from LEFT hilum with pleural and parenchymal distortion are associated with increasing consolidation and soft tissue. A discrete nodule in the LEFT upper lobe on image 81 of series 3 measuring 6 mm was previously 3-4 mm on the prior exam. No pleural effusion. Extensive pulmonary emphysema. Musculoskeletal: No  sign of chest wall mass. See below for full musculoskeletal details. CT ABDOMEN PELVIS FINDINGS Hepatobiliary: Liver is unremarkable on noncontrast imaging. Gallbladder without pericholecystic stranding. No biliary ductal dilation grossly on noncontrast imaging. Pancreas: Pancreas is normal without focal lesion. Spleen:  Spleen normal size without focal lesion. Adrenals/Urinary Tract: Bilateral adrenal masses 3.8 x 3.0 cm on the LEFT approximately 3.1 x 2.7 cm previously. RIGHT adrenal gland measuring 3.4 by 3.1 cm in greatest axial dimension previously 3.3 x 2.5 cm. The superior aspect of the infiltrative process in the adrenal gland on image 66 of series 2 has increased in thickness as well measuring approximately 15 mm greatest thickness as compared to 10 mm. No sign of hydronephrosis. Urinary bladder is trabeculated. Stomach/Bowel: No acute bowel process. Vascular/Lymphatic: The infrarenal abdominal aortic aneurysm shows a similar appearance measuring 4.6 cm greatest axial dimension. Also with dilated LEFT common iliac artery with similar appearance. No adenopathy in the retroperitoneum. No adenopathy in the pelvis. Reproductive: Prostate mildly enlarged is similar to the prior study. Other: No sign of hernia or ascites. Musculoskeletal: Large LEFT acetabular metastasis extending into the ischium measuring approximately 3.4 x 2.9 cm greatest axial dimension previously approximately 3.1 cm, violating the medial cortex extending towards the obturator muscle on today's study. Some sclerosis partially visualized in the cervical spine Sclerosis of the T9 and T11 vertebral bodies with extension into posterior elements at T9. On sagittal reconstructions sclerotic areas in the cervical spine appear more pronounced and at multiple levels though there is degenerative change in the cervical spine. L2 sclerosis more conspicuous than on previous imaging. Marked anterolisthesis of L5 on S1 with pars defects and adjacent degenerative changes similar to the previous study. IMPRESSION: 1. Enlarging mediastinal lymph nodes, consistent with worsening metastatic disease. 2. Small nodules scattered throughout the chest are slightly increased in size and number compatible with worsening of metastatic disease in the chest and associated with  increasing soft tissue about the LEFT hilum some of which may be related to post treatment change but much of which is suspicious for disease particularly on image 32 of series 2 where lobulated margins along the medial aspect of consolidative change at the peripheral LEFT chest are noted. 3. Enlarging adrenal masses, consistent with worsening metastatic disease. 4. Multifocal areas of suspicion in the spine as well and not mentioned above subtle sclerosis in the LEFT humeral head all concerning for additional developing sites of disease. 5. Stable 4.4 cm infrarenal abdominal aortic aneurysm. 6. Emphysema and aortic atherosclerosis. 7. Note that LEFT acetabular lesion is at greater risk for pathologic fracture due to cortical disruption along the medial margin. These results will be called to the ordering clinician or representative by the Radiologist Assistant, and communication documented in the PACS or [employer]. Aortic Atherosclerosis (ICD10-I70.0) and Emphysema (ICD10-J43.9). Electronically Signed   By: Zetta Bills M.D.   On: 06/18/2019 17:57   CT Head Wo Contrast  Result Date: 06/09/2019 CLINICAL DATA:  Dizziness, ataxia. EXAM: CT HEAD WITHOUT CONTRAST TECHNIQUE: Contiguous axial images were obtained from the base of the skull through the vertex without intravenous contrast. COMPARISON:  None. FINDINGS: Brain: Mild chronic ischemic white matter disease is noted. No mass effect or midline shift is noted. Ventricular size is within normal limits. There is no evidence of mass lesion, hemorrhage or acute infarction. Vascular: No hyperdense vessel or unexpected calcification. Skull: Normal. Negative for fracture or focal lesion. Sinuses/Orbits: No acute finding. Other: Fluid is noted in right mastoid air cells. IMPRESSION:  Mild chronic ischemic white matter disease. No acute intracranial abnormality seen. Electronically Signed   By: Marijo Conception M.D.   On: 06/09/2019 13:55   CT Chest Wo  Contrast  Result Date: 06/18/2019 CLINICAL DATA:  Restaging lung cancer EXAM: CT CHEST, ABDOMEN AND PELVIS WITHOUT CONTRAST TECHNIQUE: Multidetector CT imaging of the chest, abdomen and pelvis was performed following the standard protocol without IV contrast. COMPARISON:  04/17/2019 FINDINGS: CT CHEST FINDINGS Cardiovascular: RIGHT-sided Port-A-Cath terminating at the caval to atrial junction. Calcified atheromatous plaque throughout the thoracic aorta without aneurysmal dilation. Small pericardial effusion. Similar to prior study. Central pulmonary vasculature is of normal caliber. Mediastinum/Nodes: Enlarging mediastinal lymph nodes, pre-vascular and high AP window lymph nodes (image 28, series 2) 2.0 cm, previously less than a cm. (Image 30): 1.4 cm lymph node previously approximately 1 cm no thoracic inlet adenopathy. No axillary lymphadenopathy. No subcarinal or gross hilar adenopathy. Lungs/Pleura: RIGHT apical nodularity along the posterior surface of the pleura in the RIGHT upper lobe is stable approximately 16 mm (image 38, series 4) Since the prior study there is been interval increase in size of small pulmonary nodules throughout the LEFT and RIGHT chest for instance, on image 83 of series 4 a nearly 6 mm nodule previously measured approximately 4 mm. RIGHT middle lobe nodule (image 113 of series 3 6 mm along the anterior aspect of the RIGHT middle lobe previously 4-5 mm. These changes are subtle. These nodules are innumerable. Post treatment changes in the LEFT chest arising from LEFT hilum with pleural and parenchymal distortion are associated with increasing consolidation and soft tissue. A discrete nodule in the LEFT upper lobe on image 81 of series 3 measuring 6 mm was previously 3-4 mm on the prior exam. No pleural effusion. Extensive pulmonary emphysema. Musculoskeletal: No sign of chest wall mass. See below for full musculoskeletal details. CT ABDOMEN PELVIS FINDINGS Hepatobiliary: Liver is  unremarkable on noncontrast imaging. Gallbladder without pericholecystic stranding. No biliary ductal dilation grossly on noncontrast imaging. Pancreas: Pancreas is normal without focal lesion. Spleen: Spleen normal size without focal lesion. Adrenals/Urinary Tract: Bilateral adrenal masses 3.8 x 3.0 cm on the LEFT approximately 3.1 x 2.7 cm previously. RIGHT adrenal gland measuring 3.4 by 3.1 cm in greatest axial dimension previously 3.3 x 2.5 cm. The superior aspect of the infiltrative process in the adrenal gland on image 66 of series 2 has increased in thickness as well measuring approximately 15 mm greatest thickness as compared to 10 mm. No sign of hydronephrosis. Urinary bladder is trabeculated. Stomach/Bowel: No acute bowel process. Vascular/Lymphatic: The infrarenal abdominal aortic aneurysm shows a similar appearance measuring 4.6 cm greatest axial dimension. Also with dilated LEFT common iliac artery with similar appearance. No adenopathy in the retroperitoneum. No adenopathy in the pelvis. Reproductive: Prostate mildly enlarged is similar to the prior study. Other: No sign of hernia or ascites. Musculoskeletal: Large LEFT acetabular metastasis extending into the ischium measuring approximately 3.4 x 2.9 cm greatest axial dimension previously approximately 3.1 cm, violating the medial cortex extending towards the obturator muscle on today's study. Some sclerosis partially visualized in the cervical spine Sclerosis of the T9 and T11 vertebral bodies with extension into posterior elements at T9. On sagittal reconstructions sclerotic areas in the cervical spine appear more pronounced and at multiple levels though there is degenerative change in the cervical spine. L2 sclerosis more conspicuous than on previous imaging. Marked anterolisthesis of L5 on S1 with pars defects and adjacent degenerative changes similar to the previous  study. IMPRESSION: 1. Enlarging mediastinal lymph nodes, consistent with  worsening metastatic disease. 2. Small nodules scattered throughout the chest are slightly increased in size and number compatible with worsening of metastatic disease in the chest and associated with increasing soft tissue about the LEFT hilum some of which may be related to post treatment change but much of which is suspicious for disease particularly on image 32 of series 2 where lobulated margins along the medial aspect of consolidative change at the peripheral LEFT chest are noted. 3. Enlarging adrenal masses, consistent with worsening metastatic disease. 4. Multifocal areas of suspicion in the spine as well and not mentioned above subtle sclerosis in the LEFT humeral head all concerning for additional developing sites of disease. 5. Stable 4.4 cm infrarenal abdominal aortic aneurysm. 6. Emphysema and aortic atherosclerosis. 7. Note that LEFT acetabular lesion is at greater risk for pathologic fracture due to cortical disruption along the medial margin. These results will be called to the ordering clinician or representative by the Radiologist Assistant, and communication documented in the PACS or [employer]. Aortic Atherosclerosis (ICD10-I70.0) and Emphysema (ICD10-J43.9). Electronically Signed   By: Zetta Bills M.D.   On: 06/18/2019 17:57       IMPRESSION/PLAN: 1. Progressive Metastatic Stage IIB, cT1cN1, NSCLC, not otherwise classified of the left hilum with progressive local disease in the chest, bilateral adrenal disease, and multifocal bone metastases including the spine, left humerus and left acetabulum. Dr. Lisbeth Renshaw discusses the patient's course and summarizes his case since our last encounter. With his bone disease, he would be a good candidate for palliative radiotherapy to the left acetabulum and head of the left humerus. We discussed the risks, benefits, short, and long term effects of radiotherapy, and the patient is interested in proceeding. Dr. Lisbeth Renshaw discusses the delivery and  logistics of radiotherapy and anticipates a course of 2 weeks of radiotherapy. Written consent is obtained and placed in the chart, a copy was provided to the patient. He will proceed with simulation following today's visit and begin radiation early next week.  2. Pain secondary to #1. Although there does not appear to be a CT or PET correlate to his symptoms in the base of the neck extending down the later chest wall. We will follow this expectantly. He will continue oxycodone as prescribed. He will notify us if he has concerns about dosing of this. He is in pain currently and a single dose of oxycodone/APAP 5/325 was given to him po x 1 tablet in our clinic prior to simulation.   In a visit lasting 35 minutes, greater than 50% of the time was spent face to face discussing the patient's condition, in preparation for the discussion, and coordinating the patient's care.   The above documentation reflects my direct findings during this shared patient visit. Please see the separate note by Dr. Lisbeth Renshaw on this date for the remainder of the patient's plan of care.    Carola Rhine, PAC

## 2019-06-25 NOTE — Telephone Encounter (Signed)
TC to Pt. Spoke with his wife, she stated Pt is about the same. Pt to come in today to see Sandi Mealy PA in symptom management before his rad onc appointment today

## 2019-06-25 NOTE — Patient Instructions (Signed)
Rehydration, Adult Rehydration is the replacement of body fluids and salts and minerals (electrolytes) that are lost during dehydration. Dehydration is when there is not enough fluid or water in the body. This happens when you lose more fluids than you take in. Common causes of dehydration include:  Vomiting.  Diarrhea.  Excessive sweating, such as from heat exposure or exercise.  Taking medicines that cause the body to lose excess fluid (diuretics).  Impaired kidney function.  Not drinking enough fluid.  Certain illnesses or infections.  Certain poorly controlled long-term (chronic) illnesses, such as diabetes, heart disease, and kidney disease.  Symptoms of mild dehydration may include thirst, dry lips and mouth, dry skin, and dizziness. Symptoms of severe dehydration may include increased heart rate, confusion, fainting, and not urinating. You can rehydrate by drinking certain fluids or getting fluids through an IV tube, as told by your health care provider. What are the risks? Generally, rehydration is safe. However, one problem that can happen is taking in too much fluid (overhydration). This is rare. If overhydration happens, it can cause an electrolyte imbalance, kidney failure, or a decrease in salt (sodium) levels in the body. How to rehydrate Follow instructions from your health care provider for rehydration. The kind of fluid you should drink and the amount you should drink depend on your condition.  If directed by your health care provider, drink an oral rehydration solution (ORS). This is a drink designed to treat dehydration that is found in pharmacies and retail stores. ? Make an ORS by following instructions on the package. ? Start by drinking small amounts, about  cup (120 mL) every 5-10 minutes. ? Slowly increase how much you drink until you have taken the amount recommended by your health care provider.  Drink enough clear fluids to keep your urine clear or pale  yellow. If you were instructed to drink an ORS, finish the ORS first, then start slowly drinking other clear fluids. Drink fluids such as: ? Water. Do not drink only water. Doing that can lead to having too little sodium in your body (hyponatremia). ? Ice chips. ? Fruit juice that you have added water to (diluted juice). ? Low-calorie sports drinks.  If you are severely dehydrated, your health care provider may recommend that you receive fluids through an IV tube in the hospital.  Do not take sodium tablets. Doing that can lead to the condition of having too much sodium in your body (hypernatremia). Eating while you rehydrate Follow instructions from your health care provider about what to eat while you rehydrate. Your health care provider may recommend that you slowly begin eating regular foods in small amounts.  Eat foods that contain a healthy balance of electrolytes, such as bananas, oranges, potatoes, tomatoes, and spinach.  Avoid foods that are greasy or contain a lot of fat or sugar.  In some cases, you may get nutrition through a feeding tube that is passed through your nose and into your stomach (nasogastric tube, or NG tube). This may be done if you have uncontrolled vomiting or diarrhea. Beverages to avoid Certain beverages may make dehydration worse. While you rehydrate, avoid:  Alcohol.  Caffeine.  Drinks that contain a lot of sugar. These include: ? High-calorie sports drinks. ? Fruit juice that is not diluted. ? Soda.  Check nutrition labels to see how much sugar or caffeine a beverage contains. Signs of dehydration recovery You may be recovering from dehydration if:  You are urinating more often than before you started  rehydrating.  Your urine is clear or pale yellow.  Your energy level improves.  You vomit less frequently.  You have diarrhea less frequently.  Your appetite improves or returns to normal.  You feel less dizzy or less light-headed.  Your  skin tone and color start to look more normal. Contact a health care provider if:  You continue to have symptoms of mild dehydration, such as: ? Thirst. ? Dry lips. ? Slightly dry mouth. ? Dry, warm skin. ? Dizziness.  You continue to vomit or have diarrhea. Get help right away if:  You have symptoms of dehydration that get worse.  You feel: ? Confused. ? Weak. ? Like you are going to faint.  You have not urinated in 6-8 hours.  You have very dark urine.  You have trouble breathing.  Your heart rate while sitting still is over 100 beats a minute.  You cannot drink fluids without vomiting.  You have vomiting or diarrhea that: ? Gets worse. ? Does not go away.  You have a fever. This information is not intended to replace advice given to you by your health care provider. Make sure you discuss any questions you have with your health care provider. Document Revised: 01/04/2017 Document Reviewed: 03/18/2015 Elsevier Patient Education  [place]. Hypomagnesemia Hypomagnesemia is a condition in which the level of magnesium in the blood is low. Magnesium is a mineral that is found in many foods. It is used in many different processes in the body. Hypomagnesemia can affect every organ in the body. In severe cases, it can cause life-threatening problems. What are the causes? This condition may be caused by:  Not getting enough magnesium in your diet.  Malnutrition.  Problems with absorbing magnesium from the intestines.  Dehydration.  Alcohol abuse.  Vomiting.  Severe or chronic diarrhea.  Some medicines, including medicines that make you urinate more (diuretics).  Certain diseases, such as kidney disease, diabetes, celiac disease, and overactive thyroid. What are the signs or symptoms? Symptoms of this condition include:  Loss of appetite.  Nausea and vomiting.  Involuntary shaking or trembling of a body part (tremor).  Muscle weakness.  Tingling  in the arms and legs.  Sudden tightening of muscles (muscle spasms).  Confusion.  Psychiatric issues, such as depression, irritability, or psychosis.  A feeling of fluttering of the heart.  Seizures. These symptoms are more severe if magnesium levels drop suddenly. How is this diagnosed? This condition may be diagnosed based on:  Your symptoms and medical history.  A physical exam.  Blood and urine tests. How is this treated? Treatment depends on the cause and the severity of the condition. It may be treated with:  A magnesium supplement. This can be taken in pill form. If the condition is severe, magnesium is usually given through an IV.  Changes to your diet. You may be directed to eat foods that have a lot of magnesium, such as green leafy vegetables, peas, beans, and nuts.  Stopping any intake of alcohol. Follow these instructions at home:      Make sure that your diet includes foods with magnesium. Foods that have a lot of magnesium in them include: ? Green leafy vegetables, such as spinach and broccoli. ? Beans and peas. ? Nuts and seeds, such as almonds and sunflower seeds. ? Whole grains, such as whole grain bread and fortified cereals.  Take magnesium supplements if your health care provider tells you to do that. Take them as directed.  Take over-the-counter and prescription medicines only as told by your health care provider.  Have your magnesium levels monitored as told by your health care provider.  When you are active, drink fluids that contain electrolytes.  Avoid drinking alcohol.  Keep all follow-up visits as told by your health care provider. This is important. Contact a health care provider if:  You get worse instead of better.  Your symptoms return. Get help right away if you:  Develop severe muscle weakness.  Have trouble breathing.  Feel that your heart is racing. Summary  Hypomagnesemia is a condition in which the level of  magnesium in the blood is low.  Hypomagnesemia can affect every organ in the body.  Treatment may include eating more foods that contain magnesium, taking magnesium supplements, and not drinking alcohol.  Have your magnesium levels monitored as told by your health care provider. This information is not intended to replace advice given to you by your health care provider. Make sure you discuss any questions you have with your health care provider. Document Revised: 01/04/2017 Document Reviewed: 12/24/2016 Elsevier Patient Education  [place]. Hypokalemia Hypokalemia means that the amount of potassium in the blood is lower than normal. Potassium is a chemical (electrolyte) that helps regulate the amount of fluid in the body. It also stimulates muscle tightening (contraction) and helps nerves work properly. Normally, most of the body's potassium is inside cells, and only a very small amount is in the blood. Because the amount in the blood is so small, minor changes to potassium levels in the blood can be life-threatening. What are the causes? This condition may be caused by:  Antibiotic medicine.  Diarrhea or vomiting. Taking too much of a medicine that helps you have a bowel movement (laxative) can cause diarrhea and lead to hypokalemia.  Chronic kidney disease (CKD).  Medicines that help the body get rid of excess fluid (diuretics).  Eating disorders, such as bulimia.  Low magnesium levels in the body.  Sweating a lot. What are the signs or symptoms? Symptoms of this condition include:  Weakness.  Constipation.  Fatigue.  Muscle cramps.  Mental confusion.  Skipped heartbeats or irregular heartbeat (palpitations).  Tingling or numbness. How is this diagnosed? This condition is diagnosed with a blood test. How is this treated? This condition may be treated by:  Taking potassium supplements by mouth.  Adjusting the medicines that you take.  Eating more  foods that contain a lot of potassium. If your potassium level is very low, you may need to get potassium through an IV and be monitored in the hospital. Follow these instructions at home:   Take over-the-counter and prescription medicines only as told by your health care provider. This includes vitamins and supplements.  Eat a healthy diet. A healthy diet includes fresh fruits and vegetables, whole grains, healthy fats, and lean proteins.  If instructed, eat more foods that contain a lot of potassium. This includes: ? Nuts, such as peanuts and pistachios. ? Seeds, such as sunflower seeds and pumpkin seeds. ? Peas, lentils, and lima beans. ? Whole grain and bran cereals and breads. ? Fresh fruits and vegetables, such as apricots, avocado, bananas, cantaloupe, kiwi, oranges, tomatoes, asparagus, and potatoes. ? Orange juice. ? Tomato juice. ? Red meats. ? Yogurt.  Keep all follow-up visits as told by your health care provider. This is important. Contact a health care provider if you:  Have weakness that gets worse.  Feel your heart pounding or racing.  Vomit.  Have diarrhea.  Have diabetes (diabetes mellitus) and you have trouble keeping your blood sugar (glucose) in your target range. Get help right away if you:  Have chest pain.  Have shortness of breath.  Have vomiting or diarrhea that lasts for more than 2 days.  Faint. Summary  Hypokalemia means that the amount of potassium in the blood is lower than normal.  This condition is diagnosed with a blood test.  Hypokalemia may be treated by taking potassium supplements, adjusting the medicines that you take, or eating more foods that are high in potassium.  If your potassium level is very low, you may need to get potassium through an IV and be monitored in the hospital. This information is not intended to replace advice given to you by your health care provider. Make sure you discuss any questions you have with your  health care provider. Document Revised: 09/04/2017 Document Reviewed: 09/04/2017 Elsevier Patient Education  [place].

## 2019-06-25 NOTE — Telephone Encounter (Signed)
CRITICAL VALUE STICKER  CRITICAL VALUE: Mg 1.4  RECEIVER (on-site recipient of call): Shelle Iron, RN  DATE & TIME NOTIFIED: 06/25/2019 @ 1:39 pm  MESSENGER (representative from lab):  MD NOTIFIED: Sandi Mealy, PA  TIME OF NOTIFICATION: 06/25/2019 @ 1:40 pm  RESPONSE: PA is aware and will order appropriate replacement.

## 2019-06-25 NOTE — Telephone Encounter (Signed)
Spoke with wife about appointment for patient today. Verbalized she understood this was in person appointment.

## 2019-06-26 DIAGNOSIS — C7951 Secondary malignant neoplasm of bone: Secondary | ICD-10-CM | POA: Diagnosis not present

## 2019-06-26 DIAGNOSIS — C3412 Malignant neoplasm of upper lobe, left bronchus or lung: Secondary | ICD-10-CM | POA: Diagnosis not present

## 2019-06-26 DIAGNOSIS — Z510 Encounter for antineoplastic radiation therapy: Secondary | ICD-10-CM | POA: Diagnosis not present

## 2019-06-26 NOTE — Progress Notes (Signed)
[facility] Progress Note   Seth [name] [id number] [birth year] 74 y.o.  Seth [name] is managed by Seth [name]. Seth [name]  Actively treated with chemotherapy/immunotherapy/hormonal therapy: yes  Current therapy: Keytruda and Alimta  Next scheduled appointment with provider: To be scheduled  Assessment: Plan:    Hypomagnesemia - Plan: Magnesium, sodium chloride 0.9 % 1,000 mL with magnesium sulfate 4 g infusion, sodium chloride flush (NS) 0.9 % injection 10 mL, heparin lock flush 100 unit/mL, DISCONTINUED: magnesium sulfate IVPB 4 g 100 mL  Hypokalemia - Plan: CMP ([place] only), potassium chloride SA (KLOR-CON) CR tablet 40 mEq  Anemia, unspecified type - Plan: CBC with Differential ([place] Only)  Hypotension due to hypovolemia - Plan: sodium chloride flush (NS) 0.9 % injection 10 mL, heparin lock flush 100 unit/mL  Non-intractable vomiting with nausea, unspecified vomiting type - Plan: ondansetron (ZOFRAN) injection 8 mg  Adenocarcinoma of left lung, stage 4 (HCC)  Bone metastases (HCC)   Hypomagnesemia: Magnesium level returned at 1.4 today.  The patient was given magnesium sulfate 4 g IV and 1 L of normal saline today.  Hypokalemia: Potassium level returned at 3.2 today.  Patient was given magnesium chloride 40 mEq p.o. x1.  History of anemia: CBC returned today with a hemoglobin of 8.8 and a hematocrit of 27.6.  No intervention was indicated.  Hypotension: The patient was given 1 L of normal saline IV today.  Nausea and vomiting: Patient was given Zofran 8 mg IV x1 today.  Stage IV adenocarcinoma of the left lung with bone metastasis: The patient is status post treatment with Keytruda and Alimta.  He is currently receiving radiation therapy to his back and hip.  A follow-up appointment with Seth [name] needs to be scheduled.  Please see After Visit Summary for patient specific instructions.  Future Appointments  Date Time Provider  [place]  06/29/2019  4:55 PM Seth Rudd, MD [facility] None  06/30/2019  3:30 PM [facility] None  06/30/2019  4:15 PM Seth [name], DPM [facility]  07/01/2019  3:05 PM [facility] LINAC 4 [facility] None  07/02/2019 10:15 AM Seth Koyanagi, MD [facility] None  07/02/2019  3:05 PM [facility] LINAC 4 [facility] None  07/03/2019  3:05 PM [facility] LINAC 4 [facility] None  07/07/2019  4:15 PM [facility] [id number] [facility] None  07/08/2019  2:45 PM [facility] LINAC 4 [facility] None  07/09/2019 10:15 AM [facility] LINAC 4 [facility] None  07/10/2019 10:10 AM [facility] LINAC 4 [facility] None  07/13/2019 10:15 AM [facility] LINAC 4 [facility] None    Orders Placed This Encounter  Procedures  . CBC with Differential ([place] Only)  . Magnesium  . CMP ([place] only)       Subjective:   Patient ID:  Seth [name] is a 74 y.o. (DOB 05/04/1945) male.  Chief Complaint: No chief complaint on file.   HPI Seth [name] is a 74 y.o. male with a diagnosis of metastatic adenocarcinoma of the left lung with bone metastasis.  The patient is followed by Dr. Julien [name] and is status post treatment with [facility] and Alimta.  He is currently receiving radiation therapy to his back and hip.  He presents to the clinic today with constipation despite his use of senna and MiraLAX.  He also reports episodic dizziness and dark urine with decreased urinary output.  He is having nausea 80% of the time but is not taking any nausea medications.  He denies fevers, chills,  or sweats.  He presents to the clinic today with his wife.   Medications: I have reviewed the patient's current medications.  Allergies:  Allergies  Allergen Reactions  . Biaxin [Clarithromycin] Shortness Of Breath  . Clarithromycin Shortness Of Breath  . Iodinated Diagnostic Agents Hives  . Prednisone Other (See Comments)    Patient states he turns REALLY RED all over.  . Cortisone  Other (See Comments)    Turns red from breast up  . Flomax [Tamsulosin Hcl]     Blurred vision and lower back pain   . Hepatitis B Virus Vaccines     Nerve problems States his previous doctor told him it was due to this   . Iodine Hives    Dye from nuclear medicine test    Past Medical History:  Diagnosis Date  . AAA (abdominal aortic aneurysm) ([place])   . Allergic rhinitis   . Anxiety   . Back pain   . CAD (coronary artery disease)   . Cataracts, bilateral   . Emphysema, unspecified ([place])     " moderate"  . Essential hypertension, benign   . GERD (gastroesophageal reflux disease)   . Gout   . Headache    migraines  . Hilar adenopathy   . History of hiatal hernia   . History of sciatica   . Hyperlipidemia   . lung ca dx'd 02/2018  . Lung nodule   . Pneumonia    as child  . Prostate hypertrophy   . PVD (peripheral vascular disease) ([place])   . Seasonal allergies   . Subclavian steal syndrome of left subclavian artery   . Wears dentures     Past Surgical History:  Procedure Laterality Date  . CARDIAC CATHETERIZATION     stent placement  . COLONOSCOPY W/ BIOPSIES AND POLYPECTOMY    . ILIAC ARTERY STENT    . IR IMAGING GUIDED PORT INSERTION  03/23/2019  . MULTIPLE TOOTH EXTRACTIONS    . VIDEO BRONCHOSCOPY WITH ENDOBRONCHIAL NAVIGATION N/A 02/26/2018   Procedure: VIDEO BRONCHOSCOPY WITH ENDOBRONCHIAL NAVIGATION;  Surgeon: Seth Nash, DO;  Location[place];  Service: Thoracic;  Laterality: N/A;  . VIDEO BRONCHOSCOPY WITH ENDOBRONCHIAL NAVIGATION N/A 03/19/2018   Procedure: VIDEO BRONCHOSCOPY WITH ENDOBRONCHIAL NAVIGATION and endobronchial ultrasound;  Surgeon: Seth Nash, DO;  Location: [place];  Service: Thoracic;  Laterality: N/A;  . VIDEO BRONCHOSCOPY WITH ENDOBRONCHIAL ULTRASOUND N/A 02/26/2018   Procedure: VIDEO BRONCHOSCOPY WITH ENDOBRONCHIAL ULTRASOUND;  Surgeon: Seth Nash, DO;  Location: [place];  Service: Thoracic;  Laterality: N/A;  . VIDEO BRONCHOSCOPY  WITH ENDOBRONCHIAL ULTRASOUND N/A 03/19/2018   Procedure: NAVIGATION BRONCHOSCOPY;  Surgeon: Seth Nash, DO;  [facility];  Service: Thoracic;  Laterality: N/A;    Family History  Problem Relation Age of Onset  . Hypertension Mother   . Heart disease Mother   . Stroke Mother   . Heart disease Paternal Grandmother   . Diabetes Paternal Grandmother   . Hyperlipidemia Paternal Grandmother   . Diabetes Sister   . Heart disease Sister   . Hyperlipidemia Sister   . Heart disease Brother   . Hyperlipidemia Brother   . Hypertension Brother   . Diabetes Paternal Grandfather   . Heart disease Paternal Grandfather   . Hyperlipidemia Paternal Grandfather   . Heart disease Maternal Uncle        x 2  . Cancer Maternal Grandmother        type unknown, ? lung  . Leukemia  Maternal Uncle     Social History   Socioeconomic History  . Marital status: Married    Spouse name: Not on file  . Number of children: 1  . Years of education: Not on file  . Highest education level: Not on file  Occupational History  . Occupation: retired  Tobacco Use  . Smoking status: Former Smoker    Packs/day: 3.00    Years: 50.00    Pack years: 150.00    Types: Cigarettes    Quit date: 04/05/2016    Years since quitting: 3.2  . Smokeless tobacco: Never Used  Substance and Sexual Activity  . Alcohol use: Yes    Alcohol/week: 0.0 standard drinks    Comment: rare  . Drug use: No  . Sexual activity: Not on file  Other Topics Concern  . Not on file  Social History Narrative   Married   Exercise: No   Education: GED   Social Determinants of Radio broadcast assistant Strain:   . Difficulty of Paying Living Expenses:   Food Insecurity: No Food Insecurity  . Worried About Charity fundraiser in the Last Year: Never true  . Ran Out of Food in the Last Year: Never true  Transportation Needs: No Transportation Needs  . Lack of Transportation (Medical): No  . Lack of Transportation  (Non-Medical): No  Physical Activity:   . Days of Exercise per Week:   . Minutes of Exercise per Session:   Stress:   . Feeling of Stress :   Social Connections:   . Frequency of Communication with Friends and Family:   . Frequency of Social Gatherings with Friends and Family:   . Attends Religious Services:   . Active Member of Clubs or Organizations:   . Attends Archivist Meetings:   Marland Kitchen Marital Status:   Intimate Partner Violence:   . Fear of Current or Ex-Partner:   . Emotionally Abused:   Marland Kitchen Physically Abused:   . Sexually Abused:     Past Medical History, Surgical history, Social history, and Family history were reviewed and updated as appropriate.   Please see review of systems for further details on the patient's review from today.   Review of Systems:  Review of Systems  Constitutional: Positive for appetite change. Negative for chills, diaphoresis and fever.  Respiratory: Negative for cough, choking, shortness of breath and wheezing.   Cardiovascular: Negative for chest pain and palpitations.  Gastrointestinal: Positive for nausea. Negative for constipation, diarrhea and vomiting.  Genitourinary: Negative for decreased urine volume.  Neurological: Positive for dizziness. Negative for headaches.    Objective:   Physical Exam:  BP 110/70   Pulse (!) 106   Temp 97.9 F (36.6 C) (Temporal)   Resp 20   Ht 6\' 1"  (1.854 m)   SpO2 96%   BMI 20.09 kg/m  ECOG: 1  Physical Exam Constitutional:      General: He is not in acute distress.    Appearance: He is not diaphoretic.  HENT:     Head: Normocephalic and atraumatic.  Cardiovascular:     Rate and Rhythm: Normal rate and regular rhythm.     Heart sounds: Normal heart sounds. No murmur. No friction rub. No gallop.   Pulmonary:     Effort: Pulmonary effort is normal. No respiratory distress.     Breath sounds: Normal breath sounds. No wheezing or rales.  Skin:    General: Skin is warm and dry.  Findings: No erythema or rash.  Neurological:     Mental Status: He is alert.     Gait: Gait abnormal (The patient is ambulating with the use of a wheelchair.).     Lab Review:     Component Value Date/Time   NA 134 (L) 06/25/2019 1245   NA 133 (L) 01/19/2019 1457   K 3.2 (L) 06/25/2019 1245   CL 93 (L) 06/25/2019 1245   CO2 25 06/25/2019 1245   GLUCOSE 91 06/25/2019 1245   BUN 16 06/25/2019 1245   BUN 9 01/19/2019 1457   CREATININE 0.78 06/25/2019 1245   CREATININE 1.09 03/23/2015 0928   CALCIUM 9.3 06/25/2019 1245   PROT 7.2 06/25/2019 1245   PROT 6.4 04/08/2018 0806   ALBUMIN 3.1 (L) 06/25/2019 1245   ALBUMIN 4.2 04/08/2018 0806   AST 42 (H) 06/25/2019 1245   ALT 9 06/25/2019 1245   ALKPHOS 160 (H) 06/25/2019 1245   BILITOT 1.1 06/25/2019 1245   GFRNONAA >60 06/25/2019 1245   GFRNONAA 69 03/23/2015 0928   GFRAA >60 06/25/2019 1245   GFRAA 80 03/23/2015 0928       Component Value Date/Time   WBC 7.2 06/25/2019 1245   WBC 1.4 (LL) 06/09/2019 1452   RBC 2.97 (L) 06/25/2019 1245   HGB 8.8 (L) 06/25/2019 1245   HCT 27.6 (L) 06/25/2019 1245   PLT 180 06/25/2019 1245   MCV 92.9 06/25/2019 1245   MCV 92.1 12/24/2017 1551   MCH 29.6 06/25/2019 1245   MCHC 31.9 06/25/2019 1245   RDW 18.8 (H) 06/25/2019 1245   LYMPHSABS 0.6 (L) 06/25/2019 1245   MONOABS 0.9 06/25/2019 1245   EOSABS 0.1 06/25/2019 1245   BASOSABS 0.0 06/25/2019 1245   -------------------------------  Imaging from last 24 hours (if applicable):  Radiology interpretation: CT Abdomen Pelvis Wo Contrast  Result Date: 06/18/2019 CLINICAL DATA:  Restaging lung cancer EXAM: CT CHEST, ABDOMEN AND PELVIS WITHOUT CONTRAST TECHNIQUE: Multidetector CT imaging of the chest, abdomen and pelvis was performed following the standard protocol without IV contrast. COMPARISON:  04/17/2019 FINDINGS: CT CHEST FINDINGS Cardiovascular: RIGHT-sided Port-A-Cath terminating at the caval to atrial junction. Calcified  atheromatous plaque throughout the thoracic aorta without aneurysmal dilation. Small pericardial effusion. Similar to prior study. Central pulmonary vasculature is of normal caliber. Mediastinum/Nodes: Enlarging mediastinal lymph nodes, pre-vascular and high AP window lymph nodes (image 28, series 2) 2.0 cm, previously less than a cm. (Image 30): 1.4 cm lymph node previously approximately 1 cm no thoracic inlet adenopathy. No axillary lymphadenopathy. No subcarinal or gross hilar adenopathy. Lungs/Pleura: RIGHT apical nodularity along the posterior surface of the pleura in the RIGHT upper lobe is stable approximately 16 mm (image 38, series 4) Since the prior study there is been interval increase in size of small pulmonary nodules throughout the LEFT and RIGHT chest for instance, on image 83 of series 4 a nearly 6 mm nodule previously measured approximately 4 mm. RIGHT middle lobe nodule (image 113 of series 3 6 mm along the anterior aspect of the RIGHT middle lobe previously 4-5 mm. These changes are subtle. These nodules are innumerable. Post treatment changes in the LEFT chest arising from LEFT hilum with pleural and parenchymal distortion are associated with increasing consolidation and soft tissue. A discrete nodule in the LEFT upper lobe on image 81 of series 3 measuring 6 mm was previously 3-4 mm on the prior exam. No pleural effusion. Extensive pulmonary emphysema. Musculoskeletal: No sign of chest wall mass. See below for  full musculoskeletal details. CT ABDOMEN PELVIS FINDINGS Hepatobiliary: Liver is unremarkable on noncontrast imaging. Gallbladder without pericholecystic stranding. No biliary ductal dilation grossly on noncontrast imaging. Pancreas: Pancreas is normal without focal lesion. Spleen: Spleen normal size without focal lesion. Adrenals/Urinary Tract: Bilateral adrenal masses 3.8 x 3.0 cm on the LEFT approximately 3.1 x 2.7 cm previously. RIGHT adrenal gland measuring 3.4 by 3.1 cm in greatest  axial dimension previously 3.3 x 2.5 cm. The superior aspect of the infiltrative process in the adrenal gland on image 66 of series 2 has increased in thickness as well measuring approximately 15 mm greatest thickness as compared to 10 mm. No sign of hydronephrosis. Urinary bladder is trabeculated. Stomach/Bowel: No acute bowel process. Vascular/Lymphatic: The infrarenal abdominal aortic aneurysm shows a similar appearance measuring 4.6 cm greatest axial dimension. Also with dilated LEFT common iliac artery with similar appearance. No adenopathy in the retroperitoneum. No adenopathy in the pelvis. Reproductive: Prostate mildly enlarged is similar to the prior study. Other: No sign of hernia or ascites. Musculoskeletal: Large LEFT acetabular metastasis extending into the ischium measuring approximately 3.4 x 2.9 cm greatest axial dimension previously approximately 3.1 cm, violating the medial cortex extending towards the obturator muscle on today's study. Some sclerosis partially visualized in the cervical spine Sclerosis of the T9 and T11 vertebral bodies with extension into posterior elements at T9. On sagittal reconstructions sclerotic areas in the cervical spine appear more pronounced and at multiple levels though there is degenerative change in the cervical spine. L2 sclerosis more conspicuous than on previous imaging. Marked anterolisthesis of L5 on S1 with pars defects and adjacent degenerative changes similar to the previous study. IMPRESSION: 1. Enlarging mediastinal lymph nodes, consistent with worsening metastatic disease. 2. Small nodules scattered throughout the chest are slightly increased in size and number compatible with worsening of metastatic disease in the chest and associated with increasing soft tissue about the LEFT hilum some of which may be related to post treatment change but much of which is suspicious for disease particularly on image 32 of series 2 where lobulated margins along the medial  aspect of consolidative change at the peripheral LEFT chest are noted. 3. Enlarging adrenal masses, consistent with worsening metastatic disease. 4. Multifocal areas of suspicion in the spine as well and not mentioned above subtle sclerosis in the LEFT humeral head all concerning for additional developing sites of disease. 5. Stable 4.4 cm infrarenal abdominal aortic aneurysm. 6. Emphysema and aortic atherosclerosis. 7. Note that LEFT acetabular lesion is at greater risk for pathologic fracture due to cortical disruption along the medial margin. These results will be called to the ordering clinician or representative by the Radiologist Assistant, and communication documented in the PACS or [employer]. Aortic Atherosclerosis (ICD10-I70.0) and Emphysema (ICD10-J43.9). Electronically Signed   By: Zetta Bills M.D.   On: 06/18/2019 17:57   CT Head Wo Contrast  Result Date: 06/09/2019 CLINICAL DATA:  Dizziness, ataxia. EXAM: CT HEAD WITHOUT CONTRAST TECHNIQUE: Contiguous axial images were obtained from the base of the skull through the vertex without intravenous contrast. COMPARISON:  None. FINDINGS: Brain: Mild chronic ischemic white matter disease is noted. No mass effect or midline shift is noted. Ventricular size is within normal limits. There is no evidence of mass lesion, hemorrhage or acute infarction. Vascular: No hyperdense vessel or unexpected calcification. Skull: Normal. Negative for fracture or focal lesion. Sinuses/Orbits: No acute finding. Other: Fluid is noted in right mastoid air cells. IMPRESSION: Mild chronic ischemic white matter disease. No acute  intracranial abnormality seen. Electronically Signed   By: Marijo Conception M.D.   On: 06/09/2019 13:55   CT Chest Wo Contrast  Result Date: 06/18/2019 CLINICAL DATA:  Restaging lung cancer EXAM: CT CHEST, ABDOMEN AND PELVIS WITHOUT CONTRAST TECHNIQUE: Multidetector CT imaging of the chest, abdomen and pelvis was performed following the  standard protocol without IV contrast. COMPARISON:  04/17/2019 FINDINGS: CT CHEST FINDINGS Cardiovascular: RIGHT-sided Port-A-Cath terminating at the caval to atrial junction. Calcified atheromatous plaque throughout the thoracic aorta without aneurysmal dilation. Small pericardial effusion. Similar to prior study. Central pulmonary vasculature is of normal caliber. Mediastinum/Nodes: Enlarging mediastinal lymph nodes, pre-vascular and high AP window lymph nodes (image 28, series 2) 2.0 cm, previously less than a cm. (Image 30): 1.4 cm lymph node previously approximately 1 cm no thoracic inlet adenopathy. No axillary lymphadenopathy. No subcarinal or gross hilar adenopathy. Lungs/Pleura: RIGHT apical nodularity along the posterior surface of the pleura in the RIGHT upper lobe is stable approximately 16 mm (image 38, series 4) Since the prior study there is been interval increase in size of small pulmonary nodules throughout the LEFT and RIGHT chest for instance, on image 83 of series 4 a nearly 6 mm nodule previously measured approximately 4 mm. RIGHT middle lobe nodule (image 113 of series 3 6 mm along the anterior aspect of the RIGHT middle lobe previously 4-5 mm. These changes are subtle. These nodules are innumerable. Post treatment changes in the LEFT chest arising from LEFT hilum with pleural and parenchymal distortion are associated with increasing consolidation and soft tissue. A discrete nodule in the LEFT upper lobe on image 81 of series 3 measuring 6 mm was previously 3-4 mm on the prior exam. No pleural effusion. Extensive pulmonary emphysema. Musculoskeletal: No sign of chest wall mass. See below for full musculoskeletal details. CT ABDOMEN PELVIS FINDINGS Hepatobiliary: Liver is unremarkable on noncontrast imaging. Gallbladder without pericholecystic stranding. No biliary ductal dilation grossly on noncontrast imaging. Pancreas: Pancreas is normal without focal lesion. Spleen: Spleen normal size  without focal lesion. Adrenals/Urinary Tract: Bilateral adrenal masses 3.8 x 3.0 cm on the LEFT approximately 3.1 x 2.7 cm previously. RIGHT adrenal gland measuring 3.4 by 3.1 cm in greatest axial dimension previously 3.3 x 2.5 cm. The superior aspect of the infiltrative process in the adrenal gland on image 66 of series 2 has increased in thickness as well measuring approximately 15 mm greatest thickness as compared to 10 mm. No sign of hydronephrosis. Urinary bladder is trabeculated. Stomach/Bowel: No acute bowel process. Vascular/Lymphatic: The infrarenal abdominal aortic aneurysm shows a similar appearance measuring 4.6 cm greatest axial dimension. Also with dilated LEFT common iliac artery with similar appearance. No adenopathy in the retroperitoneum. No adenopathy in the pelvis. Reproductive: Prostate mildly enlarged is similar to the prior study. Other: No sign of hernia or ascites. Musculoskeletal: Large LEFT acetabular metastasis extending into the ischium measuring approximately 3.4 x 2.9 cm greatest axial dimension previously approximately 3.1 cm, violating the medial cortex extending towards the obturator muscle on today's study. Some sclerosis partially visualized in the cervical spine Sclerosis of the T9 and T11 vertebral bodies with extension into posterior elements at T9. On sagittal reconstructions sclerotic areas in the cervical spine appear more pronounced and at multiple levels though there is degenerative change in the cervical spine. L2 sclerosis more conspicuous than on previous imaging. Marked anterolisthesis of L5 on S1 with pars defects and adjacent degenerative changes similar to the previous study. IMPRESSION: 1. Enlarging mediastinal lymph nodes, consistent  with worsening metastatic disease. 2. Small nodules scattered throughout the chest are slightly increased in size and number compatible with worsening of metastatic disease in the chest and associated with increasing soft tissue about  the LEFT hilum some of which may be related to post treatment change but much of which is suspicious for disease particularly on image 32 of series 2 where lobulated margins along the medial aspect of consolidative change at the peripheral LEFT chest are noted. 3. Enlarging adrenal masses, consistent with worsening metastatic disease. 4. Multifocal areas of suspicion in the spine as well and not mentioned above subtle sclerosis in the LEFT humeral head all concerning for additional developing sites of disease. 5. Stable 4.4 cm infrarenal abdominal aortic aneurysm. 6. Emphysema and aortic atherosclerosis. 7. Note that LEFT acetabular lesion is at greater risk for pathologic fracture due to cortical disruption along the medial margin. These results will be called to the ordering clinician or representative by the Radiologist Assistant, and communication documented in the PACS or [employer]. Aortic Atherosclerosis (ICD10-I70.0) and Emphysema (ICD10-J43.9). Electronically Signed   By: Zetta Bills M.D.   On: 06/18/2019 17:57

## 2019-06-29 ENCOUNTER — Ambulatory Visit
Admission: RE | Admit: 2019-06-29 | Discharge: 2019-06-29 | Disposition: A | Discharge status: Home / Self Care | Payer: Medicare Other | Source: Ambulatory Visit | Attending: Radiation Oncology | Admitting: Radiation Oncology

## 2019-06-29 ENCOUNTER — Other Ambulatory Visit: Payer: Self-pay

## 2019-06-29 DIAGNOSIS — Z510 Encounter for antineoplastic radiation therapy: Secondary | ICD-10-CM | POA: Diagnosis not present

## 2019-06-29 DIAGNOSIS — C3412 Malignant neoplasm of upper lobe, left bronchus or lung: Secondary | ICD-10-CM | POA: Diagnosis not present

## 2019-06-29 DIAGNOSIS — C7951 Secondary malignant neoplasm of bone: Secondary | ICD-10-CM | POA: Diagnosis not present

## 2019-06-30 ENCOUNTER — Ambulatory Visit
Admission: RE | Admit: 2019-06-30 | Discharge: 2019-06-30 | Disposition: A | Discharge status: Home / Self Care | Payer: Medicare Other | Source: Ambulatory Visit | Attending: Radiation Oncology | Admitting: Radiation Oncology

## 2019-06-30 ENCOUNTER — Ambulatory Visit (INDEPENDENT_AMBULATORY_CARE_PROVIDER_SITE_OTHER): Payer: Medicare Other | Admitting: Podiatry

## 2019-06-30 ENCOUNTER — Other Ambulatory Visit: Payer: Self-pay

## 2019-06-30 ENCOUNTER — Encounter: Payer: Self-pay | Admitting: Podiatry

## 2019-06-30 DIAGNOSIS — B351 Tinea unguium: Secondary | ICD-10-CM | POA: Diagnosis not present

## 2019-06-30 DIAGNOSIS — Z510 Encounter for antineoplastic radiation therapy: Secondary | ICD-10-CM | POA: Diagnosis not present

## 2019-06-30 DIAGNOSIS — C7951 Secondary malignant neoplasm of bone: Secondary | ICD-10-CM | POA: Diagnosis not present

## 2019-06-30 DIAGNOSIS — M79674 Pain in right toe(s): Secondary | ICD-10-CM

## 2019-06-30 DIAGNOSIS — I739 Peripheral vascular disease, unspecified: Secondary | ICD-10-CM

## 2019-06-30 DIAGNOSIS — M79675 Pain in left toe(s): Secondary | ICD-10-CM | POA: Diagnosis not present

## 2019-06-30 DIAGNOSIS — C3412 Malignant neoplasm of upper lobe, left bronchus or lung: Secondary | ICD-10-CM | POA: Diagnosis not present

## 2019-06-30 NOTE — Patient Instructions (Signed)

## 2019-07-01 ENCOUNTER — Other Ambulatory Visit: Payer: Self-pay

## 2019-07-01 ENCOUNTER — Ambulatory Visit
Admission: RE | Admit: 2019-07-01 | Discharge: 2019-07-01 | Disposition: A | Discharge status: Home / Self Care | Payer: Medicare Other | Source: Ambulatory Visit | Attending: Radiation Oncology | Admitting: Radiation Oncology

## 2019-07-01 DIAGNOSIS — C7951 Secondary malignant neoplasm of bone: Secondary | ICD-10-CM | POA: Diagnosis not present

## 2019-07-01 DIAGNOSIS — Z510 Encounter for antineoplastic radiation therapy: Secondary | ICD-10-CM | POA: Diagnosis not present

## 2019-07-01 DIAGNOSIS — C3412 Malignant neoplasm of upper lobe, left bronchus or lung: Secondary | ICD-10-CM | POA: Diagnosis not present

## 2019-07-02 ENCOUNTER — Other Ambulatory Visit: Payer: Self-pay

## 2019-07-02 ENCOUNTER — Encounter: Payer: Self-pay | Admitting: Orthopaedic Surgery

## 2019-07-02 ENCOUNTER — Ambulatory Visit
Admission: RE | Admit: 2019-07-02 | Discharge: 2019-07-02 | Disposition: A | Discharge status: Home / Self Care | Payer: Medicare Other | Source: Ambulatory Visit | Attending: Radiation Oncology | Admitting: Radiation Oncology

## 2019-07-02 ENCOUNTER — Ambulatory Visit (INDEPENDENT_AMBULATORY_CARE_PROVIDER_SITE_OTHER): Payer: Medicare Other | Admitting: Orthopaedic Surgery

## 2019-07-02 VITALS — Ht 74.0 in | Wt 152.0 lb

## 2019-07-02 DIAGNOSIS — C7951 Secondary malignant neoplasm of bone: Secondary | ICD-10-CM | POA: Diagnosis not present

## 2019-07-02 DIAGNOSIS — C3412 Malignant neoplasm of upper lobe, left bronchus or lung: Secondary | ICD-10-CM | POA: Diagnosis not present

## 2019-07-02 DIAGNOSIS — I2510 Atherosclerotic heart disease of native coronary artery without angina pectoris: Secondary | ICD-10-CM

## 2019-07-02 DIAGNOSIS — Z510 Encounter for antineoplastic radiation therapy: Secondary | ICD-10-CM | POA: Diagnosis not present

## 2019-07-02 NOTE — Progress Notes (Signed)
Office Visit Note   Patient: Seth [name]           Date of Birth: 1945-05-02           MRN: [id number] Visit Date: 07/02/2019              Requested by: Curt Bears, [place],  [place] [id number] PCP: [name], MD   Assessment & Plan: Visit Diagnoses:  1. Bone metastases ([place])     Plan: Impression is bony mets to the left acetabulum.  I reviewed the CT scan with the patient in detail and it does show a large lytic lesion of the medial acetabulum.  Given the location I do not feel that there are any good surgical options that would provide him with much benefit or relief.  I would recommend continuing palliative radiation to this area.  I would recommend serial CT scans to monitor healing of the lesion so that he may begin weightbearing if possible.  He may follow-up with me as needed.  Follow-Up Instructions: Return if symptoms worsen or fail to improve.   Orders:  No orders of the defined types were placed in this encounter.  No orders of the defined types were placed in this encounter.     Procedures: No procedures performed   Clinical Data: No additional findings.   Subjective: Chief Complaint  Patient presents with  . Left Hip - Pain    [name] is a 74 year old gentleman who is a referral from Dr.Mohammed for evaluation of a left acetabular lesion.  Patient has metastatic lung cancer and is currently on palliative radiation to his left shoulder and left acetabular area.  This is a consult for evaluation for possible surgical options.  He is currently only weightbearing for transfers due to the pain.  Otherwise he is nonweightbearing and in a wheelchair ambulator.   Review of Systems  Constitutional: Negative.   All other systems reviewed and are negative.    Objective: Vital Signs: Ht 6\' 2"  (1.88 m)   Wt 152 lb (68.9 kg)   BMI 19.52 kg/m   Physical Exam Vitals and nursing note reviewed.  Constitutional:    Appearance: He is well-developed.  HENT:     Head: Normocephalic and atraumatic.  Eyes:     Pupils: Pupils are equal, round, and reactive to light.  Pulmonary:     Effort: Pulmonary effort is normal.  Abdominal:     Palpations: Abdomen is soft.  Musculoskeletal:        General: Normal range of motion.     Cervical back: Neck supple.  Skin:    General: Skin is warm.  Neurological:     Mental Status: He is alert and oriented to person, place, and time.  Psychiatric:        Behavior: Behavior normal.        Thought Content: Thought content normal.        Judgment: Judgment normal.     Ortho Exam Left hip exam shows moderate discomfort with range of motion. Specialty Comments:  No specialty comments available.  Imaging: No results found.   PMFS History: Patient Active Problem List   Diagnosis Date Noted  . Bone metastases ([place]) 06/26/2019  . Pain from bone metastases ([place]) 06/26/2019  . Hypomagnesemia 06/01/2019  . Hypokalemia 06/01/2019  . Constipation 06/01/2019  . Port-A-Cath in place 04/21/2019  . Dizziness 04/19/2019  . Anemia 04/19/2019  . Severe anemia 04/19/2019  . Hypotension 04/18/2019  .  Elevated troponin level 04/18/2019  . Elevated troponin 04/18/2019  . Adenocarcinoma of left lung, stage 4 ([place]) 02/03/2019  . Encounter for antineoplastic immunotherapy 02/03/2019  . Frequent nosebleeds 05/28/2018  . Encounter for antineoplastic chemotherapy 04/18/2018  . Goals of care, counseling/discussion 04/18/2018  . Adenocarcinoma of left lung, stage 2 ([place]) 03/27/2018  . Abnormal PET of left lung   . S/P bronchoscopy   . Solitary lung nodule 02/20/2018  . Hilar adenopathy 02/20/2018  . Abnormal findings on diagnostic imaging of lung 02/20/2018  . Platelet inhibition due to Plavix 02/20/2018  . PVD (peripheral vascular disease) ([place]) 10/15/2013  . Tobacco use disorder 10/15/2013  . Pure hypercholesterolemia 10/15/2013  . Coronary artery disease involving  native coronary artery of native heart without angina pectoris 10/15/2013  . Cataract 09/29/2013  . Colon cancer screening 09/25/2013  . AAA (abdominal aortic aneurysm) without rupture ([place]) 10/22/2012  . Nicotine addiction 10/17/2012  . Atherosclerosis of native arteries of the extremities with ulceration(440.23) 10/17/2012  . Carotid bruit 10/17/2012  . Other nonspecific abnormal cardiovascular system function study 04/10/2012  . History of sciatica   . Essential hypertension, benign   . Seasonal allergies    Past Medical History:  Diagnosis Date  . AAA (abdominal aortic aneurysm) ([place])   . Allergic rhinitis   . Anxiety   . Back pain   . CAD (coronary artery disease)   . Cataracts, bilateral   . Emphysema, unspecified ([place])     " moderate"  . Essential hypertension, benign   . GERD (gastroesophageal reflux disease)   . Gout   . Headache    migraines  . Hilar adenopathy   . History of hiatal hernia   . History of sciatica   . Hyperlipidemia   . lung ca dx'd 02/2018  . Lung nodule   . Pneumonia    as child  . Prostate hypertrophy   . PVD (peripheral vascular disease) ([place])   . Seasonal allergies   . Subclavian steal syndrome of left subclavian artery   . Wears dentures     Family History  Problem Relation Age of Onset  . Hypertension Mother   . Heart disease Mother   . Stroke Mother   . Heart disease Paternal Grandmother   . Diabetes Paternal Grandmother   . Hyperlipidemia Paternal Grandmother   . Diabetes Sister   . Heart disease Sister   . Hyperlipidemia Sister   . Heart disease Brother   . Hyperlipidemia Brother   . Hypertension Brother   . Diabetes Paternal Grandfather   . Heart disease Paternal Grandfather   . Hyperlipidemia Paternal Grandfather   . Heart disease Maternal Uncle        x 2  . Cancer Maternal Grandmother        type unknown, ? lung  . Leukemia Maternal Uncle     Past Surgical History:  Procedure Laterality Date  . CARDIAC  CATHETERIZATION     stent placement  . COLONOSCOPY W/ BIOPSIES AND POLYPECTOMY    . ILIAC ARTERY STENT    . IR IMAGING GUIDED PORT INSERTION  03/23/2019  . MULTIPLE TOOTH EXTRACTIONS    . VIDEO BRONCHOSCOPY WITH ENDOBRONCHIAL NAVIGATION N/A 02/26/2018   Procedure: VIDEO BRONCHOSCOPY WITH ENDOBRONCHIAL NAVIGATION;  Surgeon: Garner Nash, DO;  Location: [facility];  Service: Thoracic;  Laterality: N/A;  . VIDEO BRONCHOSCOPY WITH ENDOBRONCHIAL NAVIGATION N/A 03/19/2018   Procedure: VIDEO BRONCHOSCOPY WITH ENDOBRONCHIAL NAVIGATION and endobronchial ultrasound;  Surgeon: Garner Nash, DO;  Location: [facility];  Service: Thoracic;  Laterality: N/A;  . VIDEO BRONCHOSCOPY WITH ENDOBRONCHIAL ULTRASOUND N/A 02/26/2018   Procedure: VIDEO BRONCHOSCOPY WITH ENDOBRONCHIAL ULTRASOUND;  Surgeon: Garner Nash, DO;  Location: [facility];  Service: Thoracic;  Laterality: N/A;  . VIDEO BRONCHOSCOPY WITH ENDOBRONCHIAL ULTRASOUND N/A 03/19/2018   Procedure: NAVIGATION BRONCHOSCOPY;  Surgeon: Garner Nash, DO;  Location: [facility];  Service: Thoracic;  Laterality: N/A;   Social History   Occupational History  . Occupation: retired  Tobacco Use  . Smoking status: Former Smoker    Packs/day: 3.00    Years: 50.00    Pack years: 150.00    Types: Cigarettes    Quit date: 04/05/2016    Years since quitting: 3.2  . Smokeless tobacco: Never Used  Substance and Sexual Activity  . Alcohol use: Yes    Alcohol/week: 0.0 standard drinks    Comment: rare  . Drug use: No  . Sexual activity: Not on file

## 2019-07-03 ENCOUNTER — Ambulatory Visit
Admission: RE | Admit: 2019-07-03 | Discharge: 2019-07-03 | Disposition: A | Discharge status: Home / Self Care | Payer: Medicare Other | Source: Ambulatory Visit | Attending: Radiation Oncology | Admitting: Radiation Oncology

## 2019-07-03 ENCOUNTER — Other Ambulatory Visit: Payer: Self-pay

## 2019-07-03 DIAGNOSIS — Z510 Encounter for antineoplastic radiation therapy: Secondary | ICD-10-CM | POA: Diagnosis not present

## 2019-07-03 DIAGNOSIS — C7951 Secondary malignant neoplasm of bone: Secondary | ICD-10-CM | POA: Diagnosis not present

## 2019-07-03 DIAGNOSIS — C3412 Malignant neoplasm of upper lobe, left bronchus or lung: Secondary | ICD-10-CM | POA: Diagnosis not present

## 2019-07-06 ENCOUNTER — Other Ambulatory Visit: Payer: Self-pay

## 2019-07-06 ENCOUNTER — Emergency Department (HOSPITAL_COMMUNITY): Payer: Medicare Other

## 2019-07-06 ENCOUNTER — Encounter (HOSPITAL_COMMUNITY): Payer: Self-pay | Admitting: Emergency Medicine

## 2019-07-06 ENCOUNTER — Emergency Department (HOSPITAL_COMMUNITY)
Admission: EM | Admit: 2019-07-06 | Discharge: 2019-07-06 | Disposition: A | Discharge status: Home / Self Care | Payer: Medicare Other | Attending: Emergency Medicine | Admitting: Emergency Medicine

## 2019-07-06 DIAGNOSIS — Z87891 Personal history of nicotine dependence: Secondary | ICD-10-CM | POA: Diagnosis not present

## 2019-07-06 DIAGNOSIS — R630 Anorexia: Secondary | ICD-10-CM | POA: Diagnosis not present

## 2019-07-06 DIAGNOSIS — Z955 Presence of coronary angioplasty implant and graft: Secondary | ICD-10-CM | POA: Diagnosis not present

## 2019-07-06 DIAGNOSIS — C799 Secondary malignant neoplasm of unspecified site: Secondary | ICD-10-CM

## 2019-07-06 DIAGNOSIS — R4182 Altered mental status, unspecified: Secondary | ICD-10-CM | POA: Diagnosis present

## 2019-07-06 DIAGNOSIS — I10 Essential (primary) hypertension: Secondary | ICD-10-CM | POA: Diagnosis not present

## 2019-07-06 DIAGNOSIS — I2510 Atherosclerotic heart disease of native coronary artery without angina pectoris: Secondary | ICD-10-CM | POA: Insufficient documentation

## 2019-07-06 DIAGNOSIS — Z79899 Other long term (current) drug therapy: Secondary | ICD-10-CM | POA: Insufficient documentation

## 2019-07-06 DIAGNOSIS — C7951 Secondary malignant neoplasm of bone: Secondary | ICD-10-CM | POA: Insufficient documentation

## 2019-07-06 DIAGNOSIS — Z85118 Personal history of other malignant neoplasm of bronchus and lung: Secondary | ICD-10-CM | POA: Diagnosis not present

## 2019-07-06 DIAGNOSIS — J439 Emphysema, unspecified: Secondary | ICD-10-CM | POA: Diagnosis not present

## 2019-07-06 DIAGNOSIS — R519 Headache, unspecified: Secondary | ICD-10-CM | POA: Diagnosis not present

## 2019-07-06 DIAGNOSIS — R531 Weakness: Secondary | ICD-10-CM | POA: Insufficient documentation

## 2019-07-06 LAB — COMPREHENSIVE METABOLIC PANEL
ALT: 11 U/L (ref 0–44)
AST: 33 U/L (ref 15–41)
Albumin: 3.2 g/dL — ABNORMAL LOW (ref 3.5–5.0)
Alkaline Phosphatase: 130 U/L — ABNORMAL HIGH (ref 38–126)
Anion gap: 18 — ABNORMAL HIGH (ref 5–15)
BUN: 23 mg/dL (ref 8–23)
CO2: 26 mmol/L (ref 22–32)
Calcium: 8.8 mg/dL — ABNORMAL LOW (ref 8.9–10.3)
Chloride: 93 mmol/L — ABNORMAL LOW (ref 98–111)
Creatinine, Ser: 1.01 mg/dL (ref 0.61–1.24)
GFR calc Af Amer: >60 mL/min (ref >60)
GFR calc non Af Amer: >60 mL/min (ref >60)
Glucose, Bld: 146 mg/dL — ABNORMAL HIGH (ref 70–99)
Potassium: 3.1 mmol/L — ABNORMAL LOW (ref 3.5–5.1)
Sodium: 137 mmol/L (ref 135–145)
Total Bilirubin: 1.7 mg/dL — ABNORMAL HIGH (ref 0.3–1.2)
Total Protein: 7.3 g/dL (ref 6.5–8.1)

## 2019-07-06 LAB — URINALYSIS, COMPLETE (UACMP) WITH MICROSCOPIC
Bacteria, UA: NONE SEEN
Bilirubin Urine: NEGATIVE
Glucose, UA: NEGATIVE mg/dL
Ketones, ur: 20 mg/dL — AB
Leukocytes,Ua: NEGATIVE
Nitrite: NEGATIVE
Protein, ur: 100 mg/dL — AB
RBC / HPF: >50 RBC/hpf — ABNORMAL HIGH (ref 0–5)
Specific Gravity, Urine: 1.019 (ref 1.005–1.030)
pH: 6 (ref 5.0–8.0)

## 2019-07-06 LAB — CBC
HCT: 26.7 % — ABNORMAL LOW (ref 39.0–52.0)
Hemoglobin: 8.2 g/dL — ABNORMAL LOW (ref 13.0–17.0)
MCH: 29.5 pg (ref 26.0–34.0)
MCHC: 30.7 g/dL (ref 30.0–36.0)
MCV: 96 fL (ref 80.0–100.0)
Platelets: 184 10*3/uL (ref 150–400)
RBC: 2.78 MIL/uL — ABNORMAL LOW (ref 4.22–5.81)
RDW: 18.4 % — ABNORMAL HIGH (ref 11.5–15.5)
WBC: 6.7 10*3/uL (ref 4.0–10.5)
nRBC: 0 % (ref 0.0–0.2)

## 2019-07-06 LAB — AMMONIA: Ammonia: 28 umol/L (ref 9–35)

## 2019-07-06 LAB — CBG MONITORING, ED: Glucose-Capillary: 133 mg/dL — ABNORMAL HIGH (ref 70–99)

## 2019-07-06 LAB — TROPONIN I (HIGH SENSITIVITY): Troponin I (High Sensitivity): 16 ng/L (ref <18)

## 2019-07-06 MED ORDER — MORPHINE SULFATE (PF) 4 MG/ML IV SOLN
4.0000 mg | Freq: Once | INTRAVENOUS | Status: AC
  Administered 2019-07-06: 4 mg via INTRAVENOUS
  Filled 2019-07-06: qty 1

## 2019-07-06 MED ORDER — HEPARIN SOD (PORK) LOCK FLUSH 100 UNIT/ML IV SOLN
500.0000 [IU] | Freq: Once | INTRAVENOUS | Status: AC
  Administered 2019-07-06: 500 [IU]
  Filled 2019-07-06: qty 5

## 2019-07-06 MED ORDER — SODIUM CHLORIDE 0.9 % IV SOLN: INTRAVENOUS | Status: DC

## 2019-07-06 MED ORDER — ONDANSETRON 8 MG PO TBDP: 8.0000 mg | ORAL_TABLET | Freq: Three times a day (TID) | ORAL | 0 refills | Status: AC | PRN

## 2019-07-06 MED ORDER — SODIUM CHLORIDE 0.9% FLUSH
3.0000 mL | Freq: Once | INTRAVENOUS | Status: AC
  Administered 2019-07-06: 3 mL via INTRAVENOUS

## 2019-07-06 MED ORDER — SODIUM CHLORIDE 0.9 % IV BOLUS
1000.0000 mL | Freq: Once | INTRAVENOUS | Status: AC
  Administered 2019-07-06: 1000 mL via INTRAVENOUS

## 2019-07-06 NOTE — Progress Notes (Signed)
Subjective: Seth [name] is a 74 y.o. male patient seen today for at risk foot care. Patient has h/o PAD and painful mycotic nails b/l that are difficult to trim. Pain interferes with ambulation. Aggravating factors include wearing enclosed shoe gear. Pain is relieved with periodic professional debridement  Patient Active Problem List   Diagnosis Date Noted  . Bone metastases ([place]) 06/26/2019  . Pain from bone metastases ([place]) 06/26/2019  . Hypomagnesemia 06/01/2019  . Hypokalemia 06/01/2019  . Constipation 06/01/2019  . Port-A-Cath in place 04/21/2019  . Dizziness 04/19/2019  . Anemia 04/19/2019  . Severe anemia 04/19/2019  . Hypotension 04/18/2019  . Elevated troponin level 04/18/2019  . Elevated troponin 04/18/2019  . Adenocarcinoma of left lung, stage 4 ([place]) 02/03/2019  . Encounter for antineoplastic immunotherapy 02/03/2019  . Frequent nosebleeds 05/28/2018  . Encounter for antineoplastic chemotherapy 04/18/2018  . Goals of care, counseling/discussion 04/18/2018  . Adenocarcinoma of left lung, stage 2 ([place]) 03/27/2018  . Abnormal PET of left lung   . S/P bronchoscopy   . Solitary lung nodule 02/20/2018  . Hilar adenopathy 02/20/2018  . Abnormal findings on diagnostic imaging of lung 02/20/2018  . Platelet inhibition due to Plavix 02/20/2018  . PVD (peripheral vascular disease) ([place]) 10/15/2013  . Tobacco use disorder 10/15/2013  . Pure hypercholesterolemia 10/15/2013  . Coronary artery disease involving native coronary artery of native heart without angina pectoris 10/15/2013  . Cataract 09/29/2013  . Colon cancer screening 09/25/2013  . AAA (abdominal aortic aneurysm) without rupture ([place]) 10/22/2012  . Nicotine addiction 10/17/2012  . Atherosclerosis of native arteries of the extremities with ulceration(440.23) 10/17/2012  . Carotid bruit 10/17/2012  . Other nonspecific abnormal cardiovascular system function study 04/10/2012  . History of sciatica   . Essential  hypertension, benign   . Seasonal allergies     Current Facility-Administered Medications on File Prior to Visit  Medication Dose Route Frequency Provider Last Rate Last Admin  . heparin lock flush 100 unit/mL  500 Units Intravenous Once Curt Bears, MD      . sodium chloride flush (NS) 0.9 % injection 10 mL  10 mL Intravenous PRN Curt Bears, MD   10 mL at 04/21/19 1306  . sodium chloride flush (NS) 0.9 % injection 3 mL  3 mL Intracatheter PRN Curt Bears, MD       Current Outpatient Medications on File Prior to Visit  Medication Sig Dispense Refill  . acetaminophen (TYLENOL) 325 MG tablet Take 650 mg by mouth daily as needed for mild pain.    Marland Kitchen atorvastatin (LIPITOR) 40 MG tablet TAKE 1 TABLET BY MOUTH EVERY DAY (Patient taking differently: Take 40 mg by mouth daily. ) 90 tablet 3  . cetirizine (ZYRTEC) 10 MG tablet Take 10 mg by mouth daily as needed for allergies.     Marland Kitchen clopidogrel (PLAVIX) 75 MG tablet TAKE 1 TABLET BY MOUTH EVERY DAY (Patient taking differently: Take 75 mg by mouth daily. ) 90 tablet 0  . folic acid (FOLVITE) 1 MG tablet TAKE 1 TABLET BY MOUTH EVERY DAY (Patient taking differently: Take 1 mg by mouth daily. ) 90 tablet 1  . magnesium oxide (MAG-OX) 400 (241.3 Mg) MG tablet Take 1 tablet (400 mg total) by mouth 2 (two) times daily. 60 tablet 0  . methylPREDNISolone (MEDROL DOSEPAK) 4 MG TBPK tablet Use as instructed.  Stop immediately if you develop any allergic reaction. (Patient not taking: Reported on 07/06/2019) 21 tablet 0  . metoprolol tartrate (LOPRESSOR) 25 MG  tablet Take 1 tablet (25 mg total) by mouth 2 (two) times daily. Only take if systolic blood pressure is >110 180 tablet 3  . oxyCODONE-acetaminophen (PERCOCET/ROXICET) 5-325 MG tablet Take 1 tablet by mouth every 8 (eight) hours as needed for severe pain. 30 tablet 0  . polyethylene glycol (MIRALAX) 17 g packet Take 17 g by mouth daily as needed for moderate constipation. 14 each 0  . Potassium  Chloride ER 20 MEQ TBCR Take 20 mEq by mouth daily. 7 tablet 0  . prochlorperazine (COMPAZINE) 10 MG tablet Take 1 tablet (10 mg total) by mouth every 6 (six) hours as needed for nausea or vomiting. (Patient not taking: Reported on 07/06/2019) 30 tablet 0    Allergies  Allergen Reactions  . Biaxin [Clarithromycin] Shortness Of Breath  . Clarithromycin Shortness Of Breath  . Iodinated Diagnostic Agents Hives  . Prednisone Other (See Comments)    Patient states he turns REALLY RED all over.  . Cortisone Other (See Comments)    Turns red from breast up  . Flomax [Tamsulosin Hcl]     Blurred vision and lower back pain   . Hepatitis B Virus Vaccines     Nerve problems States his previous doctor told him it was due to this   . Iodine Hives    Dye from nuclear medicine test    Objective: Physical Exam  General: 74 y.o. Caucasian male  In no acute distress. Awake, alert and oriented x 3  Neurovascular status unchanged b/l.  Capillary refill time to digits <4 seconds b/l. Nonpalpable DP pulse(s) left foot, right foot, left lower extremity and right lower extremity. Nonpalpable PT pulse(s) left lower extremity and right lower extremity. Pedal hair absent b/l Skin temperature gradient warm to cool b/l. Dependent rubor noted b/l. +2 pitting edema left foot and right foot.  Protective sensation intact 5/5 intact bilaterally with 10g monofilament b/l. Vibratory sensation diminished b/l.  Dermatological:  Pedal skin is thin shiny, atrophic bilaterally. No open wounds bilaterally. No interdigital macerations bilaterally. Toenails 1-5 b/l elongated, discolored, dystrophic, thickened, crumbly with subungual debris and tenderness to dorsal palpation.  Musculoskeletal:  Normal muscle strength 5/5 to all lower extremity muscle groups bilaterally. No pain crepitus or joint limitation noted with ROM b/l.  Assessment and Plan:  1. Pain due to onychomycosis of toenails of both feet   2. Peripheral  vascular disease with claudication ([place])    -Examined patient. -No new findings. No new orders. -Toenails 1-5 b/l were debrided in length and girth with sterile nail nippers and dremel without iatrogenic bleeding.  -Patient to continue soft, supportive shoe gear daily. -Patient to report any pedal injuries to medical professional immediately. -Patient/POA to call should there be question/concern in the interim.  Return in about 3 months (around 09/30/2019) for nail trim.  Marzetta Board, DPM

## 2019-07-06 NOTE — ED Notes (Signed)
Wife at bedside.

## 2019-07-06 NOTE — Discharge Instructions (Addendum)
Drink orange juice, eat bananas and potatoes to help with your potassium.  Continue your pain meds.  Take the medications as needed for nausea.

## 2019-07-06 NOTE — ED Triage Notes (Signed)
Family reports patient is cancer patient and getting palliative radiation. Had change in cognition today and family was wanting to get him into hospice but Cancer Doctor advised evaluation at ED for new change in cognition.

## 2019-07-06 NOTE — ED Provider Notes (Signed)
[facility] DEPT Provider Note   CSN: [id number] Arrival date & time: 07/06/19  1658     History Chief Complaint  Patient presents with  . Altered Mental Status    Seth [name] is a 74 y.o. male.  HPI   Patient presents to the ED for evaluation of increasing weakness, decreased appetite and confusion.  Patient has a history of metastatic lung cancer.  He is undergoing palliative radiation treatment.  Patient has had decreased p.o. intake over the last week.  He has not been eating much of anything at all.  He has been getting weaker.  Wife states over the last couple of days he has been confused.  She feels that he has been seeing things that are not there.  Patient disagrees.  He thinks he was just hearing the TV.  They called the oncology doctor thinking he might need hospice care and they recommended ED evaluation.  He denies any fevers or chills.  He does have vomiting but that is not a new issue.  He denies any diarrhea.  No chest pain.  No abdominal pain.  No headache.  Past Medical History:  Diagnosis Date  . AAA (abdominal aortic aneurysm) ([place])   . Allergic rhinitis   . Anxiety   . Back pain   . CAD (coronary artery disease)   . Cataracts, bilateral   . Emphysema, unspecified ([place])     " moderate"  . Essential hypertension, benign   . GERD (gastroesophageal reflux disease)   . Gout   . Headache    migraines  . Hilar adenopathy   . History of hiatal hernia   . History of sciatica   . Hyperlipidemia   . lung ca dx'd 02/2018  . Lung nodule   . Pneumonia    as child  . Prostate hypertrophy   . PVD (peripheral vascular disease) ([place])   . Seasonal allergies   . Subclavian steal syndrome of left subclavian artery   . Wears dentures     Patient Active Problem List   Diagnosis Date Noted  . Bone metastases ([place]) 06/26/2019  . Pain from bone metastases ([place]) 06/26/2019  . Hypomagnesemia 06/01/2019  . Hypokalemia 06/01/2019  .  Constipation 06/01/2019  . Port-A-Cath in place 04/21/2019  . Dizziness 04/19/2019  . Anemia 04/19/2019  . Severe anemia 04/19/2019  . Hypotension 04/18/2019  . Elevated troponin level 04/18/2019  . Elevated troponin 04/18/2019  . Adenocarcinoma of left lung, stage 4 ([place]) 02/03/2019  . Encounter for antineoplastic immunotherapy 02/03/2019  . Frequent nosebleeds 05/28/2018  . Encounter for antineoplastic chemotherapy 04/18/2018  . Goals of care, counseling/discussion 04/18/2018  . Adenocarcinoma of left lung, stage 2 ([place]) 03/27/2018  . Abnormal PET of left lung   . S/P bronchoscopy   . Solitary lung nodule 02/20/2018  . Hilar adenopathy 02/20/2018  . Abnormal findings on diagnostic imaging of lung 02/20/2018  . Platelet inhibition due to Plavix 02/20/2018  . PVD (peripheral vascular disease) ([place]) 10/15/2013  . Tobacco use disorder 10/15/2013  . Pure hypercholesterolemia 10/15/2013  . Coronary artery disease involving native coronary artery of native heart without angina pectoris 10/15/2013  . Cataract 09/29/2013  . Colon cancer screening 09/25/2013  . AAA (abdominal aortic aneurysm) without rupture ([place]) 10/22/2012  . Nicotine addiction 10/17/2012  . Atherosclerosis of native arteries of the extremities with ulceration(440.23) 10/17/2012  . Carotid bruit 10/17/2012  . Other nonspecific abnormal cardiovascular system function study 04/10/2012  . History of sciatica   .  Essential hypertension, benign   . Seasonal allergies     Past Surgical History:  Procedure Laterality Date  . CARDIAC CATHETERIZATION     stent placement  . COLONOSCOPY W/ BIOPSIES AND POLYPECTOMY    . ILIAC ARTERY STENT    . IR IMAGING GUIDED PORT INSERTION  03/23/2019  . MULTIPLE TOOTH EXTRACTIONS    . VIDEO BRONCHOSCOPY WITH ENDOBRONCHIAL NAVIGATION N/A 02/26/2018   Procedure: VIDEO BRONCHOSCOPY WITH ENDOBRONCHIAL NAVIGATION;  Surgeon: Garner Nash, DO;  Location: [facility];  Service: Thoracic;   Laterality: N/A;  . VIDEO BRONCHOSCOPY WITH ENDOBRONCHIAL NAVIGATION N/A 03/19/2018   Procedure: VIDEO BRONCHOSCOPY WITH ENDOBRONCHIAL NAVIGATION and endobronchial ultrasound;  Surgeon: Garner Nash, DO;  Location: [facility];  Service: Thoracic;  Laterality: N/A;  . VIDEO BRONCHOSCOPY WITH ENDOBRONCHIAL ULTRASOUND N/A 02/26/2018   Procedure: VIDEO BRONCHOSCOPY WITH ENDOBRONCHIAL ULTRASOUND;  Surgeon: Garner Nash, DO;  Location: [facility];  Service: Thoracic;  Laterality: N/A;  . VIDEO BRONCHOSCOPY WITH ENDOBRONCHIAL ULTRASOUND N/A 03/19/2018   Procedure: NAVIGATION BRONCHOSCOPY;  Surgeon: Garner Nash, DO;  Location: [facility];  Service: Thoracic;  Laterality: N/A;       Family History  Problem Relation Age of Onset  . Hypertension Mother   . Heart disease Mother   . Stroke Mother   . Heart disease Paternal Grandmother   . Diabetes Paternal Grandmother   . Hyperlipidemia Paternal Grandmother   . Diabetes Sister   . Heart disease Sister   . Hyperlipidemia Sister   . Heart disease Brother   . Hyperlipidemia Brother   . Hypertension Brother   . Diabetes Paternal Grandfather   . Heart disease Paternal Grandfather   . Hyperlipidemia Paternal Grandfather   . Heart disease Maternal Uncle        x 2  . Cancer Maternal Grandmother        type unknown, ? lung  . Leukemia Maternal Uncle     Social History   Tobacco Use  . Smoking status: Former Smoker    Packs/day: 3.00    Years: 50.00    Pack years: 150.00    Types: Cigarettes    Quit date: 04/05/2016    Years since quitting: 3.2  . Smokeless tobacco: Never Used  Substance Use Topics  . Alcohol use: Yes    Alcohol/week: 0.0 standard drinks    Comment: rare  . Drug use: No    Home Medications Prior to Admission medications   Medication Sig Start Date End Date Taking? Authorizing Provider  acetaminophen (TYLENOL) 325 MG tablet Take 650 mg by mouth daily as needed for mild pain.   Yes [provider]  atorvastatin  (LIPITOR) 40 MG tablet TAKE 1 TABLET BY MOUTH EVERY DAY Patient taking differently: Take 40 mg by mouth daily.  04/24/19  Yes Turner, Eber Hong, MD  cetirizine (ZYRTEC) 10 MG tablet Take 10 mg by mouth daily as needed for allergies.    Yes [provider]  clopidogrel (PLAVIX) 75 MG tablet TAKE 1 TABLET BY MOUTH EVERY DAY Patient taking differently: Take 75 mg by mouth daily.  05/18/19  Yes [name], MD  folic acid (FOLVITE) 1 MG tablet TAKE 1 TABLET BY MOUTH EVERY DAY Patient taking differently: Take 1 mg by mouth daily.  05/04/19  Yes Curt Bears, MD  magnesium oxide (MAG-OX) 400 (241.3 Mg) MG tablet Take 1 tablet (400 mg total) by mouth 2 (two) times daily. 04/20/19  Yes Pokhrel, Corrie Mckusick, MD  oxyCODONE-acetaminophen (PERCOCET/ROXICET) 5-325  MG tablet Take 1 tablet by mouth every 8 (eight) hours as needed for severe pain. 06/22/19  Yes Curt Bears, MD  polyethylene glycol (MIRALAX) 17 g packet Take 17 g by mouth daily as needed for moderate constipation. 04/20/19  Yes Pokhrel, Laxman, MD  Potassium Chloride ER 20 MEQ TBCR Take 20 mEq by mouth daily. 06/01/19  Yes Heilingoetter, Cassandra L, PA-C  methylPREDNISolone (MEDROL DOSEPAK) 4 MG TBPK tablet Use as instructed.  Stop immediately if you develop any allergic reaction. Patient not taking: Reported on 07/06/2019 06/22/19   Curt Bears, MD  metoprolol tartrate (LOPRESSOR) 25 MG tablet Take 1 tablet (25 mg total) by mouth 2 (two) times daily. Only take if systolic blood pressure is >110 04/06/19   Sueanne Margarita, MD  ondansetron (ZOFRAN ODT) 8 MG disintegrating tablet Take 1 tablet (8 mg total) by mouth every 8 (eight) hours as needed for nausea or vomiting. 07/06/19   Dorie Rank, MD  prochlorperazine (COMPAZINE) 10 MG tablet Take 1 tablet (10 mg total) by mouth every 6 (six) hours as needed for nausea or vomiting. Patient not taking: Reported on 07/06/2019 02/03/19   Curt Bears, MD    Allergies    Biaxin  [clarithromycin], Clarithromycin, Iodinated diagnostic agents, Prednisone, Cortisone, Flomax [tamsulosin hcl], Hepatitis b virus vaccines, and Iodine  Review of Systems   Review of Systems  All other systems reviewed and are negative.   Physical Exam Updated Vital Signs BP 116/69   Pulse (!) 105   Temp 98 F (36.7 C) (Oral)   Resp 16   SpO2 92%   Physical Exam Vitals and nursing note reviewed.  Constitutional:      General: He is not in acute distress.    Appearance: He is well-developed.  HENT:     Head: Normocephalic and atraumatic.     Right Ear: External ear normal.     Left Ear: External ear normal.  Eyes:     General: No scleral icterus.       Right eye: No discharge.        Left eye: No discharge.     Conjunctiva/sclera: Conjunctivae normal.  Neck:     Trachea: No tracheal deviation.  Cardiovascular:     Rate and Rhythm: Regular rhythm. Tachycardia present.  Pulmonary:     Effort: Pulmonary effort is normal. No respiratory distress.     Breath sounds: Normal breath sounds. No stridor. No wheezing or rales.  Abdominal:     General: Bowel sounds are normal. There is no distension.     Palpations: Abdomen is soft.     Tenderness: There is no abdominal tenderness. There is no guarding or rebound.  Musculoskeletal:        General: No tenderness.     Cervical back: Neck supple.  Skin:    General: Skin is warm and dry.     Findings: No rash.  Neurological:     Mental Status: He is alert and oriented to person, place, and time.     Cranial Nerves: No cranial nerve deficit (no facial droop, extraocular movements intact, no slurred speech).     Sensory: No sensory deficit.     Motor: Weakness present. No abnormal muscle tone or seizure activity.     Coordination: Coordination normal.     Comments: Patient has generalized weakness but he can lift his arms and legs off the bed, he is alert and answering questions appropriately     ED Results / Procedures /  Treatments   Labs (all labs ordered are listed, but only abnormal results are displayed) Labs Reviewed  COMPREHENSIVE METABOLIC PANEL - Abnormal; Notable for the following components:      Result Value   Potassium 3.1 (*)    Chloride 93 (*)    Glucose, Bld 146 (*)    Calcium 8.8 (*)    Albumin 3.2 (*)    Alkaline Phosphatase 130 (*)    Total Bilirubin 1.7 (*)    Anion gap 18 (*)    All other components within normal limits  CBC - Abnormal; Notable for the following components:   RBC 2.78 (*)    Hemoglobin 8.2 (*)    HCT 26.7 (*)    RDW 18.4 (*)    All other components within normal limits  URINALYSIS, COMPLETE (UACMP) WITH MICROSCOPIC - Abnormal; Notable for the following components:   Color, Urine AMBER (*)    Hgb urine dipstick LARGE (*)    Ketones, ur 20 (*)    Protein, ur 100 (*)    RBC / HPF >50 (*)    All other components within normal limits  CBG MONITORING, ED - Abnormal; Notable for the following components:   Glucose-Capillary 133 (*)    All other components within normal limits  AMMONIA  CBG MONITORING, ED  TROPONIN I (HIGH SENSITIVITY)    EKG Sinus tachycardia rate 133 Right axis deviation Diffuse ST depression No prior EKG for comparison  Radiology CT Head Wo Contrast  Result Date: 07/06/2019 CLINICAL DATA:  Oncology patient getting palliative radiation. Change in cognition. Headache. EXAM: CT HEAD WITHOUT CONTRAST TECHNIQUE: Contiguous axial images were obtained from the base of the skull through the vertex without intravenous contrast. COMPARISON:  CT head 06/09/2019 FINDINGS: Brain: No evidence of acute infarction, hemorrhage, hydrocephalus, extra-axial collection or mass lesion/mass effect. Global atrophy and periventricular high white matter hypodensity consistent with chronic small vessel ischemic change. Vascular: No hyperdense vessel or unexpected calcification. Skull: Normal. Negative for fracture or focal lesion. Sinuses/Orbits: No acute finding.  Partially opacified right mastoid air cells. Other: None. IMPRESSION: No acute intracranial process. Electronically Signed   By: Audie Pinto M.D.   On: 07/06/2019 19:31   DG Chest Portable 1 View  Result Date: 07/06/2019 CLINICAL DATA:  Weakness. Altered mental status. Personal history of lung carcinoma. EXAM: PORTABLE CHEST 1 VIEW COMPARISON:  04/18/2019 FINDINGS: Heart size is stable. Right-sided power port remains in appropriate position. Pulmonary emphysema again seen. Left lung volume loss and pleural-parenchymal scarring in the left midlung shows no significant change. A no new or worsening areas of pulmonary opacity are seen. No evidence of pleural effusion. IMPRESSION: Stable left lung scarring and volume loss. Emphysema. No acute findings. Electronically Signed   By: Marlaine Hind M.D.   On: 07/06/2019 18:21    Procedures Procedures (including critical care time)  Medications Ordered in ED Medications  sodium chloride 0.9 % bolus 1,000 mL (0 mLs Intravenous Stopped 07/06/19 1850)    And  0.9 %  sodium chloride infusion ( Intravenous New Bag/Given (Non-Interop) 07/06/19 2041)  sodium chloride flush (NS) 0.9 % injection 3 mL (3 mLs Intravenous Given 07/06/19 1757)  sodium chloride 0.9 % bolus 1,000 mL (0 mLs Intravenous Stopped 07/06/19 2040)  morphine 4 MG/ML injection 4 mg (4 mg Intravenous Given 07/06/19 1916)  morphine 4 MG/ML injection 4 mg (4 mg Intravenous Given 07/06/19 2123)    ED Course  I have reviewed the triage vital signs and the nursing notes.  Pertinent labs & imaging results that were available during my care of the patient were reviewed by me and considered in my medical decision making (see chart for details).  Clinical Course as of Jul 06 2222  Mon Jul 06, 2019  1830 Hemoglobin decreased but this is stable compared to previous values   [JK]  1831 Lft   [JK]  2108 Hematuria noted on ua.     [JK]  2108 CT negative   [JK]  2108 CXR negative   [JK]      Clinical Course User Index [JK] Dorie Rank, MD   MDM Rules/Calculators/A&P                      Pt presented with weakness, decreased po intake, dehydration.  Patient's ED work-up is reassuring.  Laboratory test do not show any signs of renal failure.  Patient's anemia stable.  Patient has some mild persistent abnormalities in his LFTs but not significantly changed.  Urinalysis does not show infection.  CT scan of his head did not show any signs of metastases.  Chest x-ray negative.  Patient was treated with IV fluids.  He was also given pain medications.  His heart rate improved.  Discussed treatment options with patient and family.  They are interested in hospice.  Right now he feels comfortable with discharge.  They do feel like they can contact Dr. Earlie Server tomorrow to arrange for possible home hospice assistance.  Final Clinical Impression(s) / ED Diagnoses Final diagnoses:  Metastatic malignant neoplasm, unspecified site [facility])    Rx / DC Orders ED Discharge Orders         Ordered    ondansetron (ZOFRAN ODT) 8 MG disintegrating tablet  Every 8 hours PRN     07/06/19 2224           Dorie Rank, MD 07/06/19 2226

## 2019-07-07 ENCOUNTER — Telehealth: Payer: Self-pay | Admitting: Medical Oncology

## 2019-07-07 ENCOUNTER — Ambulatory Visit

## 2019-07-07 NOTE — Telephone Encounter (Signed)
F/U on call note : confusion , dehydrated-received IVF Today pt is speaking to me ,but voice is very weak. He wants Hospice.  Referral made and Dr Julien Nordmann will be attending.

## 2019-07-08 ENCOUNTER — Ambulatory Visit

## 2019-07-08 ENCOUNTER — Other Ambulatory Visit: Payer: Self-pay | Admitting: Internal Medicine

## 2019-07-08 ENCOUNTER — Telehealth: Payer: Self-pay | Admitting: Medical Oncology

## 2019-07-08 ENCOUNTER — Other Ambulatory Visit: Payer: Self-pay | Admitting: Medical Oncology

## 2019-07-08 MED ORDER — OXYCODONE-ACETAMINOPHEN 5-325 MG PO TABS: 1.0000 | ORAL_TABLET | Freq: Three times a day (TID) | ORAL | 0 refills | Status: AC | PRN

## 2019-07-08 NOTE — Telephone Encounter (Signed)
Requested refill for oxycodone.

## 2019-07-08 NOTE — Telephone Encounter (Signed)
Spoke to Pts wife Seth [name] and I advised her to contact the insurance company/Hospice to see what medication is closes to plavix that could be covered and then to contact us back here at the office.

## 2019-07-08 NOTE — Telephone Encounter (Signed)
Pt wife called and Ask if  you can Change  plavix to Something that  will be coved  by Hospice # [phone] please Advice

## 2019-07-09 ENCOUNTER — Other Ambulatory Visit: Payer: Self-pay

## 2019-07-09 ENCOUNTER — Ambulatory Visit
Admission: RE | Admit: 2019-07-09 | Discharge: 2019-07-09 | Disposition: A | Discharge status: Home / Self Care | Source: Ambulatory Visit | Attending: Radiation Oncology | Admitting: Radiation Oncology

## 2019-07-09 DIAGNOSIS — C3492 Malignant neoplasm of unspecified part of left bronchus or lung: Secondary | ICD-10-CM | POA: Diagnosis not present

## 2019-07-09 DIAGNOSIS — Z681 Body mass index (BMI) 19 or less, adult: Secondary | ICD-10-CM | POA: Diagnosis not present

## 2019-07-09 DIAGNOSIS — C3412 Malignant neoplasm of upper lobe, left bronchus or lung: Secondary | ICD-10-CM | POA: Diagnosis not present

## 2019-07-09 DIAGNOSIS — C7951 Secondary malignant neoplasm of bone: Secondary | ICD-10-CM | POA: Diagnosis not present

## 2019-07-09 DIAGNOSIS — Z510 Encounter for antineoplastic radiation therapy: Secondary | ICD-10-CM | POA: Insufficient documentation

## 2019-07-09 DIAGNOSIS — I10 Essential (primary) hypertension: Secondary | ICD-10-CM | POA: Diagnosis not present

## 2019-07-09 DIAGNOSIS — J302 Other seasonal allergic rhinitis: Secondary | ICD-10-CM | POA: Diagnosis not present

## 2019-07-09 DIAGNOSIS — C7970 Secondary malignant neoplasm of unspecified adrenal gland: Secondary | ICD-10-CM | POA: Diagnosis not present

## 2019-07-10 ENCOUNTER — Other Ambulatory Visit: Payer: Self-pay

## 2019-07-10 ENCOUNTER — Ambulatory Visit
Admission: RE | Admit: 2019-07-10 | Discharge: 2019-07-10 | Disposition: A | Discharge status: Home / Self Care | Source: Ambulatory Visit | Attending: Radiation Oncology | Admitting: Radiation Oncology

## 2019-07-10 DIAGNOSIS — Z510 Encounter for antineoplastic radiation therapy: Secondary | ICD-10-CM | POA: Diagnosis not present

## 2019-07-10 DIAGNOSIS — C3492 Malignant neoplasm of unspecified part of left bronchus or lung: Secondary | ICD-10-CM | POA: Diagnosis not present

## 2019-07-10 DIAGNOSIS — C7951 Secondary malignant neoplasm of bone: Secondary | ICD-10-CM | POA: Diagnosis not present

## 2019-07-10 DIAGNOSIS — J302 Other seasonal allergic rhinitis: Secondary | ICD-10-CM | POA: Diagnosis not present

## 2019-07-10 DIAGNOSIS — I10 Essential (primary) hypertension: Secondary | ICD-10-CM | POA: Diagnosis not present

## 2019-07-10 DIAGNOSIS — C7970 Secondary malignant neoplasm of unspecified adrenal gland: Secondary | ICD-10-CM | POA: Diagnosis not present

## 2019-07-10 DIAGNOSIS — Z681 Body mass index (BMI) 19 or less, adult: Secondary | ICD-10-CM | POA: Diagnosis not present

## 2019-07-13 ENCOUNTER — Other Ambulatory Visit: Payer: Medicare Other

## 2019-07-13 ENCOUNTER — Ambulatory Visit

## 2019-07-13 ENCOUNTER — Ambulatory Visit
Admission: RE | Admit: 2019-07-13 | Discharge: 2019-07-13 | Disposition: A | Discharge status: Home / Self Care | Source: Ambulatory Visit | Attending: Radiation Oncology | Admitting: Radiation Oncology

## 2019-07-13 ENCOUNTER — Ambulatory Visit: Payer: Medicare Other

## 2019-07-13 ENCOUNTER — Encounter: Payer: Self-pay | Admitting: Radiation Oncology

## 2019-07-13 ENCOUNTER — Ambulatory Visit: Payer: Medicare Other | Admitting: Physician Assistant

## 2019-07-13 ENCOUNTER — Other Ambulatory Visit: Payer: Self-pay

## 2019-07-13 DIAGNOSIS — C7970 Secondary malignant neoplasm of unspecified adrenal gland: Secondary | ICD-10-CM | POA: Diagnosis not present

## 2019-07-13 DIAGNOSIS — Z681 Body mass index (BMI) 19 or less, adult: Secondary | ICD-10-CM | POA: Diagnosis not present

## 2019-07-13 DIAGNOSIS — C3492 Malignant neoplasm of unspecified part of left bronchus or lung: Secondary | ICD-10-CM | POA: Diagnosis not present

## 2019-07-13 DIAGNOSIS — J302 Other seasonal allergic rhinitis: Secondary | ICD-10-CM | POA: Diagnosis not present

## 2019-07-13 DIAGNOSIS — C7951 Secondary malignant neoplasm of bone: Secondary | ICD-10-CM | POA: Diagnosis not present

## 2019-07-13 DIAGNOSIS — Z510 Encounter for antineoplastic radiation therapy: Secondary | ICD-10-CM | POA: Diagnosis not present

## 2019-07-13 DIAGNOSIS — I10 Essential (primary) hypertension: Secondary | ICD-10-CM | POA: Diagnosis not present

## 2019-07-14 ENCOUNTER — Other Ambulatory Visit: Payer: Self-pay | Admitting: Family Medicine

## 2019-07-14 ENCOUNTER — Ambulatory Visit

## 2019-07-14 ENCOUNTER — Telehealth: Payer: Self-pay | Admitting: *Deleted

## 2019-07-14 DIAGNOSIS — I739 Peripheral vascular disease, unspecified: Secondary | ICD-10-CM

## 2019-07-14 NOTE — Telephone Encounter (Signed)
Returned patient's phone call, lvm for a return call 

## 2019-07-14 NOTE — Telephone Encounter (Signed)
Requested Prescriptions  Pending Prescriptions Disp Refills  . clopidogrel (PLAVIX) 75 MG tablet [Pharmacy Med Name: CLOPIDOGREL 75 MG TABLET] 90 tablet 0    Sig: TAKE 1 TABLET BY MOUTH EVERY DAY     Hematology: Antiplatelets - clopidogrel Failed - 07/14/2019 12:34 PM      Failed - Evaluate AST, ALT within 2 months of therapy initiation.      Failed - HCT in normal range and within 180 days    HCT  Date Value Ref Range Status  07/06/2019 26.7 (L) 39.0 - 52.0 % Final         Failed - HGB in normal range and within 180 days    Hemoglobin  Date Value Ref Range Status  07/06/2019 8.2 (L) 13.0 - 17.0 g/dL Final  06/25/2019 8.8 (L) 13.0 - 17.0 g/dL Final         Passed - ALT in normal range and within 360 days    ALT  Date Value Ref Range Status  07/06/2019 11 0 - 44 U/L Final  06/25/2019 9 0 - 44 U/L Final         Passed - AST in normal range and within 360 days    AST  Date Value Ref Range Status  07/06/2019 33 15 - 41 U/L Final  06/25/2019 42 (H) 15 - 41 U/L Final         Passed - PLT in normal range and within 180 days    Platelets  Date Value Ref Range Status  07/06/2019 184 150 - 400 K/uL Final   Platelet Count  Date Value Ref Range Status  06/25/2019 180 150 - 400 K/uL Final   Platelet Count, POC  Date Value Ref Range Status  12/24/2017 197 142 - 424 K/uL Final         Passed - Valid encounter within last 6 months    Recent Outpatient Visits          3 months ago Essential hypertension   [facility], [name], MD   3 months ago Essential hypertension   [facility], [name], MD   3 months ago Essential hypertension   [facility], [name], MD   3 months ago Abrasion of lower extremity, unspecified laterality, initial encounter   [facility], [name], MD   4 months ago Pedal edema   [facility], [name], MD

## 2019-07-15 ENCOUNTER — Ambulatory Visit

## 2019-07-16 ENCOUNTER — Ambulatory Visit

## 2019-07-16 DIAGNOSIS — C3492 Malignant neoplasm of unspecified part of left bronchus or lung: Secondary | ICD-10-CM | POA: Diagnosis not present

## 2019-07-16 DIAGNOSIS — Z681 Body mass index (BMI) 19 or less, adult: Secondary | ICD-10-CM | POA: Diagnosis not present

## 2019-07-16 DIAGNOSIS — C7951 Secondary malignant neoplasm of bone: Secondary | ICD-10-CM | POA: Diagnosis not present

## 2019-07-16 DIAGNOSIS — J302 Other seasonal allergic rhinitis: Secondary | ICD-10-CM | POA: Diagnosis not present

## 2019-07-16 DIAGNOSIS — C7970 Secondary malignant neoplasm of unspecified adrenal gland: Secondary | ICD-10-CM | POA: Diagnosis not present

## 2019-07-16 DIAGNOSIS — I10 Essential (primary) hypertension: Secondary | ICD-10-CM | POA: Diagnosis not present

## 2019-07-17 ENCOUNTER — Ambulatory Visit

## 2019-07-17 DIAGNOSIS — C7970 Secondary malignant neoplasm of unspecified adrenal gland: Secondary | ICD-10-CM | POA: Diagnosis not present

## 2019-07-17 DIAGNOSIS — C7951 Secondary malignant neoplasm of bone: Secondary | ICD-10-CM | POA: Diagnosis not present

## 2019-07-17 DIAGNOSIS — J302 Other seasonal allergic rhinitis: Secondary | ICD-10-CM | POA: Diagnosis not present

## 2019-07-17 DIAGNOSIS — I10 Essential (primary) hypertension: Secondary | ICD-10-CM | POA: Diagnosis not present

## 2019-07-17 DIAGNOSIS — Z681 Body mass index (BMI) 19 or less, adult: Secondary | ICD-10-CM | POA: Diagnosis not present

## 2019-07-17 DIAGNOSIS — C3492 Malignant neoplasm of unspecified part of left bronchus or lung: Secondary | ICD-10-CM | POA: Diagnosis not present

## 2019-07-20 ENCOUNTER — Ambulatory Visit

## 2019-07-21 ENCOUNTER — Ambulatory Visit

## 2019-07-22 ENCOUNTER — Ambulatory Visit

## 2019-07-22 ENCOUNTER — Telehealth: Payer: Self-pay | Admitting: Radiation Oncology

## 2019-07-22 DIAGNOSIS — J302 Other seasonal allergic rhinitis: Secondary | ICD-10-CM | POA: Diagnosis not present

## 2019-07-22 DIAGNOSIS — C7951 Secondary malignant neoplasm of bone: Secondary | ICD-10-CM | POA: Diagnosis not present

## 2019-07-22 DIAGNOSIS — Z681 Body mass index (BMI) 19 or less, adult: Secondary | ICD-10-CM | POA: Diagnosis not present

## 2019-07-22 DIAGNOSIS — C7970 Secondary malignant neoplasm of unspecified adrenal gland: Secondary | ICD-10-CM | POA: Diagnosis not present

## 2019-07-22 DIAGNOSIS — I10 Essential (primary) hypertension: Secondary | ICD-10-CM | POA: Diagnosis not present

## 2019-07-22 DIAGNOSIS — C3492 Malignant neoplasm of unspecified part of left bronchus or lung: Secondary | ICD-10-CM | POA: Diagnosis not present

## 2019-07-22 NOTE — Telephone Encounter (Addendum)
l called the patient's wife and confirmed he is in hospice care and not doing well. We will cancel remaining treatment at her request.

## 2019-07-23 ENCOUNTER — Ambulatory Visit

## 2019-07-23 DIAGNOSIS — C3492 Malignant neoplasm of unspecified part of left bronchus or lung: Secondary | ICD-10-CM | POA: Diagnosis not present

## 2019-07-23 DIAGNOSIS — Z681 Body mass index (BMI) 19 or less, adult: Secondary | ICD-10-CM | POA: Diagnosis not present

## 2019-07-23 DIAGNOSIS — C7951 Secondary malignant neoplasm of bone: Secondary | ICD-10-CM | POA: Diagnosis not present

## 2019-07-23 DIAGNOSIS — C7970 Secondary malignant neoplasm of unspecified adrenal gland: Secondary | ICD-10-CM | POA: Diagnosis not present

## 2019-07-23 DIAGNOSIS — I10 Essential (primary) hypertension: Secondary | ICD-10-CM | POA: Diagnosis not present

## 2019-07-23 DIAGNOSIS — J302 Other seasonal allergic rhinitis: Secondary | ICD-10-CM | POA: Diagnosis not present

## 2019-07-24 ENCOUNTER — Ambulatory Visit

## 2019-07-27 ENCOUNTER — Telehealth: Payer: Self-pay | Admitting: Medical Oncology

## 2019-07-27 DIAGNOSIS — I10 Essential (primary) hypertension: Secondary | ICD-10-CM | POA: Diagnosis not present

## 2019-07-27 DIAGNOSIS — C7951 Secondary malignant neoplasm of bone: Secondary | ICD-10-CM | POA: Diagnosis not present

## 2019-07-27 DIAGNOSIS — C7970 Secondary malignant neoplasm of unspecified adrenal gland: Secondary | ICD-10-CM | POA: Diagnosis not present

## 2019-07-27 DIAGNOSIS — C3492 Malignant neoplasm of unspecified part of left bronchus or lung: Secondary | ICD-10-CM | POA: Diagnosis not present

## 2019-07-27 DIAGNOSIS — Z681 Body mass index (BMI) 19 or less, adult: Secondary | ICD-10-CM | POA: Diagnosis not present

## 2019-07-27 DIAGNOSIS — J302 Other seasonal allergic rhinitis: Secondary | ICD-10-CM | POA: Diagnosis not present

## 2019-07-27 NOTE — Telephone Encounter (Signed)
Returned call from this am. I LVM to call me back.

## 2019-07-29 ENCOUNTER — Telehealth: Payer: Self-pay | Admitting: Medical Oncology

## 2019-07-29 DIAGNOSIS — C7951 Secondary malignant neoplasm of bone: Secondary | ICD-10-CM | POA: Diagnosis not present

## 2019-07-29 DIAGNOSIS — C3492 Malignant neoplasm of unspecified part of left bronchus or lung: Secondary | ICD-10-CM | POA: Diagnosis not present

## 2019-07-29 DIAGNOSIS — C7970 Secondary malignant neoplasm of unspecified adrenal gland: Secondary | ICD-10-CM | POA: Diagnosis not present

## 2019-07-29 DIAGNOSIS — I10 Essential (primary) hypertension: Secondary | ICD-10-CM | POA: Diagnosis not present

## 2019-07-29 DIAGNOSIS — Z681 Body mass index (BMI) 19 or less, adult: Secondary | ICD-10-CM | POA: Diagnosis not present

## 2019-07-29 DIAGNOSIS — J302 Other seasonal allergic rhinitis: Secondary | ICD-10-CM | POA: Diagnosis not present

## 2019-07-29 NOTE — Telephone Encounter (Signed)
Email for FMLA left on wifes mobile number. Marland Kitchen"[email or web address]"

## 2019-07-30 ENCOUNTER — Telehealth: Payer: Self-pay | Admitting: *Deleted

## 2019-07-30 DIAGNOSIS — C3492 Malignant neoplasm of unspecified part of left bronchus or lung: Secondary | ICD-10-CM | POA: Diagnosis not present

## 2019-07-30 DIAGNOSIS — Z681 Body mass index (BMI) 19 or less, adult: Secondary | ICD-10-CM | POA: Diagnosis not present

## 2019-07-30 DIAGNOSIS — I10 Essential (primary) hypertension: Secondary | ICD-10-CM | POA: Diagnosis not present

## 2019-07-30 DIAGNOSIS — J302 Other seasonal allergic rhinitis: Secondary | ICD-10-CM | POA: Diagnosis not present

## 2019-07-30 DIAGNOSIS — C7970 Secondary malignant neoplasm of unspecified adrenal gland: Secondary | ICD-10-CM | POA: Diagnosis not present

## 2019-07-30 DIAGNOSIS — C7951 Secondary malignant neoplasm of bone: Secondary | ICD-10-CM | POA: Diagnosis not present

## 2019-08-03 ENCOUNTER — Telehealth: Payer: Self-pay | Admitting: Medical Oncology

## 2019-08-03 NOTE — Telephone Encounter (Signed)
Pt died Aug 15, 2019.

## 2019-08-06 NOTE — Telephone Encounter (Signed)
Received call from [facility] stating pt died @ 05/04/26 today & wanted to make sure Dr Julien Nordmann knew.  Message sent to Dr Mohamed/Pod RN & Medical Records notified.

## 2019-08-06 DEATH — deceased

## 2019-08-14 ENCOUNTER — Telehealth: Payer: Self-pay | Admitting: Family Medicine

## 2019-09-13 NOTE — Progress Notes (Signed)
  Radiation Oncology         [phone] ________________________________  Name: Seth [name] MRN: [id number]  Date: 07/13/2019  DOB: Jul 04, 1945  End of Treatment Note  Diagnosis:   bone metastasis     Indication for treatment::  palliative       Radiation treatment dates:   06/29/19 - 07/13/19  Site/dose:   1. The left humerus was treated to a dose of 30 Gy in 10 fractions using an isodose plan. This consisted of 2 fields. 2. The left pelvis was treated to a dose of 30 Gy in 10 fractions using an isodose plan. This consisted of 2 fields.   Narrative: The patient tolerated radiation treatment in general, but his treatment was discontinued at 24 Gy/ 8 fractions to each site above.     Plan: The patient has completed radiation treatment. The patient will return to radiation oncology clinic for routine followup in one month. I advised the patient to call or return sooner if they have any questions or concerns related to their recovery or treatment. ________________________________  Jodelle Gross, M.D., Ph.D.

## 2019-09-30 ENCOUNTER — Ambulatory Visit: Payer: Medicare Other | Admitting: Podiatry

## 2019-12-15 NOTE — Telephone Encounter (Signed)
Encounter opened in error

## 2021-02-03 IMAGING — CT CT ABD-PELV W/O CM
2 of 4 series · 11 of 36 positions shown, 13 images · non-contrast
Comparison: 04/17/2019

CLINICAL DATA: Restaging lung cancer

EXAM:
CT CHEST, ABDOMEN AND PELVIS WITHOUT CONTRAST
TECHNIQUE: Multidetector CT imaging of the chest, abdomen and pelvis was
performed following the standard protocol without IV contrast.

[Series 2: cap w/o · axial · non-contrast · 0.80mm/px · z∈[-664,-89]mm · 8 of 141 slices shown, 10 images]
[im 13/141  mediastinal]
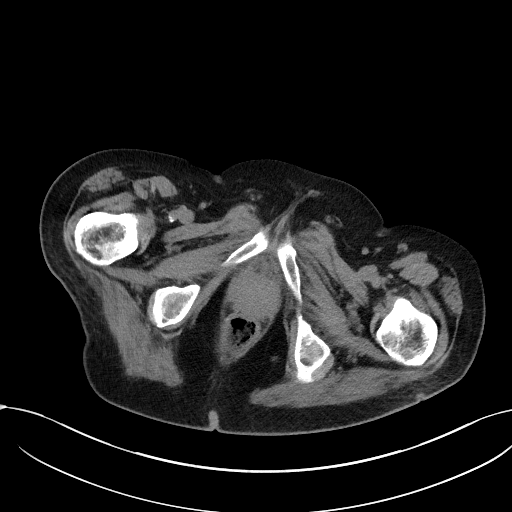
[im 13/141  lung]
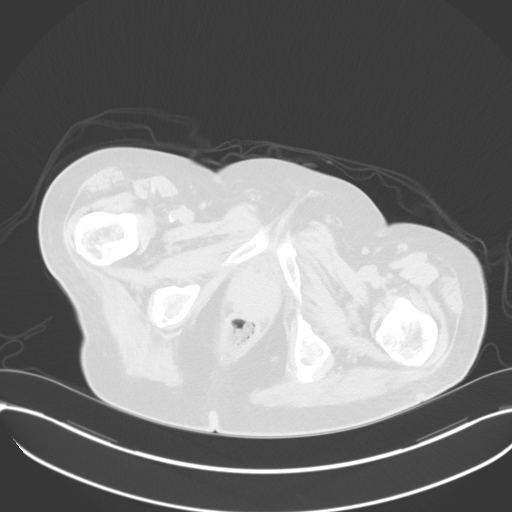
[im 26/141  lung]
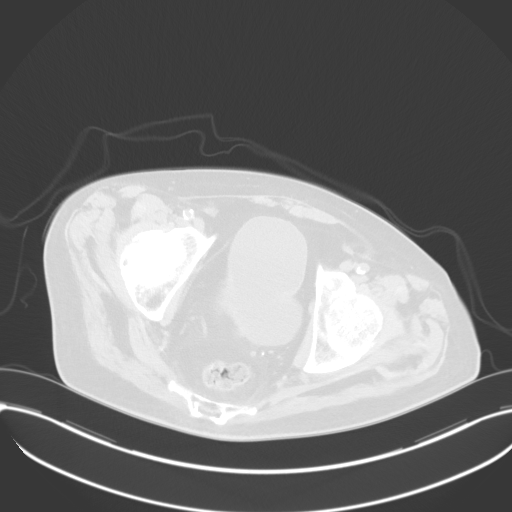
[im 51/141  lung]
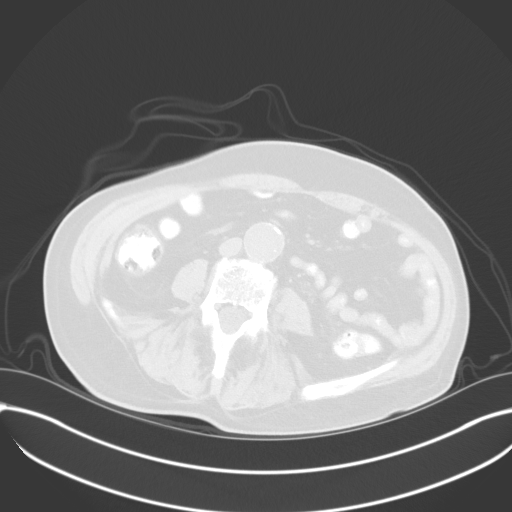
[im 64/141  lung]
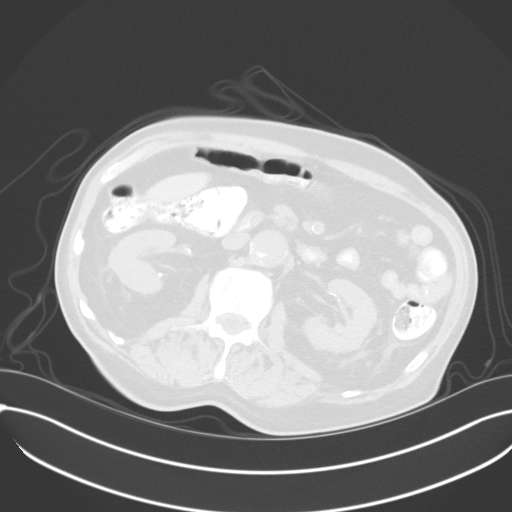
[im 77/141  mediastinal]
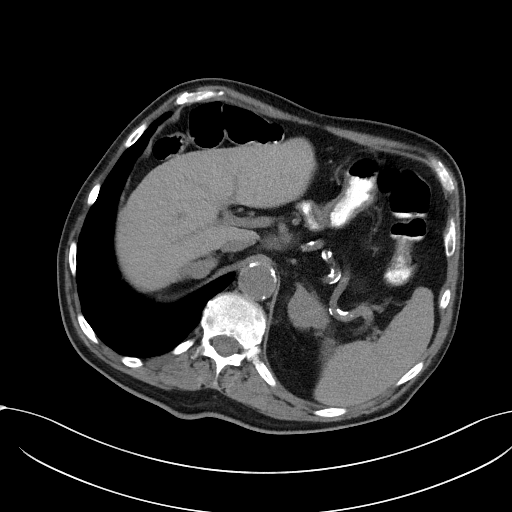
[im 77/141  lung]
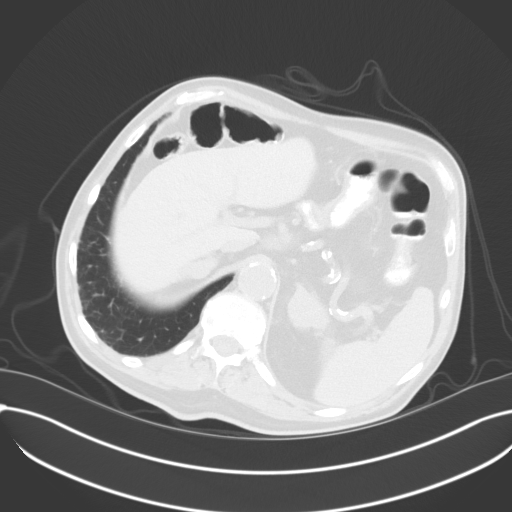
[im 90/141  lung]
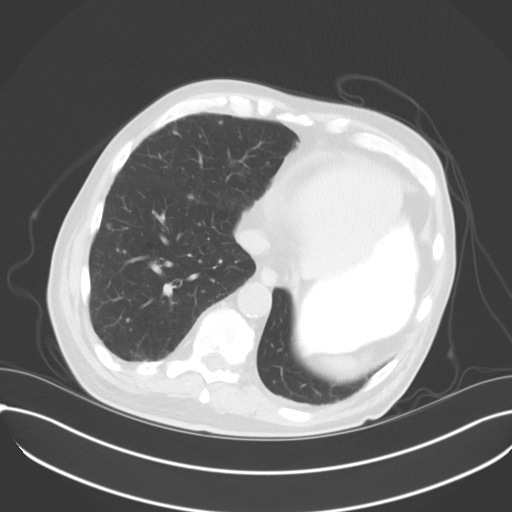
[im 115/141  lung]
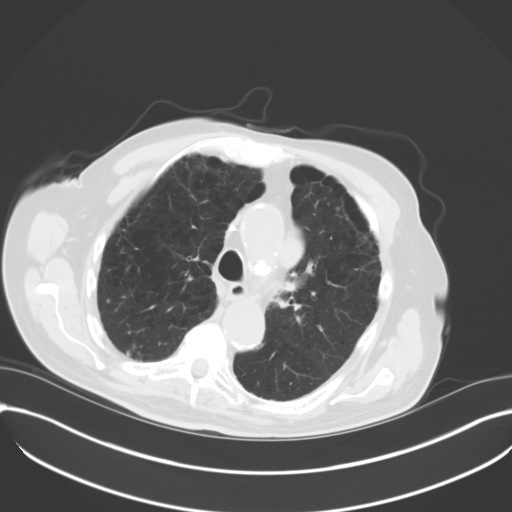
[im 128/141  lung]
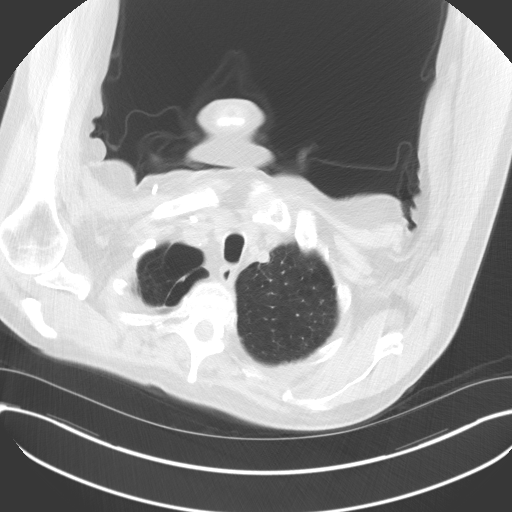

[Series 5: coronals · coronal · 0.79mm/px · 3 of 169 slices shown]
[im 34/169  lung]
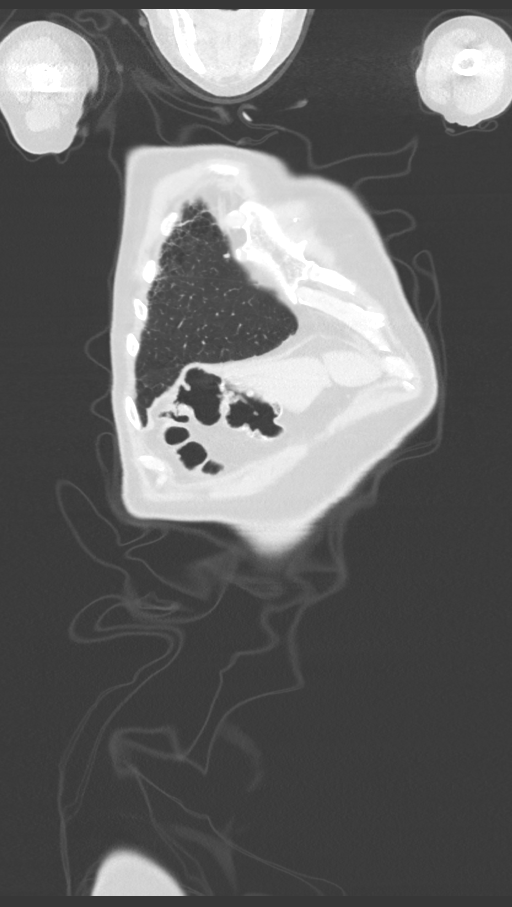
[im 68/169  lung]
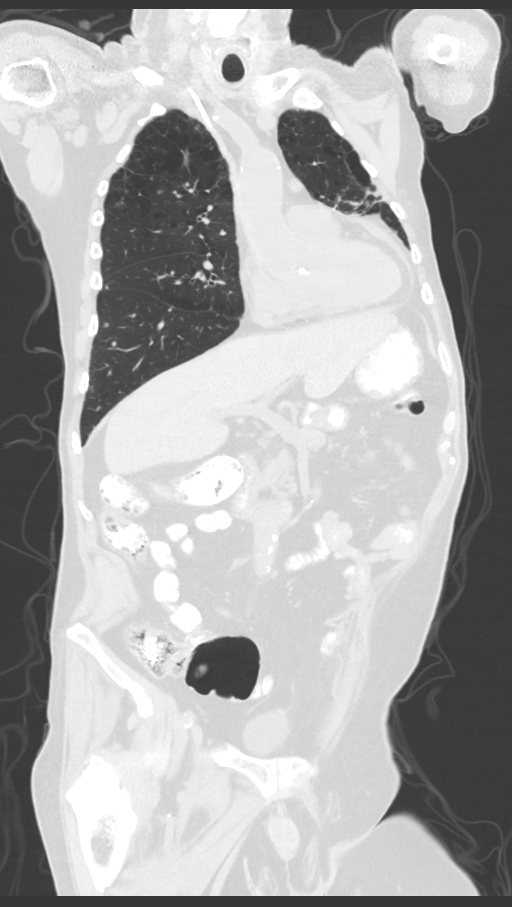
[im 101/169  lung]
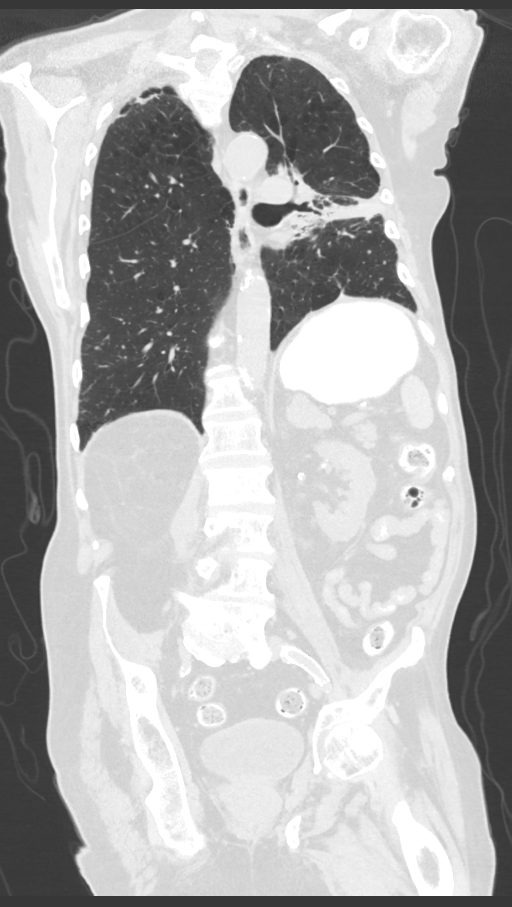

[11 of 36 positions shown; findings below may reference images not displayed]

FINDINGS: CT CHEST FINDINGS

Cardiovascular: RIGHT-sided Port-A-Cath terminating at the caval to
atrial junction. Calcified atheromatous plaque throughout the
thoracic aorta without aneurysmal dilation. Small pericardial
effusion. Similar to prior study. Central pulmonary vasculature is
of normal caliber.

Mediastinum/Nodes: Enlarging mediastinal lymph nodes, pre-vascular
and high AP window lymph nodes (image 28, series [DATE] cm,
previously less than a cm.

(Image 30): 1.4 cm lymph node previously approximately 1 cm no
thoracic inlet adenopathy. No axillary lymphadenopathy. No
subcarinal or gross hilar adenopathy.

Lungs/Pleura: RIGHT apical nodularity along the posterior surface of
the pleura in the RIGHT upper lobe is stable approximately 16 mm
(image 38, series 4)

Since the prior study there is been interval increase in size of
small pulmonary nodules throughout the LEFT and RIGHT chest for
instance, on image 83 of series 4 a nearly 6 mm nodule previously
measured approximately 4 mm.

RIGHT middle lobe nodule (image 113 of series 3 6 mm along the
anterior aspect of the RIGHT middle lobe previously 4-5 mm. These
changes are subtle. These nodules are innumerable.

Post treatment changes in the LEFT chest arising from LEFT hilum
with pleural and parenchymal distortion are associated with
increasing consolidation and soft tissue. A discrete nodule in the
LEFT upper lobe on image 81 of series 3 measuring 6 mm was
previously 3-4 mm on the prior exam. No pleural effusion. Extensive
pulmonary emphysema.

Musculoskeletal: No sign of chest wall mass. See below for full
musculoskeletal details.

CT ABDOMEN PELVIS FINDINGS

Hepatobiliary: Liver is unremarkable on noncontrast imaging.
Gallbladder without pericholecystic stranding. No biliary ductal
dilation grossly on noncontrast imaging.

Pancreas: Pancreas is normal without focal lesion.

Spleen: Spleen normal size without focal lesion.

Adrenals/Urinary Tract: Bilateral adrenal masses 3.8 x 3.0 cm on the
LEFT approximately 3.1 x 2.7 cm previously.

RIGHT adrenal gland measuring 3.4 by 3.1 cm in greatest axial
dimension previously 3.3 x 2.5 cm. The superior aspect of the
infiltrative process in the adrenal gland on image 66 of series 2
has increased in thickness as well measuring approximately 15 mm
greatest thickness as compared to 10 mm. No sign of hydronephrosis.
Urinary bladder is trabeculated.

Stomach/Bowel: No acute bowel process.

Vascular/Lymphatic: The infrarenal abdominal aortic aneurysm shows a
similar appearance measuring 4.6 cm greatest axial dimension. Also
with dilated LEFT common iliac artery with similar appearance. No
adenopathy in the retroperitoneum. No adenopathy in the pelvis.

Reproductive: Prostate mildly enlarged is similar to the prior
study.

Other: No sign of hernia or ascites.

Musculoskeletal: Large LEFT acetabular metastasis extending into the
ischium measuring approximately 3.4 x 2.9 cm greatest axial
dimension previously approximately 3.1 cm, violating the medial
cortex extending towards the obturator muscle on today's study. Some
sclerosis partially visualized in the cervical spine

Sclerosis of the T9 and T11 vertebral bodies with extension into
posterior elements at T9.

On sagittal reconstructions sclerotic areas in the cervical spine
appear more pronounced and at multiple levels though there is
degenerative change in the cervical spine.

L2 sclerosis more conspicuous than on previous imaging.

Marked anterolisthesis of L5 on S1 with pars defects and adjacent
degenerative changes similar to the previous study.
IMPRESSION: 1. Enlarging mediastinal lymph nodes, consistent with worsening
metastatic disease.
2. Small nodules scattered throughout the chest are slightly
increased in size and number compatible with worsening of metastatic
disease in the chest and associated with increasing soft tissue
about the LEFT hilum some of which may be related to post treatment
change but much of which is suspicious for disease particularly on
image 32 of series 2 where lobulated margins along the medial aspect
of consolidative change at the peripheral LEFT chest are noted.
3. Enlarging adrenal masses, consistent with worsening metastatic
disease.
4. Multifocal areas of suspicion in the spine as well and not
mentioned above subtle sclerosis in the LEFT humeral head all
concerning for additional developing sites of disease.
5. Stable 4.4 cm infrarenal abdominal aortic aneurysm.
6. Emphysema and aortic atherosclerosis.
7. Note that LEFT acetabular lesion is at greater risk for
pathologic fracture due to cortical disruption along the medial
margin.

These results will be called to the ordering clinician or
representative by the Radiologist Assistant, and communication
documented in the PACS or [REDACTED].

Aortic Atherosclerosis ([id number]-OVQ.Q) and Emphysema ([id number]-2C2.5).
